# Patient Record
Sex: Female | Born: 1935 | Race: White | Hispanic: No | Smoking: Former smoker
Health system: Southern US, Community
[De-identification: ages and names within clinical notes are randomized; demographics above are authoritative.]

## PROBLEM LIST (undated history)

## (undated) DIAGNOSIS — K219 Gastro-esophageal reflux disease without esophagitis: Secondary | ICD-10-CM

## (undated) DIAGNOSIS — I10 Essential (primary) hypertension: Secondary | ICD-10-CM

## (undated) DIAGNOSIS — J449 Chronic obstructive pulmonary disease, unspecified: Secondary | ICD-10-CM

## (undated) DIAGNOSIS — D649 Anemia, unspecified: Secondary | ICD-10-CM

## (undated) DIAGNOSIS — K625 Hemorrhage of anus and rectum: Secondary | ICD-10-CM

## (undated) DIAGNOSIS — K5792 Diverticulitis of intestine, part unspecified, without perforation or abscess without bleeding: Secondary | ICD-10-CM

## (undated) DIAGNOSIS — E785 Hyperlipidemia, unspecified: Secondary | ICD-10-CM

## (undated) DIAGNOSIS — E611 Iron deficiency: Secondary | ICD-10-CM

## (undated) DIAGNOSIS — L570 Actinic keratosis: Secondary | ICD-10-CM

## (undated) DIAGNOSIS — C4490 Unspecified malignant neoplasm of skin, unspecified: Secondary | ICD-10-CM

## (undated) DIAGNOSIS — H409 Unspecified glaucoma: Secondary | ICD-10-CM

## (undated) HISTORY — PX: JOINT REPLACEMENT: SHX530

## (undated) HISTORY — DX: Unspecified malignant neoplasm of skin, unspecified: C44.90

## (undated) HISTORY — PX: APPENDECTOMY: SHX54

## (undated) HISTORY — PX: ABDOMINAL HYSTERECTOMY: SHX81

## (undated) HISTORY — DX: Actinic keratosis: L57.0

---

## 2010-07-18 DIAGNOSIS — K5730 Diverticulosis of large intestine without perforation or abscess without bleeding: Secondary | ICD-10-CM | POA: Insufficient documentation

## 2010-07-18 DIAGNOSIS — G609 Hereditary and idiopathic neuropathy, unspecified: Secondary | ICD-10-CM | POA: Insufficient documentation

## 2010-07-18 DIAGNOSIS — H8109 Meniere's disease, unspecified ear: Secondary | ICD-10-CM | POA: Insufficient documentation

## 2010-07-18 DIAGNOSIS — K259 Gastric ulcer, unspecified as acute or chronic, without hemorrhage or perforation: Secondary | ICD-10-CM | POA: Insufficient documentation

## 2010-07-18 DIAGNOSIS — K649 Unspecified hemorrhoids: Secondary | ICD-10-CM | POA: Insufficient documentation

## 2010-07-18 DIAGNOSIS — M159 Polyosteoarthritis, unspecified: Secondary | ICD-10-CM | POA: Insufficient documentation

## 2011-02-10 DIAGNOSIS — M797 Fibromyalgia: Secondary | ICD-10-CM | POA: Insufficient documentation

## 2011-04-11 DIAGNOSIS — IMO0002 Reserved for concepts with insufficient information to code with codable children: Secondary | ICD-10-CM | POA: Insufficient documentation

## 2011-07-10 DIAGNOSIS — F3341 Major depressive disorder, recurrent, in partial remission: Secondary | ICD-10-CM | POA: Insufficient documentation

## 2011-11-15 DIAGNOSIS — M2010 Hallux valgus (acquired), unspecified foot: Secondary | ICD-10-CM | POA: Insufficient documentation

## 2011-11-15 DIAGNOSIS — M2040 Other hammer toe(s) (acquired), unspecified foot: Secondary | ICD-10-CM | POA: Insufficient documentation

## 2011-11-15 DIAGNOSIS — M21379 Foot drop, unspecified foot: Secondary | ICD-10-CM | POA: Insufficient documentation

## 2011-11-21 DIAGNOSIS — H3530 Unspecified macular degeneration: Secondary | ICD-10-CM | POA: Insufficient documentation

## 2012-01-18 DIAGNOSIS — M1710 Unilateral primary osteoarthritis, unspecified knee: Secondary | ICD-10-CM | POA: Insufficient documentation

## 2012-01-25 DIAGNOSIS — Z96659 Presence of unspecified artificial knee joint: Secondary | ICD-10-CM | POA: Insufficient documentation

## 2012-01-25 DIAGNOSIS — Z96649 Presence of unspecified artificial hip joint: Secondary | ICD-10-CM | POA: Insufficient documentation

## 2012-02-12 DIAGNOSIS — R5383 Other fatigue: Secondary | ICD-10-CM | POA: Insufficient documentation

## 2012-12-26 DIAGNOSIS — H40009 Preglaucoma, unspecified, unspecified eye: Secondary | ICD-10-CM | POA: Insufficient documentation

## 2015-08-10 ENCOUNTER — Inpatient Hospital Stay
Admission: EM | Admit: 2015-08-10 | Discharge: 2015-08-13 | DRG: 378 | Disposition: A | Discharge status: Home / Self Care | Payer: Medicare Other | Attending: Internal Medicine | Admitting: Internal Medicine

## 2015-08-10 DIAGNOSIS — J449 Chronic obstructive pulmonary disease, unspecified: Secondary | ICD-10-CM | POA: Diagnosis present

## 2015-08-10 DIAGNOSIS — H409 Unspecified glaucoma: Secondary | ICD-10-CM | POA: Diagnosis present

## 2015-08-10 DIAGNOSIS — Z7951 Long term (current) use of inhaled steroids: Secondary | ICD-10-CM

## 2015-08-10 DIAGNOSIS — Z90710 Acquired absence of both cervix and uterus: Secondary | ICD-10-CM

## 2015-08-10 DIAGNOSIS — E785 Hyperlipidemia, unspecified: Secondary | ICD-10-CM | POA: Diagnosis present

## 2015-08-10 DIAGNOSIS — D62 Acute posthemorrhagic anemia: Secondary | ICD-10-CM | POA: Diagnosis present

## 2015-08-10 DIAGNOSIS — I10 Essential (primary) hypertension: Secondary | ICD-10-CM | POA: Diagnosis present

## 2015-08-10 DIAGNOSIS — Z86010 Personal history of colonic polyps: Secondary | ICD-10-CM

## 2015-08-10 DIAGNOSIS — Z79899 Other long term (current) drug therapy: Secondary | ICD-10-CM

## 2015-08-10 DIAGNOSIS — Z9049 Acquired absence of other specified parts of digestive tract: Secondary | ICD-10-CM

## 2015-08-10 DIAGNOSIS — K219 Gastro-esophageal reflux disease without esophagitis: Secondary | ICD-10-CM | POA: Diagnosis present

## 2015-08-10 DIAGNOSIS — Z87891 Personal history of nicotine dependence: Secondary | ICD-10-CM

## 2015-08-10 DIAGNOSIS — K921 Melena: Secondary | ICD-10-CM | POA: Diagnosis not present

## 2015-08-10 DIAGNOSIS — Z888 Allergy status to other drugs, medicaments and biological substances status: Secondary | ICD-10-CM

## 2015-08-10 DIAGNOSIS — E876 Hypokalemia: Secondary | ICD-10-CM | POA: Diagnosis present

## 2015-08-10 DIAGNOSIS — Z886 Allergy status to analgesic agent status: Secondary | ICD-10-CM

## 2015-08-10 DIAGNOSIS — Z8249 Family history of ischemic heart disease and other diseases of the circulatory system: Secondary | ICD-10-CM

## 2015-08-10 DIAGNOSIS — K922 Gastrointestinal hemorrhage, unspecified: Secondary | ICD-10-CM | POA: Diagnosis present

## 2015-08-10 DIAGNOSIS — D500 Iron deficiency anemia secondary to blood loss (chronic): Secondary | ICD-10-CM | POA: Diagnosis present

## 2015-08-10 DIAGNOSIS — D649 Anemia, unspecified: Secondary | ICD-10-CM

## 2015-08-10 DIAGNOSIS — R140 Abdominal distension (gaseous): Secondary | ICD-10-CM

## 2015-08-10 DIAGNOSIS — K319 Disease of stomach and duodenum, unspecified: Secondary | ICD-10-CM | POA: Diagnosis present

## 2015-08-10 DIAGNOSIS — Z8711 Personal history of peptic ulcer disease: Secondary | ICD-10-CM

## 2015-08-10 DIAGNOSIS — Z96641 Presence of right artificial hip joint: Secondary | ICD-10-CM | POA: Diagnosis present

## 2015-08-10 HISTORY — DX: Unspecified glaucoma: H40.9

## 2015-08-10 HISTORY — DX: Hemorrhage of anus and rectum: K62.5

## 2015-08-10 HISTORY — DX: Chronic obstructive pulmonary disease, unspecified: J44.9

## 2015-08-10 HISTORY — DX: Gastro-esophageal reflux disease without esophagitis: K21.9

## 2015-08-10 HISTORY — DX: Hyperlipidemia, unspecified: E78.5

## 2015-08-10 HISTORY — DX: Diverticulitis of intestine, part unspecified, without perforation or abscess without bleeding: K57.92

## 2015-08-10 HISTORY — DX: Essential (primary) hypertension: I10

## 2015-08-10 LAB — PROTIME-INR
INR: 1
Prothrombin Time: 13.2 seconds (ref 11.4–15.2)

## 2015-08-10 LAB — ABO/RH: ABO/RH(D): O NEG

## 2015-08-10 LAB — CBC WITH DIFFERENTIAL/PLATELET
BASOS ABS: 0.3 10*3/uL — AB (ref 0–0.1)
BASOS PCT: 3 %
Eosinophils Absolute: 0.2 10*3/uL (ref 0–0.7)
Eosinophils Relative: 3 %
HEMATOCRIT: 18.7 % — AB (ref 35.0–47.0)
HEMOGLOBIN: 5.9 g/dL — AB (ref 12.0–16.0)
Lymphocytes Relative: 15 %
Lymphs Abs: 1.5 10*3/uL (ref 1.0–3.6)
MCH: 23.9 pg — ABNORMAL LOW (ref 26.0–34.0)
MCHC: 31.8 g/dL — ABNORMAL LOW (ref 32.0–36.0)
MCV: 75.2 fL — ABNORMAL LOW (ref 80.0–100.0)
Monocytes Absolute: 1.3 10*3/uL — ABNORMAL HIGH (ref 0.2–0.9)
Monocytes Relative: 14 %
NEUTROS ABS: 6.5 10*3/uL (ref 1.4–6.5)
NEUTROS PCT: 65 %
Platelets: 329 10*3/uL (ref 150–440)
RBC: 2.49 MIL/uL — ABNORMAL LOW (ref 3.80–5.20)
RDW: 20.7 % — AB (ref 11.5–14.5)
WBC: 9.8 10*3/uL (ref 3.6–11.0)

## 2015-08-10 LAB — COMPREHENSIVE METABOLIC PANEL
ALBUMIN: 3.8 g/dL (ref 3.5–5.0)
ALK PHOS: 37 U/L — AB (ref 38–126)
ALT: 17 U/L (ref 14–54)
AST: 27 U/L (ref 15–41)
Anion gap: 10 (ref 5–15)
BILIRUBIN TOTAL: 0.3 mg/dL (ref 0.3–1.2)
BUN: 33 mg/dL — AB (ref 6–20)
CO2: 26 mmol/L (ref 22–32)
Calcium: 9 mg/dL (ref 8.9–10.3)
Chloride: 100 mmol/L — ABNORMAL LOW (ref 101–111)
Creatinine, Ser: 0.99 mg/dL (ref 0.44–1.00)
GFR calc Af Amer: 57 mL/min — ABNORMAL LOW (ref >60)
GFR calc non Af Amer: 49 mL/min — ABNORMAL LOW (ref >60)
GLUCOSE: 115 mg/dL — AB (ref 65–99)
POTASSIUM: 3.4 mmol/L — AB (ref 3.5–5.1)
Sodium: 136 mmol/L (ref 135–145)
TOTAL PROTEIN: 6.5 g/dL (ref 6.5–8.1)

## 2015-08-10 LAB — PREPARE RBC (CROSSMATCH)

## 2015-08-10 LAB — LIPASE, BLOOD: Lipase: 33 U/L (ref 11–51)

## 2015-08-10 LAB — APTT: APTT: 29 s (ref 24–36)

## 2015-08-10 MED ORDER — SODIUM CHLORIDE 0.9 % IV SOLN
8.0000 mg/h | INTRAVENOUS | Status: DC
  Administered 2015-08-10 – 2015-08-12 (×4): 8 mg/h via INTRAVENOUS
  Filled 2015-08-10 (×5): qty 80

## 2015-08-10 MED ORDER — PANTOPRAZOLE SODIUM 40 MG IV SOLR
80.0000 mg | Freq: Once | INTRAVENOUS | Status: AC
  Administered 2015-08-10: 80 mg via INTRAVENOUS
  Filled 2015-08-10: qty 80

## 2015-08-10 MED ORDER — SODIUM CHLORIDE 0.9 % IV SOLN
10.0000 mL/h | Freq: Once | INTRAVENOUS | Status: AC
  Administered 2015-08-10: 10 mL/h via INTRAVENOUS

## 2015-08-10 MED ORDER — PANTOPRAZOLE SODIUM 40 MG IV SOLR
INTRAVENOUS | Status: AC
  Administered 2015-08-10: 80 mg via INTRAVENOUS
  Filled 2015-08-10: qty 40

## 2015-08-10 MED ORDER — SODIUM CHLORIDE 0.9 % IV BOLUS (SEPSIS)
500.0000 mL | Freq: Once | INTRAVENOUS | Status: AC
  Administered 2015-08-10: 500 mL via INTRAVENOUS

## 2015-08-10 MED ORDER — SODIUM CHLORIDE 0.9 % IV SOLN: 80.0000 mg | Freq: Once | INTRAVENOUS | Status: DC

## 2015-08-10 NOTE — ED Provider Notes (Signed)
[facility] Emergency Department Provider Note   ____________________________________________   First MD Initiated Contact with Patient 08/10/15 2116     (approximate)  I have reviewed the triage vital signs and the nursing notes.   HISTORY  Chief Complaint Rectal Bleeding    HPI [name] [name] is a 82 y.o. female with history of hypertension, COPD, previous GI bleed, known peptic ulcer disease who presents for evaluation of dark tarry stools today, gradual onset, constant, moderate to severe, no modifying factors. She has also had exertional dyspnea and generalized weakness. No abdominal pain, no vomiting or hematemesis. No Fevers or chills. She has required PRBC transfusion in the past for GI bleed. She also requires periodic iron infusions.   Past Medical History:  Diagnosis Date  . COPD (chronic obstructive pulmonary disease) ([place])   . Diverticulitis   . Glaucoma   . Hypertension   . Rectal bleeding     There are no active problems to display for this patient.   Past Surgical History:  Procedure Laterality Date  . ABDOMINAL HYSTERECTOMY    . APPENDECTOMY    . JOINT REPLACEMENT      Prior to Admission medications   Not on File    Allergies Codeine; Dextrans; Fentanyl; Lipitor [atorvastatin]; Mobic [meloxicam]; Niacin and related; Nsaids; Pravachol [pravastatin sodium]; Voltaren [diclofenac sodium]; and Zetia [ezetimibe]  Family History  Problem Relation Age of Onset  . Family history unknown: Yes    Social History Social History  Substance Use Topics  . Smoking status: Former Research scientist (life sciences)  . Smokeless tobacco: Never Used  . Alcohol use No    Review of Systems Constitutional: No fever/chills Eyes: No visual changes. ENT: No sore throat. Cardiovascular: Denies chest pain. Respiratory: Denies shortness of breath. Gastrointestinal: No abdominal pain.  No nausea, no vomiting.  + diarrhea.  No constipation. Genitourinary: Negative  for dysuria. Musculoskeletal: Negative for back pain. Skin: Negative for rash. Neurological: Negative for headaches, focal weakness or numbness.  10-point ROS otherwise negative.  ____________________________________________   PHYSICAL EXAM:  Vitals:   08/10/15 2132  BP: (!) 148/55  Pulse: 77  Resp: (!) 22  Temp: 98.3 F (36.8 C)  TempSrc: Oral  SpO2: 99%  Weight: 175 lb 8 oz (79.6 kg)  Height: 5\' 9"  (1.753 m)     Constitutional: Alert and oriented. Nontoxic but pale- appearing and in no acute distress. Eyes: Conjunctivae are normal. PERRL. EOMI. Head: Atraumatic. Nose: No congestion/rhinnorhea. Mouth/Throat: Mucous membranes are moist.  Oropharynx non-erythematous. Neck: No stridor.  Supple without meningismus. Cardiovascular: Normal rate, regular rhythm. Grossly normal heart sounds.  Good peripheral circulation. Respiratory: Normal respiratory effort.  No retractions. Lungs CTAB. Gastrointestinal: Soft and nontender. No distention.  No CVA tenderness. Genitourinary: deferred Rectal: Melena in the rectal vault is strongly guaiac positive. Musculoskeletal: No lower extremity tenderness nor edema.  No joint effusions. Neurologic:  Normal speech and language. No gross focal neurologic deficits are appreciated. No gait instability. Skin:  Skin is warm, dry and intact. No rash noted. Psychiatric: Mood and affect are normal. Speech and behavior are normal.  ____________________________________________   LABS (all labs ordered are listed, but only abnormal results are displayed)  Labs Reviewed  CBC WITH DIFFERENTIAL/PLATELET - Abnormal; Notable for the following:       Result Value   RBC 2.49 (*)    Hemoglobin 5.9 (*)    HCT 18.7 (*)    MCV 75.2 (*)    MCH 23.9 (*)    MCHC  31.8 (*)    RDW 20.7 (*)    Monocytes Absolute 1.3 (*)    Basophils Absolute 0.3 (*)    All other components within normal limits  PROTIME-INR  APTT  COMPREHENSIVE METABOLIC PANEL  LIPASE,  BLOOD  TYPE AND SCREEN  PREPARE RBC (CROSSMATCH)  ABO/RH   ____________________________________________  EKG  ED ECG REPORT I, Joanne Gavel, the attending physician, personally viewed and interpreted this ECG.   Date: 08/10/2015  EKG Time: 22:32  Rate: 89  Rhythm: normal EKG, normal sinus rhythm  Axis: normal  Intervals:none  ST&T Change: No acute ST elevation or acute ST depression  ____________________________________________  RADIOLOGY  none ____________________________________________   PROCEDURES  Procedure(s) performed: None  Procedures  Critical Care performed: Yes, see critical care note(s).  CRITICAL CARE Performed by: Loura Pardon A   Total critical care time: 35 minutes  Critical care time was exclusive of separately billable procedures and treating other patients.  Critical care was necessary to treat or prevent imminent or life-threatening deterioration.  Critical care was time spent personally by me on the following activities: development of treatment plan with patient and/or surrogate as well as nursing, discussions with consultants, evaluation of patient's response to treatment, examination of patient, obtaining history from patient or surrogate, ordering and performing treatments and interventions, ordering and review of laboratory studies, ordering and review of radiographic studies, pulse oximetry and re-evaluation of patient's condition.  ____________________________________________   INITIAL IMPRESSION / ASSESSMENT AND PLAN / ED COURSE  Pertinent labs & imaging results that were available during my care of the patient were reviewed by me and considered in my medical decision making (see chart for details).  [name] [name] is a 82 y.o. female with history of hypertension, COPD, previous GI bleed, known peptic ulcer disease who presents for evaluation of dark tarry stools today concerning for upper GI bleed, history of same. On exam, she  is nontoxic appearing and in no acute distress though she does have marked skin pallor. Her vital signs are stable, she is afebrile. She has a benign abdominal examination, melena in the rectal vault is strongly guaiac positive. Hemoglobin 5.9, patient has been consented for blood and will receive 2 units of PRBCs. We discussed risks and benefits and she has signed consent form, she wishes to move forward with blood transfusion. IV Protonix bolus and drip initiated, will discuss the case with the hospitalist for admission.  Clinical Course     ____________________________________________   FINAL CLINICAL IMPRESSION(S) / ED DIAGNOSES  Final diagnoses:  Symptomatic anemia  Acute upper GI bleed      NEW MEDICATIONS STARTED DURING THIS VISIT:  New Prescriptions   No medications on file     Note:  This document was prepared using Dragon voice recognition software and may include unintentional dictation errors.    Joanne Gavel, MD 08/10/15 [phone]

## 2015-08-10 NOTE — ED Triage Notes (Signed)
Pt to ED c/o rectal bleeding. Per EMS pt reports 3 episodes of rectal bleeding in the past week with 2 episodes today. Pt reports black tarry stools and a hx of rectal bleeding. Pt denies pain as well as NVD, but reports dizziness and weakness.

## 2015-08-11 ENCOUNTER — Inpatient Hospital Stay: Payer: Medicare Other

## 2015-08-11 DIAGNOSIS — Z888 Allergy status to other drugs, medicaments and biological substances status: Secondary | ICD-10-CM | POA: Diagnosis not present

## 2015-08-11 DIAGNOSIS — Z90710 Acquired absence of both cervix and uterus: Secondary | ICD-10-CM | POA: Diagnosis not present

## 2015-08-11 DIAGNOSIS — D500 Iron deficiency anemia secondary to blood loss (chronic): Secondary | ICD-10-CM | POA: Diagnosis present

## 2015-08-11 DIAGNOSIS — Z8249 Family history of ischemic heart disease and other diseases of the circulatory system: Secondary | ICD-10-CM | POA: Diagnosis not present

## 2015-08-11 DIAGNOSIS — K219 Gastro-esophageal reflux disease without esophagitis: Secondary | ICD-10-CM | POA: Diagnosis present

## 2015-08-11 DIAGNOSIS — K921 Melena: Secondary | ICD-10-CM | POA: Diagnosis present

## 2015-08-11 DIAGNOSIS — K319 Disease of stomach and duodenum, unspecified: Secondary | ICD-10-CM | POA: Diagnosis present

## 2015-08-11 DIAGNOSIS — Z96641 Presence of right artificial hip joint: Secondary | ICD-10-CM | POA: Diagnosis present

## 2015-08-11 DIAGNOSIS — Z7951 Long term (current) use of inhaled steroids: Secondary | ICD-10-CM | POA: Diagnosis not present

## 2015-08-11 DIAGNOSIS — Z886 Allergy status to analgesic agent status: Secondary | ICD-10-CM | POA: Diagnosis not present

## 2015-08-11 DIAGNOSIS — J449 Chronic obstructive pulmonary disease, unspecified: Secondary | ICD-10-CM | POA: Diagnosis present

## 2015-08-11 DIAGNOSIS — Z87891 Personal history of nicotine dependence: Secondary | ICD-10-CM | POA: Diagnosis not present

## 2015-08-11 DIAGNOSIS — E785 Hyperlipidemia, unspecified: Secondary | ICD-10-CM | POA: Diagnosis present

## 2015-08-11 DIAGNOSIS — D62 Acute posthemorrhagic anemia: Secondary | ICD-10-CM | POA: Diagnosis present

## 2015-08-11 DIAGNOSIS — H409 Unspecified glaucoma: Secondary | ICD-10-CM | POA: Diagnosis present

## 2015-08-11 DIAGNOSIS — Z79899 Other long term (current) drug therapy: Secondary | ICD-10-CM | POA: Diagnosis not present

## 2015-08-11 DIAGNOSIS — Z9049 Acquired absence of other specified parts of digestive tract: Secondary | ICD-10-CM | POA: Diagnosis not present

## 2015-08-11 DIAGNOSIS — E876 Hypokalemia: Secondary | ICD-10-CM | POA: Diagnosis present

## 2015-08-11 DIAGNOSIS — Z86010 Personal history of colonic polyps: Secondary | ICD-10-CM | POA: Diagnosis not present

## 2015-08-11 DIAGNOSIS — I10 Essential (primary) hypertension: Secondary | ICD-10-CM | POA: Diagnosis present

## 2015-08-11 DIAGNOSIS — K922 Gastrointestinal hemorrhage, unspecified: Secondary | ICD-10-CM | POA: Diagnosis present

## 2015-08-11 DIAGNOSIS — Z8711 Personal history of peptic ulcer disease: Secondary | ICD-10-CM | POA: Diagnosis not present

## 2015-08-11 LAB — BASIC METABOLIC PANEL
Anion gap: 8 (ref 5–15)
BUN: 32 mg/dL — AB (ref 6–20)
CALCIUM: 8.3 mg/dL — AB (ref 8.9–10.3)
CO2: 26 mmol/L (ref 22–32)
CREATININE: 0.72 mg/dL (ref 0.44–1.00)
Chloride: 103 mmol/L (ref 101–111)
GFR calc non Af Amer: >60 mL/min (ref >60)
GLUCOSE: 95 mg/dL (ref 65–99)
Potassium: 3.3 mmol/L — ABNORMAL LOW (ref 3.5–5.1)
Sodium: 137 mmol/L (ref 135–145)

## 2015-08-11 LAB — HEMOGLOBIN
Hemoglobin: 8 g/dL — ABNORMAL LOW (ref 12.0–16.0)
Hemoglobin: 8.6 g/dL — ABNORMAL LOW (ref 12.0–16.0)

## 2015-08-11 LAB — IRON AND TIBC
Iron: 17 ug/dL — ABNORMAL LOW (ref 28–170)
SATURATION RATIOS: 4 % — AB (ref 10.4–31.8)
TIBC: 484 ug/dL — ABNORMAL HIGH (ref 250–450)
UIBC: 467 ug/dL

## 2015-08-11 LAB — CBC
HEMATOCRIT: 24.2 % — AB (ref 35.0–47.0)
HEMOGLOBIN: 8.3 g/dL — AB (ref 12.0–16.0)
MCH: 26.1 pg (ref 26.0–34.0)
MCHC: 34.3 g/dL (ref 32.0–36.0)
MCV: 76 fL — AB (ref 80.0–100.0)
PLATELETS: 282 10*3/uL (ref 150–440)
RBC: 3.18 MIL/uL — ABNORMAL LOW (ref 3.80–5.20)
RDW: 19.4 % — ABNORMAL HIGH (ref 11.5–14.5)
WBC: 10 10*3/uL (ref 3.6–11.0)

## 2015-08-11 LAB — FERRITIN: Ferritin: 13 ng/mL (ref 11–307)

## 2015-08-11 MED ORDER — PAROXETINE HCL 20 MG PO TABS
40.0000 mg | ORAL_TABLET | ORAL | Status: DC
  Administered 2015-08-11 – 2015-08-13 (×3): 40 mg via ORAL
  Filled 2015-08-11 (×2): qty 4
  Filled 2015-08-11: qty 2
  Filled 2015-08-11: qty 4

## 2015-08-11 MED ORDER — FLUTICASONE PROPIONATE 50 MCG/ACT NA SUSP: 2.0000 | Freq: Every day | NASAL | Status: DC | PRN

## 2015-08-11 MED ORDER — MORPHINE SULFATE (PF) 2 MG/ML IV SOLN
INTRAVENOUS | Status: AC
  Filled 2015-08-11: qty 1

## 2015-08-11 MED ORDER — TRAMADOL HCL 50 MG PO TABS
50.0000 mg | ORAL_TABLET | Freq: Four times a day (QID) | ORAL | Status: DC | PRN
  Administered 2015-08-11: 04:00:00 50 mg via ORAL
  Filled 2015-08-11: qty 1

## 2015-08-11 MED ORDER — ACETAMINOPHEN 500 MG PO TABS
1000.0000 mg | ORAL_TABLET | Freq: Four times a day (QID) | ORAL | Status: DC | PRN
  Administered 2015-08-11: 11:00:00 1000 mg via ORAL
  Filled 2015-08-11: qty 2

## 2015-08-11 MED ORDER — TIMOLOL MALEATE 0.5 % OP SOLG
1.0000 [drp] | Freq: Every day | OPHTHALMIC | Status: DC
  Administered 2015-08-11 – 2015-08-13 (×3): 1 [drp] via OPHTHALMIC
  Filled 2015-08-11: qty 5

## 2015-08-11 MED ORDER — ALBUTEROL SULFATE (2.5 MG/3ML) 0.083% IN NEBU: 3.0000 mL | INHALATION_SOLUTION | RESPIRATORY_TRACT | Status: DC | PRN

## 2015-08-11 MED ORDER — ONDANSETRON HCL 4 MG/2ML IJ SOLN: 4.0000 mg | Freq: Four times a day (QID) | INTRAMUSCULAR | Status: DC | PRN

## 2015-08-11 MED ORDER — ONDANSETRON HCL 4 MG PO TABS: 4.0000 mg | ORAL_TABLET | Freq: Four times a day (QID) | ORAL | Status: DC | PRN

## 2015-08-11 MED ORDER — SIMETHICONE 80 MG PO CHEW
80.0000 mg | CHEWABLE_TABLET | Freq: Four times a day (QID) | ORAL | Status: DC
  Administered 2015-08-11 – 2015-08-13 (×8): 80 mg via ORAL
  Filled 2015-08-11 (×8): qty 1

## 2015-08-11 MED ORDER — SODIUM CHLORIDE 0.9 % IV SOLN
INTRAVENOUS | Status: AC
  Administered 2015-08-11: 05:00:00 via INTRAVENOUS

## 2015-08-11 MED ORDER — GABAPENTIN 300 MG PO CAPS
300.0000 mg | ORAL_CAPSULE | Freq: Two times a day (BID) | ORAL | Status: DC
  Administered 2015-08-11 – 2015-08-13 (×5): 300 mg via ORAL
  Filled 2015-08-11 (×5): qty 1

## 2015-08-11 MED ORDER — ACETAMINOPHEN 325 MG PO TABS
ORAL_TABLET | ORAL | Status: AC
  Administered 2015-08-11: 650 mg via ORAL
  Filled 2015-08-11: qty 2

## 2015-08-11 MED ORDER — ACETAMINOPHEN 650 MG RE SUPP: 650.0000 mg | Freq: Four times a day (QID) | RECTAL | Status: DC | PRN

## 2015-08-11 MED ORDER — POTASSIUM CHLORIDE CRYS ER 20 MEQ PO TBCR
40.0000 meq | EXTENDED_RELEASE_TABLET | Freq: Once | ORAL | Status: AC
  Administered 2015-08-11: 11:00:00 40 meq via ORAL
  Filled 2015-08-11: qty 2

## 2015-08-11 MED ORDER — ACETAMINOPHEN 325 MG PO TABS
650.0000 mg | ORAL_TABLET | Freq: Four times a day (QID) | ORAL | Status: DC | PRN
  Administered 2015-08-11: 650 mg via ORAL

## 2015-08-11 MED ORDER — MORPHINE SULFATE (PF) 2 MG/ML IV SOLN: 2.0000 mg | Freq: Once | INTRAVENOUS | Status: DC

## 2015-08-11 MED ORDER — SODIUM CHLORIDE 0.9% FLUSH
3.0000 mL | Freq: Two times a day (BID) | INTRAVENOUS | Status: DC
  Administered 2015-08-11 – 2015-08-13 (×6): 3 mL via INTRAVENOUS

## 2015-08-11 NOTE — Progress Notes (Signed)
[place] at [facility]                                                                                                                                                                                            Patient Demographics   [name] [name], is a 82 y.o. female, DOB - 04/17/1925, [id number]  Admit date - 08/10/2015   Admitting Physician Lance Coon, MD  Outpatient Primary MD for the patient is [place], [place], DO   LOS - 0  Subjective:Patient admitted with GI bleed with melena. She reports that she has not had any further bleeding since admission. Does complaint that her abdomen is distended and feels gaseous. Hemoglobin has improved past transfusion     Review of Systems:   CONSTITUTIONAL: No documented fever. No fatigue, weakness. No weight gain, no weight loss.  EYES: No blurry or double vision.  ENT: No tinnitus. No postnasal drip. No redness of the oropharynx.  RESPIRATORY: No cough, no wheeze, no hemoptysis. No dyspnea.  CARDIOVASCULAR: No chest pain. No orthopnea. No palpitations. No syncope.  GASTROINTESTINAL: No nausea, no vomiting or diarrhea. No abdominal pain. Positive melena no hematochezia. Positive abdominal distention GENITOURINARY: No dysuria or hematuria.  ENDOCRINE: No polyuria or nocturia. No heat or cold intolerance.  HEMATOLOGY: No anemia. No bruising. No bleeding.  INTEGUMENTARY: No rashes. No lesions.  MUSCULOSKELETAL: No arthritis. No swelling. No gout.  NEUROLOGIC: No numbness, tingling, or ataxia. No seizure-type activity.  PSYCHIATRIC: No anxiety. No insomnia. No ADD.    Vitals:   Vitals:   08/11/15 0442 08/11/15 0527 08/11/15 0535 08/11/15 0832  BP: (!) 155/65 (!) 173/61 (!) 157/69 (!) 149/59  Pulse: 81 72 78 77  Resp: 17 15  16   Temp: 98.2 F (36.8 C) 97.6 F (36.4 C)  97.9 F (36.6 C)  TempSrc: Oral Oral  Oral  SpO2: 98% 99% 96% 96%  Weight:      Height:        Wt Readings from  Last 3 Encounters:  08/11/15 85.8 kg (189 lb 3.2 oz)     Intake/Output Summary (Last 24 hours) at 08/11/15 1323 Last data filed at 08/11/15 1317  Gross per 24 hour  Intake           1584.5 ml  Output                0 ml  Net           1584.5 ml    Physical Exam:   GENERAL: Pleasant-appearing in no apparent distress.  HEAD, EYES, EARS, NOSE AND THROAT: Atraumatic, normocephalic. Extraocular  muscles are intact. Pupils equal and reactive to light. Sclerae anicteric. No conjunctival injection. No oro-pharyngeal erythema.  NECK: Supple. There is no jugular venous distention. No bruits, no lymphadenopathy, no thyromegaly.  HEART: Regular rate and rhythm,. No murmurs, no rubs, no clicks.  LUNGS: Clear to auscultation bilaterally. No rales or rhonchi. No wheezes.  ABDOMEN: Soft, mild distention, nondistended. Has good bowel sounds. No hepatosplenomegaly appreciated.  EXTREMITIES: No evidence of any cyanosis, clubbing, or peripheral edema.  +2 pedal and radial pulses bilaterally.  NEUROLOGIC: The patient is alert, awake, and oriented x3 with no focal motor or sensory deficits appreciated bilaterally.  SKIN: Moist and warm with no rashes appreciated.  Psych: Not anxious, depressed LN: No inguinal LN enlargement    Antibiotics   Anti-infectives    None      Medications   Scheduled Meds: . PARoxetine  40 mg Oral [id number]  . simethicone  80 mg Oral QID  . sodium chloride flush  3 mL Intravenous Q12H  . timolol  1 drop Both Eyes Daily   Continuous Infusions: . pantoprozole (PROTONIX) infusion 8 mg/hr (08/11/15 0847)   PRN Meds:.[DISCONTINUED] acetaminophen **OR** acetaminophen, acetaminophen, albuterol, ondansetron **OR** ondansetron (ZOFRAN) IV, traMADol   Data Review:   Micro Results No results found for this or any previous visit (from the past 240 hour(s)).  Radiology Reports Dg Abd 2 Views  Result Date: 08/11/2015 CLINICAL DATA:  Melena EXAM: ABDOMEN - 2 VIEW COMPARISON:   None. FINDINGS: Scattered large and small bowel gas noted. Degenerative changes of the lumbar spine are seen with a scoliosis concave to the right. Right hip replacement is noted. Degenerative changes of the left hip joint is seen. No free air is noted. IMPRESSION: No acute abnormality noted. Electronically Signed   By: Inez Catalina M.D.   On: 08/11/2015 11:17     CBC  Recent Labs Lab 08/10/15 2122 08/11/15 0709  WBC 9.8 10.0  HGB 5.9* 8.3*  HCT 18.7* 24.2*  PLT 329 282  MCV 75.2* 76.0*  MCH 23.9* 26.1  MCHC 31.8* 34.3  RDW 20.7* 19.4*  LYMPHSABS 1.5  --   MONOABS 1.3*  --   EOSABS 0.2  --   BASOSABS 0.3*  --     Chemistries   Recent Labs Lab 08/10/15 2122 08/11/15 0709  NA 136 137  K 3.4* 3.3*  CL 100* 103  CO2 26 26  GLUCOSE 115* 95  BUN 33* 32*  CREATININE 0.99 0.72  CALCIUM 9.0 8.3*  AST 27  --   ALT 17  --   ALKPHOS 37*  --   BILITOT 0.3  --    ------------------------------------------------------------------------------------------------------------------ estimated creatinine clearance is 56.8 mL/min (by C-G formula based on SCr of 0.8 mg/dL). ------------------------------------------------------------------------------------------------------------------ No results for input(s): HGBA1C in the last 72 hours. ------------------------------------------------------------------------------------------------------------------ No results for input(s): CHOL, HDL, LDLCALC, TRIG, CHOLHDL, LDLDIRECT in the last 72 hours. ------------------------------------------------------------------------------------------------------------------ No results for input(s): TSH, T4TOTAL, T3FREE, THYROIDAB in the last 72 hours.  Invalid input(s): FREET3 ------------------------------------------------------------------------------------------------------------------ No results for input(s): VITAMINB12, FOLATE, FERRITIN, TIBC, IRON, RETICCTPCT in the last 72 hours.  Coagulation  profile  Recent Labs Lab 08/10/15 2122  INR 1.00    No results for input(s): DDIMER in the last 72 hours.  Cardiac Enzymes No results for input(s): CKMB, TROPONINI, MYOGLOBIN in the last 168 hours.  Invalid input(s): CK ------------------------------------------------------------------------------------------------------------------ Invalid input(s): [place]   Patient is a 82 year old with history of GI bleed in the past  presents with upper GI bleed  1. upper  GI bleed -  Continue IV Protonix drip Hemoglobin is currently stable we'll monitor again transfuse if needed  2. Abdominal distention I will order abdominal x-ray to make sure that she does not have obstruction  3. Hypokalemia I will give her a dose of potassium Pete potassium levels in the morning  4. Acute blood loss anemia status post transfusion  5. Essential   HTN (hypertension) - currently normotensive, low pressure medications being mild   6.   COPD (chronic obstructive pulmonary disease) ([place]) - continue home inhalers no evidence of exasperation  7.  HLD (hyperlipidemia) -  add on any therapy continue to monitor  8.  GERD (gastroesophageal reflux disease) - PPI as above      Code Status Orders        Start     Ordered   08/11/15 0142  Full code  Continuous     08/11/15 0141    Code Status History    Date Active Date Inactive Code Status Order ID Comments User Context   This patient has a current code status but no historical code status.    Advance Directive Documentation   Flowsheet Row Most Recent Value  Type of Advance Directive  Healthcare Power of Attorney, Living will  Pre-existing out of facility DNR order (yellow form or pink MOST form)  No data  "MOST" Form in Place?  No data           ConsultsGastroenterology   DVT Prophylaxis  SCDs  Lab Results  Component Value Date   PLT 282 08/11/2015     Time Spent in minutes 76min  Greater than 50% of  time spent in care coordination and counseling patient regarding the condition and plan of care.   Dustin Flock M.D on 08/11/2015 at 1:23 PM  Between 7am to 6pm - Pager - [phone]  After 6pm go to [email or web address] - password [email or web address] [facility]   Office  [phone]

## 2015-08-11 NOTE — Progress Notes (Signed)
Pt doing well not stools today thus far. Up with physical therapy. Tolerating clear liquids. Complains of chronic back pain otherwise which she takes tylenol for with good relief, otherwise comfortable. Plan to discharge tomorrow.

## 2015-08-11 NOTE — Care Management (Signed)
Admitted to [facility] with the diagnosis of GI bleed. Lives at [facility] x 2 years. Prior to [facility] lived in [place]. Last seen Dr. Bobette Mo at [facility] 2 months ago. Daughter is Clarise Cruz [phone]). No home Health. No skilled facility. No home oxygen. Uses a cane to aid in ambulation. [place] last Spring down on one knee in the yard. Appetite too good. Takes care of all basic and instrumental activities of daily living herself, drives. Prescriptions are filled at [facility]. Son-in-law Shanon Brow will transport. Shelbie Ammons RN MSN CCM Care Management [phone]

## 2015-08-11 NOTE — Care Management Important Message (Signed)
Important Message  Patient Details  Name: Jhanvi Szuch MRN: [id number] Date of Birth: 1925-01-26   Medicare Important Message Given:       Shelbie Ammons, RN 08/11/2015, 8:29 AM

## 2015-08-11 NOTE — Consult Note (Signed)
Consultation  Referring Provider:     Dr Posey Pronto Admit date: 08/11/15 Consult date: 08/11/15         Reason for Consultation:   Melena        HPI:   [name] [name] is a 82 y.o. female with history of IDA, COPD, GERD, HLD, htn who was admitted with dark tarry stools and symptomatic anemia (DOE/weakness). She is not on chronic anticoagulants. There has been some epigastric pain reported.  Hgb found to be 5.9  and was found to be heme + on rectal exam in ED. Bun was elevated with normal creatinine. Pt/INR 13.2/1.00. Started on Pantoprazole gtt and received 2u PRBC transfusion with hgb now 8.3. 2 view abdominal films today unremarkable.  Patient reports a long history of IDA and regular Fe transfusions: follows with Dr Charyl Dancer at [facility]. IDA improved over the winter however, had hgb drop form 12 to 7 this summer: had IV Fe last about 3w ago. States she saw a black stool about a week ago but this did not recur until 2d ago and was associated with epigastric pain and abdominal bloating. Felt her symptoms were consistent with the ulcer she had in the past, which was related to NSAIDS, which she states she no longer takes. Seems to think that this is a chronic ulcer, however last EGD 4/16 as below. Denies all further GI complaints. States she is feeling better today after transfusions and has no further pain ,however still feels malaised. Is on PPI at home. States she has not had any further melena since yesterday and has not moved her bowels today. She is hemodynamically stable.    Follows with [facility]: sees Dr Rosanne Sack, last 9/16- a capsule study was discussed but both patient and provider decided not to do it as the chances of it finding something significant was small and she did not want to undergo the risk, and did not want to have any operation.  She was going to follow up prn. Endoscopies as below:             CT colonography 05/22/14 with diverticulosis to the descending and sigmoid colon. There  were no polyps os masses. There were some mildly enlarged mesenteric root lymphnodes, which was noted to be nonspecific. EGD 04/16/14 done by Dr Bayard Hugger at [facility] with some mild gastric erosions, small hiatal hernia, normal esohagus and duodenum.  Duodenal biopsies negative, gastric biopsies with foveolar hyperplasia: there was no gastritis/h pylori/dysplasia/malignancy. Colonoscopy 12/ 2012 (Dr Boyd Kerbs): passed only to the sigmoid colon due to anatomical issues. There were several diverticula and no polyps. EGD 12/2010(Dr Genezcko):Esophagus, stomach, and duodenum all noted to be normal.   EGD 2006 (Dr Rosanne Sack): single gastric ulcer, 45mm in the prepyloric area. Esophagus and duodenum normal. There was a note that she was treated for H pylori at some point around this however biopsies of this ulcer were negative for H pylori, dysplasia, malignancy.  Negative celiac test 2006. Colonoscopy 2004 (Dr Rosanne Sack) with diverticulosis Colonoscopy 1995 (Dr Geoffry Paradise) with a hyperplastic polyp.   Past Medical History:  Diagnosis Date  . COPD (chronic obstructive pulmonary disease) ([place])   . Diverticulitis   . GERD (gastroesophageal reflux disease)   . Glaucoma   . HLD (hyperlipidemia)   . Hypertension   . Rectal bleeding     Past Surgical History:  Procedure Laterality Date  . ABDOMINAL HYSTERECTOMY    . APPENDECTOMY    . JOINT REPLACEMENT      Family History  Problem Relation Age of Onset  . CAD Mother   . CAD Father   . CAD Brother    son with colon cancer; no family history liver disease/ulcers.  Social History  Substance Use Topics  . Smoking status: Former Research scientist (life sciences)  . Smokeless tobacco: Never Used  . Alcohol use No    Prior to Admission medications   Medication Sig Start Date End Date Taking? Authorizing Provider  acetaminophen (TYLENOL) 500 MG tablet Take 500 mg by mouth every 6 (six) hours as needed.   Yes Historical Provider, MD  albuterol (PROVENTIL HFA;VENTOLIN HFA) 108 (90 Base)  MCG/ACT inhaler Inhale 2 puffs into the lungs every 4 (four) hours as needed for wheezing or shortness of breath.   Yes Historical Provider, MD  chlorthalidone (HYGROTON) 25 MG tablet Take 12.5 mg by mouth daily.   Yes Historical Provider, MD  cholecalciferol (VITAMIN D) 1000 units tablet Take 1,000 Units by mouth 2 (two) times daily.   Yes Historical Provider, MD  cycloSPORINE (RESTASIS) 0.05 % ophthalmic emulsion Place 1 drop into both eyes 2 (two) times daily.   Yes Historical Provider, MD  fluticasone (FLONASE) 50 MCG/ACT nasal spray Place 2 sprays into both nostrils daily as needed for allergies or rhinitis.   Yes Historical Provider, MD  gabapentin (NEURONTIN) 300 MG capsule Take 300 mg by mouth 2 (two) times daily.   Yes Historical Provider, MD  Javier Docker Oil 500 MG CAPS Take 500 mg by mouth daily.   Yes Historical Provider, MD  losartan (COZAAR) 50 MG tablet Take 50 mg by mouth daily.   Yes Historical Provider, MD  mometasone (ELOCON) 0.1 % lotion Apply 1 application topically daily as needed (rash).   Yes Historical Provider, MD  Multiple Vitamin (MULTIVITAMIN WITH MINERALS) TABS tablet Take 1 tablet by mouth daily.   Yes Historical Provider, MD  Multiple Vitamins-Minerals (OCUVITE PRESERVISION PO) Take 1 tablet by mouth 2 (two) times daily.   Yes Historical Provider, MD  nystatin (NYSTATIN) powder Apply 1 g topically 2 (two) times daily as needed (rash).   Yes Historical Provider, MD  omeprazole (PRILOSEC) 40 MG capsule Take 40 mg by mouth daily.   Yes Historical Provider, MD  PARoxetine (PAXIL) 40 MG tablet Take 40 mg by mouth every morning.   Yes Historical Provider, MD  Polyethyl Glycol-Propyl Glycol (SYSTANE) 0.4-0.3 % SOLN Apply 1 drop to eye as needed.   Yes Historical Provider, MD  potassium chloride (K-DUR,KLOR-CON) 10 MEQ tablet Take 10 mEq by mouth 2 (two) times daily.   Yes Historical Provider, MD  timolol (TIMOPTIC-XR) 0.5 % ophthalmic gel-forming Place 1 drop into both eyes daily.    Yes Historical Provider, MD  acetaminophen (TYLENOL) 500 MG tablet Take 500-1,000 mg by mouth every 6 (six) hours as needed for mild pain.    Historical Provider, MD  valACYclovir (VALTREX) 1000 MG tablet Take 1,000 mg by mouth daily as needed (fever blister).    Historical Provider, MD    Current Facility-Administered Medications  Medication Dose Route Frequency Provider Last Rate Last Dose  . acetaminophen (TYLENOL) suppository 650 mg  650 mg Rectal Q6H PRN Lance Coon, MD      . acetaminophen (TYLENOL) tablet 1,000 mg  1,000 mg Oral Q6H PRN Harrie Foreman, MD   1,000 mg at 08/11/15 1115  . albuterol (PROVENTIL) (2.5 MG/3ML) 0.083% nebulizer solution 3 mL  3 mL Inhalation Q4H PRN Lance Coon, MD      . ondansetron [facility]) tablet 4 mg  4 mg Oral Q6H PRN Lance Coon, MD       Or  . ondansetron [facility]) injection 4 mg  4 mg Intravenous Q6H PRN Lance Coon, MD      . pantoprazole (PROTONIX) 80 mg in sodium chloride 0.9 % 250 mL (0.32 mg/mL) infusion  8 mg/hr Intravenous Continuous Joanne Gavel, MD 25 mL/hr at 08/11/15 0847 8 mg/hr at 08/11/15 0847  . PARoxetine (PAXIL) tablet 40 mg  40 mg Oral Brigid Re, MD   40 mg at 08/11/15 0846  . simethicone (MYLICON) chewable tablet 80 mg  80 mg Oral QID Dustin Flock, MD   80 mg at 08/11/15 1114  . sodium chloride flush (NS) 0.9 % injection 3 mL  3 mL Intravenous Q12H Lance Coon, MD   3 mL at 08/11/15 0848  . timolol (TIMOPTIC-XR) 0.5 % ophthalmic gel-forming 1 drop  1 drop Both Eyes Daily Lance Coon, MD   1 drop at 08/11/15 1122  . traMADol (ULTRAM) tablet 50-100 mg  50-100 mg Oral Q6H PRN Harrie Foreman, MD   50 mg at 08/11/15 0355    Allergies as of 08/10/2015 - Review Complete 08/10/2015  Allergen Reaction Noted  . Codeine Nausea And Vomiting 08/10/2015  . Dextrans Hives 08/10/2015  . Fentanyl Itching 08/10/2015  . Lipitor [atorvastatin] Other (See Comments) 08/10/2015  . Mobic [meloxicam] Other (See Comments)  08/10/2015  . Niacin and related Itching 08/10/2015  . Nsaids Hives 08/10/2015  . Pravachol [pravastatin sodium] Other (See Comments) 08/10/2015  . Voltaren [diclofenac sodium] Other (See Comments) 08/10/2015  . Zetia [ezetimibe] Other (See Comments) 08/10/2015     Review of Systems:    All systems reviewed and negative except where noted in HPI.      Physical Exam:  Vital signs in last 24 hours: Temp:  [97.6 F (36.4 C)-98.3 F (36.8 C)] 97.9 F (36.6 C) (08/02 0832) Pulse Rate:  [72-83] 77 (08/02 0832) Resp:  [10-22] 16 (08/02 0832) BP: (124-173)/(50-82) 149/59 (08/02 0832) SpO2:  [96 %-100 %] 96 % (08/02 0832) Weight:  [79.6 kg (175 lb 8 oz)-85.8 kg (189 lb 3.2 oz)] 85.8 kg (189 lb 3.2 oz) (08/02 0149) Last BM Date: 08/10/15 General:   Pleasant woman in NAD Head:  Normocephalic and atraumatic. Eyes:   No icterus.   Conjunctiva pink. Ears:  Hoh. Better with her hearing aids in. Mouth: Mucosa pink moist, no lesions. Neck:  Supple; no masses felt Lungs:  Respirations even and unlabored. Lungs clear to auscultation bilaterally.   No wheezes, crackles, or rhonchi.  Heart:  S1S2, RRR, no MRG. No edema. Abdomen:   Flat, soft, nondistended, nontender. Normal bowel sounds. No appreciable masses or hepatomegaly. No rebound signs or other peritoneal signs. Rectal:  Stool dark brown, heme positive. Several external hemorrhoids.  Msk:  MAEW x4, No clubbing or cyanosis. Strength 5/5. Symmetrical without gross deformities. Neurologic:  Alert and  oriented x4;  Cranial nerves II-XII intact.  Skin:  Warm, dry, pink without significant lesions or rashes. Psych:  Alert and cooperative. Normal affect.  LAB RESULTS:  Recent Labs  08/10/15 2122 08/11/15 0709  WBC 9.8 10.0  HGB 5.9* 8.3*  HCT 18.7* 24.2*  PLT 329 282   BMET  Recent Labs  08/10/15 2122 08/11/15 0709  NA 136 137  K 3.4* 3.3*  CL 100* 103  CO2 26 26  GLUCOSE 115* 95  BUN 33* 32*  CREATININE 0.99 0.72   CALCIUM 9.0 8.3*   LFT  Recent Labs  08/10/15 2122  PROT 6.5  ALBUMIN 3.8  AST 27  ALT 17  ALKPHOS 37*  BILITOT 0.3   PT/INR  Recent Labs  08/10/15 2122  LABPROT 13.2  INR 1.00    STUDIES: Dg Abd 2 Views  Result Date: 08/11/2015 CLINICAL DATA:  Melena EXAM: ABDOMEN - 2 VIEW COMPARISON:  None. FINDINGS: Scattered large and small bowel gas noted. Degenerative changes of the lumbar spine are seen with a scoliosis concave to the right. Right hip replacement is noted. Degenerative changes of the left hip joint is seen. No free air is noted. IMPRESSION: No acute abnormality noted. Electronically Signed   By: Inez Catalina M.D.   On: 08/11/2015 11:17       Impression / Plan:   1. Symptomatic anemia with melena/heme positive stool.  I do not think this is from a chronic gastric ulcer, however she could have some new erosions or some AVMS. Her small intestine has never been evaluated. Will discuss EGD/VCE with Dr Gustavo Lah. Patient is unsure if she wants to have these done or not, given her advanced age. She is concerned about complications from the procedures- so we may just treat her empirically. Agree with Pantoprazole gtt, following hgb and transfusing prn. Will assess for H pylori, although this may be a limited study due to her chronic PPI use.   Thank you very much for this consult. These services were provided by Stephens November, NP-C, in collaboration with Lollie Sails, MD, with whom I have discussed this patient in full.   Stephens November, NP-C Did d/w Dr Gustavo Lah- will add Fe studies for now

## 2015-08-11 NOTE — Consult Note (Signed)
Please see full GI consult by Mrs. London. Patient admitted with symptomatic anemia. She has had black stools. She does have a history of chronic GI blood loss followed at [facility]. He has recently restarted IV iron infusions due to anemia noted there. He states that she had a black stool about a week ago and then several over the past several days. Today she is Hemoccult positive but with brown stool. Her last EGD was about 04/16/2014 showing gastric erosions. She has been on 40 mg of Protonix daily.  Assessment 1 acute on chronic GI blood loss with iron deficiency anemia  Plan 1 iron studies. Will do this on admission blood. 2 continue PPI as you are. 3 Will obtain Helicobacter pylori testing 4 will likely need to be started on Carafate as this may ameliorate any chronic GI blood loss from lesions such as small AVMs or erosive gastropathy. I have discussed the risks benefits complications of this procedure with patient to include not limited to bleeding infection perforation and the risk of sedation and she wishes to proceed.

## 2015-08-11 NOTE — H&P (Signed)
[facility] [place] NAME: [name] [name]    MR#:  [id number]  DATE OF BIRTH:  July 20, 1925  DATE OF ADMISSION:  08/10/2015  PRIMARY CARE PHYSICIAN: Gearldine Shown, DO   REQUESTING/REFERRING PHYSICIAN: Edd Fabian, MD  CHIEF COMPLAINT:   Chief Complaint  Patient presents with  . Rectal Bleeding    HISTORY OF PRESENT ILLNESS:  Serenitie Kamei  is a 82 y.o. female who presents with Several episodes of large volume melena. Patient is a known history of gastric ulcer with GI bleed in the past. She states that she recently had a drop in her hemoglobin from 12 to 7 a few weeks ago and was given IV infusion by her PCP and specialists at [facility]. [facility] did not feel like she warranted endoscopy at that time. Over the past 2-3 days she has had several episodes of melena, with large-volume episode of melena just prior to coming to the ED. He did have an episode of epigastric pain couple of days ago. Her hemoglobin here was found to be around 5. She has symptoms of fatigue and lightheadedness. Blood transfusion was ordered at hospitals were called for admission and further workup.  PAST MEDICAL HISTORY:   Past Medical History:  Diagnosis Date  . COPD (chronic obstructive pulmonary disease) ([place])   . Diverticulitis   . GERD (gastroesophageal reflux disease)   . Glaucoma   . HLD (hyperlipidemia)   . Hypertension   . Rectal bleeding     PAST SURGICAL HISTORY:   Past Surgical History:  Procedure Laterality Date  . ABDOMINAL HYSTERECTOMY    . APPENDECTOMY    . JOINT REPLACEMENT      SOCIAL HISTORY:   Social History  Substance Use Topics  . Smoking status: Former Research scientist (life sciences)  . Smokeless tobacco: Never Used  . Alcohol use No    FAMILY HISTORY:   Family History  Problem Relation Age of Onset  . CAD Mother   . CAD Father   . CAD Brother     DRUG ALLERGIES:   Allergies  Allergen Reactions  . Codeine  Nausea And Vomiting  . Dextrans Hives  . Fentanyl Itching  . Lipitor [Atorvastatin] Other (See Comments)    Reaction: Muscle pain  . Mobic [Meloxicam] Other (See Comments)    Reaction: Mouth and tongue ulcers  . Niacin And Related Itching  . Nsaids Hives  . Pravachol [Pravastatin Sodium] Other (See Comments)    Reaction: Muscle pain  . Voltaren [Diclofenac Sodium] Other (See Comments)    Reaction: Mouth and tongue ulcers  . Zetia [Ezetimibe] Other (See Comments)    Reaction: Leg pain    MEDICATIONS AT HOME:   Prior to Admission medications   Medication Sig Start Date End Date Taking? Authorizing Provider  acetaminophen (TYLENOL) 500 MG tablet Take 500-1,000 mg by mouth every 6 (six) hours as needed for mild pain.   Yes Historical Provider, MD  albuterol (PROVENTIL HFA;VENTOLIN HFA) 108 (90 Base) MCG/ACT inhaler Inhale 2 puffs into the lungs every 4 (four) hours as needed for wheezing or shortness of breath.   Yes Historical Provider, MD  chlorthalidone (HYGROTON) 25 MG tablet Take 12.5 mg by mouth daily.   Yes Historical Provider, MD  cholecalciferol (VITAMIN D) 1000 units tablet Take 1,000 Units by mouth 2 (two) times daily.   Yes Historical Provider, MD  cycloSPORINE (RESTASIS) 0.05 % ophthalmic emulsion Place 1 drop into both eyes 2 (two) times  daily.   Yes Historical Provider, MD  fluticasone (FLONASE) 50 MCG/ACT nasal spray Place 2 sprays into both nostrils daily as needed for allergies or rhinitis.   Yes Historical Provider, MD  gabapentin (NEURONTIN) 300 MG capsule Take 300 mg by mouth 2 (two) times daily.   Yes Historical Provider, MD  Javier Docker Oil 500 MG CAPS Take 500 mg by mouth daily.   Yes Historical Provider, MD  losartan (COZAAR) 50 MG tablet Take 50 mg by mouth daily.   Yes Historical Provider, MD  mometasone (ELOCON) 0.1 % lotion Apply 1 application topically daily as needed (rash).   Yes Historical Provider, MD  Multiple Vitamin (MULTIVITAMIN WITH MINERALS) TABS tablet  Take 1 tablet by mouth daily.   Yes Historical Provider, MD  Multiple Vitamins-Minerals (OCUVITE PRESERVISION PO) Take 1 tablet by mouth 2 (two) times daily.   Yes Historical Provider, MD  nystatin (NYSTATIN) powder Apply 1 g topically 2 (two) times daily as needed (rash).   Yes Historical Provider, MD  omeprazole (PRILOSEC) 40 MG capsule Take 40 mg by mouth daily.   Yes Historical Provider, MD  PARoxetine (PAXIL) 40 MG tablet Take 40 mg by mouth every morning.   Yes Historical Provider, MD  Polyethyl Glycol-Propyl Glycol (SYSTANE) 0.4-0.3 % SOLN Apply 1 drop to eye as needed.   Yes Historical Provider, MD  potassium chloride (K-DUR,KLOR-CON) 10 MEQ tablet Take 10 mEq by mouth 2 (two) times daily.   Yes Historical Provider, MD  timolol (TIMOPTIC-XR) 0.5 % ophthalmic gel-forming Place 1 drop into both eyes daily.   Yes Historical Provider, MD  valACYclovir (VALTREX) 1000 MG tablet Take 1,000 mg by mouth daily as needed (fever blister).   Yes Historical Provider, MD    REVIEW OF SYSTEMS:  Review of Systems  Constitutional: Positive for malaise/fatigue. Negative for chills, fever and weight loss.  HENT: Negative for ear pain, hearing loss and tinnitus.   Eyes: Negative for blurred vision, double vision, pain and redness.  Respiratory: Negative for cough, hemoptysis and shortness of breath.   Cardiovascular: Negative for chest pain, palpitations, orthopnea and leg swelling.  Gastrointestinal: Positive for abdominal pain and melena. Negative for constipation, diarrhea, nausea and vomiting.  Genitourinary: Negative for dysuria, frequency and hematuria.  Musculoskeletal: Negative for back pain, joint pain and neck pain.  Skin:       No acne, rash, or lesions  Neurological: Positive for weakness. Negative for dizziness, tremors and focal weakness.  Endo/Heme/Allergies: Negative for polydipsia. Does not bruise/bleed easily.  Psychiatric/Behavioral: Negative for depression. The patient is not  nervous/anxious and does not have insomnia.      VITAL SIGNS:   Vitals:   08/10/15 2200 08/10/15 2300 08/10/15 2315 08/10/15 2340  BP: 124/64 (!) 130/53 (!) 125/54 (!) 130/57  Pulse: 78 82 79 81  Resp: 19 12 14 19   Temp:   98.2 F (36.8 C) 98.2 F (36.8 C)  TempSrc:   Oral Oral  SpO2: 100% 99% 97%   Weight:      Height:       Wt Readings from Last 3 Encounters:  08/10/15 79.6 kg (175 lb 8 oz)    PHYSICAL EXAMINATION:  Physical Exam  Vitals reviewed. Constitutional: She is oriented to person, place, and time. She appears well-developed and well-nourished. No distress.  HENT:  Head: Normocephalic and atraumatic.  Mouth/Throat: Oropharynx is clear and moist.  Eyes: Conjunctivae and EOM are normal. Pupils are equal, round, and reactive to light. No scleral icterus.  Neck: Normal  range of motion. Neck supple. No JVD present. No thyromegaly present.  Cardiovascular: Normal rate, regular rhythm and intact distal pulses.  Exam reveals no gallop and no friction rub.   Murmur (2/6 systolic murmur) heard. Respiratory: Effort normal and breath sounds normal. No respiratory distress. She has no wheezes. She has no rales.  GI: Soft. Bowel sounds are normal. She exhibits no distension. There is tenderness (epigastric).  Musculoskeletal: Normal range of motion. She exhibits no edema.  No arthritis, no gout  Lymphadenopathy:    She has no cervical adenopathy.  Neurological: She is alert and oriented to person, place, and time. No cranial nerve deficit.  No dysarthria, no aphasia  Skin: Skin is warm and dry. No rash noted. No erythema.  Psychiatric: She has a normal mood and affect. Her behavior is normal. Judgment and thought content normal.    LABORATORY PANEL:   CBC  Recent Labs Lab 08/10/15 2122  WBC 9.8  HGB 5.9*  HCT 18.7*  PLT 329   ------------------------------------------------------------------------------------------------------------------  Chemistries   Recent  Labs Lab 08/10/15 2122  NA 136  K 3.4*  CL 100*  CO2 26  GLUCOSE 115*  BUN 33*  CREATININE 0.99  CALCIUM 9.0  AST 27  ALT 17  ALKPHOS 37*  BILITOT 0.3   ------------------------------------------------------------------------------------------------------------------  Cardiac Enzymes No results for input(s): TROPONINI in the last 168 hours. ------------------------------------------------------------------------------------------------------------------  RADIOLOGY:  No results found.  EKG:   Orders placed or performed during the hospital encounter of 08/10/15  . EKG 12-Lead  . EKG 12-Lead    IMPRESSION AND PLAN:  Principal Problem:   GI bleed - patient very likely has an upper GI bleed, possibly due to gastric ulcer as she has a history of the same. We placed her on PPI drip here, given her blood transfusion.  We will follow serial hemoglobin, and consult gastroenterology. Active Problems:   Symptomatic anemia - transfusion as above, follow serial hemoglobin.   HTN (hypertension) - currently normotensive, holding home antihypertensives for now until her blood pressure becomes elevated.   COPD (chronic obstructive pulmonary disease) ([place]) - continue home inhalers   HLD (hyperlipidemia) - continue home meds   GERD (gastroesophageal reflux disease) - PPI as above  All the records are reviewed and case discussed with ED provider. Management plans discussed with the patient and/or family.  DVT PROPHYLAXIS: Mechanical only  GI PROPHYLAXIS: PPI  ADMISSION STATUS: Inpatient  CODE STATUS: Full Code Status History    This patient does not have a recorded code status. Please follow your organizational policy for patients in this situation.    Advance Directive Documentation   Flowsheet Row Most Recent Value  Type of Advance Directive  Healthcare Power of Attorney, Living will  Pre-existing out of facility DNR order (yellow form or pink MOST form)  No data  "MOST" Form  in Place?  No data      TOTAL TIME TAKING CARE OF THIS PATIENT: 45 minutes.    Shawni Volkov Parks 08/11/2015, 12:27 AM  [facility]  Office  [phone]  CC: Primary care physician; Arrie Aran PATRICIA, DO

## 2015-08-11 NOTE — Evaluation (Signed)
Physical Therapy Evaluation Patient Details Name: [name] [name] MRN: [id number] DOB: 02/19/1925 Today's Date: 08/11/2015   History of Present Illness  presented to ER with large-volume melena, dizziness and light-headedness; admitted with upper GIB, symptomatic anemia (admitting HgB 5.9 improved to 8.3 after transfusion).  Clinical Impression  Upon evaluation, patient alert and oriented; follows all commands and demonstrates good insight/safety awareness.  Bilat UE/LE strength and ROM grossly symmetrical and WFL; mild R foot drop noted with mobility (wears AFO at baseline).  Denies pain at this time.  Able to complete bed mobility indep; sit/stand, basic transfers and gait (200') with RW, cga.  Mild steppage gait to R LE, but no overt buckling or LOB.  Patient does prefer use of RW at this time; has access to one from daughter upon discharge.  Reports feeling generally fatigued with exertion, but no reports of dizziness or lightheadedness. Would benefit from skilled PT to address above deficits and promote optimal return to PLOF; Recommend transition to [place] upon discharge from acute hospitalization.     Follow Up Recommendations Home health PT    Equipment Recommendations  Rolling walker with 5" wheels (patient has access to walker from daughter)    Recommendations for Other Services       Precautions / Restrictions Precautions Precautions: Fall Restrictions Weight Bearing Restrictions: No      Mobility  Bed Mobility Overal bed mobility: Independent                Transfers Overall transfer level: Needs assistance Equipment used: Rolling walker (2 wheeled) Transfers: Sit to/from Stand Sit to Stand: Min guard         General transfer comment: requires UE support to complete  Ambulation/Gait Ambulation/Gait assistance: Min guard Ambulation Distance (Feet): 200 Feet Assistive device: Rolling walker (2 wheeled)       General Gait Details: reciprocal stepping  pattern with mild steppage gait R LE (baseline wears AFO); slightly impulsive, but with good cadence/gait speed.  no buckling or LOB; no dizziness/lightheadedness  Stairs            Wheelchair Mobility    Modified Rankin (Stroke Patients Only)       Balance Overall balance assessment: Needs assistance Sitting-balance support: No upper extremity supported;Feet supported Sitting balance-Leahy Scale: Good     Standing balance support: Bilateral upper extremity supported Standing balance-Leahy Scale: Fair                               Pertinent Vitals/Pain Pain Assessment: No/denies pain    Home Living Family/patient expects to be discharged to:: Private residence [facility]) Living Arrangements: Alone   Type of Home: Independent living facility Home Access: Level entry     Home Layout: One level Home Equipment: Cane - single point (R AFO)      Prior Function Level of Independence: Independent with assistive device(s)         Comments: Mod indep with SPC for ADLs, household and community mobility; + driving; denies fall history.  Wears R AFO as needed.     Hand Dominance        Extremity/Trunk Assessment   Upper Extremity Assessment: Overall WFL for tasks assessed           Lower Extremity Assessment: Overall WFL for tasks assessed (grossly 4+/5 throughout bilat LEs)         Communication      Cognition Arousal/Alertness: Awake/alert Behavior  During Therapy: WFL for tasks assessed/performed Overall Cognitive Status: Within Functional Limits for tasks assessed                      General Comments      Exercises        Assessment/Plan    PT Assessment Patient needs continued PT services  PT Diagnosis Difficulty walking;Generalized weakness   PT Problem List Decreased strength;Decreased range of motion;Decreased activity tolerance;Decreased balance;Decreased mobility;Decreased knowledge of use  of DME;Decreased safety awareness;Decreased knowledge of precautions;Pain  PT Treatment Interventions DME instruction;Functional mobility training;Gait training;Therapeutic activities;Therapeutic exercise;Patient/family education   PT Goals (Current goals can be found in the Care Plan section) Acute Rehab PT Goals Patient Stated Goal: to return home, to get my strength back PT Goal Formulation: With patient Time For Goal Achievement: 08/25/15 Potential to Achieve Goals: Good    Frequency Min 2X/week   Barriers to discharge Decreased caregiver support      Co-evaluation               End of Session Equipment Utilized During Treatment: Gait belt Activity Tolerance: Patient tolerated treatment well Patient left: in chair;with call bell/phone within reach;with chair alarm set Nurse Communication: Mobility status         Time: [id number] PT Time Calculation (min) (ACUTE ONLY): 17 min   Charges:   PT Evaluation $PT Eval Low Complexity: 1 Procedure     PT G Codes:        Leon Goodnow H. Owens Shark, PT, DPT, NCS 08/11/15, 10:18 AM [phone]

## 2015-08-12 ENCOUNTER — Encounter
Admission: EM | Disposition: A | Discharge status: Home / Self Care | Payer: Self-pay | Source: Home / Self Care | Attending: Internal Medicine

## 2015-08-12 ENCOUNTER — Inpatient Hospital Stay: Payer: Medicare Other | Admitting: Anesthesiology

## 2015-08-12 ENCOUNTER — Encounter: Payer: Self-pay | Admitting: Anesthesiology

## 2015-08-12 HISTORY — PX: ESOPHAGOGASTRODUODENOSCOPY (EGD) WITH PROPOFOL: SHX5813

## 2015-08-12 LAB — TYPE AND SCREEN
ABO/RH(D): O NEG
Antibody Screen: NEGATIVE
UNIT DIVISION: 0
UNIT DIVISION: 0

## 2015-08-12 LAB — H PYLORI, IGM, IGG, IGA AB
H Pylori IgG: <0.9 U/mL (ref 0.0–0.8)
H. Pylogi, Iga Abs: <9 units (ref 0.0–8.9)
H. Pylogi, Igm Abs: <9 units (ref 0.0–8.9)

## 2015-08-12 LAB — CBC
HCT: 24.9 % — ABNORMAL LOW (ref 35.0–47.0)
HEMOGLOBIN: 8.3 g/dL — AB (ref 12.0–16.0)
MCH: 25.5 pg — AB (ref 26.0–34.0)
MCHC: 33.2 g/dL (ref 32.0–36.0)
MCV: 76.9 fL — AB (ref 80.0–100.0)
Platelets: 307 10*3/uL (ref 150–440)
RBC: 3.24 MIL/uL — AB (ref 3.80–5.20)
RDW: 19 % — ABNORMAL HIGH (ref 11.5–14.5)
WBC: 8.8 10*3/uL (ref 3.6–11.0)

## 2015-08-12 LAB — BASIC METABOLIC PANEL
Anion gap: 7 (ref 5–15)
BUN: 18 mg/dL (ref 6–20)
CHLORIDE: 103 mmol/L (ref 101–111)
CO2: 27 mmol/L (ref 22–32)
Calcium: 8.5 mg/dL — ABNORMAL LOW (ref 8.9–10.3)
Creatinine, Ser: 0.69 mg/dL (ref 0.44–1.00)
GFR calc Af Amer: >60 mL/min (ref >60)
GFR calc non Af Amer: >60 mL/min (ref >60)
GLUCOSE: 92 mg/dL (ref 65–99)
POTASSIUM: 3.3 mmol/L — AB (ref 3.5–5.1)
Sodium: 137 mmol/L (ref 135–145)

## 2015-08-12 LAB — MAGNESIUM: Magnesium: 2 mg/dL (ref 1.7–2.4)

## 2015-08-12 SURGERY — ESOPHAGOGASTRODUODENOSCOPY (EGD) WITH PROPOFOL
Anesthesia: General

## 2015-08-12 MED ORDER — ACETAMINOPHEN 325 MG PO TABS
650.0000 mg | ORAL_TABLET | Freq: Once | ORAL | Status: AC
  Administered 2015-08-12: 650 mg via ORAL
  Filled 2015-08-12: qty 2

## 2015-08-12 MED ORDER — DOCUSATE SODIUM 100 MG PO CAPS
100.0000 mg | ORAL_CAPSULE | Freq: Two times a day (BID) | ORAL | Status: DC
  Administered 2015-08-12 – 2015-08-13 (×2): 100 mg via ORAL
  Filled 2015-08-12 (×3): qty 1

## 2015-08-12 MED ORDER — PROPOFOL 500 MG/50ML IV EMUL
INTRAVENOUS | Status: DC | PRN
  Administered 2015-08-12: 100 ug/kg/min via INTRAVENOUS

## 2015-08-12 MED ORDER — SODIUM CHLORIDE 0.9 % IV SOLN
500.0000 mg | Freq: Once | INTRAVENOUS | Status: AC
  Administered 2015-08-12: 15:00:00 500 mg via INTRAVENOUS
  Filled 2015-08-12: qty 25

## 2015-08-12 MED ORDER — MIDAZOLAM HCL 2 MG/2ML IJ SOLN
INTRAMUSCULAR | Status: DC | PRN
  Administered 2015-08-12: 1 mg via INTRAVENOUS

## 2015-08-12 MED ORDER — POTASSIUM CHLORIDE CRYS ER 20 MEQ PO TBCR
40.0000 meq | EXTENDED_RELEASE_TABLET | Freq: Once | ORAL | Status: AC
  Administered 2015-08-12: 40 meq via ORAL
  Filled 2015-08-12: qty 2

## 2015-08-12 MED ORDER — CETYLPYRIDINIUM CHLORIDE 0.05 % MT LIQD
7.0000 mL | Freq: Two times a day (BID) | OROMUCOSAL | Status: DC
  Administered 2015-08-12 (×2): 7 mL via OROMUCOSAL

## 2015-08-12 MED ORDER — PHENYLEPHRINE HCL 10 MG/ML IJ SOLN
INTRAMUSCULAR | Status: DC | PRN
  Administered 2015-08-12 (×2): 100 ug via INTRAVENOUS

## 2015-08-12 MED ORDER — DIPHENHYDRAMINE HCL 25 MG PO CAPS
25.0000 mg | ORAL_CAPSULE | Freq: Once | ORAL | Status: AC
  Administered 2015-08-12: 25 mg via ORAL
  Filled 2015-08-12: qty 1

## 2015-08-12 MED ORDER — CHLORHEXIDINE GLUCONATE 0.12 % MT SOLN
15.0000 mL | Freq: Two times a day (BID) | OROMUCOSAL | Status: DC
  Administered 2015-08-12 (×2): 15 mL via OROMUCOSAL
  Filled 2015-08-12: qty 15

## 2015-08-12 MED ORDER — SUCRALFATE 1 G PO TABS
1.0000 g | ORAL_TABLET | Freq: Four times a day (QID) | ORAL | Status: DC
  Administered 2015-08-12 – 2015-08-13 (×4): 1 g via ORAL
  Filled 2015-08-12 (×4): qty 1

## 2015-08-12 MED ORDER — PROPOFOL 10 MG/ML IV BOLUS
INTRAVENOUS | Status: DC | PRN
  Administered 2015-08-12: 50 mg via INTRAVENOUS

## 2015-08-12 MED ORDER — SODIUM CHLORIDE 0.9 % IV SOLN
INTRAVENOUS | Status: DC
  Administered 2015-08-12: 13:00:00 via INTRAVENOUS

## 2015-08-12 MED ORDER — PANTOPRAZOLE SODIUM 40 MG PO TBEC
40.0000 mg | DELAYED_RELEASE_TABLET | Freq: Two times a day (BID) | ORAL | Status: DC
  Administered 2015-08-12 – 2015-08-13 (×3): 40 mg via ORAL
  Filled 2015-08-12 (×3): qty 1

## 2015-08-12 MED ORDER — LIDOCAINE HCL (CARDIAC) 20 MG/ML IV SOLN
INTRAVENOUS | Status: DC | PRN
  Administered 2015-08-12: 60 mg via INTRAVENOUS

## 2015-08-12 NOTE — Progress Notes (Signed)
[facility]                                                                                                                                                                                            Patient Demographics   [name] [name], is a 82 y.o. female, DOB - August 06, 1925, [id number]  Admit date - 08/10/2015   Admitting Physician Lance Coon, MD  Outpatient Primary MD for the patient is Barber, [place], DO   LOS - 1  Subjective:Patient awaiting EGD states that abdominal discomforts improved No further bleeding     Review of Systems:   CONSTITUTIONAL: No documented fever. No fatigue, weakness. No weight gain, no weight loss.  EYES: No blurry or double vision.  ENT: No tinnitus. No postnasal drip. No redness of the oropharynx.  RESPIRATORY: No cough, no wheeze, no hemoptysis. No dyspnea.  CARDIOVASCULAR: No chest pain. No orthopnea. No palpitations. No syncope.  GASTROINTESTINAL: No nausea, no vomiting or diarrhea. No abdominal pain. Resolved melena no hematochezia.  GENITOURINARY: No dysuria or hematuria.  ENDOCRINE: No polyuria or nocturia. No heat or cold intolerance.  HEMATOLOGY: No anemia. No bruising. No bleeding.  INTEGUMENTARY: No rashes. No lesions.  MUSCULOSKELETAL: No arthritis. No swelling. No gout.  NEUROLOGIC: No numbness, tingling, or ataxia. No seizure-type activity.  PSYCHIATRIC: No anxiety. No insomnia. No ADD.    Vitals:   Vitals:   08/11/15 2019 08/11/15 2021 08/11/15 2245 08/12/15 0408  BP: (!) 182/65 (!) 180/71 (!) 150/55 (!) 159/60  Pulse: 80 75 73 74  Resp: 20   17  Temp: 97.9 F (36.6 C)   97.6 F (36.4 C)  TempSrc: Oral   Oral  SpO2: 100% 99%  99%  Weight:      Height:        Wt Readings from Last 3 Encounters:  08/11/15 85.8 kg (189 lb 3.2 oz)     Intake/Output Summary (Last 24 hours) at 08/12/15 1217 Last data filed at 08/11/15 1900  Gross per 24 hour  Intake           1321.25 ml  Output                0 ml  Net          1321.25 ml    Physical Exam:   GENERAL: Pleasant-appearing in no apparent distress.  HEAD, EYES, EARS, NOSE AND THROAT: Atraumatic, normocephalic. Extraocular muscles are intact. Pupils equal and reactive to light. Sclerae anicteric. No conjunctival injection. No oro-pharyngeal erythema.  NECK: Supple. There is no jugular venous distention. No bruits, no lymphadenopathy,  no thyromegaly.  HEART: Regular rate and rhythm,. No murmurs, no rubs, no clicks.  LUNGS: Clear to auscultation bilaterally. No rales or rhonchi. No wheezes.  ABDOMEN: Soft, mild distention, nondistended. Has good bowel sounds. No hepatosplenomegaly appreciated.  EXTREMITIES: No evidence of any cyanosis, clubbing, or peripheral edema.  +2 pedal and radial pulses bilaterally.  NEUROLOGIC: The patient is alert, awake, and oriented x3 with no focal motor or sensory deficits appreciated bilaterally.  SKIN: Moist and warm with no rashes appreciated.  Psych: Not anxious, depressed LN: No inguinal LN enlargement    Antibiotics   Anti-infectives    None      Medications   Scheduled Meds: . antiseptic oral rinse  7 mL Mouth Rinse q12n4p  . chlorhexidine  15 mL Mouth Rinse BID  . docusate sodium  100 mg Oral BID  . gabapentin  300 mg Oral BID  . iron sucrose  500 mg Intravenous Once  . pantoprazole  40 mg Oral BID  . PARoxetine  40 mg Oral BH-q7a  . simethicone  80 mg Oral QID  . sodium chloride flush  3 mL Intravenous Q12H  . timolol  1 drop Both Eyes Daily   Continuous Infusions:   PRN Meds:.[DISCONTINUED] acetaminophen **OR** acetaminophen, acetaminophen, albuterol, fluticasone, ondansetron **OR** ondansetron (ZOFRAN) IV, traMADol   Data Review:   Micro Results No results found for this or any previous visit (from the past 240 hour(s)).  Radiology Reports Dg Abd 2 Views  Result Date: 08/11/2015 CLINICAL DATA:  Melena EXAM: ABDOMEN - 2 VIEW COMPARISON:   None. FINDINGS: Scattered large and small bowel gas noted. Degenerative changes of the lumbar spine are seen with a scoliosis concave to the right. Right hip replacement is noted. Degenerative changes of the left hip joint is seen. No free air is noted. IMPRESSION: No acute abnormality noted. Electronically Signed   By: Inez Catalina M.D.   On: 08/11/2015 11:17     CBC  Recent Labs Lab 08/10/15 2122 08/11/15 0709 08/11/15 1137 08/11/15 2035 08/12/15 0438  WBC 9.8 10.0  --   --  8.8  HGB 5.9* 8.3* 8.0* 8.6* 8.3*  HCT 18.7* 24.2*  --   --  24.9*  PLT 329 282  --   --  307  MCV 75.2* 76.0*  --   --  76.9*  MCH 23.9* 26.1  --   --  25.5*  MCHC 31.8* 34.3  --   --  33.2  RDW 20.7* 19.4*  --   --  19.0*  LYMPHSABS 1.5  --   --   --   --   MONOABS 1.3*  --   --   --   --   EOSABS 0.2  --   --   --   --   BASOSABS 0.3*  --   --   --   --     Chemistries   Recent Labs Lab 08/10/15 2122 08/11/15 0709 08/12/15 0438  NA 136 137 137  K 3.4* 3.3* 3.3*  CL 100* 103 103  CO2 26 26 27   GLUCOSE 115* 95 92  BUN 33* 32* 18  CREATININE 0.99 0.72 0.69  CALCIUM 9.0 8.3* 8.5*  AST 27  --   --   ALT 17  --   --   ALKPHOS 37*  --   --   BILITOT 0.3  --   --    ------------------------------------------------------------------------------------------------------------------ estimated creatinine clearance is 56.8 mL/min (by C-G formula based on SCr  of 0.8 mg/dL). ------------------------------------------------------------------------------------------------------------------ No results for input(s): HGBA1C in the last 72 hours. ------------------------------------------------------------------------------------------------------------------ No results for input(s): CHOL, HDL, LDLCALC, TRIG, CHOLHDL, LDLDIRECT in the last 72 hours. ------------------------------------------------------------------------------------------------------------------ No results for input(s): TSH, T4TOTAL, T3FREE,  THYROIDAB in the last 72 hours.  Invalid input(s): FREET3 ------------------------------------------------------------------------------------------------------------------  Recent Labs  08/11/15 2035  FERRITIN 13  TIBC 484*  IRON 17*    Coagulation profile  Recent Labs Lab 08/10/15 2122  INR 1.00    No results for input(s): DDIMER in the last 72 hours.  Cardiac Enzymes No results for input(s): CKMB, TROPONINI, MYOGLOBIN in the last 168 hours.  Invalid input(s): CK ------------------------------------------------------------------------------------------------------------------ Invalid input(s): [place]   Patient is a 82 year old with history of GI bleed in the past presents with upper GI bleed  1. upper  GI bleed -  Plan for EGD later today Hemoglobin stable Change Protonix to by mouth twice a day Carafate on discharge per GI recommendation  2. Abdominal distention Likely due to gas now resolved  3. Hypokalemia I continue replacement   4. Acute blood loss anemia status post transfusion Has iron deficiency Will order 1 dose of iron infusion  5. Essential   HTN (hypertension) - currently normotensive, continue to monitor  6.   COPD (chronic obstructive pulmonary disease) ([place]) - continue home inhalers no evidence of exasperation  7.  HLD (hyperlipidemia) -  add on any therapy continue to monitor  8.  GERD (gastroesophageal reflux disease) - PPI as above      Code Status Orders        Start     Ordered   08/11/15 0142  Full code  Continuous     08/11/15 0141    Code Status History    Date Active Date Inactive Code Status Order ID Comments User Context   This patient has a current code status but no historical code status.    Advance Directive Documentation   Flowsheet Row Most Recent Value  Type of Advance Directive  Healthcare Power of Attorney, Living will  Pre-existing out of facility DNR order (yellow form or pink  MOST form)  No data  "MOST" Form in Place?  No data           ConsultsGastroenterology   DVT Prophylaxis  SCDs  Lab Results  Component Value Date   PLT 307 08/12/2015     Time Spent in minutes 50min  Greater than 50% of time spent in care coordination and counseling patient regarding the condition and plan of care.   Dustin Flock M.D on 08/12/2015 at 12:17 PM  Between 7am to 6pm - Pager - [phone]  After 6pm go to [email or web address] - password [email or web address] [facility]   Office  [phone]

## 2015-08-12 NOTE — Care Management Important Message (Signed)
Important Message  Patient Details  Name: [name] [name] MRN: [id number] Date of Birth: 21-Dec-1925   Medicare Important Message Given:  Yes    Shelbie Ammons, RN 08/12/2015, 7:37 AM

## 2015-08-12 NOTE — Anesthesia Preprocedure Evaluation (Signed)
Anesthesia Evaluation  Patient identified by MRN, date of birth, ID band Patient awake    Reviewed: Allergy & Precautions, H&P , NPO status , Patient's Chart, lab work & pertinent test results, reviewed documented beta blocker date and time   History of Anesthesia Complications (+) PROLONGED EMERGENCE and history of anesthetic complications  Airway Mallampati: III  TM Distance: >3 FB Neck ROM: full    Dental no notable dental hx. (+) Caps, Poor Dentition   Pulmonary shortness of breath, neg sleep apnea, COPD,  COPD inhaler, neg recent URI, former smoker,    Pulmonary exam normal breath sounds clear to auscultation       Cardiovascular Exercise Tolerance: Good hypertension, On Medications (-) angina(-) CAD, (-) Past MI, (-) Cardiac Stents and (-) CABG Normal cardiovascular exam(-) dysrhythmias (-) Valvular Problems/Murmurs Rhythm:regular Rate:Normal     Neuro/Psych negative neurological ROS  negative psych ROS   GI/Hepatic Neg liver ROS, GERD  ,  Endo/Other  negative endocrine ROS  Renal/GU negative Renal ROS  negative genitourinary   Musculoskeletal   Abdominal   Peds  Hematology  (+) anemia ,   Anesthesia Other Findings Past Medical History: No date: COPD (chronic obstructive pulmonary disease) (* No date: Diverticulitis No date: GERD (gastroesophageal reflux disease) No date: Glaucoma No date: HLD (hyperlipidemia) No date: Hypertension No date: Rectal bleeding   Reproductive/Obstetrics negative OB ROS                             Anesthesia Physical Anesthesia Plan  ASA: III  Anesthesia Plan: General   Post-op Pain Management:    Induction:   Airway Management Planned:   Additional Equipment:   Intra-op Plan:   Post-operative Plan:   Informed Consent: I have reviewed the patients History and Physical, chart, labs and discussed the procedure including the risks,  benefits and alternatives for the proposed anesthesia with the patient or authorized representative who has indicated his/her understanding and acceptance.   Dental Advisory Given  Plan Discussed with: Anesthesiologist, CRNA and Surgeon  Anesthesia Plan Comments:         Anesthesia Quick Evaluation

## 2015-08-12 NOTE — Consult Note (Signed)
Please see full EGD report.  Findings consistant with erosive gastritis.  Recommend continuing ppi bid, change to rabeprazole for outpatient, continue carafate as outpatient qid.   Patient will need GI o/p fu in about 10-14 days.  Recheck hgb in am. Clears today, advance to full liquid tomorrow.

## 2015-08-12 NOTE — Op Note (Signed)
[facility] Patient Name: [name] [name] Procedure Date: 08/12/2015 1:03 PM MRN: [id number] Account #: [id number] Date of Birth: 07/09/1925 Admit Type: Inpatient Age: 82 Room: [facility] ENDO ROOM 1 Gender: Female Note Status: Finalized Procedure:            Upper GI endoscopy Indications:          Acute post hemorrhagic anemia, Iron deficiency anemia                        secondary to chronic blood loss, Iron deficiency anemia Providers:            Lollie Sails, MD Referring MD:         Ardyth Man. Bobette Mo (Referring MD) Medicines:            Monitored Anesthesia Care Complications:        No immediate complications. Procedure:            Pre-Anesthesia Assessment:                       - ASA Grade Assessment: III - A patient with severe                        systemic disease.                       After obtaining informed consent, the endoscope was                        passed under direct vision. Throughout the procedure,                        the patient's blood pressure, pulse, and oxygen                        saturations were monitored continuously. The Endoscope                        was introduced through the mouth, and advanced to the                        third part of duodenum. The upper GI endoscopy was                        accomplished without difficulty. The patient tolerated                        the procedure well. Findings:      The Z-line was variable.      The middle third of the esophagus and lower third of the esophagus were       moderately tortuous.      A few dispersed, 1 to 2 mm non-bleeding erosions were found in the       gastric body and in the gastric antrum. There were several small areas       with heme material present, however, when rinsed, there was no evidence       of a bleeding lesion.      The cardia and gastric fundus were normal on retroflexion.      The examined duodenum was normal.      -no  evidence of active bleeding  throughout. Impression:           - Z-line variable.                       - Tortuous esophagus.                       - Non-bleeding erosive gastropathy.                       - Normal examined duodenum.                       - No specimens collected. Recommendation:       - Return patient to hospital ward for ongoing care.                       - Use Aciphex (rabeprazole) 20 mg PO BID daily.                       - Use sucralfate tablets 1 gram PO QID.                       - Return to GI clinic in 3-4 weeks. Procedure Code(s):    --- Professional ---                       [phone], Esophagogastroduodenoscopy, flexible, transoral;                        diagnostic, including collection of specimen(s) by                        brushing or washing, when performed (separate procedure) Diagnosis Code(s):    --- Professional ---                       K22.8, Other specified diseases of esophagus                       Q39.9, Congenital malformation of esophagus, unspecified                       K31.89, Other diseases of stomach and duodenum                       D62, Acute posthemorrhagic anemia                       D50.0, Iron deficiency anemia secondary to blood loss                        (chronic)                       D50.9, Iron deficiency anemia, unspecified CPT copyright 2016 [employer]. All rights reserved. The codes documented in this report are preliminary and upon coder review may  be revised to meet current compliance requirements. Lollie Sails, MD 08/12/2015 1:26:33 PM This report has been signed electronically. Number of Addenda: 0 Note Initiated On: 08/12/2015 1:03 PM      [facility]

## 2015-08-12 NOTE — Plan of Care (Signed)
Problem: Bowel/Gastric: Goal: Will show no signs and symptoms of gastrointestinal bleeding Outcome: Progressing No stool nor signs of bleeding during the shift.   Problem: Physical Regulation: Goal: Complications related to the disease process, condition or treatment will be avoided or minimized Outcome: Progressing Pt had EGD today. Iron transfusion given. Pt tolerates clear liquid diet. Denies pain/n/v.

## 2015-08-12 NOTE — Consult Note (Signed)
Subjective: Patient seen for melena, symptomatic anemia. Patient has done well overnight. She's had no further bowel movements or  emesis. There is no nausea or abdominal pain.  Objective: Vital signs in last 24 hours: Temp:  [97.6 F (36.4 C)-97.9 F (36.6 C)] 97.6 F (36.4 C) (08/03 0408) Pulse Rate:  [73-80] 74 (08/03 0408) Resp:  [17-20] 17 (08/03 0408) BP: (150-182)/(55-72) 159/60 (08/03 0408) SpO2:  [98 %-100 %] 99 % (08/03 0408) Blood pressure (!) 159/60, pulse 74, temperature 97.6 F (36.4 C), temperature source Oral, resp. rate 17, height 5\' 9"  (1.753 m), weight 85.8 kg (189 lb 3.2 oz), SpO2 99 %.   Intake/Output from previous day: 08/02 0701 - 08/03 0700 In: 1767.5 [P.O.:960; I.V.:807.5] Out: -   Intake/Output this shift: No intake/output data recorded.   General appearance:  82 year old female no distress Resp:  Bilaterally clear to auscultation Cardio:  Regular rate and rhythm without rub or gallop GI:  Soft nontender nondistended bowel sounds positive normoactive. Extremities:  No clubbing cyanosis or edema   Lab Results: Results for orders placed or performed during the hospital encounter of 08/10/15 (from the past 24 hour(s))  Hemoglobin     Status: Abnormal   Collection Time: 08/11/15  8:35 PM  Result Value Ref Range   Hemoglobin 8.6 (L) 12.0 - 16.0 g/dL  Ferritin     Status: None   Collection Time: 08/11/15  8:35 PM  Result Value Ref Range   Ferritin 13 11 - 307 ng/mL  Iron and TIBC     Status: Abnormal   Collection Time: 08/11/15  8:35 PM  Result Value Ref Range   Iron 17 (L) 28 - 170 ug/dL   TIBC 484 (H) 250 - 450 ug/dL   Saturation Ratios 4 (L) 10.4 - 31.8 %   UIBC 467 ug/dL  CBC     Status: Abnormal   Collection Time: 08/12/15  4:38 AM  Result Value Ref Range   WBC 8.8 3.6 - 11.0 K/uL   RBC 3.24 (L) 3.80 - 5.20 MIL/uL   Hemoglobin 8.3 (L) 12.0 - 16.0 g/dL   HCT 24.9 (L) 35.0 - 47.0 %   MCV 76.9 (L) 80.0 - 100.0 fL   MCH 25.5 (L) 26.0 -  34.0 pg   MCHC 33.2 32.0 - 36.0 g/dL   RDW 19.0 (H) 11.5 - 14.5 %   Platelets 307 150 - 440 K/uL  Basic metabolic panel     Status: Abnormal   Collection Time: 08/12/15  4:38 AM  Result Value Ref Range   Sodium 137 135 - 145 mmol/L   Potassium 3.3 (L) 3.5 - 5.1 mmol/L   Chloride 103 101 - 111 mmol/L   CO2 27 22 - 32 mmol/L   Glucose, Bld 92 65 - 99 mg/dL   BUN 18 6 - 20 mg/dL   Creatinine, Ser 0.69 0.44 - 1.00 mg/dL   Calcium 8.5 (L) 8.9 - 10.3 mg/dL   GFR calc non Af Amer >60 >60 mL/min   GFR calc Af Amer >60 >60 mL/min   Anion gap 7 5 - 15      Recent Labs  08/10/15 2122 08/11/15 0709 08/11/15 1137 08/11/15 2035 08/12/15 0438  WBC 9.8 10.0  --   --  8.8  HGB 5.9* 8.3* 8.0* 8.6* 8.3*  HCT 18.7* 24.2*  --   --  24.9*  PLT 329 282  --   --  307   BMET  Recent Labs  08/10/15 2122 08/11/15 [id number]  08/12/15 0438  NA 136 137 137  K 3.4* 3.3* 3.3*  CL 100* 103 103  CO2 26 26 27   GLUCOSE 115* 95 92  BUN 33* 32* 18  CREATININE 0.99 0.72 0.69  CALCIUM 9.0 8.3* 8.5*   LFT  Recent Labs  08/10/15 2122  PROT 6.5  ALBUMIN 3.8  AST 27  ALT 17  ALKPHOS 37*  BILITOT 0.3   PT/INR  Recent Labs  08/10/15 2122  LABPROT 13.2  INR 1.00   Hepatitis Panel No results for input(s): HEPBSAG, HCVAB, HEPAIGM, HEPBIGM in the last 72 hours. C-Diff No results for input(s): CDIFFTOX in the last 72 hours. No results for input(s): CDIFFPCR in the last 72 hours.   Studies/Results: Dg Abd 2 Views  Result Date: 08/11/2015 CLINICAL DATA:  Melena EXAM: ABDOMEN - 2 VIEW COMPARISON:  None. FINDINGS: Scattered large and small bowel gas noted. Degenerative changes of the lumbar spine are seen with a scoliosis concave to the right. Right hip replacement is noted. Degenerative changes of the left hip joint is seen. No free air is noted. IMPRESSION: No acute abnormality noted. Electronically Signed   By: Inez Catalina M.D.   On: 08/11/2015 11:17    Scheduled Inpatient Medications:   .  [facility] Hold] antiseptic oral rinse  7 mL Mouth Rinse q12n4p  . [MAR Hold] chlorhexidine  15 mL Mouth Rinse BID  . [MAR Hold] docusate sodium  100 mg Oral BID  . [MAR Hold] gabapentin  300 mg Oral BID  . [MAR Hold] iron sucrose  500 mg Intravenous Once  . [MAR Hold] pantoprazole  40 mg Oral BID  . [MAR Hold] PARoxetine  40 mg Oral BH-q7a  . [MAR Hold] simethicone  80 mg Oral QID  . [MAR Hold] sodium chloride flush  3 mL Intravenous Q12H  . [MAR Hold] timolol  1 drop Both Eyes Daily    Continuous Inpatient Infusions:     PRN Inpatient Medications:  [DISCONTINUED] acetaminophen **OR** [MAR Hold] acetaminophen, [MAR Hold] acetaminophen, [MAR Hold] albuterol, [MAR Hold] fluticasone, [MAR Hold] ondansetron **OR** [MAR Hold] ondansetron (ZOFRAN) IV, [MAR Hold] traMADol  Miscellaneous:   Assessment:  1. Acute on chronic GI blood loss with iron deficiency anemia. Recent melena.  Plan:  1. We'll proceed with EGD today. I have discussed the risks benefits and complications of procedures to include not limited to bleeding, infection, perforation and the risk of sedation and the patient wishes to proceed. Further recommendations to follow continue current medications.  Lollie Sails MD 08/12/2015, 12:51 PM

## 2015-08-12 NOTE — Transfer of Care (Signed)
Immediate Anesthesia Transfer of Care Note  Patient: [name] [name]  Procedure(s) Performed: Procedure(s): ESOPHAGOGASTRODUODENOSCOPY (EGD) WITH PROPOFOL (N/A)  Patient Location: PACU  Anesthesia Type:General  Level of Consciousness: awake, alert , oriented and patient cooperative  Airway & Oxygen Therapy: Patient Spontanous Breathing and Patient connected to nasal cannula oxygen  Post-op Assessment: Report given to RN, Post -op Vital signs reviewed and stable and Patient moving all extremities X 4  Post vital signs: Reviewed and stable  Last Vitals:  Vitals:   08/12/15 1255 08/12/15 1326  BP:  (!) (P) 108/48  Pulse: 70   Resp: 20 (P) 16  Temp:  (P) 36.6 C    Last Pain:  Vitals:   08/12/15 1326  TempSrc: (P) Tympanic  PainSc:       Patients Stated Pain Goal: 6 ([id number])  Complications: No apparent anesthesia complications

## 2015-08-13 ENCOUNTER — Encounter: Payer: Self-pay | Admitting: Gastroenterology

## 2015-08-13 LAB — CBC
HCT: 27.8 % — ABNORMAL LOW (ref 35.0–47.0)
Hemoglobin: 9.1 g/dL — ABNORMAL LOW (ref 12.0–16.0)
MCH: 25.6 pg — ABNORMAL LOW (ref 26.0–34.0)
MCHC: 32.7 g/dL (ref 32.0–36.0)
MCV: 78.3 fL — AB (ref 80.0–100.0)
PLATELETS: 353 10*3/uL (ref 150–440)
RBC: 3.56 MIL/uL — AB (ref 3.80–5.20)
RDW: 20 % — AB (ref 11.5–14.5)
WBC: 10.1 10*3/uL (ref 3.6–11.0)

## 2015-08-13 MED ORDER — SUCRALFATE 1 G PO TABS: 1.0000 g | ORAL_TABLET | Freq: Three times a day (TID) | ORAL | 1 refills | Status: DC

## 2015-08-13 NOTE — Anesthesia Postprocedure Evaluation (Signed)
Anesthesia Post Note  Patient: [name] [name]  Procedure(s) Performed: Procedure(s) (LRB): ESOPHAGOGASTRODUODENOSCOPY (EGD) WITH PROPOFOL (N/A)  Patient location during evaluation: Endoscopy Anesthesia Type: General Level of consciousness: awake and alert Pain management: pain level controlled Vital Signs Assessment: post-procedure vital signs reviewed and stable Respiratory status: spontaneous breathing, nonlabored ventilation, respiratory function stable and patient connected to nasal cannula oxygen Cardiovascular status: blood pressure returned to baseline and stable Postop Assessment: no signs of nausea or vomiting Anesthetic complications: no    Last Vitals:  Vitals:   08/12/15 2119 08/13/15 0527  BP: (!) 159/63 (!) 158/60  Pulse: 70 78  Resp:    Temp: 37.1 C 36.3 C    Last Pain:  Vitals:   08/13/15 0800  TempSrc:   PainSc: 0-No pain                 Martha Clan

## 2015-08-13 NOTE — Care Management (Signed)
Discharge to home today per Dr. Posey Pronto. Physical therapy evaluation completed. Recommends home with home health and physical therapy. Declines home health at this time. Son-in-law will transport, Shelbie Ammons RN MSN CCM Care Management [phone]

## 2015-08-13 NOTE — Discharge Summary (Signed)
[name] [name], 82 y.o., DOB 1925-04-19, MRN [id number]. Admission date: 08/10/2015 Discharge Date 08/13/2015 Primary MD Gearldine Shown, DO Admitting Physician Lance Coon, MD  Admission Diagnosis  Acute upper GI bleed [K92.2] Symptomatic anemia [D64.9]  Discharge Diagnosis   Principal Problem:   Upper GI bleed   Symptomatic iron deficiency anemia   HTN (hypertension)   HLD (hyperlipidemia)   GERD (gastroesophageal reflux disease)   COPD (chronic obstructive pulmonary disease) ([place])   History of diverticulitis  Glaucoma  Hyperlipidemia         Hospital Course [name] [name]  is a 82 y.o. female who presents with Several episodes of large volume melena. Patient is a known history of gastric ulcer with GI bleed in the past melena. Her hemoglobin was very low on admission. She was admitted and started on transfusion and a Protonix drip. After receiving her transfusion her hemoglobin has remained stable. She was seen by GI and had a endoscopy done which showed erosive gastropathy. GI recommended her to be started on Carafate and continue her PPIs. Patient's hemoglobin is remaining stable. Her iron level was very low as well therefore she was transfused iron. She has not had any further episode of bleeding she is feeling better and is stable for discharge home.           Consults  GI  Significant Tests:  See full reports for all details    Dg Abd 2 Views  Result Date: 08/11/2015 CLINICAL DATA:  Melena EXAM: ABDOMEN - 2 VIEW COMPARISON:  None. FINDINGS: Scattered large and small bowel gas noted. Degenerative changes of the lumbar spine are seen with a scoliosis concave to the right. Right hip replacement is noted. Degenerative changes of the left hip joint is seen. No free air is noted. IMPRESSION: No acute abnormality noted. Electronically Signed   By: Inez Catalina M.D.   On: 08/11/2015 11:17       Today   Subjective:   [name] [name]  feels better denies any  further bleeding  Objective:   Blood pressure (!) 158/60, pulse 78, temperature 97.4 F (36.3 C), temperature source Oral, resp. rate 18, height 5\' 9"  (1.753 m), weight 85.8 kg (189 lb 3.2 oz), SpO2 94 %.  .  Intake/Output Summary (Last 24 hours) at 08/13/15 1312 Last data filed at 08/13/15 1136  Gross per 24 hour  Intake             1360 ml  Output             2950 ml  Net            -1590 ml    Exam VITAL SIGNS: Blood pressure (!) 158/60, pulse 78, temperature 97.4 F (36.3 C), temperature source Oral, resp. rate 18, height 5\' 9"  (1.753 m), weight 85.8 kg (189 lb 3.2 oz), SpO2 94 %.  GENERAL:  82 y.o.-year-old patient lying in the bed with no acute distress.  EYES: Pupils equal, round, reactive to light and accommodation. No scleral icterus. Extraocular muscles intact.  HEENT: Head atraumatic, normocephalic. Oropharynx and nasopharynx clear.  NECK:  Supple, no jugular venous distention. No thyroid enlargement, no tenderness.  LUNGS: Normal breath sounds bilaterally, no wheezing, rales,rhonchi or crepitation. No use of accessory muscles of respiration.  CARDIOVASCULAR: S1, S2 normal. No murmurs, rubs, or gallops.  ABDOMEN: Soft, nontender, nondistended. Bowel sounds present. No organomegaly or mass.  EXTREMITIES: No pedal edema, cyanosis, or clubbing.  NEUROLOGIC: Cranial nerves II through XII are intact. Muscle  strength 5/5 in all extremities. Sensation intact. Gait not checked.  PSYCHIATRIC: The patient is alert and oriented x 3.  SKIN: No obvious rash, lesion, or ulcer.   Data Review     CBC w Diff: Lab Results  Component Value Date   WBC 10.1 08/13/2015   HGB 9.1 (L) 08/13/2015   HCT 27.8 (L) 08/13/2015   PLT 353 08/13/2015   LYMPHOPCT 15 08/10/2015   MONOPCT 14 08/10/2015   EOSPCT 3 08/10/2015   BASOPCT 3 08/10/2015   CMP: Lab Results  Component Value Date   NA 137 08/12/2015   K 3.3 (L) 08/12/2015   CL 103 08/12/2015   CO2 27 08/12/2015   BUN 18 08/12/2015    CREATININE 0.69 08/12/2015   PROT 6.5 08/10/2015   ALBUMIN 3.8 08/10/2015   BILITOT 0.3 08/10/2015   ALKPHOS 37 (L) 08/10/2015   AST 27 08/10/2015   ALT 17 08/10/2015  .  Micro Results No results found for this or any previous visit (from the past 240 hour(s)).      Code Status Orders        Start     Ordered   08/11/15 0142  Full code  Continuous     08/11/15 0141    Code Status History    Date Active Date Inactive Code Status Order ID Comments User Context   This patient has a current code status but no historical code status.    Advance Directive Documentation   Flowsheet Row Most Recent Value  Type of Advance Directive  Healthcare Power of Attorney, Living will  Pre-existing out of facility DNR order (yellow form or pink MOST form)  No data  "MOST" Form in Place?  No data          Follow-up Information    SANTAYANA, GLORIA PATRICIA, DO. Go on 08/20/2015.   Specialty:  Family Medicine Why:  @10 :20 AM Contact information: [place] [address] [id number]        Lollie Sails, MD. Go on 09/07/2015.   Specialty:  Gastroenterology Why:  @3 :30 PM Contact information: [place] [facility] [place] [address] [phone]           Discharge Medications     Medication List    TAKE these medications   acetaminophen 500 MG tablet Commonly known as:  TYLENOL Take 500-1,000 mg by mouth every 6 (six) hours as needed for mild pain.   TYLENOL 500 MG tablet Generic drug:  acetaminophen Take 500 mg by mouth every 6 (six) hours as needed.   albuterol 108 (90 Base) MCG/ACT inhaler Commonly known as:  PROVENTIL HFA;VENTOLIN HFA Inhale 2 puffs into the lungs every 4 (four) hours as needed for wheezing or shortness of breath.   chlorthalidone 25 MG tablet Commonly known as:  HYGROTON Take 12.5 mg by mouth daily.   cholecalciferol 1000 units tablet Commonly known as:  VITAMIN D Take 1,000 Units by  mouth 2 (two) times daily.   cycloSPORINE 0.05 % ophthalmic emulsion Commonly known as:  RESTASIS Place 1 drop into both eyes 2 (two) times daily.   fluticasone 50 MCG/ACT nasal spray Commonly known as:  FLONASE Place 2 sprays into both nostrils daily as needed for allergies or rhinitis.   gabapentin 300 MG capsule Commonly known as:  NEURONTIN Take 300 mg by mouth 2 (two) times daily.   Krill Oil 500 MG Caps Take 500 mg by mouth daily.   losartan 50  MG tablet Commonly known as:  COZAAR Take 50 mg by mouth daily.   mometasone 0.1 % lotion Commonly known as:  ELOCON Apply 1 application topically daily as needed (rash).   multivitamin with minerals Tabs tablet Take 1 tablet by mouth daily.   nystatin powder Generic drug:  nystatin Apply 1 g topically 2 (two) times daily as needed (rash).   OCUVITE PRESERVISION PO Take 1 tablet by mouth 2 (two) times daily.   omeprazole 40 MG capsule Commonly known as:  PRILOSEC Take 40 mg by mouth daily.   PARoxetine 40 MG tablet Commonly known as:  PAXIL Take 40 mg by mouth every morning.   potassium chloride 10 MEQ tablet Commonly known as:  K-DUR,KLOR-CON Take 10 mEq by mouth 2 (two) times daily.   sucralfate 1 g tablet Commonly known as:  CARAFATE Take 1 tablet (1 g total) by mouth 4 (four) times daily -  with meals and at bedtime.   SYSTANE 0.4-0.3 % Soln Generic drug:  Polyethyl Glycol-Propyl Glycol Apply 1 drop to eye as needed.   timolol 0.5 % ophthalmic gel-forming Commonly known as:  TIMOPTIC-XR Place 1 drop into both eyes daily.   VALTREX 1000 MG tablet Generic drug:  valACYclovir Take 1,000 mg by mouth daily as needed (fever blister).          Total Time in preparing paper work, data evaluation and todays exam - 35 minutes  Dustin Flock M.D on 08/13/2015 at 1:12 PM  [facility]   Office  [phone]

## 2015-08-13 NOTE — Discharge Instructions (Signed)

## 2015-08-26 ENCOUNTER — Other Ambulatory Visit: Payer: Self-pay | Admitting: Gastroenterology

## 2015-08-26 DIAGNOSIS — D649 Anemia, unspecified: Secondary | ICD-10-CM

## 2015-08-30 ENCOUNTER — Ambulatory Visit
Admission: RE | Admit: 2015-08-30 | Discharge: 2015-08-30 | Disposition: A | Discharge status: Home / Self Care | Payer: Medicare Other | Source: Ambulatory Visit | Attending: Gastroenterology | Admitting: Gastroenterology

## 2015-08-30 DIAGNOSIS — D649 Anemia, unspecified: Secondary | ICD-10-CM

## 2015-08-30 DIAGNOSIS — K449 Diaphragmatic hernia without obstruction or gangrene: Secondary | ICD-10-CM | POA: Diagnosis not present

## 2015-08-30 DIAGNOSIS — K228 Other specified diseases of esophagus: Secondary | ICD-10-CM | POA: Insufficient documentation

## 2015-09-20 ENCOUNTER — Inpatient Hospital Stay: Payer: Medicare Other | Admitting: Oncology

## 2015-09-27 ENCOUNTER — Encounter: Payer: Self-pay | Admitting: Emergency Medicine

## 2015-09-27 ENCOUNTER — Emergency Department: Payer: Medicare Other

## 2015-09-27 ENCOUNTER — Emergency Department
Admission: EM | Admit: 2015-09-27 | Discharge: 2015-09-27 | Disposition: A | Discharge status: Home / Self Care | Payer: Medicare Other | Attending: Emergency Medicine | Admitting: Emergency Medicine

## 2015-09-27 DIAGNOSIS — W06XXXA Fall from bed, initial encounter: Secondary | ICD-10-CM | POA: Insufficient documentation

## 2015-09-27 DIAGNOSIS — Z79899 Other long term (current) drug therapy: Secondary | ICD-10-CM | POA: Diagnosis not present

## 2015-09-27 DIAGNOSIS — Y999 Unspecified external cause status: Secondary | ICD-10-CM | POA: Insufficient documentation

## 2015-09-27 DIAGNOSIS — I10 Essential (primary) hypertension: Secondary | ICD-10-CM | POA: Diagnosis not present

## 2015-09-27 DIAGNOSIS — S52502A Unspecified fracture of the lower end of left radius, initial encounter for closed fracture: Secondary | ICD-10-CM | POA: Diagnosis not present

## 2015-09-27 DIAGNOSIS — J449 Chronic obstructive pulmonary disease, unspecified: Secondary | ICD-10-CM | POA: Insufficient documentation

## 2015-09-27 DIAGNOSIS — Y9389 Activity, other specified: Secondary | ICD-10-CM | POA: Diagnosis not present

## 2015-09-27 DIAGNOSIS — Z791 Long term (current) use of non-steroidal anti-inflammatories (NSAID): Secondary | ICD-10-CM | POA: Diagnosis not present

## 2015-09-27 DIAGNOSIS — Y929 Unspecified place or not applicable: Secondary | ICD-10-CM | POA: Diagnosis not present

## 2015-09-27 DIAGNOSIS — S6992XA Unspecified injury of left wrist, hand and finger(s), initial encounter: Secondary | ICD-10-CM | POA: Diagnosis present

## 2015-09-27 DIAGNOSIS — Z87891 Personal history of nicotine dependence: Secondary | ICD-10-CM | POA: Insufficient documentation

## 2015-09-27 MED ORDER — HYDROCODONE-ACETAMINOPHEN 5-325 MG PO TABS: 1.0000 | ORAL_TABLET | ORAL | 0 refills | Status: DC | PRN

## 2015-09-27 MED ORDER — ACETAMINOPHEN 325 MG PO TABS
650.0000 mg | ORAL_TABLET | Freq: Once | ORAL | Status: AC
  Administered 2015-09-27: 650 mg via ORAL
  Filled 2015-09-27: qty 2

## 2015-09-27 NOTE — ED Triage Notes (Signed)
Patient reports that she has a really high bed and uses a stool to get in and out.  States that she was getting in and fell.  Patient complains of left wrist pain.  Fall happened at 2:30am.

## 2015-09-27 NOTE — ED Notes (Signed)
MD at bedside. 

## 2015-09-27 NOTE — ED Provider Notes (Addendum)
[facility] Emergency Department Provider Note   ____________________________________________   First MD Initiated Contact with Patient 09/27/15 [phone]     (approximate)  I have reviewed the triage vital signs and the nursing notes.   HISTORY  Chief Complaint Wrist Pain    HPI [name] [name] is a 82 y.o. female who comes into the hospital today with a concern that she broke her wrist. The patient reports that she fell getting into bed. The patient reports that she has a high bed and uses a stool to get in. She reports that she had 1 foot on the stool and missed the other foot getting on the stool and fell. She reports that she did not hit her head but put her arms out in front of her. The patient reports that this occurred around 2:30 AM. The patient denies any loss of consciousness, dizziness. She got up and put ice on it. She thought it was a bad sprain initially but the pain was worse so she decided to come in and get checked out. The pain is in her left wrist. She rates her pain a 6 out of 10 in intensity. The patient has no numbness or tingling.   Past Medical History:  Diagnosis Date  . COPD (chronic obstructive pulmonary disease) ([place])   . Diverticulitis   . GERD (gastroesophageal reflux disease)   . Glaucoma   . HLD (hyperlipidemia)   . Hypertension   . Rectal bleeding     Patient Active Problem List   Diagnosis Date Noted  . GI bleed 08/10/2015  . Symptomatic anemia 08/10/2015  . HTN (hypertension) 08/10/2015  . HLD (hyperlipidemia) 08/10/2015  . GERD (gastroesophageal reflux disease) 08/10/2015  . COPD (chronic obstructive pulmonary disease) ([place]) 08/10/2015    Past Surgical History:  Procedure Laterality Date  . ABDOMINAL HYSTERECTOMY    . APPENDECTOMY    . ESOPHAGOGASTRODUODENOSCOPY (EGD) WITH PROPOFOL N/A 08/12/2015   Procedure: ESOPHAGOGASTRODUODENOSCOPY (EGD) WITH PROPOFOL;  Surgeon: Lollie Sails, MD;  Location[facility]  ENDOSCOPY;  Service: Endoscopy;  Laterality: N/A;  . JOINT REPLACEMENT      Prior to Admission medications   Medication Sig Start Date End Date Taking? Authorizing Provider  acetaminophen (TYLENOL) 500 MG tablet Take 500-1,000 mg by mouth every 6 (six) hours as needed for mild pain.    Historical Provider, MD  albuterol (PROVENTIL HFA;VENTOLIN HFA) 108 (90 Base) MCG/ACT inhaler Inhale 2 puffs into the lungs every 4 (four) hours as needed for wheezing or shortness of breath.    Historical Provider, MD  chlorthalidone (HYGROTON) 25 MG tablet Take 12.5 mg by mouth daily.    Historical Provider, MD  cholecalciferol (VITAMIN D) 1000 units tablet Take 1,000 Units by mouth 2 (two) times daily.    Historical Provider, MD  cycloSPORINE (RESTASIS) 0.05 % ophthalmic emulsion Place 1 drop into both eyes 2 (two) times daily.    Historical Provider, MD  fluticasone (FLONASE) 50 MCG/ACT nasal spray Place 2 sprays into both nostrils daily as needed for allergies or rhinitis.    Historical Provider, MD  gabapentin (NEURONTIN) 300 MG capsule Take 300 mg by mouth 2 (two) times daily.    Historical Provider, MD  Javier Docker Oil 500 MG CAPS Take 500 mg by mouth daily.    Historical Provider, MD  losartan (COZAAR) 50 MG tablet Take 50 mg by mouth daily.    Historical Provider, MD  mometasone (ELOCON) 0.1 % lotion Apply 1 application topically daily as needed (  rash).    Historical Provider, MD  Multiple Vitamin (MULTIVITAMIN WITH MINERALS) TABS tablet Take 1 tablet by mouth daily.    Historical Provider, MD  Multiple Vitamins-Minerals (OCUVITE PRESERVISION PO) Take 1 tablet by mouth 2 (two) times daily.    Historical Provider, MD  nystatin (NYSTATIN) powder Apply 1 g topically 2 (two) times daily as needed (rash).    Historical Provider, MD  omeprazole (PRILOSEC) 40 MG capsule Take 40 mg by mouth daily.    Historical Provider, MD  PARoxetine (PAXIL) 40 MG tablet Take 40 mg by mouth every morning.    Historical Provider, MD    Polyethyl Glycol-Propyl Glycol (SYSTANE) 0.4-0.3 % SOLN Apply 1 drop to eye as needed.    Historical Provider, MD  potassium chloride (K-DUR,KLOR-CON) 10 MEQ tablet Take 10 mEq by mouth 2 (two) times daily.    Historical Provider, MD  sucralfate (CARAFATE) 1 g tablet Take 1 tablet (1 g total) by mouth 4 (four) times daily -  with meals and at bedtime. 08/13/15   Dustin Flock, MD  timolol (TIMOPTIC-XR) 0.5 % ophthalmic gel-forming Place 1 drop into both eyes daily.    Historical Provider, MD  valACYclovir (VALTREX) 1000 MG tablet Take 1,000 mg by mouth daily as needed (fever blister).    Historical Provider, MD    Allergies Codeine; Dextrans; Fentanyl; Lipitor [atorvastatin]; Mobic [meloxicam]; Niacin and related; Nsaids; Pravachol [pravastatin sodium]; Voltaren [diclofenac sodium]; and Zetia [ezetimibe]  Family History  Problem Relation Age of Onset  . CAD Mother   . CAD Father   . CAD Brother     Social History Social History  Substance Use Topics  . Smoking status: Former Research scientist (life sciences)  . Smokeless tobacco: Never Used  . Alcohol use No    Review of Systems Constitutional: No fever/chills Eyes: No visual changes. ENT: No sore throat. Cardiovascular: Denies chest pain. Respiratory: Denies shortness of breath. Gastrointestinal: No abdominal pain.  No nausea, no vomiting.  No diarrhea.  No constipation. Genitourinary: Negative for dysuria. Musculoskeletal: Left wrist pain Skin: Negative for rash. Neurological: Negative for headaches, focal weakness or numbness.  10-point ROS otherwise negative.  ____________________________________________   PHYSICAL EXAM:  VITAL SIGNS: ED Triage Vitals  Enc Vitals Group     BP 09/27/15 0621 (!) 142/96     Pulse Rate 09/27/15 0619 72     Resp 09/27/15 0619 16     Temp 09/27/15 0619 98.3 F (36.8 C)     Temp Source 09/27/15 0619 Oral     SpO2 09/27/15 0619 100 %     Weight 09/27/15 0620 190 lb (86.2 kg)     Height 09/27/15 0620 5\' 9"   (1.753 m)     Head Circumference --      Peak Flow --      Pain Score 09/27/15 0620 6     Pain Loc --      Pain Edu? --      Excl. in [place]? --     Constitutional: Alert and oriented. Well appearing and in Moderate distress. Eyes: Conjunctivae are normal. PERRL. EOMI. Head: Atraumatic. Nose: No congestion/rhinnorhea. Mouth/Throat: Mucous membranes are moist.  Oropharynx non-erythematous. Neck: No cervical spine tenderness to palpation. Cardiovascular: Normal rate, regular rhythm. Grossly normal heart sounds.  Good peripheral circulation. Respiratory: Normal respiratory effort.  No retractions. Lungs CTAB. Gastrointestinal: Soft and nontender. No distention. Positive bowel sounds Musculoskeletal: Left wrist swelling on the radial side with some tenderness to palpation and pain with passive range of motion,  no pain with some movement or axial thumb load. Neurologic:  Normal speech and language.  Skin:  Skin is warm, dry and intact. Marland Kitchen Psychiatric: Mood and affect are normal.   ____________________________________________   LABS (all labs ordered are listed, but only abnormal results are displayed)  Labs Reviewed - No data to display ____________________________________________  EKG  None ____________________________________________  RADIOLOGY  Left wrist x-ray ____________________________________________   PROCEDURES  Procedure(s) performed: please, see procedure note(s).  .Splint Application Date/Time: 123XX123 8:38 AM Performed by: Loney Hering Authorized by: Loney Hering   Consent:    Consent given by:  Patient   Alternatives discussed:  No treatment Pre-procedure details:    Sensation:  Normal Procedure details:    Laterality:  Left   Location:  Wrist   Wrist:  L wrist   Strapping: no     Splint type:  Thumb spica   Supplies:  Ortho-Glass Post-procedure details:    Pain:  Unchanged   Sensation:  Normal   Patient tolerance of procedure:   Tolerated well, no immediate complications    Critical Care performed: No  ____________________________________________   INITIAL IMPRESSION / ASSESSMENT AND PLAN / ED COURSE  Pertinent labs & imaging results that were available during my care of the patient were reviewed by me and considered in my medical decision making (see chart for details).  This is an 82 year old female who comes into the hospital today with some left wrist pain after a fall. The patient denies any pain in her neck or hitting her head. She thinks her wrist is broken. We'll do an x-ray and give the patient some medication. She reports that she only wants Tylenol does not want any other medications I will give the patient some Tylenol.  Clinical Course  Value Comment By Time  DG Wrist Complete Left 1. Angulated fracture of the distal left radius. Probable extension to the joint space is present.  2. Diffuse severe degenerative changes left wrist.   Loney Hering, MD 09/18 [phone]   The patient appears to have a distal radius fracture. I will place the patient in a thumb spica splint and have her follow-up with orthopedic surgery.  ____________________________________________   FINAL CLINICAL IMPRESSION(S) / ED DIAGNOSES  Final diagnoses:  Distal radius fracture, left, closed, initial encounter      NEW MEDICATIONS STARTED DURING THIS VISIT:  New Prescriptions   No medications on file     Note:  This document was prepared using Dragon voice recognition software and may include unintentional dictation errors.    Loney Hering, MD 09/27/15 [id number]    Loney Hering, MD 09/27/15 [phone]

## 2015-09-28 ENCOUNTER — Inpatient Hospital Stay: Payer: Medicare Other | Admitting: Oncology

## 2015-10-05 DIAGNOSIS — G9009 Other idiopathic peripheral autonomic neuropathy: Secondary | ICD-10-CM

## 2015-10-05 DIAGNOSIS — J441 Chronic obstructive pulmonary disease with (acute) exacerbation: Secondary | ICD-10-CM | POA: Diagnosis not present

## 2015-10-05 DIAGNOSIS — K219 Gastro-esophageal reflux disease without esophagitis: Secondary | ICD-10-CM | POA: Diagnosis not present

## 2015-10-05 DIAGNOSIS — F39 Unspecified mood [affective] disorder: Secondary | ICD-10-CM

## 2015-10-05 DIAGNOSIS — I10 Essential (primary) hypertension: Secondary | ICD-10-CM | POA: Diagnosis not present

## 2015-10-05 DIAGNOSIS — S62002A Unspecified fracture of navicular [scaphoid] bone of left wrist, initial encounter for closed fracture: Secondary | ICD-10-CM | POA: Diagnosis not present

## 2015-10-10 ENCOUNTER — Encounter: Payer: Self-pay | Admitting: Emergency Medicine

## 2015-10-10 ENCOUNTER — Emergency Department: Payer: Medicare Other

## 2015-10-10 ENCOUNTER — Inpatient Hospital Stay
Admission: EM | Admit: 2015-10-10 | Discharge: 2015-10-13 | DRG: 190 | Disposition: A | Discharge status: Home Health | Payer: Medicare Other | Attending: Internal Medicine | Admitting: Internal Medicine

## 2015-10-10 DIAGNOSIS — Z888 Allergy status to other drugs, medicaments and biological substances status: Secondary | ICD-10-CM | POA: Diagnosis not present

## 2015-10-10 DIAGNOSIS — Z8249 Family history of ischemic heart disease and other diseases of the circulatory system: Secondary | ICD-10-CM | POA: Diagnosis not present

## 2015-10-10 DIAGNOSIS — R0682 Tachypnea, not elsewhere classified: Secondary | ICD-10-CM

## 2015-10-10 DIAGNOSIS — Z79891 Long term (current) use of opiate analgesic: Secondary | ICD-10-CM | POA: Diagnosis not present

## 2015-10-10 DIAGNOSIS — Z885 Allergy status to narcotic agent status: Secondary | ICD-10-CM

## 2015-10-10 DIAGNOSIS — K649 Unspecified hemorrhoids: Secondary | ICD-10-CM | POA: Diagnosis present

## 2015-10-10 DIAGNOSIS — E785 Hyperlipidemia, unspecified: Secondary | ICD-10-CM | POA: Diagnosis present

## 2015-10-10 DIAGNOSIS — K2970 Gastritis, unspecified, without bleeding: Secondary | ICD-10-CM | POA: Diagnosis present

## 2015-10-10 DIAGNOSIS — E871 Hypo-osmolality and hyponatremia: Secondary | ICD-10-CM | POA: Diagnosis present

## 2015-10-10 DIAGNOSIS — A09 Infectious gastroenteritis and colitis, unspecified: Secondary | ICD-10-CM | POA: Diagnosis present

## 2015-10-10 DIAGNOSIS — B001 Herpesviral vesicular dermatitis: Secondary | ICD-10-CM | POA: Diagnosis present

## 2015-10-10 DIAGNOSIS — H409 Unspecified glaucoma: Secondary | ICD-10-CM | POA: Diagnosis present

## 2015-10-10 DIAGNOSIS — A3790 Whooping cough, unspecified species without pneumonia: Secondary | ICD-10-CM | POA: Diagnosis present

## 2015-10-10 DIAGNOSIS — Z8711 Personal history of peptic ulcer disease: Secondary | ICD-10-CM

## 2015-10-10 DIAGNOSIS — J302 Other seasonal allergic rhinitis: Secondary | ICD-10-CM | POA: Diagnosis present

## 2015-10-10 DIAGNOSIS — D638 Anemia in other chronic diseases classified elsewhere: Secondary | ICD-10-CM | POA: Diagnosis present

## 2015-10-10 DIAGNOSIS — M6281 Muscle weakness (generalized): Secondary | ICD-10-CM

## 2015-10-10 DIAGNOSIS — D509 Iron deficiency anemia, unspecified: Secondary | ICD-10-CM | POA: Diagnosis present

## 2015-10-10 DIAGNOSIS — Z87891 Personal history of nicotine dependence: Secondary | ICD-10-CM

## 2015-10-10 DIAGNOSIS — E876 Hypokalemia: Secondary | ICD-10-CM | POA: Diagnosis present

## 2015-10-10 DIAGNOSIS — Z79899 Other long term (current) drug therapy: Secondary | ICD-10-CM | POA: Diagnosis not present

## 2015-10-10 DIAGNOSIS — J440 Chronic obstructive pulmonary disease with acute lower respiratory infection: Principal | ICD-10-CM | POA: Diagnosis present

## 2015-10-10 DIAGNOSIS — F418 Other specified anxiety disorders: Secondary | ICD-10-CM | POA: Diagnosis present

## 2015-10-10 DIAGNOSIS — J189 Pneumonia, unspecified organism: Secondary | ICD-10-CM

## 2015-10-10 DIAGNOSIS — K219 Gastro-esophageal reflux disease without esophagitis: Secondary | ICD-10-CM | POA: Diagnosis present

## 2015-10-10 DIAGNOSIS — I10 Essential (primary) hypertension: Secondary | ICD-10-CM | POA: Diagnosis present

## 2015-10-10 DIAGNOSIS — Y95 Nosocomial condition: Secondary | ICD-10-CM | POA: Diagnosis present

## 2015-10-10 LAB — COMPREHENSIVE METABOLIC PANEL
ALBUMIN: 3.7 g/dL (ref 3.5–5.0)
ALT: 31 U/L (ref 14–54)
ANION GAP: 7 (ref 5–15)
AST: 25 U/L (ref 15–41)
Alkaline Phosphatase: 69 U/L (ref 38–126)
BUN: 17 mg/dL (ref 6–20)
CHLORIDE: 93 mmol/L — AB (ref 101–111)
CO2: 31 mmol/L (ref 22–32)
Calcium: 9.1 mg/dL (ref 8.9–10.3)
Creatinine, Ser: 0.79 mg/dL (ref 0.44–1.00)
GFR calc Af Amer: >60 mL/min (ref >60)
GFR calc non Af Amer: >60 mL/min (ref >60)
GLUCOSE: 117 mg/dL — AB (ref 65–99)
POTASSIUM: 3.2 mmol/L — AB (ref 3.5–5.1)
SODIUM: 131 mmol/L — AB (ref 135–145)
TOTAL PROTEIN: 8.1 g/dL (ref 6.5–8.1)
Total Bilirubin: 0.4 mg/dL (ref 0.3–1.2)

## 2015-10-10 LAB — CBC WITH DIFFERENTIAL/PLATELET
BAND NEUTROPHILS: 0 %
BASOS ABS: 0.4 10*3/uL — AB (ref 0–0.1)
BASOS PCT: 2 %
BLASTS: 0 %
EOS ABS: 0.8 10*3/uL — AB (ref 0–0.7)
Eosinophils Relative: 4 %
HCT: 24.8 % — ABNORMAL LOW (ref 35.0–47.0)
Hemoglobin: 8 g/dL — ABNORMAL LOW (ref 12.0–16.0)
LYMPHS PCT: 13 %
Lymphs Abs: 2.5 10*3/uL (ref 1.0–3.6)
MCH: 22.6 pg — ABNORMAL LOW (ref 26.0–34.0)
MCHC: 32.3 g/dL (ref 32.0–36.0)
MCV: 69.9 fL — ABNORMAL LOW (ref 80.0–100.0)
METAMYELOCYTES PCT: 0 %
MONO ABS: 1.4 10*3/uL — AB (ref 0.2–0.9)
MONOS PCT: 7 %
Myelocytes: 1 %
NEUTROS ABS: 14.5 10*3/uL — AB (ref 1.4–6.5)
Neutrophils Relative %: 73 %
OTHER: 0 %
PLATELETS: 662 10*3/uL — AB (ref 150–440)
Promyelocytes Absolute: 0 %
RBC: 3.55 MIL/uL — ABNORMAL LOW (ref 3.80–5.20)
RDW: 20.7 % — AB (ref 11.5–14.5)
WBC: 19.6 10*3/uL — ABNORMAL HIGH (ref 3.6–11.0)
nRBC: 0 /100 WBC

## 2015-10-10 LAB — TROPONIN I: Troponin I: <0.03 ng/mL (ref <0.03)

## 2015-10-10 LAB — LACTIC ACID, PLASMA: Lactic Acid, Venous: 1.4 mmol/L (ref 0.5–1.9)

## 2015-10-10 LAB — PHOSPHORUS: PHOSPHORUS: 3 mg/dL (ref 2.5–4.6)

## 2015-10-10 LAB — MAGNESIUM: MAGNESIUM: 2 mg/dL (ref 1.7–2.4)

## 2015-10-10 MED ORDER — GABAPENTIN 300 MG PO CAPS
300.0000 mg | ORAL_CAPSULE | Freq: Two times a day (BID) | ORAL | Status: DC
  Administered 2015-10-10 – 2015-10-13 (×6): 300 mg via ORAL
  Filled 2015-10-10 (×6): qty 1

## 2015-10-10 MED ORDER — CHLORTHALIDONE 25 MG PO TABS
12.5000 mg | ORAL_TABLET | Freq: Every day | ORAL | Status: DC
  Administered 2015-10-11 – 2015-10-13 (×3): 12.5 mg via ORAL
  Filled 2015-10-10: qty 0.5
  Filled 2015-10-10 (×2): qty 1

## 2015-10-10 MED ORDER — ALBUTEROL SULFATE (2.5 MG/3ML) 0.083% IN NEBU
5.0000 mg | INHALATION_SOLUTION | Freq: Once | RESPIRATORY_TRACT | Status: AC
  Administered 2015-10-10: 5 mg via RESPIRATORY_TRACT
  Filled 2015-10-10: qty 6

## 2015-10-10 MED ORDER — SENNOSIDES-DOCUSATE SODIUM 8.6-50 MG PO TABS
1.0000 | ORAL_TABLET | Freq: Every evening | ORAL | Status: DC | PRN
  Administered 2015-10-11: 1 via ORAL
  Filled 2015-10-10: qty 1

## 2015-10-10 MED ORDER — CYCLOSPORINE 0.05 % OP EMUL
1.0000 [drp] | Freq: Two times a day (BID) | OPHTHALMIC | Status: DC
  Administered 2015-10-10 – 2015-10-13 (×6): 1 [drp] via OPHTHALMIC
  Filled 2015-10-10 (×6): qty 1

## 2015-10-10 MED ORDER — DM-GUAIFENESIN ER 30-600 MG PO TB12: 1.0000 | ORAL_TABLET | Freq: Two times a day (BID) | ORAL | Status: DC

## 2015-10-10 MED ORDER — SODIUM CHLORIDE 0.9 % IV BOLUS (SEPSIS)
500.0000 mL | Freq: Once | INTRAVENOUS | Status: AC
  Administered 2015-10-10: 500 mL via INTRAVENOUS

## 2015-10-10 MED ORDER — ENOXAPARIN SODIUM 40 MG/0.4ML ~~LOC~~ SOLN
40.0000 mg | Freq: Every day | SUBCUTANEOUS | Status: DC
  Administered 2015-10-10 – 2015-10-11 (×2): 40 mg via SUBCUTANEOUS
  Filled 2015-10-10 (×2): qty 0.4

## 2015-10-10 MED ORDER — NYSTATIN 100000 UNIT/GM EX POWD: 1.0000 g | Freq: Two times a day (BID) | CUTANEOUS | Status: DC | PRN

## 2015-10-10 MED ORDER — ONDANSETRON HCL 4 MG PO TABS: 4.0000 mg | ORAL_TABLET | Freq: Four times a day (QID) | ORAL | Status: DC | PRN

## 2015-10-10 MED ORDER — ACETAMINOPHEN 650 MG RE SUPP
650.0000 mg | Freq: Four times a day (QID) | RECTAL | Status: DC | PRN
Start: 2015-10-10 — End: 2015-10-13

## 2015-10-10 MED ORDER — PANTOPRAZOLE SODIUM 40 MG PO TBEC: 40.0000 mg | DELAYED_RELEASE_TABLET | Freq: Every day | ORAL | Status: DC

## 2015-10-10 MED ORDER — SODIUM CHLORIDE 0.9 % IV SOLN
INTRAVENOUS | Status: DC
  Administered 2015-10-10 – 2015-10-12 (×3): via INTRAVENOUS

## 2015-10-10 MED ORDER — MOMETASONE FUROATE 0.1 % EX SOLN: 1.0000 "application " | Freq: Every day | CUTANEOUS | Status: DC | PRN

## 2015-10-10 MED ORDER — ADULT MULTIVITAMIN W/MINERALS CH
1.0000 | ORAL_TABLET | Freq: Every day | ORAL | Status: DC
  Administered 2015-10-11 – 2015-10-13 (×3): 1 via ORAL
  Filled 2015-10-10 (×3): qty 1

## 2015-10-10 MED ORDER — IPRATROPIUM-ALBUTEROL 0.5-2.5 (3) MG/3ML IN SOLN
3.0000 mL | Freq: Once | RESPIRATORY_TRACT | Status: AC
  Administered 2015-10-10: 3 mL via RESPIRATORY_TRACT
  Filled 2015-10-10: qty 3

## 2015-10-10 MED ORDER — MAGNESIUM CITRATE PO SOLN: 1.0000 | Freq: Once | ORAL | Status: DC | PRN

## 2015-10-10 MED ORDER — FLUTICASONE PROPIONATE 50 MCG/ACT NA SUSP: 2.0000 | Freq: Every day | NASAL | Status: DC | PRN

## 2015-10-10 MED ORDER — DEXTROSE 5 % IV SOLN
2.0000 g | Freq: Once | INTRAVENOUS | Status: AC
  Administered 2015-10-10: 2 g via INTRAVENOUS
  Filled 2015-10-10 (×2): qty 2

## 2015-10-10 MED ORDER — VITAMIN D 1000 UNITS PO TABS
1000.0000 [IU] | ORAL_TABLET | Freq: Two times a day (BID) | ORAL | Status: DC
  Administered 2015-10-10 – 2015-10-11 (×2): 1000 [IU] via ORAL
  Filled 2015-10-10 (×2): qty 1

## 2015-10-10 MED ORDER — POTASSIUM CHLORIDE CRYS ER 20 MEQ PO TBCR
10.0000 meq | EXTENDED_RELEASE_TABLET | Freq: Two times a day (BID) | ORAL | Status: DC
  Administered 2015-10-10: 10 meq via ORAL
  Filled 2015-10-10: qty 1

## 2015-10-10 MED ORDER — VANCOMYCIN HCL IN DEXTROSE 1-5 GM/200ML-% IV SOLN
1000.0000 mg | Freq: Once | INTRAVENOUS | Status: AC
  Administered 2015-10-10: 1000 mg via INTRAVENOUS
  Filled 2015-10-10: qty 200

## 2015-10-10 MED ORDER — TIMOLOL MALEATE 0.5 % OP SOLG
1.0000 [drp] | Freq: Every day | OPHTHALMIC | Status: DC
  Administered 2015-10-11 – 2015-10-12 (×2): 1 [drp] via OPHTHALMIC
  Filled 2015-10-10: qty 5

## 2015-10-10 MED ORDER — DEXTROMETHORPHAN POLISTIREX ER 30 MG/5ML PO SUER
30.0000 mg | Freq: Two times a day (BID) | ORAL | Status: DC
  Administered 2015-10-11: 30 mg via ORAL
  Filled 2015-10-10 (×7): qty 5

## 2015-10-10 MED ORDER — SUCRALFATE 1 G PO TABS
1.0000 g | ORAL_TABLET | Freq: Three times a day (TID) | ORAL | Status: DC
  Administered 2015-10-11 – 2015-10-13 (×10): 1 g via ORAL
  Filled 2015-10-10 (×10): qty 1

## 2015-10-10 MED ORDER — OMEGA-3-ACID ETHYL ESTERS 1 G PO CAPS
1000.0000 mg | ORAL_CAPSULE | Freq: Every day | ORAL | Status: DC
  Administered 2015-10-11 – 2015-10-13 (×3): 1000 mg via ORAL
  Filled 2015-10-10 (×3): qty 1

## 2015-10-10 MED ORDER — HYDROCODONE-ACETAMINOPHEN 5-325 MG PO TABS: 1.0000 | ORAL_TABLET | ORAL | Status: DC | PRN

## 2015-10-10 MED ORDER — ACETAMINOPHEN 325 MG PO TABS
650.0000 mg | ORAL_TABLET | Freq: Four times a day (QID) | ORAL | Status: DC | PRN
  Administered 2015-10-10 – 2015-10-13 (×5): 650 mg via ORAL
  Filled 2015-10-10 (×5): qty 2

## 2015-10-10 MED ORDER — DEXTROSE 5 % IV SOLN
1.0000 g | Freq: Three times a day (TID) | INTRAVENOUS | Status: DC
  Administered 2015-10-11: 1 g via INTRAVENOUS
  Filled 2015-10-10 (×3): qty 1

## 2015-10-10 MED ORDER — ONDANSETRON HCL 4 MG/2ML IJ SOLN: 4.0000 mg | Freq: Four times a day (QID) | INTRAMUSCULAR | Status: DC | PRN

## 2015-10-10 MED ORDER — PAROXETINE HCL 20 MG PO TABS
40.0000 mg | ORAL_TABLET | ORAL | Status: DC
  Administered 2015-10-11 – 2015-10-13 (×3): 40 mg via ORAL
  Filled 2015-10-10 (×4): qty 2

## 2015-10-10 MED ORDER — VALACYCLOVIR HCL 500 MG PO TABS: 1000.0000 mg | ORAL_TABLET | Freq: Every day | ORAL | Status: DC | PRN

## 2015-10-10 MED ORDER — LOSARTAN POTASSIUM 50 MG PO TABS
50.0000 mg | ORAL_TABLET | Freq: Every day | ORAL | Status: DC
  Administered 2015-10-11 – 2015-10-13 (×3): 50 mg via ORAL
  Filled 2015-10-10 (×3): qty 1

## 2015-10-10 MED ORDER — VANCOMYCIN HCL 10 G IV SOLR
1250.0000 mg | INTRAVENOUS | Status: DC
  Administered 2015-10-11 (×2): 1250 mg via INTRAVENOUS
  Filled 2015-10-10 (×3): qty 1250

## 2015-10-10 MED ORDER — BENZONATATE 100 MG PO CAPS
100.0000 mg | ORAL_CAPSULE | Freq: Once | ORAL | Status: AC
  Administered 2015-10-10: 100 mg via ORAL
  Filled 2015-10-10: qty 1

## 2015-10-10 MED ORDER — BISACODYL 5 MG PO TBEC: 5.0000 mg | DELAYED_RELEASE_TABLET | Freq: Every day | ORAL | Status: DC | PRN

## 2015-10-10 MED ORDER — IPRATROPIUM-ALBUTEROL 0.5-2.5 (3) MG/3ML IN SOLN: 3.0000 mL | Freq: Four times a day (QID) | RESPIRATORY_TRACT | Status: DC | PRN

## 2015-10-10 MED ORDER — GUAIFENESIN ER 600 MG PO TB12
600.0000 mg | ORAL_TABLET | Freq: Two times a day (BID) | ORAL | Status: DC
  Administered 2015-10-10 – 2015-10-13 (×6): 600 mg via ORAL
  Filled 2015-10-10 (×6): qty 1

## 2015-10-10 NOTE — ED Notes (Signed)
[name] [name], son-in-law would like to be contacted for change in pt status. H [phone], C [phone]

## 2015-10-10 NOTE — ED Provider Notes (Addendum)
[facility] Emergency Department Provider Note    First MD Initiated Contact with Patient 10/10/15 1853     (approximate)  I have reviewed the triage vital signs and the nursing notes.   HISTORY  Chief Complaint Shortness of Breath    HPI [name] [name] is a 82 y.o. female history of COPD as well as hypertension and recurrent GI bleeds secondary to peptic ulcers presents with recurrent shortness of breath, reactive cough and fatigue. Patient is coming from nursing facility. Has been treated as an outpatient for pneumonia but does not feel that she's getting any better. States she is also having recurrent diarrhea and having several episodes of melanotic despite recent endoscopy that showed no active bleeding. She is not on any blood thinners. He denies any active chest pain at this time. Her primary complaint is generalized weakness.   Past Medical History:  Diagnosis Date  . COPD (chronic obstructive pulmonary disease) ([place])   . Diverticulitis   . GERD (gastroesophageal reflux disease)   . Glaucoma   . HLD (hyperlipidemia)   . Hypertension   . Rectal bleeding     Patient Active Problem List   Diagnosis Date Noted  . GI bleed 08/10/2015  . Symptomatic anemia 08/10/2015  . HTN (hypertension) 08/10/2015  . HLD (hyperlipidemia) 08/10/2015  . GERD (gastroesophageal reflux disease) 08/10/2015  . COPD (chronic obstructive pulmonary disease) ([place]) 08/10/2015    Past Surgical History:  Procedure Laterality Date  . ABDOMINAL HYSTERECTOMY    . APPENDECTOMY    . ESOPHAGOGASTRODUODENOSCOPY (EGD) WITH PROPOFOL N/A 08/12/2015   Procedure: ESOPHAGOGASTRODUODENOSCOPY (EGD) WITH PROPOFOL;  Surgeon: Lollie Sails, MD;  Location: [facility] ENDOSCOPY;  Service: Endoscopy;  Laterality: N/A;  . JOINT REPLACEMENT      Prior to Admission medications   Medication Sig Start Date End Date Taking? Authorizing Provider  acetaminophen (TYLENOL) 500 MG tablet Take  500-1,000 mg by mouth every 6 (six) hours as needed for mild pain.    Historical Provider, MD  albuterol (PROVENTIL HFA;VENTOLIN HFA) 108 (90 Base) MCG/ACT inhaler Inhale 2 puffs into the lungs every 4 (four) hours as needed for wheezing or shortness of breath.    Historical Provider, MD  chlorthalidone (HYGROTON) 25 MG tablet Take 12.5 mg by mouth daily.    Historical Provider, MD  cholecalciferol (VITAMIN D) 1000 units tablet Take 1,000 Units by mouth 2 (two) times daily.    Historical Provider, MD  cycloSPORINE (RESTASIS) 0.05 % ophthalmic emulsion Place 1 drop into both eyes 2 (two) times daily.    Historical Provider, MD  fluticasone (FLONASE) 50 MCG/ACT nasal spray Place 2 sprays into both nostrils daily as needed for allergies or rhinitis.    Historical Provider, MD  gabapentin (NEURONTIN) 300 MG capsule Take 300 mg by mouth 2 (two) times daily.    Historical Provider, MD  HYDROcodone-acetaminophen (NORCO/VICODIN) 5-325 MG tablet Take 1 tablet by mouth every 4 (four) hours as needed for moderate pain. 09/27/15 09/26/16  Loney Hering, MD  Krill Oil 500 MG CAPS Take 500 mg by mouth daily.    Historical Provider, MD  losartan (COZAAR) 50 MG tablet Take 50 mg by mouth daily.    Historical Provider, MD  mometasone (ELOCON) 0.1 % lotion Apply 1 application topically daily as needed (rash).    Historical Provider, MD  Multiple Vitamin (MULTIVITAMIN WITH MINERALS) TABS tablet Take 1 tablet by mouth daily.    Historical Provider, MD  Multiple Vitamins-Minerals (OCUVITE PRESERVISION PO) Take  1 tablet by mouth 2 (two) times daily.    Historical Provider, MD  nystatin (NYSTATIN) powder Apply 1 g topically 2 (two) times daily as needed (rash).    Historical Provider, MD  omeprazole (PRILOSEC) 40 MG capsule Take 40 mg by mouth daily.    Historical Provider, MD  PARoxetine (PAXIL) 40 MG tablet Take 40 mg by mouth every morning.    Historical Provider, MD  Polyethyl Glycol-Propyl Glycol (SYSTANE) 0.4-0.3 %  SOLN Apply 1 drop to eye as needed.    Historical Provider, MD  potassium chloride (K-DUR,KLOR-CON) 10 MEQ tablet Take 10 mEq by mouth 2 (two) times daily.    Historical Provider, MD  sucralfate (CARAFATE) 1 g tablet Take 1 tablet (1 g total) by mouth 4 (four) times daily -  with meals and at bedtime. 08/13/15   Dustin Flock, MD  timolol (TIMOPTIC-XR) 0.5 % ophthalmic gel-forming Place 1 drop into both eyes daily.    Historical Provider, MD  valACYclovir (VALTREX) 1000 MG tablet Take 1,000 mg by mouth daily as needed (fever blister).    Historical Provider, MD    Allergies Codeine; Dextrans; Fentanyl; Lipitor [atorvastatin]; Mobic [meloxicam]; Niacin and related; Nsaids; Pravachol [pravastatin sodium]; Voltaren [diclofenac sodium]; and Zetia [ezetimibe]  Family History  Problem Relation Age of Onset  . CAD Mother   . CAD Father   . CAD Brother     Social History Social History  Substance Use Topics  . Smoking status: Former Research scientist (life sciences)  . Smokeless tobacco: Never Used  . Alcohol use No    Review of Systems Patient denies headaches, rhinorrhea, blurry vision, numbness, shortness of breath, chest pain, edema, cough, abdominal pain, nausea, vomiting, diarrhea, dysuria, fevers, rashes or hallucinations unless otherwise stated above in HPI. ____________________________________________   PHYSICAL EXAM:  VITAL SIGNS: Vitals:   10/10/15 1857 10/10/15 2104  BP: (!) 149/77   Pulse: 89   Resp: (!) 22   Temp: 98 F (36.7 C) 99.4 F (37.4 C)    Constitutional: Alert and oriented. Ill appearing female in moderate respiratory distress Eyes: Conjunctivae are normal. PERRL. EOMI. Head: Atraumatic. Nose: No congestion/rhinnorhea. Mouth/Throat: Mucous membranes are moist.  Oropharynx non-erythematous. Neck: No stridor. Painless ROM. No cervical spine tenderness to palpation Hematological/Lymphatic/Immunilogical: No cervical lymphadenopathy. Cardiovascular: Normal rate, regular rhythm.  Grossly normal heart sounds.  Good peripheral circulation. Respiratory: Tachypnea.  No retractions. Diminished breath sounds throughout Gastrointestinal: Soft and nontender. No distention. No abdominal bruits. No CVA tenderness.  Musculoskeletal: No lower extremity tenderness nor edema.  No joint effusions. Neurologic:  Normal speech and language. No gross focal neurologic deficits are appreciated. No gait instability. Skin:  Skin is warm, dry and intact. No rash noted. Psychiatric: Mood and affect are normal. Speech and behavior are normal.  ____________________________________________   LABS (all labs ordered are listed, but only abnormal results are displayed)  Results for orders placed or performed during the hospital encounter of 10/10/15 (from the past 24 hour(s))  CBC with Differential/Platelet     Status: Abnormal   Collection Time: 10/10/15  8:02 PM  Result Value Ref Range   WBC 19.6 (H) 3.6 - 11.0 K/uL   RBC 3.55 (L) 3.80 - 5.20 MIL/uL   Hemoglobin 8.0 (L) 12.0 - 16.0 g/dL   HCT 24.8 (L) 35.0 - 47.0 %   MCV 69.9 (L) 80.0 - 100.0 fL   MCH 22.6 (L) 26.0 - 34.0 pg   MCHC 32.3 32.0 - 36.0 g/dL   RDW 20.7 (H) 11.5 - 14.5 %  Platelets 662 (H) 150 - 440 K/uL   Neutrophils Relative % 73 %   Lymphocytes Relative 13 %   Monocytes Relative 7 %   Eosinophils Relative 4 %   Basophils Relative 2 %   Band Neutrophils 0 %   Metamyelocytes Relative 0 %   Myelocytes 1 %   Promyelocytes Absolute 0 %   Blasts 0 %   nRBC 0 0 /100 WBC   Other 0 %   Neutro Abs 14.5 (H) 1.4 - 6.5 K/uL   Lymphs Abs 2.5 1.0 - 3.6 K/uL   Monocytes Absolute 1.4 (H) 0.2 - 0.9 K/uL   Eosinophils Absolute 0.8 (H) 0 - 0.7 K/uL   Basophils Absolute 0.4 (H) 0 - 0.1 K/uL   Smear Review MORPHOLOGY UNREMARKABLE   Comprehensive metabolic panel     Status: Abnormal   Collection Time: 10/10/15  8:02 PM  Result Value Ref Range   Sodium 131 (L) 135 - 145 mmol/L   Potassium 3.2 (L) 3.5 - 5.1 mmol/L   Chloride 93  (L) 101 - 111 mmol/L   CO2 31 22 - 32 mmol/L   Glucose, Bld 117 (H) 65 - 99 mg/dL   BUN 17 6 - 20 mg/dL   Creatinine, Ser 0.79 0.44 - 1.00 mg/dL   Calcium 9.1 8.9 - 10.3 mg/dL   Total Protein 8.1 6.5 - 8.1 g/dL   Albumin 3.7 3.5 - 5.0 g/dL   AST 25 15 - 41 U/L   ALT 31 14 - 54 U/L   Alkaline Phosphatase 69 38 - 126 U/L   Total Bilirubin 0.4 0.3 - 1.2 mg/dL   GFR calc non Af Amer >60 >60 mL/min   GFR calc Af Amer >60 >60 mL/min   Anion gap 7 5 - 15  Troponin I     Status: None   Collection Time: 10/10/15  8:02 PM  Result Value Ref Range   Troponin I <0.03 <0.03 ng/mL  Lactic acid, plasma     Status: None   Collection Time: 10/10/15  8:02 PM  Result Value Ref Range   Lactic Acid, Venous 1.4 0.5 - 1.9 mmol/L  Magnesium     Status: None   Collection Time: 10/10/15  8:02 PM  Result Value Ref Range   Magnesium 2.0 1.7 - 2.4 mg/dL   ____________________________________________  EKG My review and personal interpretation at Time: 20:02   Indication: sob  Rate: 90  Rhythm: sinus Axis: normal Other: no acute ischemia ____________________________________________  RADIOLOGY  I personally reviewed all radiographic images ordered to evaluate for the above acute complaints and reviewed radiology reports and findings.  These findings were personally discussed with the patient.  Please see medical record for radiology report.  ____________________________________________   PROCEDURES  Procedure(s) performed: none    Critical Care performed:no CRITICAL CARE____________________________________________   INITIAL IMPRESSION / ASSESSMENT AND PLAN / ED COURSE  Pertinent labs & imaging results that were available during my care of the patient were reviewed by me and considered in my medical decision making (see chart for details).  DDX: Asthma, copd, CHF, pna, ptx, malignancy, Pe, anemia   [name] [name] is a 82 y.o. who presents to the ED with acute shortness of breath in  setting of known COPD. Patient recently treated for pneumonia with no improvement. Arrives in moderate respiratory distress. Symptoms did improve after albuterol treatments. Patient developed a low-grade fever and with her leukocytosis with left shift do feel patient's presentation is concerning for healthcare associated  pneumonia. We'll give broad spectrum antibiotics and continued nebulizer treatments. Patient will require admission to hospital for further evaluation and management.  Have discussed with the patient and available family all diagnostics and treatments performed thus far and all questions were answered to the best of my ability. The patient demonstrates understanding and agreement with plan.   Clinical Course     ____________________________________________   FINAL CLINICAL IMPRESSION(S) / ED DIAGNOSES  Final diagnoses:  HCAP (healthcare-associated pneumonia)  Tachypnea      NEW MEDICATIONS STARTED DURING THIS VISIT:  New Prescriptions   No medications on file     Note:  This document was prepared using Dragon voice recognition software and may include unintentional dictation errors.    Merlyn Lot, MD 10/10/15 2119    Merlyn Lot, MD 10/10/15 2120

## 2015-10-10 NOTE — Progress Notes (Signed)
Pharmacy Antibiotic Note  [name] [name] is a 82 y.o. female admitted on 10/10/2015 with pneumonia.  Pharmacy has been consulted for cefepime and vancomycin dosing.  Plan: 1. Cefepime 2 gm IV x 1 in ED followed by cefepime 1 gm IV Q8H 2. Vancomycin 1 gm IV x 1 in ED followed in 6 hours (stacked dosing) by vancomycin 1.25 gm IV Q18H, predicted trough 16 mcg/mL. Pharmacy will continue to follow and adjust as needed to maintain trough 15 to 20 mcg/mL.   Vd 51.7 L, Ke 0.051 hr-1, T1/2 13.5 hr  Height: 5\' 9"  (175.3 cm) Weight: 190 lb (86.2 kg) IBW/kg (Calculated) : 66.2  Temp (24hrs), Avg:98.7 F (37.1 C), Min:98 F (36.7 C), Max:99.4 F (37.4 C)   Recent Labs Lab 10/10/15 2002  WBC 19.6*  CREATININE 0.79  LATICACIDVEN 1.4    Estimated Creatinine Clearance: 56.9 mL/min (by C-G formula based on SCr of 0.79 mg/dL).    Allergies  Allergen Reactions  . Codeine Nausea And Vomiting  . Dextrans Hives  . Fentanyl Itching  . Lipitor [Atorvastatin] Other (See Comments)    Reaction: Muscle pain  . Mobic [Meloxicam] Other (See Comments)    Reaction: Mouth and tongue ulcers  . Niacin And Related Itching  . Nsaids Hives  . Pravachol [Pravastatin Sodium] Other (See Comments)    Reaction: Muscle pain  . Voltaren [Diclofenac Sodium] Other (See Comments)    Reaction: Mouth and tongue ulcers  . Zetia [Ezetimibe] Other (See Comments)    Reaction: Leg pain    Thank you for allowing pharmacy to be a part of this patient's care.  Laural Benes, Pharm.D., BCPS Clinical Pharmacist 10/10/2015 9:44 PM

## 2015-10-10 NOTE — ED Triage Notes (Addendum)
Pt arrived by ED with c/o of SOB and cough. Pt was seen here on Tues and diagnosed with pneumonia and states she feels as if she as continued to decline. Pt has broken left arm from previous fall.

## 2015-10-11 LAB — BASIC METABOLIC PANEL
Anion gap: 4 — ABNORMAL LOW (ref 5–15)
BUN: 16 mg/dL (ref 6–20)
CHLORIDE: 97 mmol/L — AB (ref 101–111)
CO2: 31 mmol/L (ref 22–32)
CREATININE: 0.77 mg/dL (ref 0.44–1.00)
Calcium: 8.4 mg/dL — ABNORMAL LOW (ref 8.9–10.3)
GFR calc Af Amer: >60 mL/min (ref >60)
GFR calc non Af Amer: >60 mL/min (ref >60)
Glucose, Bld: 112 mg/dL — ABNORMAL HIGH (ref 65–99)
Potassium: 3.5 mmol/L (ref 3.5–5.1)
SODIUM: 132 mmol/L — AB (ref 135–145)

## 2015-10-11 LAB — CBC
HCT: 20.7 % — ABNORMAL LOW (ref 35.0–47.0)
HEMATOCRIT: 25.2 % — AB (ref 35.0–47.0)
Hemoglobin: 6.5 g/dL — ABNORMAL LOW (ref 12.0–16.0)
Hemoglobin: 8.1 g/dL — ABNORMAL LOW (ref 12.0–16.0)
MCH: 22.2 pg — AB (ref 26.0–34.0)
MCH: 23.4 pg — ABNORMAL LOW (ref 26.0–34.0)
MCHC: 31.4 g/dL — ABNORMAL LOW (ref 32.0–36.0)
MCHC: 32.2 g/dL (ref 32.0–36.0)
MCV: 70.7 fL — AB (ref 80.0–100.0)
MCV: 72.5 fL — ABNORMAL LOW (ref 80.0–100.0)
PLATELETS: 552 10*3/uL — AB (ref 150–440)
PLATELETS: 584 10*3/uL — AB (ref 150–440)
RBC: 2.93 MIL/uL — ABNORMAL LOW (ref 3.80–5.20)
RBC: 3.48 MIL/uL — ABNORMAL LOW (ref 3.80–5.20)
RDW: 20.1 % — AB (ref 11.5–14.5)
RDW: 22.3 % — ABNORMAL HIGH (ref 11.5–14.5)
WBC: 15.2 10*3/uL — AB (ref 3.6–11.0)
WBC: 14.3 10*3/uL — ABNORMAL HIGH (ref 3.6–11.0)

## 2015-10-11 LAB — IRON AND TIBC
IRON: 7 ug/dL — AB (ref 28–170)
Saturation Ratios: 2 % — ABNORMAL LOW (ref 10.4–31.8)
TIBC: 307 ug/dL (ref 250–450)
UIBC: 300 ug/dL

## 2015-10-11 LAB — FOLATE: Folate: 30 ng/mL (ref >5.9)

## 2015-10-11 LAB — PREPARE RBC (CROSSMATCH)

## 2015-10-11 LAB — MRSA PCR SCREENING: MRSA BY PCR: NEGATIVE

## 2015-10-11 LAB — FERRITIN: Ferritin: 28 ng/mL (ref 11–307)

## 2015-10-11 LAB — MAGNESIUM: Magnesium: 2 mg/dL (ref 1.7–2.4)

## 2015-10-11 LAB — C DIFFICILE QUICK SCREEN W PCR REFLEX
C Diff antigen: NEGATIVE
C Diff interpretation: NOT DETECTED
C Diff toxin: NEGATIVE

## 2015-10-11 LAB — VITAMIN B12: Vitamin B-12: 6335 pg/mL — ABNORMAL HIGH (ref 180–914)

## 2015-10-11 MED ORDER — FERROUS SULFATE 325 (65 FE) MG PO TABS
325.0000 mg | ORAL_TABLET | Freq: Three times a day (TID) | ORAL | Status: DC
  Administered 2015-10-11 – 2015-10-12 (×4): 325 mg via ORAL
  Filled 2015-10-11 (×6): qty 1

## 2015-10-11 MED ORDER — COENZYME Q10 100 MG PO CAPS: 200.0000 mg | ORAL_CAPSULE | Freq: Every day | ORAL | Status: DC

## 2015-10-11 MED ORDER — SODIUM CHLORIDE 0.9 % IV SOLN
Freq: Once | INTRAVENOUS | Status: AC
  Administered 2015-10-11: 13:00:00 via INTRAVENOUS

## 2015-10-11 MED ORDER — VITAMIN B-12 1000 MCG PO TABS
1000.0000 ug | ORAL_TABLET | Freq: Two times a day (BID) | ORAL | Status: DC
  Administered 2015-10-11: 1000 ug via ORAL
  Filled 2015-10-11: qty 1

## 2015-10-11 MED ORDER — PANTOPRAZOLE SODIUM 40 MG PO TBEC
40.0000 mg | DELAYED_RELEASE_TABLET | Freq: Two times a day (BID) | ORAL | Status: DC
  Administered 2015-10-11 – 2015-10-13 (×5): 40 mg via ORAL
  Filled 2015-10-11 (×5): qty 1

## 2015-10-11 MED ORDER — POTASSIUM CHLORIDE CRYS ER 20 MEQ PO TBCR
40.0000 meq | EXTENDED_RELEASE_TABLET | Freq: Two times a day (BID) | ORAL | Status: AC
  Administered 2015-10-11 – 2015-10-12 (×3): 40 meq via ORAL
  Filled 2015-10-11 (×3): qty 2

## 2015-10-11 MED ORDER — GUAIFENESIN ER 600 MG PO TB12
600.0000 mg | ORAL_TABLET | Freq: Two times a day (BID) | ORAL | Status: DC | PRN
  Administered 2015-10-11: 600 mg via ORAL
  Filled 2015-10-11: qty 1

## 2015-10-11 MED ORDER — FUROSEMIDE 10 MG/ML IJ SOLN
20.0000 mg | Freq: Once | INTRAMUSCULAR | Status: AC
  Administered 2015-10-11: 20 mg via INTRAVENOUS
  Filled 2015-10-11 (×2): qty 2

## 2015-10-11 MED ORDER — DEXTROSE 5 % IV SOLN
2.0000 g | Freq: Two times a day (BID) | INTRAVENOUS | Status: DC
  Administered 2015-10-11 – 2015-10-13 (×4): 2 g via INTRAVENOUS
  Filled 2015-10-11 (×7): qty 2

## 2015-10-11 MED ORDER — BENZONATATE 100 MG PO CAPS
100.0000 mg | ORAL_CAPSULE | Freq: Three times a day (TID) | ORAL | Status: DC | PRN
  Administered 2015-10-11 – 2015-10-13 (×3): 100 mg via ORAL
  Filled 2015-10-11 (×3): qty 1

## 2015-10-11 MED ORDER — LORATADINE 10 MG PO TABS: 10.0000 mg | ORAL_TABLET | Freq: Every day | ORAL | Status: DC | PRN

## 2015-10-11 NOTE — Progress Notes (Signed)
Pharmacy Antibiotic Note  [name] [name] is a 82 y.o. female admitted on 10/10/2015 with pneumonia.  Pharmacy has been consulted for cefepime and vancomycin dosing.  Plan: 1. Cefepime 1 gm IV Q8H currently ordered. Based on renal function and indication, will adjust to cefepime 2 g IV q12h.  2. Vancomycin 1 gm IV x 1 in ED followed in 6 hours (stacked dosing) by vancomycin 1.25 gm IV Q18H, predicted trough 16 mcg/mL. Pharmacy will continue to follow and adjust as needed to maintain trough 15 to 20 mcg/mL.  Trough before 4th dose of regimen.   Vd 51.7 L, Ke 0.051 hr-1, T1/2 13.5 hr  Height: 5\' 9"  (175.3 cm) Weight: 183 lb 9.6 oz (83.3 kg) IBW/kg (Calculated) : 66.2  Temp (24hrs), Avg:98.7 F (37.1 C), Min:98 F (36.7 C), Max:99.4 F (37.4 C)   Recent Labs Lab 10/10/15 2002 10/11/15 0334  WBC 19.6* 15.2*  CREATININE 0.79 0.77  LATICACIDVEN 1.4  --     Estimated Creatinine Clearance: 56 mL/min (by C-G formula based on SCr of 0.77 mg/dL).    Allergies  Allergen Reactions  . Codeine Nausea And Vomiting  . Dextrans Hives  . Fentanyl Itching  . Lipitor [Atorvastatin] Other (See Comments)    Reaction: Muscle pain  . Mobic [Meloxicam] Other (See Comments)    Reaction: Mouth and tongue ulcers  . Niacin And Related Itching  . Nsaids Hives  . Pravachol [Pravastatin Sodium] Other (See Comments)    Reaction: Muscle pain  . Voltaren [Diclofenac Sodium] Other (See Comments)    Reaction: Mouth and tongue ulcers  . Zetia [Ezetimibe] Other (See Comments)    Reaction: Leg pain    Thank you for allowing pharmacy to be a part of this patient's care.  Rocky Morel, Pharm.D., BCPS Clinical Pharmacist 10/11/2015 11:30 AM

## 2015-10-11 NOTE — Progress Notes (Signed)
PHARMACIST - PHYSICIAN ORDER COMMUNICATION  CONCERNING: P&T Medication Policy on Herbal Medications  DESCRIPTION:  This patient's order for:  Co Enzyme Q10  has been noted.  This product(s) is classified as an "herbal" or natural product. Due to a lack of definitive safety studies or FDA approval, nonstandard manufacturing practices, plus the potential risk of unknown drug-drug interactions while on inpatient medications, the Pharmacy and Therapeutics Committee does not permit the use of "herbal" or natural products of this type within [place].   ACTION TAKEN: The pharmacy department is unable to verify this order at this time Please reevaluate patient's clinical condition at discharge and address if the herbal or natural product(s) should be resumed at that time.   

## 2015-10-11 NOTE — H&P (Signed)
[place] @ [facility] Admission History and Physical [employer], D.O.  ---------------------------------------------------------------------------------------------------------------------   PATIENT NAME: [name] [name] MR#: [id number] DATE OF BIRTH: Nov 24, 1925 DATE OF ADMISSION: 10/10/2015 PRIMARY CARE PHYSICIAN: Gearldine Shown, DO  REQUESTING/REFERRING PHYSICIAN: ED Dr. Quentin Cornwall  CHIEF COMPLAINT: Chief Complaint  Patient presents with  . Shortness of Breath    HISTORY OF PRESENT ILLNESS: [name] [name] is a 82 y.o. female with a known history of COPD, hypertension, hyperlipidemia, rectal bleeding, diverticulitis, GERD presents to the emergency department complaining of shortness of breath associated with a productive cough, weakness, fatigue and generalized aches and pains..  Patient was in a usual state of health until 3 days ago when she started having symptoms of cough which progressed to shortness of breath, fevers and myalgias. She states that she has been complaining to her nursing facility about the symptoms and was treated with Augmentin and Claritin for seasonal allergies.  Of note patient has recently had melanotic stools and was worked up by GI including an endoscopy within the past 2 weeks. She denies a rectal bleeding at present but she has had 5-6 episodes of loose watery diarrhea in the past 24 hours. She denies any abdominal pain  Otherwise there has been no change in status. Patient has been taking medication as prescribed and there has been no recent change in medication or diet.  There has been no recent illness, travel or sick contacts.    Patient denies fevers/chills, weakness, dizziness, chest pain, N/V/C/D, abdominal pain, dysuria/frequency, changes in mental status.   EMS/ED COURSE:   Patient received Maxipime and vancomycin  PAST MEDICAL HISTORY: Past Medical History:  Diagnosis Date  . COPD (chronic obstructive pulmonary  disease) ([place])   . Diverticulitis   . GERD (gastroesophageal reflux disease)   . Glaucoma   . HLD (hyperlipidemia)   . Hypertension   . Rectal bleeding       PAST SURGICAL HISTORY: Past Surgical History:  Procedure Laterality Date  . ABDOMINAL HYSTERECTOMY    . APPENDECTOMY    . ESOPHAGOGASTRODUODENOSCOPY (EGD) WITH PROPOFOL N/A 08/12/2015   Procedure: ESOPHAGOGASTRODUODENOSCOPY (EGD) WITH PROPOFOL;  Surgeon: Lollie Sails, MD;  Location[facility] END[facility]COPY;  Service: Endoscopy;  Laterality: N/A;  . JOINT REPLACEMENT        SOCIAL HISTORY: Social History  Substance Use Topics  . Smoking status: Former Research scientist (life sciences)  . Smokeless tobacco: Never Used  . Alcohol use No      FAMILY HISTORY: Family History  Problem Relation Age of Onset  . CAD Mother   . CAD Father   . CAD Brother      MEDICATIONS AT HOME: Prior to Admission medications   Medication Sig Start Date End Date Taking? Authorizing Provider  acetaminophen (TYLENOL) 500 MG tablet Take 500-1,000 mg by mouth every 6 (six) hours as needed for mild pain.   Yes Historical Provider, MD  acetaminophen (TYLENOL) 650 MG CR tablet Take 650 mg by mouth every 8 (eight) hours as needed for pain.   Yes Historical Provider, MD  albuterol (PROVENTIL HFA;VENTOLIN HFA) 108 (90 Base) MCG/ACT inhaler Inhale 2 puffs into the lungs every 4 (four) hours as needed for wheezing or shortness of breath.   Yes Historical Provider, MD  amoxicillin-clavulanate (AUGMENTIN) 875-125 MG tablet Take 1 tablet by mouth 2 (two) times daily.   Yes Historical Provider, MD  chlorthalidone (HYGROTON) 25 MG tablet Take 12.5 mg by mouth daily.   Yes Historical Provider, MD  Coenzyme Q10 (GNP  CO Q10) 100 MG capsule Take 200 mg by mouth daily.   Yes Historical Provider, MD  Cyanocobalamin (B-12) 1000 MCG CAPS Take 2,000 Units by mouth 2 (two) times daily.   Yes Historical Provider, MD  cycloSPORINE (RESTASIS) 0.05 % ophthalmic emulsion Place 1 drop into both eyes 2  (two) times daily.   Yes Historical Provider, MD  gabapentin (NEURONTIN) 300 MG capsule Take 300 mg by mouth 2 (two) times daily.   Yes Historical Provider, MD  guaiFENesin (MUCINEX) 600 MG 12 hr tablet Take 600 mg by mouth 2 (two) times daily as needed.   Yes Historical Provider, MD  HYDROcodone-acetaminophen (NORCO/VICODIN) 5-325 MG tablet Take 1 tablet by mouth every 4 (four) hours as needed for moderate pain. 09/27/15 09/26/16 Yes Loney Hering, MD  loperamide (IMODIUM) 2 MG capsule Take 2 mg by mouth as needed for diarrhea or loose stools.   Yes Historical Provider, MD  loratadine (CLARITIN) 10 MG tablet Take 10 mg by mouth daily as needed for allergies.   Yes Historical Provider, MD  losartan (COZAAR) 50 MG tablet Take 50 mg by mouth daily.   Yes Historical Provider, MD  Multiple Vitamins-Minerals (OCUVITE PRESERVISION PO) Take 1 tablet by mouth 2 (two) times daily.   Yes Historical Provider, MD  PARoxetine (PAXIL) 40 MG tablet Take 40 mg by mouth every morning.   Yes Historical Provider, MD  Polyethyl Glycol-Propyl Glycol (SYSTANE) 0.4-0.3 % SOLN Apply 1 drop to eye as needed.   Yes Historical Provider, MD  potassium chloride (K-DUR,KLOR-CON) 10 MEQ tablet Take 10 mEq by mouth 2 (two) times daily.   Yes Historical Provider, MD  RABEprazole (ACIPHEX) 20 MG tablet Take 20 mg by mouth 2 (two) times daily.   Yes Historical Provider, MD  sucralfate (CARAFATE) 1 g tablet Take 1 tablet (1 g total) by mouth 4 (four) times daily -  with meals and at bedtime. 08/13/15  Yes Dustin Flock, MD  timolol (TIMOPTIC-XR) 0.5 % ophthalmic gel-forming Place 1 drop into both eyes daily.   Yes Historical Provider, MD      DRUG ALLERGIES: Allergies  Allergen Reactions  . Codeine Nausea And Vomiting  . Dextrans Hives  . Fentanyl Itching  . Lipitor [Atorvastatin] Other (See Comments)    Reaction: Muscle pain  . Mobic [Meloxicam] Other (See Comments)    Reaction: Mouth and tongue ulcers  . Niacin And  Related Itching  . Nsaids Hives  . Pravachol [Pravastatin Sodium] Other (See Comments)    Reaction: Muscle pain  . Voltaren [Diclofenac Sodium] Other (See Comments)    Reaction: Mouth and tongue ulcers  . Zetia [Ezetimibe] Other (See Comments)    Reaction: Leg pain     REVIEW OF SYSTEMS: CONSTITUTIONAL: Positive fatigue, weakness, fever, chills, negative weight gain/loss, headache EYES: No blurry or double vision. ENT: No tinnitus, postnasal drip, redness or soreness of the oropharynx. RESPIRATORY: Positive dyspnea, cough, wheeze, negative hemoptysis. CARDIOVASCULAR: No chest pain, orthopnea, palpitations, syncope. GASTROINTESTINAL: No nausea, vomiting, constipation, diarrhea, abdominal pain. No hematemesis, melena or hematochezia. GENITOURINARY: No dysuria, frequency, hematuria. ENDOCRINE: No polyuria or nocturia. No heat or cold intolerance. HEMATOLOGY: No anemia, bruising, bleeding. INTEGUMENTARY: No rashes, ulcers, lesions. MUSCULOSKELETAL: No pain, arthritis, swelling, gout. NEUROLOGIC: No numbness, tingling, weakness or ataxia. No seizure-type activity. PSYCHIATRIC: No anxiety, depression, insomnia.  PHYSICAL EXAMINATION: VITAL SIGNS: Blood pressure (!) 156/51, pulse 91, temperature 98.7 F (37.1 C), temperature source Oral, resp. rate 20, height 5\' 9"  (1.753 m), weight 83.3 kg (183 lb  9.6 oz), SpO2 100 %.  GENERAL: 82 y.o.-year-old white female patient, well-developed, well-nourished lying in the bed in no acute distress.  Pleasant and cooperative.   HEENT: Head atraumatic, normocephalic. Pupils equal, round, reactive to light and accommodation. No scleral icterus. Extraocular muscles intact. Oropharynx is clear. Mucus membranes moist. Voice is hoarse NECK: Supple, full range of motion. No JVD, no bruit heard. No cervical lymphadenopathy. CHEST: Decreased breath sounds with slight bibasilar crackles No wheezing, rales, rhonchi or crackles. No use of accessory muscles of  respiration.  No reproducible chest wall tenderness.  CARDIOVASCULAR: S1, S2 normal. No murmurs, rubs, or gallops appreciated. Cap refill <2 seconds. ABDOMEN: Soft, nontender, nondistended. No rebound, guarding, rigidity. Normoactive bowel sounds present in all four quadrants. No organomegaly or mass. EXTREMITIES: Full range of motion. Mild pitting edema of the bilateral lower extremities to the ankles NEUROLOGIC: Cranial nerves II through XII are grossly intact with no focal sensorimotor deficit. Muscle strength 5/5 in all extremities. Sensation intact. Gait not checked. PSYCHIATRIC: The patient is alert and oriented x 3. Normal affect, mood, thought content. SKIN: Warm, dry, and intact without obvious rash, lesion, or ulcer.  LABORATORY PANEL:  CBC  Recent Labs Lab 10/10/15 2002  WBC 19.6*  HGB 8.0*  HCT 24.8*  PLT 662*   ----------------------------------------------------------------------------------------------------------------- Chemistries  Recent Labs Lab 10/10/15 2002  NA 131*  K 3.2*  CL 93*  CO2 31  GLUCOSE 117*  BUN 17  CREATININE 0.79  CALCIUM 9.1  MG 2.0  AST 25  ALT 31  ALKPHOS 69  BILITOT 0.4   ------------------------------------------------------------------------------------------------------------------ Cardiac Enzymes  Recent Labs Lab 10/10/15 2002  TROPONINI <0.03   ------------------------------------------------------------------------------------------------------------------  RADIOLOGY: Dg Chest 2 View  Result Date: 10/10/2015 CLINICAL DATA:  Shortness of breath. EXAM: CHEST  2 VIEW COMPARISON:  None. FINDINGS: The heart size and mediastinal contours are within normal limits. Both lungs are clear. No pneumothorax or pleural effusion is noted. Atherosclerosis of thoracic aorta is noted. The visualized skeletal structures are unremarkable. IMPRESSION: No active cardiopulmonary disease.  Aortic atherosclerosis. Electronically Signed   By:  Marijo Conception, M.D.   On: 10/10/2015 19:44    EKG: Normal sinus rhythm at 90 bpm bpm with normal axis and nonspecific ST-T wave changes.   IMPRESSION AND PLAN:  This is a 82 y.o. female with a history of  COPD, hypertension, hyperlipidemia, rectal bleeding, diverticulitis, GERD  now being admitted with: 1. Healthcare associated pneumonia-given patient's symptoms, exam, leukocytosis and risk factors we'll admit for treatment with IV antibiotics including cefepime and vancomycin, blood and sputum cultures, nebulizers, O2 therapy, expectorants. 2. Diarrhea-with significant leukocytosis will obtain stool studies including C. difficile. Contact precautions until ruled out 3. Hyponatremia-we'll replace with IV normal saline 4. Hypokalemia we will replace orally 5. Anemia-we'll check FOBT given recent GI bleeding as well as B12, folate, iron studies 6. History of COPD-continue nebulizers 7. History of hypertension-continue chlorthalidone and losartan 8. History of anxiety depression-continue Paxil 8. History of glaucoma-continue Restasis, Ocuvite and Timoptic  Diet/Nutrition: Heart healthy Fluids: IV normal saline DVT Px: Lovenox, SCDs and early ambulation Code Status: Full  All the records are reviewed and case discussed with ED provider. Management plans discussed with the patient and/or family who express understanding and agree with plan of care.   TOTAL TIME TAKING CARE OF THIS PATIENT: 60 minutes.   Laban Orourke D.O. on 10/11/2015 at 12:03 AM Between 7am to 6pm - Pager - [phone] After 6pm go to [email or web address] - password  [facility] [place] [facility] Office [phone] CC: Primary care physician; Arrie Aran PATRICIA, DO     Note: This dictation was prepared with Dragon dictation along with smaller phrase technology. Any transcriptional errors that result from this process are unintentional.

## 2015-10-11 NOTE — Care Management (Signed)
Re-admit from August 2017. PT recommending home health PT and patient declined. PT evaluation would be helpful again. RNCM will follow.

## 2015-10-11 NOTE — Progress Notes (Signed)
PT Cancellation Note  Patient Details Name: [name] [name] MRN: [id number] DOB: May 08, 1925   Cancelled Treatment:     Pt contraindicated for PT services at this time with HgB 6.5 and HCT 20.7.  Will assess for PT eval as appropriate at a future date.    Linus Salmons PT, DPT 10/11/15, 2:11 PM

## 2015-10-11 NOTE — Progress Notes (Signed)
[facility] [place] NAME: [name] [name]    MR#:  [id number]  DATE OF BIRTH:  April 09, 1925  SUBJECTIVE:  CHIEF COMPLAINT:   Chief Complaint  Patient presents with  . Shortness of Breath   Better SOB, still a lot of cough and weakness. REVIEW OF SYSTEMS:  Review of Systems  Constitutional: Negative for chills and fever.  HENT: Negative for sore throat.   Eyes: Negative for blurred vision and double vision.  Respiratory: Positive for cough and shortness of breath. Negative for hemoptysis, sputum production, wheezing and stridor.   Cardiovascular: Negative for chest pain, palpitations and leg swelling.  Gastrointestinal: Negative for abdominal pain, blood in stool, melena, nausea and vomiting.  Genitourinary: Negative for dysuria, hematuria and urgency.  Musculoskeletal: Negative for joint pain.  Skin: Negative for itching and rash.  Neurological: Negative for dizziness, seizures, loss of consciousness and headaches.  Psychiatric/Behavioral: Negative for depression. The patient is not nervous/anxious.     DRUG ALLERGIES:   Allergies  Allergen Reactions  . Codeine Nausea And Vomiting  . Dextrans Hives  . Fentanyl Itching  . Lipitor [Atorvastatin] Other (See Comments)    Reaction: Muscle pain  . Mobic [Meloxicam] Other (See Comments)    Reaction: Mouth and tongue ulcers  . Niacin And Related Itching  . Nsaids Hives  . Pravachol [Pravastatin Sodium] Other (See Comments)    Reaction: Muscle pain  . Voltaren [Diclofenac Sodium] Other (See Comments)    Reaction: Mouth and tongue ulcers  . Zetia [Ezetimibe] Other (See Comments)    Reaction: Leg pain   VITALS:  Blood pressure (!) 158/63, pulse 94, temperature 97.7 F (36.5 C), temperature source Axillary, resp. rate 18, height 5\' 9"  (1.753 m), weight 183 lb 9.6 oz (83.3 kg), SpO2 99 %. PHYSICAL EXAMINATION:  Physical Exam  Constitutional: She is oriented to person, place, and time  and well-developed, well-nourished, and in no distress. No distress.  HENT:  Head: Normocephalic.  Mouth/Throat: Oropharynx is clear and moist.  Eyes: Conjunctivae and EOM are normal. Scleral icterus is present.  Neck: Normal range of motion. Neck supple. No JVD present.  Cardiovascular: Normal rate, regular rhythm and normal heart sounds.  Exam reveals no gallop.   No murmur heard. Pulmonary/Chest: Effort normal and breath sounds normal. No respiratory distress. She has no wheezes. She has no rales.  Abdominal: Soft. Bowel sounds are normal. She exhibits no distension. There is no tenderness.  Musculoskeletal: Normal range of motion. She exhibits no edema or tenderness.  Lymphadenopathy:    She has no cervical adenopathy.  Neurological: She is alert and oriented to person, place, and time. No cranial nerve deficit.  Skin: Skin is warm.  Psychiatric: Mood, memory, affect and judgment normal.   LABORATORY PANEL:   CBC  Recent Labs Lab 10/11/15 0334  WBC 15.2*  HGB 6.5*  HCT 20.7*  PLT 552*   ------------------------------------------------------------------------------------------------------------------ Chemistries   Recent Labs Lab 10/10/15 2002 10/11/15 0334 10/11/15 0754  NA 131* 132*  --   K 3.2* 3.5  --   CL 93* 97*  --   CO2 31 31  --   GLUCOSE 117* 112*  --   BUN 17 16  --   CREATININE 0.79 0.77  --   CALCIUM 9.1 8.4*  --   MG 2.0  --  2.0  AST 25  --   --   ALT 31  --   --   ALKPHOS 69  --   --  BILITOT 0.4  --   --    RADIOLOGY:  Dg Chest 2 View  Result Date: 10/10/2015 CLINICAL DATA:  Shortness of breath. EXAM: CHEST  2 VIEW COMPARISON:  None. FINDINGS: The heart size and mediastinal contours are within normal limits. Both lungs are clear. No pneumothorax or pleural effusion is noted. Atherosclerosis of thoracic aorta is noted. The visualized skeletal structures are unremarkable. IMPRESSION: No active cardiopulmonary disease.  Aortic atherosclerosis.  Electronically Signed   By: Marijo Conception, M.D.   On: 10/10/2015 19:44   ASSESSMENT AND PLAN:  This is a 82 y.o. female with a history of  COPD, hypertension, hyperlipidemia, rectal bleeding, diverticulitis, GERD.  1. Healthcare associated pneumonia Continue IV antibiotics including cefepime and vancomycin, f/u CBC, blood and sputum cultures, continue nebulizers, O2 therapy, expectorants.  2. Diarrhea, resolved, no BM after admission, get stool studies including C. Difficile if possible.  3. Hyponatremia, continue IV normal saline 4. Hypokalemia, improved with KCl. 5. Anemia of chronic disease, Hb dow to 6.5, no active bleeding, FOBT given recent GI bleeding. Iron deficiency. PRBC transfusion, start Feosol, hematology consult.  6. History of COPD-continue nebulizers 7. History of hypertension-continue chlorthalidone and losartan 8. History of anxiety depression-continue Paxil 8. History of glaucoma-continue Restasis, Ocuvite and Timoptic  Weakness. PT consult. All the records are reviewed and case discussed with Care Management/Social Worker. Management plans discussed with the patient, family and they are in agreement.  CODE STATUS: Full code  TOTAL TIME TAKING CARE OF THIS PATIENT: 43 minutes.   More than 50% of the time was spent in counseling/coordination of care: YES  POSSIBLE D/C IN 3 DAYS, DEPENDING ON CLINICAL CONDITION.   Demetrios Loll M.D on 10/11/2015 at 2:40 PM  Between 7am to 6pm - Pager - [phone]  After 6pm go to [email or web address] - Proofreader  [facility] [place] [facility]  Office  [phone]  CC: Primary care physician; Gearldine Shown, DO  Note: This dictation was prepared with Dragon dictation along with smaller phrase technology. Any transcriptional errors that result from this process are unintentional.

## 2015-10-11 NOTE — Progress Notes (Signed)
Patient complaining of constant cough. MD notified. Orders received.

## 2015-10-11 NOTE — Care Management Note (Signed)
Case Management Note  Patient Details  Name: [name] [name] MRN: [id number] Date of Birth: 1925-09-26  Subjective/Objective:                  Met with patient to discuss discharge planning. She lives at independent living at [facility] and absolutely refused going to their rehab building; "they almost killed me" she said. She has her left arm in a cast and states she does not have a platform walker at home. Her PCP is with [facility] in [place].   Action/Plan: List of home health agencies left with patient. RNCM will continue to follow.   Expected Discharge Date:  10/12/15               Expected Discharge Plan:     In-House Referral:     Discharge planning Services  CM Consult  Post Acute Care Choice:  Durable Medical Equipment, Home Health Choice offered to:  Patient  DME Arranged:    DME Agency:     HH Arranged:    [place] Agency:     Status of Service:  In process, will continue to follow  If discussed at Long Length of Stay Meetings, dates discussed:    Additional Comments:  Marshell Garfinkel, RN 10/11/2015, 11:29 AM

## 2015-10-11 NOTE — Progress Notes (Signed)
Clinical Education officer, museum (CSW) received SNF consult. PT is pending. Per RN case manager's note patient refused SNF. CSW will continue to follow and assist as needed.   [employer], LCSW [phone]

## 2015-10-12 LAB — URINALYSIS COMPLETE WITH MICROSCOPIC (ARMC ONLY)
BILIRUBIN URINE: NEGATIVE
Glucose, UA: NEGATIVE mg/dL
HGB URINE DIPSTICK: NEGATIVE
Ketones, ur: NEGATIVE mg/dL
LEUKOCYTES UA: NEGATIVE
NITRITE: NEGATIVE
PH: 7 (ref 5.0–8.0)
Protein, ur: NEGATIVE mg/dL
SPECIFIC GRAVITY, URINE: 1.009 (ref 1.005–1.030)

## 2015-10-12 LAB — GASTROINTESTINAL PANEL BY PCR, STOOL (REPLACES STOOL CULTURE)
ADENOVIRUS F40/41: NOT DETECTED
Astrovirus: NOT DETECTED
CRYPTOSPORIDIUM: NOT DETECTED
CYCLOSPORA CAYETANENSIS: NOT DETECTED
Campylobacter species: NOT DETECTED
ENTAMOEBA HISTOLYTICA: NOT DETECTED
ENTEROAGGREGATIVE E COLI (EAEC): NOT DETECTED
Enteropathogenic E coli (EPEC): NOT DETECTED
Enterotoxigenic E coli (ETEC): NOT DETECTED
GIARDIA LAMBLIA: NOT DETECTED
Norovirus GI/GII: DETECTED — AB
Plesimonas shigelloides: NOT DETECTED
ROTAVIRUS A: NOT DETECTED
SALMONELLA SPECIES: NOT DETECTED
SHIGA LIKE TOXIN PRODUCING E COLI (STEC): NOT DETECTED
SHIGELLA/ENTEROINVASIVE E COLI (EIEC): NOT DETECTED
Sapovirus (I, II, IV, and V): NOT DETECTED
VIBRIO SPECIES: NOT DETECTED
Vibrio cholerae: NOT DETECTED
YERSINIA ENTEROCOLITICA: NOT DETECTED

## 2015-10-12 LAB — TYPE AND SCREEN
ABO/RH(D): O NEG
ANTIBODY SCREEN: NEGATIVE
Unit division: 0

## 2015-10-12 LAB — BASIC METABOLIC PANEL
ANION GAP: 6 (ref 5–15)
BUN: 12 mg/dL (ref 6–20)
CHLORIDE: 101 mmol/L (ref 101–111)
CO2: 26 mmol/L (ref 22–32)
CREATININE: 0.68 mg/dL (ref 0.44–1.00)
Calcium: 8.6 mg/dL — ABNORMAL LOW (ref 8.9–10.3)
GFR calc non Af Amer: >60 mL/min (ref >60)
Glucose, Bld: 104 mg/dL — ABNORMAL HIGH (ref 65–99)
POTASSIUM: 3.6 mmol/L (ref 3.5–5.1)
Sodium: 133 mmol/L — ABNORMAL LOW (ref 135–145)

## 2015-10-12 LAB — CBC
HEMATOCRIT: 24.3 % — AB (ref 35.0–47.0)
HEMOGLOBIN: 7.9 g/dL — AB (ref 12.0–16.0)
MCH: 23 pg — AB (ref 26.0–34.0)
MCHC: 32.3 g/dL (ref 32.0–36.0)
MCV: 71.3 fL — AB (ref 80.0–100.0)
Platelets: 596 10*3/uL — ABNORMAL HIGH (ref 150–440)
RBC: 3.41 MIL/uL — AB (ref 3.80–5.20)
RDW: 22.2 % — ABNORMAL HIGH (ref 11.5–14.5)
WBC: 13.9 10*3/uL — AB (ref 3.6–11.0)

## 2015-10-12 LAB — WBCS, STOOL

## 2015-10-12 LAB — OCCULT BLOOD X 1 CARD TO LAB, STOOL: Fecal Occult Bld: POSITIVE — AB

## 2015-10-12 MED ORDER — GUAIFENESIN 100 MG/5ML PO SOLN
5.0000 mL | ORAL | Status: DC | PRN
  Filled 2015-10-12: qty 5

## 2015-10-12 NOTE — Evaluation (Signed)
Physical Therapy Evaluation Patient Details Name: [name] [name] MRN: [id number] DOB: 06/26/1925 Today's Date: 10/12/2015   History of Present Illness  82 y/o female admitted with HCAP, has needed transfusions with Hgb only 7.9 at time of PT eval.  Recent L wrist fx.  Clinical Impression  Pt with low blood levels but overall able to participate well with PT.  She shows good relative confidence with ambulation using cane and is able to ambulate ~75 ft with only minimal fatigue - no LOBs but pt reports being less steady than her normal and just not feeling as confident.  Pt did well with mobility and transition to/from standing.  She is normally independent with errands, shopping, etc out of the home but seems to feel that her family will be able to assist for a while if needed.  Pt should be able to safely return to A/ILF with HHPT.    Follow Up Recommendations Home health PT    Equipment Recommendations       Recommendations for Other Services       Precautions / Restrictions Precautions Precautions: Fall Restrictions LUE Weight Bearing: Non weight bearing      Mobility  Bed Mobility Overal bed mobility: Independent             General bed mobility comments: Pt with no issues getting from supine to sit  Transfers Overall transfer level: Independent Equipment used: Straight cane             General transfer comment: Pt rises to standing with good confidence, no LOBs.  Did have some lightheadedness and needs a moment to acclimate.  Ambulation/Gait Ambulation/Gait assistance: Supervision Ambulation Distance (Feet): 75 Feet Assistive device: Straight cane       General Gait Details: Pt did well with ambulation and showed consistent speed, good confidence and had no LOBs.  She reports her walking speed and stability has decreased significantly over the last year but with her cane she feel confident.  Overall pt did not have excessive fatigue, but was not as  steady as her baseline.   Stairs            Wheelchair Mobility    Modified Rankin (Stroke Patients Only)       Balance Overall balance assessment: Modified Independent                                           Pertinent Vitals/Pain Pain Assessment:  (reports only minimal pain, L wrist, general ache)    Home Living Family/patient expects to be discharged to:: Assisted living               Home Equipment: [name] - single point      Prior Function Level of Independence: Independent with assistive device(s)         Comments: Pt still driving, running errands, generally indpendent and active - has had a few falls in the last 6 months     Hand Dominance        Extremity/Trunk Assessment   Upper Extremity Assessment:  (R UE WFL, L only limited with cast)           Lower Extremity Assessment: Overall WFL for tasks assessed (age appropriate limitations, some RA foot deformity)         Communication   Communication: No difficulties;HOH  Cognition Arousal/Alertness: Awake/alert Behavior During Therapy: [facility]  for tasks assessed/performed Overall Cognitive Status: Within Functional Limits for tasks assessed                      General Comments      Exercises     Assessment/Plan    PT Assessment Patient needs continued PT services  PT Problem List Decreased strength;Decreased activity tolerance;Decreased balance          PT Treatment Interventions Gait training;Functional mobility training;Therapeutic activities;Therapeutic exercise;Balance training    PT Goals (Current goals can be found in the Care Plan section)  Acute Rehab PT Goals Patient Stated Goal: get stronger PT Goal Formulation: With patient Time For Goal Achievement: 10/26/15 Potential to Achieve Goals: Good    Frequency Min 2X/week   Barriers to discharge        Co-evaluation               End of Session Equipment Utilized During  Treatment: Gait belt Activity Tolerance: Patient tolerated treatment well;Patient limited by fatigue Patient left: with call bell/phone within reach;with chair alarm set           Time: 1139-1202 PT Time Calculation (min) (ACUTE ONLY): 23 min   Charges:   PT Evaluation $PT Eval Low Complexity: 1 Procedure     PT G CodesKreg Shropshire, DPT 10/12/2015, 12:18 PM

## 2015-10-12 NOTE — Progress Notes (Signed)
Patient hemoglobin 7.4 this morning. MD Pyreddy notified. Acknowledged. No orders received.

## 2015-10-12 NOTE — Progress Notes (Signed)
MD notified about abnormal lab result  Patient plced on contact precaution, education materiel given to patient about norovirus.

## 2015-10-12 NOTE — Progress Notes (Signed)
Patient remains alert and oriented, dines any pain, ambulate with PT, patient sitting in the chair at ths time, IV fluid D/C as per order, cast intact left wrist capillary refil WNR .

## 2015-10-12 NOTE — Progress Notes (Signed)
[place] at [place] NAME: [name] [name]    MR#:  [id number]  DATE OF BIRTH:  80/17/1927  SUBJECTIVE:  CHIEF COMPLAINT:   Chief Complaint  Patient presents with  . Shortness of Breath   No SOB, still has cough and weakness. REVIEW OF SYSTEMS:  Review of Systems  Constitutional: Negative for chills and fever.  HENT: Negative for sore throat.   Eyes: Negative for blurred vision and double vision.  Respiratory: Positive for cough. Negative for hemoptysis, sputum production, shortness of breath, wheezing and stridor.   Cardiovascular: Negative for chest pain, palpitations and leg swelling.  Gastrointestinal: Negative for abdominal pain, blood in stool, melena, nausea and vomiting.  Genitourinary: Negative for dysuria, hematuria and urgency.  Musculoskeletal: Negative for joint pain.  Skin: Negative for itching and rash.  Neurological: Negative for dizziness, seizures, loss of consciousness and headaches.  Psychiatric/Behavioral: Negative for depression. The patient is not nervous/anxious.     DRUG ALLERGIES:   Allergies  Allergen Reactions  . Codeine Nausea And Vomiting  . Dextrans Hives  . Fentanyl Itching  . Lipitor [Atorvastatin] Other (See Comments)    Reaction: Muscle pain  . Mobic [Meloxicam] Other (See Comments)    Reaction: Mouth and tongue ulcers  . Niacin And Related Itching  . Nsaids Hives  . Pravachol [Pravastatin Sodium] Other (See Comments)    Reaction: Muscle pain  . Voltaren [Diclofenac Sodium] Other (See Comments)    Reaction: Mouth and tongue ulcers  . Zetia [Ezetimibe] Other (See Comments)    Reaction: Leg pain   VITALS:  Blood pressure (!) 170/67, pulse 95, temperature 99 F (37.2 C), temperature source Oral, resp. rate 18, height 5\' 9"  (1.753 m), weight 183 lb 9.6 oz (83.3 kg), SpO2 97 %. PHYSICAL EXAMINATION:  Physical Exam  Constitutional: She is oriented to person, place, and time and  well-developed, well-nourished, and in no distress. No distress.  HENT:  Head: Normocephalic.  Mouth/Throat: Oropharynx is clear and moist.  Eyes: Conjunctivae and EOM are normal.  Neck: Normal range of motion. Neck supple. No JVD present.  Cardiovascular: Normal rate, regular rhythm and normal heart sounds.  Exam reveals no gallop.   No murmur heard. Pulmonary/Chest: Effort normal and breath sounds normal. No respiratory distress. She has no wheezes. She has no rales.  Abdominal: Soft. Bowel sounds are normal. She exhibits no distension. There is no tenderness.  Musculoskeletal: Normal range of motion. She exhibits no edema or tenderness.  Lymphadenopathy:    She has no cervical adenopathy.  Neurological: She is alert and oriented to person, place, and time. No cranial nerve deficit.  Skin: Skin is warm.  Psychiatric: Mood, memory, affect and judgment normal.   LABORATORY PANEL:   CBC  Recent Labs Lab 10/12/15 0420  WBC 13.9*  HGB 7.9*  HCT 24.3*  PLT 596*   ------------------------------------------------------------------------------------------------------------------ Chemistries   Recent Labs Lab 10/10/15 2002  10/11/15 0754 10/12/15 0420  NA 131*  < >  --  133*  K 3.2*  < >  --  3.6  CL 93*  < >  --  101  CO2 31  < >  --  26  GLUCOSE 117*  < >  --  104*  BUN 17  < >  --  12  CREATININE 0.79  < >  --  0.68  CALCIUM 9.1  < >  --  8.6*  MG 2.0  --  2.0  --  AST 25  --   --   --   ALT 31  --   --   --   ALKPHOS 69  --   --   --   BILITOT 0.4  --   --   --   < > = values in this interval not displayed. RADIOLOGY:  No results found. ASSESSMENT AND PLAN:  This is a 82 y.o. female with a history of  COPD, hypertension, hyperlipidemia, rectal bleeding, diverticulitis, GERD.  1. Healthcare associated pneumonia Continue IV antibiotics including cefepime and discontinue vancomycin, f/u CBC, blood and sputum cultures, continue nebulizers,  Expectorants. off O2  therapy,  2. Diarrhea, resolved, no BM after admission, get stool studies including C. Difficile if possible.  3. Hyponatremia, better, discontinue IV normal saline 4. Hypokalemia, improved with KCl. 5. Anemia of chronic disease, Hb down to 6.5, no active bleeding, FOBT given recent GI bleeding. Iron deficiency. S/p PRBC transfusion, unable to tolerate po Feosol, f/u Dr. Grayland Ormond, hematology consult, for IV iron infusion.  6. History of COPD-continue nebulizers 7. History of hypertension-continue chlorthalidone and losartan 8. History of anxiety depression-continue Paxil 8. History of glaucoma-continue Restasis, Ocuvite and Timoptic  Weakness. PT consult: HHPT. All the records are reviewed and case discussed with Care Management/Social Worker. Management plans discussed with the patient, family and they are in agreement.  CODE STATUS: Full code  TOTAL TIME TAKING CARE OF THIS PATIENT: 33 minutes.   More than 50% of the time was spent in counseling/coordination of care: YES  POSSIBLE D/C IN 1-2 DAYS, DEPENDING ON CLINICAL CONDITION.   Demetrios Loll M.D on 10/12/2015 at 3:08 PM  Between 7am to 6pm - Pager - [phone]  After 6pm go to [email or web address] - Proofreader  [facility]  Office  [phone]  CC: Primary care physician; Gearldine Shown, DO  Note: This dictation was prepared with Dragon dictation along with smaller phrase technology. Any transcriptional errors that result from this process are unintentional.

## 2015-10-12 NOTE — Care Management Important Message (Signed)
Important Message  Patient Details  Name: [name] [name] MRN: [id number] Date of Birth: [birth year]   Medicare Important Message Given:  Yes    Marshell Garfinkel, RN 10/12/2015, 8:04 AM

## 2015-10-12 NOTE — Care Management (Signed)
Spoke with patient's daughter by phone as patient requested to determine home health agency. She states Shanon Brow (son in law) will need to decide this with patient and will decide on answer this evening. Patient would benefit from PT, OT, NA, and Education officer, museum.

## 2015-10-13 LAB — BASIC METABOLIC PANEL
ANION GAP: 6 (ref 5–15)
BUN: 15 mg/dL (ref 6–20)
CO2: 27 mmol/L (ref 22–32)
Calcium: 8.8 mg/dL — ABNORMAL LOW (ref 8.9–10.3)
Chloride: 99 mmol/L — ABNORMAL LOW (ref 101–111)
Creatinine, Ser: 0.67 mg/dL (ref 0.44–1.00)
GLUCOSE: 95 mg/dL (ref 65–99)
POTASSIUM: 3.8 mmol/L (ref 3.5–5.1)
Sodium: 132 mmol/L — ABNORMAL LOW (ref 135–145)

## 2015-10-13 LAB — VANCOMYCIN, TROUGH: Vancomycin Tr: 4 ug/mL — ABNORMAL LOW (ref 15–20)

## 2015-10-13 LAB — CBC
HEMATOCRIT: 24.9 % — AB (ref 35.0–47.0)
Hemoglobin: 7.9 g/dL — ABNORMAL LOW (ref 12.0–16.0)
MCH: 23 pg — ABNORMAL LOW (ref 26.0–34.0)
MCHC: 31.9 g/dL — ABNORMAL LOW (ref 32.0–36.0)
MCV: 72.1 fL — AB (ref 80.0–100.0)
PLATELETS: 610 10*3/uL — AB (ref 150–440)
RBC: 3.45 MIL/uL — AB (ref 3.80–5.20)
RDW: 22.2 % — ABNORMAL HIGH (ref 11.5–14.5)
WBC: 12.2 10*3/uL — AB (ref 3.6–11.0)

## 2015-10-13 MED ORDER — CEFUROXIME AXETIL 500 MG PO TABS: 500.0000 mg | ORAL_TABLET | Freq: Two times a day (BID) | ORAL | 0 refills | Status: DC

## 2015-10-13 MED ORDER — HYDROCODONE-ACETAMINOPHEN 5-325 MG PO TABS: 1.0000 | ORAL_TABLET | ORAL | 0 refills | Status: DC | PRN

## 2015-10-13 MED ORDER — IRON SUCROSE 20 MG/ML IV SOLN
400.0000 mg | Freq: Once | INTRAVENOUS | Status: AC
  Administered 2015-10-13: 400 mg via INTRAVENOUS
  Filled 2015-10-13: qty 20

## 2015-10-13 MED ORDER — HYDROCORTISONE 2.5 % RE CREA
TOPICAL_CREAM | Freq: Two times a day (BID) | RECTAL | Status: DC
  Filled 2015-10-13: qty 28.35

## 2015-10-13 MED ORDER — AZITHROMYCIN 250 MG PO TABS: ORAL_TABLET | ORAL | 0 refills | Status: DC

## 2015-10-13 MED ORDER — HYDROCORTISONE 2.5 % RE CREA: TOPICAL_CREAM | Freq: Two times a day (BID) | RECTAL | 0 refills | Status: DC

## 2015-10-13 MED ORDER — VALACYCLOVIR HCL 1 G PO TABS
1000.0000 mg | ORAL_TABLET | Freq: Two times a day (BID) | ORAL | 0 refills | Status: AC
Start: 2015-10-13 — End: 2015-10-14

## 2015-10-13 NOTE — Discharge Summary (Signed)
[facility] at [place] NAME: [name] [name]    MR#:  [id number]  DATE OF BIRTH:  [birth year]  DATE OF ADMISSION:  10/10/2015 ADMITTING PHYSICIAN: Harvie Bridge, DO  DATE OF DISCHARGE: 10/13/2015  PRIMARY CARE PHYSICIAN: Arrie Aran PATRICIA, DO    ADMISSION DIAGNOSIS:  Tachypnea [R06.82] HCAP (healthcare-associated pneumonia) [J18.9]  DISCHARGE DIAGNOSIS:  Bronchitis  SECONDARY DIAGNOSIS:   Past Medical History:  Diagnosis Date  . COPD (chronic obstructive pulmonary disease) ([place])   . Diverticulitis   . GERD (gastroesophageal reflux disease)   . Glaucoma   . HLD (hyperlipidemia)   . Hypertension   . Rectal bleeding     HOSPITAL COURSE:   1.  Suspected pneumonia. Chest x-ray read by radiologist was negative. Treat for bronchitis. Patient did have cough and leukocytosis. Patient was started on aggressive antibiotics with vancomycin, cefepime and Zithromax initially. Patient felt better and wanted to go home. Continue Zithromax and Ceftin upon discharge home 2. Infectious diarrhea with neurovirus 3. Hyponatremia. Only a few points lower than normal range. 4. Iron deficiency anemia. Patient was given a unit of blood while here. Hemoglobin upon discharge 7.9. I did give IV Venofer 400 mg IV 1. Patient refused oral iron. Refer to [facility] for iron infusions as outpatient. 5. Recent endoscopy showing gastritis. On Carafate and AcipHex 6. Cold sore. 2 doses of Valtrex ordered 7. Bleeding hemorrhoid and Anusol cream prescribed 8. Essential hypertension usual medications continued 9. Weakness. Physical therapy recommended home health. Patient refused to go to skilled nursing. She did well enough with physical therapy to go to independent living with home health.  DISCHARGE CONDITIONS:   Satisfactory  CONSULTS OBTAINED:  Treatment Team:  Lloyd Huger, MD  DRUG ALLERGIES:   Allergies  Allergen Reactions  .  Codeine Nausea And Vomiting  . Dextrans Hives  . Fentanyl Itching  . Lipitor [Atorvastatin] Other (See Comments)    Reaction: Muscle pain  . Mobic [Meloxicam] Other (See Comments)    Reaction: Mouth and tongue ulcers  . Niacin And Related Itching  . Nsaids Hives  . Pravachol [Pravastatin Sodium] Other (See Comments)    Reaction: Muscle pain  . Voltaren [Diclofenac Sodium] Other (See Comments)    Reaction: Mouth and tongue ulcers  . Zetia [Ezetimibe] Other (See Comments)    Reaction: Leg pain    DISCHARGE MEDICATIONS:   Current Discharge Medication List    START taking these medications   Details  azithromycin (ZITHROMAX) 250 MG tablet One tab daily for three days Qty: 3 each, Refills: 0    cefUROXime (CEFTIN) 500 MG tablet Take 1 tablet (500 mg total) by mouth 2 (two) times daily with a meal. Qty: 12 tablet, Refills: 0    hydrocortisone (ANUSOL-HC) 2.5 % rectal cream Place rectally 2 (two) times daily. Qty: 30 g, Refills: 0      CONTINUE these medications which have CHANGED   Details  HYDROcodone-acetaminophen (NORCO/VICODIN) 5-325 MG tablet Take 1 tablet by mouth every 4 (four) hours as needed for moderate pain. Qty: 12 tablet, Refills: 0    valACYclovir (VALTREX) 1000 MG tablet Take 1 tablet (1,000 mg total) by mouth 2 (two) times daily. Qty: 2 tablet, Refills: 0      CONTINUE these medications which have NOT CHANGED   Details  acetaminophen (TYLENOL) 650 MG CR tablet Take 650 mg by mouth every 8 (eight) hours as needed for pain.    albuterol (PROVENTIL HFA;VENTOLIN HFA) 108 (90 Base)  MCG/ACT inhaler Inhale 2 puffs into the lungs every 4 (four) hours as needed for wheezing or shortness of breath.    chlorthalidone (HYGROTON) 25 MG tablet Take 12.5 mg by mouth daily.    Coenzyme Q10 (GNP CO Q10) 100 MG capsule Take 200 mg by mouth daily.    Cyanocobalamin (B-12) 1000 MCG CAPS Take 2,000 Units by mouth 2 (two) times daily.    cycloSPORINE (RESTASIS) 0.05 %  ophthalmic emulsion Place 1 drop into both eyes 2 (two) times daily.    gabapentin (NEURONTIN) 300 MG capsule Take 300 mg by mouth 2 (two) times daily.    guaiFENesin (MUCINEX) 600 MG 12 hr tablet Take 600 mg by mouth 2 (two) times daily as needed.    loperamide (IMODIUM) 2 MG capsule Take 2 mg by mouth as needed for diarrhea or loose stools.    loratadine (CLARITIN) 10 MG tablet Take 10 mg by mouth daily as needed for allergies.    losartan (COZAAR) 50 MG tablet Take 50 mg by mouth daily.    Multiple Vitamins-Minerals (OCUVITE PRESERVISION PO) Take 1 tablet by mouth 2 (two) times daily.    PARoxetine (PAXIL) 40 MG tablet Take 40 mg by mouth every morning.    Polyethyl Glycol-Propyl Glycol (SYSTANE) 0.4-0.3 % SOLN Apply 1 drop to eye as needed.    potassium chloride (K-DUR,KLOR-CON) 10 MEQ tablet Take 10 mEq by mouth 2 (two) times daily.    RABEprazole (ACIPHEX) 20 MG tablet Take 20 mg by mouth 2 (two) times daily.    sucralfate (CARAFATE) 1 g tablet Take 1 tablet (1 g total) by mouth 4 (four) times daily -  with meals and at bedtime. Qty: 120 tablet, Refills: 1    timolol (TIMOPTIC-XR) 0.5 % ophthalmic gel-forming Place 1 drop into both eyes daily.      STOP taking these medications     acetaminophen (TYLENOL) 500 MG tablet      amoxicillin-clavulanate (AUGMENTIN) 875-125 MG tablet      fluticasone (FLONASE) 50 MCG/ACT nasal spray      Krill Oil 500 MG CAPS      mometasone (ELOCON) 0.1 % lotion      nystatin (NYSTATIN) powder      omeprazole (PRILOSEC) 40 MG capsule          DISCHARGE INSTRUCTIONS:   Follow-up PMD one week  If you experience worsening of your admission symptoms, develop shortness of breath, life threatening emergency, suicidal or homicidal thoughts you must seek medical attention immediately by calling 911 or calling your MD immediately  if symptoms less severe.  You Must read complete instructions/literature along with all the possible adverse  reactions/side effects for all the Medicines you take and that have been prescribed to you. Take any new Medicines after you have completely understood and accept all the possible adverse reactions/side effects.   Please note  You were cared for by a hospitalist during your hospital stay. If you have any questions about your discharge medications or the care you received while you were in the hospital after you are discharged, you can call the unit and asked to speak with the hospitalist on call if the hospitalist that took care of you is not available. Once you are discharged, your primary care physician will handle any further medical issues. Please note that NO REFILLS for any discharge medications will be authorized once you are discharged, as it is imperative that you return to your primary care physician (or establish a relationship with  a primary care physician if you do not have one) for your aftercare needs so that they can reassess your need for medications and monitor your lab values.    Today   CHIEF COMPLAINT:   Chief Complaint  Patient presents with  . Shortness of Breath    HISTORY OF PRESENT ILLNESS:  [name] [name]  is a 82 y.o. female presented with shortness of breath and cough   VITAL SIGNS:  Blood pressure 133/88, pulse 94, temperature 98.6 F (37 C), temperature source Oral, resp. rate 20, height 5\' 9"  (1.753 m), weight 83.3 kg (183 lb 9.6 oz), SpO2 100 %.    PHYSICAL EXAMINATION:  GENERAL:  82 y.o.-year-old patient lying in the bed with no acute distress.  EYES: Pupils equal, round, reactive to light and accommodation. No scleral icterus. Extraocular muscles intact.  HEENT: Head atraumatic, normocephalic. Oropharynx and nasopharynx clear.  NECK:  Supple, no jugular venous distention. No thyroid enlargement, no tenderness.  LUNGS: Decreased breath sounds bilaterally bases, no wheezing, rales,rhonchi or crepitation. No use of accessory muscles of respiration.   CARDIOVASCULAR: S1, S2 normal. 2/6 systolic murmurs, no rubs, or gallops.  ABDOMEN: Soft, non-tender, non-distended. Bowel sounds present. No organomegaly or mass.  EXTREMITIES: Trace edema, no cyanosis, or clubbing.  NEUROLOGIC: Cranial nerves II through XII are intact. Muscle strength 5/5 in all extremities. Sensation intact. Gait not checked.  PSYCHIATRIC: The patient is alert and oriented x 3.  SKIN: No obvious rash, lesion, or ulcer. Left arm in cast  DATA REVIEW:   CBC  Recent Labs Lab 10/13/15 0441  WBC 12.2*  HGB 7.9*  HCT 24.9*  PLT 610*    Chemistries   Recent Labs Lab 10/10/15 2002  10/11/15 0754  10/13/15 0441  NA 131*  < >  --   < > 132*  K 3.2*  < >  --   < > 3.8  CL 93*  < >  --   < > 99*  CO2 31  < >  --   < > 27  GLUCOSE 117*  < >  --   < > 95  BUN 17  < >  --   < > 15  CREATININE 0.79  < >  --   < > 0.67  CALCIUM 9.1  < >  --   < > 8.8*  MG 2.0  --  2.0  --   --   AST 25  --   --   --   --   ALT 31  --   --   --   --   ALKPHOS 69  --   --   --   --   BILITOT 0.4  --   --   --   --   < > = values in this interval not displayed.  Cardiac Enzymes  Recent Labs Lab 10/10/15 2002  TROPONINI <0.03    Microbiology Results  Results for orders placed or performed during the hospital encounter of 10/10/15  Blood culture (routine x 2)     Status: None (Preliminary result)   Collection Time: 10/10/15  7:45 PM  Result Value Ref Range Status   Specimen Description BLOOD RIGHT ASSIST CONTROL  Final   Special Requests BOTTLES DRAWN AEROBIC AND ANAEROBIC [place]  Final   Culture NO GROWTH 3 DAYS  Final   Report Status PENDING  Incomplete  Blood culture (routine x 2)     Status: None (Preliminary result)   Collection  Time: 10/10/15  7:50 PM  Result Value Ref Range Status   Specimen Description BLOOD RIGHT HAND  Final   Special Requests BOTTLES DRAWN AEROBIC AND ANAEROBIC 11CC  Final   Culture NO GROWTH 3 DAYS  Final   Report Status PENDING  Incomplete   MRSA PCR Screening     Status: None   Collection Time: 10/11/15  6:19 PM  Result Value Ref Range Status   MRSA by PCR NEGATIVE NEGATIVE Final    Comment:        The GeneXpert MRSA Assay (FDA approved for NASAL specimens only), is one component of a comprehensive MRSA colonization surveillance program. It is not intended to diagnose MRSA infection nor to guide or monitor treatment for MRSA infections.   C difficile quick scan w PCR reflex     Status: None   Collection Time: 10/11/15 10:00 PM  Result Value Ref Range Status   C Diff antigen NEGATIVE NEGATIVE Final   C Diff toxin NEGATIVE NEGATIVE Final   C Diff interpretation No C. difficile detected.  Final    Comment: Negative for C. difficile  Gastrointestinal Panel by PCR , Stool     Status: Abnormal   Collection Time: 10/12/15  2:07 PM  Result Value Ref Range Status   Campylobacter species NOT DETECTED NOT DETECTED Final   Plesimonas shigelloides NOT DETECTED NOT DETECTED Final   Salmonella species NOT DETECTED NOT DETECTED Final   Yersinia enterocolitica NOT DETECTED NOT DETECTED Final   Vibrio species NOT DETECTED NOT DETECTED Final   Vibrio cholerae NOT DETECTED NOT DETECTED Final   Enteroaggregative E coli (EAEC) NOT DETECTED NOT DETECTED Final   Enteropathogenic E coli (EPEC) NOT DETECTED NOT DETECTED Final   Enterotoxigenic E coli (ETEC) NOT DETECTED NOT DETECTED Final   Shiga like toxin producing E coli (STEC) NOT DETECTED NOT DETECTED Final   Shigella/Enteroinvasive E coli (EIEC) NOT DETECTED NOT DETECTED Final   Cryptosporidium NOT DETECTED NOT DETECTED Final   Cyclospora cayetanensis NOT DETECTED NOT DETECTED Final   Entamoeba histolytica NOT DETECTED NOT DETECTED Final   Giardia lamblia NOT DETECTED NOT DETECTED Final   Adenovirus F40/41 NOT DETECTED NOT DETECTED Final   Astrovirus NOT DETECTED NOT DETECTED Final   Norovirus GI/GII DETECTED (A) NOT DETECTED Final    Comment: RESULT CALLED TO, READ BACK BY AND  VERIFIED WITH: SIMONE POURE 10/12/15 1631 KLW    Rotavirus A NOT DETECTED NOT DETECTED Final   Sapovirus (I, II, IV, and V) NOT DETECTED NOT DETECTED Final    Management plans discussed with the patient, family and they are in agreement.  CODE STATUS:     Code Status Orders        Start     Ordered   10/10/15 2232  Full code  Continuous     10/10/15 2232    Code Status History    Date Active Date Inactive Code Status Order ID Comments User Context   08/11/2015  1:41 AM 08/13/2015  6:58 PM Full Code [id number]  Lance Coon, MD Inpatient    Advance Directive Documentation   Flowsheet Row Most Recent Value  Type of Advance Directive  Out of facility DNR (pink MOST or yellow form)  Pre-existing out of facility DNR order (yellow form or pink MOST form)  No data  "MOST" Form in Place?  No data      TOTAL TIME TAKING CARE OF THIS PATIENT: 35 minutes.    Loletha Grayer M.D on 10/13/2015 at  2:16 PM  Between 7am to 6pm - Pager - [phone]  After 6pm go to [email or web address] - Proofreader  [employer] [facility] Office  [phone]  CC: Primary care physician; Arrie Aran PATRICIA, DO

## 2015-10-13 NOTE — Care Management (Signed)
Patient discharging back to apartment at [facility] followed by [facility]. I have notified Corene Cornea with [facility] of patient discharge. No further RNCM needs. Case closed.

## 2015-10-13 NOTE — Discharge Instructions (Signed)

## 2015-10-13 NOTE — Care Management (Signed)
Received all from patient' son in law Shanon Brow requesting [facility] for home health after talking to patient. I have notified Corene Cornea with [facility]. She ambulated with cane and plans to have private duty at her home at [facility] also.

## 2015-10-15 LAB — CULTURE, BLOOD (ROUTINE X 2)
CULTURE: NO GROWTH
Culture: NO GROWTH

## 2015-10-16 LAB — STOOL CULTURE: E COLI SHIGA TOXIN ASSAY: NEGATIVE

## 2015-10-16 LAB — STOOL CULTURE REFLEX - RSASHR

## 2015-10-16 LAB — STOOL CULTURE REFLEX - CMPCXR

## 2015-10-17 NOTE — Progress Notes (Deleted)
[place]  [phone] [phone]  ID: [name] [name] OB: [birth year]  MR#: [id number]  [id number]  Patient Care Team: Gearldine Shown, DO as PCP - General (Family Medicine)  CHIEF COMPLAINT: Iron deficiency anemia due to chronic blood loss.  INTERVAL HISTORY: ***  REVIEW OF SYSTEMS:   ROS  As per HPI. Otherwise, a complete review of systems is negative.  PAST MEDICAL HISTORY: Past Medical History:  Diagnosis Date  . COPD (chronic obstructive pulmonary disease) ([place])   . Diverticulitis   . GERD (gastroesophageal reflux disease)   . Glaucoma   . HLD (hyperlipidemia)   . Hypertension   . Rectal bleeding     PAST SURGICAL HISTORY: Past Surgical History:  Procedure Laterality Date  . ABDOMINAL HYSTERECTOMY    . APPENDECTOMY    . ESOPHAGOGASTRODUODENOSCOPY (EGD) WITH PROPOFOL N/A 08/12/2015   Procedure: ESOPHAGOGASTRODUODENOSCOPY (EGD) WITH PROPOFOL;  Surgeon: Lollie Sails, MD;  Location: [facility] ENDOSCOPY;  Service: Endoscopy;  Laterality: N/A;  . JOINT REPLACEMENT      FAMILY HISTORY: Family History  Problem Relation Age of Onset  . CAD Mother   . CAD Father   . CAD Brother     ADVANCED DIRECTIVES (Y/N):  N  HEALTH MAINTENANCE: Social History  Substance Use Topics  . Smoking status: Former Research scientist (life sciences)  . Smokeless tobacco: Never Used  . Alcohol use No     Colonoscopy:  PAP:  Bone density:  Lipid panel:  Allergies  Allergen Reactions  . Codeine Nausea And Vomiting  . Dextrans Hives  . Fentanyl Itching  . Lipitor [Atorvastatin] Other (See Comments)    Reaction: Muscle pain  . Mobic [Meloxicam] Other (See Comments)    Reaction: Mouth and tongue ulcers  . Niacin And Related Itching  . Nsaids Hives  . Pravachol [Pravastatin Sodium] Other (See Comments)    Reaction: Muscle pain  . Voltaren [Diclofenac Sodium] Other (See Comments)    Reaction: Mouth and tongue ulcers  . Zetia [Ezetimibe] Other  (See Comments)    Reaction: Leg pain    Current Outpatient Prescriptions  Medication Sig Dispense Refill  . acetaminophen (TYLENOL) 650 MG CR tablet Take 650 mg by mouth every 8 (eight) hours as needed for pain.    Marland Kitchen albuterol (PROVENTIL HFA;VENTOLIN HFA) 108 (90 Base) MCG/ACT inhaler Inhale 2 puffs into the lungs every 4 (four) hours as needed for wheezing or shortness of breath.    Marland Kitchen azithromycin (ZITHROMAX) 250 MG tablet One tab daily for three days 3 each 0  . cefUROXime (CEFTIN) 500 MG tablet Take 1 tablet (500 mg total) by mouth 2 (two) times daily with a meal. 12 tablet 0  . chlorthalidone (HYGROTON) 25 MG tablet Take 12.5 mg by mouth daily.    . Coenzyme Q10 (GNP CO Q10) 100 MG capsule Take 200 mg by mouth daily.    . Cyanocobalamin (B-12) 1000 MCG CAPS Take 2,000 Units by mouth 2 (two) times daily.    . cycloSPORINE (RESTASIS) 0.05 % ophthalmic emulsion Place 1 drop into both eyes 2 (two) times daily.    Marland Kitchen gabapentin (NEURONTIN) 300 MG capsule Take 300 mg by mouth 2 (two) times daily.    Marland Kitchen guaiFENesin (MUCINEX) 600 MG 12 hr tablet Take 600 mg by mouth 2 (two) times daily as needed.    Marland Kitchen HYDROcodone-acetaminophen (NORCO/VICODIN) 5-325 MG tablet Take 1 tablet by mouth every 4 (four) hours as needed for moderate pain. 12 tablet 0  . hydrocortisone (ANUSOL-HC) 2.5 %  rectal cream Place rectally 2 (two) times daily. 30 g 0  . loperamide (IMODIUM) 2 MG capsule Take 2 mg by mouth as needed for diarrhea or loose stools.    Marland Kitchen loratadine (CLARITIN) 10 MG tablet Take 10 mg by mouth daily as needed for allergies.    Marland Kitchen losartan (COZAAR) 50 MG tablet Take 50 mg by mouth daily.    . Multiple Vitamins-Minerals (OCUVITE PRESERVISION PO) Take 1 tablet by mouth 2 (two) times daily.    Marland Kitchen PARoxetine (PAXIL) 40 MG tablet Take 40 mg by mouth every morning.    Vladimir Faster Glycol-Propyl Glycol (SYSTANE) 0.4-0.3 % SOLN Apply 1 drop to eye as needed.    . potassium chloride (K-DUR,KLOR-CON) 10 MEQ tablet Take  10 mEq by mouth 2 (two) times daily.    . RABEprazole (ACIPHEX) 20 MG tablet Take 20 mg by mouth 2 (two) times daily.    . sucralfate (CARAFATE) 1 g tablet Take 1 tablet (1 g total) by mouth 4 (four) times daily -  with meals and at bedtime. 120 tablet 1  . timolol (TIMOPTIC-XR) 0.5 % ophthalmic gel-forming Place 1 drop into both eyes daily.     No current facility-administered medications for this visit.     OBJECTIVE: There were no vitals filed for this visit.   There is no height or weight on file to calculate BMI.    ECOG FS:{CHL ONC [id number]  General: Well-developed, well-nourished, no acute distress. Eyes: Pink conjunctiva, anicteric sclera. HEENT: Normocephalic, moist mucous membranes, clear oropharnyx. Lungs: Clear to auscultation bilaterally. Heart: Regular rate and rhythm. No rubs, murmurs, or gallops. Abdomen: Soft, nontender, nondistended. No organomegaly noted, normoactive bowel sounds. Musculoskeletal: No edema, cyanosis, or clubbing. Neuro: Alert, answering all questions appropriately. Cranial nerves grossly intact. Skin: No rashes or petechiae noted. Psych: Normal affect. Lymphatics: No cervical, calvicular, axillary or inguinal LAD.   LAB RESULTS:  Lab Results  Component Value Date   NA 132 (L) 10/13/2015   K 3.8 10/13/2015   CL 99 (L) 10/13/2015   CO2 27 10/13/2015   GLUCOSE 95 10/13/2015   BUN 15 10/13/2015   CREATININE 0.67 10/13/2015   CALCIUM 8.8 (L) 10/13/2015   PROT 8.1 10/10/2015   ALBUMIN 3.7 10/10/2015   AST 25 10/10/2015   ALT 31 10/10/2015   ALKPHOS 69 10/10/2015   BILITOT 0.4 10/10/2015   GFRNONAA >60 10/13/2015   GFRAA >60 10/13/2015    Lab Results  Component Value Date   WBC 12.2 (H) 10/13/2015   NEUTROABS 14.5 (H) 10/10/2015   HGB 7.9 (L) 10/13/2015   HCT 24.9 (L) 10/13/2015   MCV 72.1 (L) 10/13/2015   PLT 610 (H) 10/13/2015     STUDIES: Dg Chest 2 View  Result Date: 10/10/2015 CLINICAL DATA:  Shortness of breath.  EXAM: CHEST  2 VIEW COMPARISON:  None. FINDINGS: The heart size and mediastinal contours are within normal limits. Both lungs are clear. No pneumothorax or pleural effusion is noted. Atherosclerosis of thoracic aorta is noted. The visualized skeletal structures are unremarkable. IMPRESSION: No active cardiopulmonary disease.  Aortic atherosclerosis. Electronically Signed   By: Marijo Conception, M.D.   On: 10/10/2015 19:44   Dg Wrist Complete Left  Result Date: 09/27/2015 CLINICAL DATA:  Injury. EXAM: LEFT WRIST - COMPLETE 3+ VIEW COMPARISON:  No recent prior . FINDINGS: Angulated fracture of the distal radius is noted. Probable extension into the joint space is present. Diffuse prominent degenerative change noted. Degenerative changes are particular severe about  the first carpometacarpal joint . Loose bodies noted. IMPRESSION: 1. Angulated fracture of the distal left radius. Probable extension to the joint space is present. 2.  Diffuse severe degenerative changes left wrist. Electronically Signed   By: Marcello Moores  Register   On: 09/27/2015 06:46    ASSESSMENT: Iron deficiency anemia due to chronic blood loss  PLAN:    1. Iron deficiency anemia due to chronic blood loss:  Patient expressed understanding and was in agreement with this plan. She also understands that She can call clinic at any time with any questions, concerns, or complaints.   No matching staging information was found for the patient.  Lloyd Huger, MD   10/17/2015 11:42 PM

## 2015-10-19 ENCOUNTER — Inpatient Hospital Stay: Payer: Medicare Other | Admitting: Oncology

## 2015-10-19 ENCOUNTER — Inpatient Hospital Stay: Payer: Medicare Other | Attending: Internal Medicine | Admitting: Internal Medicine

## 2015-10-19 ENCOUNTER — Other Ambulatory Visit: Payer: Self-pay | Admitting: *Deleted

## 2015-10-19 ENCOUNTER — Other Ambulatory Visit: Payer: Self-pay

## 2015-10-19 ENCOUNTER — Inpatient Hospital Stay: Payer: Medicare Other

## 2015-10-19 VITALS — BP 127/73 | HR 92 | Temp 98.4°F | Ht 69.0 in | Wt 182.0 lb

## 2015-10-19 DIAGNOSIS — Z79899 Other long term (current) drug therapy: Secondary | ICD-10-CM | POA: Insufficient documentation

## 2015-10-19 DIAGNOSIS — Z87891 Personal history of nicotine dependence: Secondary | ICD-10-CM | POA: Insufficient documentation

## 2015-10-19 DIAGNOSIS — D500 Iron deficiency anemia secondary to blood loss (chronic): Secondary | ICD-10-CM

## 2015-10-19 DIAGNOSIS — D509 Iron deficiency anemia, unspecified: Secondary | ICD-10-CM | POA: Diagnosis not present

## 2015-10-19 DIAGNOSIS — K5790 Diverticulosis of intestine, part unspecified, without perforation or abscess without bleeding: Secondary | ICD-10-CM | POA: Diagnosis not present

## 2015-10-19 DIAGNOSIS — J449 Chronic obstructive pulmonary disease, unspecified: Secondary | ICD-10-CM | POA: Diagnosis not present

## 2015-10-19 DIAGNOSIS — D508 Other iron deficiency anemias: Secondary | ICD-10-CM

## 2015-10-19 DIAGNOSIS — E785 Hyperlipidemia, unspecified: Secondary | ICD-10-CM | POA: Diagnosis not present

## 2015-10-19 DIAGNOSIS — I10 Essential (primary) hypertension: Secondary | ICD-10-CM | POA: Diagnosis not present

## 2015-10-19 DIAGNOSIS — Z8781 Personal history of (healed) traumatic fracture: Secondary | ICD-10-CM | POA: Insufficient documentation

## 2015-10-19 DIAGNOSIS — K219 Gastro-esophageal reflux disease without esophagitis: Secondary | ICD-10-CM | POA: Diagnosis not present

## 2015-10-19 DIAGNOSIS — D649 Anemia, unspecified: Secondary | ICD-10-CM

## 2015-10-19 DIAGNOSIS — I700 Atherosclerosis of aorta: Secondary | ICD-10-CM | POA: Diagnosis not present

## 2015-10-19 LAB — CBC WITH DIFFERENTIAL/PLATELET
Basophils Absolute: 0.2 10*3/uL — ABNORMAL HIGH (ref 0–0.1)
Basophils Relative: 2 %
EOS ABS: 0.2 10*3/uL (ref 0–0.7)
Eosinophils Relative: 3 %
HEMATOCRIT: 25 % — AB (ref 35.0–47.0)
HEMOGLOBIN: 8.2 g/dL — AB (ref 12.0–16.0)
LYMPHS ABS: 1.1 10*3/uL (ref 1.0–3.6)
LYMPHS PCT: 12 %
MCH: 23.6 pg — AB (ref 26.0–34.0)
MCHC: 32.7 g/dL (ref 32.0–36.0)
MCV: 72.3 fL — AB (ref 80.0–100.0)
Monocytes Absolute: 0.8 10*3/uL (ref 0.2–0.9)
Monocytes Relative: 9 %
NEUTROS ABS: 7 10*3/uL — AB (ref 1.4–6.5)
NEUTROS PCT: 74 %
Platelets: 641 10*3/uL — ABNORMAL HIGH (ref 150–440)
RBC: 3.45 MIL/uL — AB (ref 3.80–5.20)
RDW: 24.2 % — ABNORMAL HIGH (ref 11.5–14.5)
WBC: 9.4 10*3/uL (ref 3.6–11.0)

## 2015-10-19 NOTE — Progress Notes (Signed)
Patient here for referral. Patient complaints of extreme tiredness.

## 2015-10-19 NOTE — Assessment & Plan Note (Signed)
Chronic iron deficieny documented by low iron indices dating back to several years. She has had an EGD and ct scans at [facility] , shwoing diverticulosis. Cannot undergo colonoscopy. She tolerates oral iron poorly due to stomach cramps and diarrhea She has had iV iron infusions in [facility] and most recently at [facility] and has had no reported allergic reactions  She needed atleast 3 units of blood transfusion over the last couple months  Goal is to palliate her symptoms of fatigue and improve her QOL, by bringing her hgb up to at least 10 and maintain at that level.  Therefore we will proceed with Iv Venofer once a week for 4 doses , reassess her response after 4 doses and repeat if necessary.  She has been on oral B12 supplements, will check B 12 level at her next visit in 2 months.  I discussed the side effefcts and benefits of Venofer. She agrees to the plan

## 2015-10-19 NOTE — Progress Notes (Signed)
[place]  [phone] [phone]) [phone]  ID: [name] [name] OB: 1925-07-20  MR#: [id number]  [id number]  Patient Care Team: Gearldine Shown, DO as PCP - General (Family Medicine)  CHIEF COMPLAINT: Anemia    INTERVAL HISTORY:   REVIEW OF SYSTEMS:   ROS  As per HPI. Otherwise, a complete review of systems is negative.  PAST MEDICAL HISTORY: Past Medical History:  Diagnosis Date  . COPD (chronic obstructive pulmonary disease) ([place])   . Diverticulitis   . GERD (gastroesophageal reflux disease)   . Glaucoma   . HLD (hyperlipidemia)   . Hypertension   . Rectal bleeding     PAST SURGICAL HISTORY: Past Surgical History:  Procedure Laterality Date  . ABDOMINAL HYSTERECTOMY    . APPENDECTOMY    . ESOPHAGOGASTRODUODENOSCOPY (EGD) WITH PROPOFOL N/A 08/12/2015   Procedure: ESOPHAGOGASTRODUODENOSCOPY (EGD) WITH PROPOFOL;  Surgeon: Lollie Sails, MD;  Location: [facility] ENDOSCOPY;  Service: Endoscopy;  Laterality: N/A;  . JOINT REPLACEMENT      FAMILY HISTORY: Family History  Problem Relation Age of Onset  . CAD Mother   . CAD Father   . CAD Brother     ADVANCED DIRECTIVES (Y/N):  N  HEALTH MAINTENANCE: Social History  Substance Use Topics  . Smoking status: Former Research scientist (life sciences)  . Smokeless tobacco: Never Used  . Alcohol use No     Colonoscopy:  PAP:  Bone density:  Lipid panel:  Allergies  Allergen Reactions  . Codeine Nausea And Vomiting  . Dextrans Hives  . Fentanyl Itching  . Lipitor [Atorvastatin] Other (See Comments)    Reaction: Muscle pain  . Mobic [Meloxicam] Other (See Comments)    Reaction: Mouth and tongue ulcers  . Niacin And Related Itching  . Nsaids Hives  . Pravachol [Pravastatin Sodium] Other (See Comments)    Reaction: Muscle pain  . Voltaren [Diclofenac Sodium] Other (See Comments)    Reaction: Mouth and tongue ulcers  . Zetia [Ezetimibe] Other (See Comments)    Reaction: Leg pain     Current Outpatient Prescriptions  Medication Sig Dispense Refill  . acetaminophen (TYLENOL) 650 MG CR tablet Take 650 mg by mouth every 8 (eight) hours as needed for pain.    Marland Kitchen albuterol (PROVENTIL HFA;VENTOLIN HFA) 108 (90 Base) MCG/ACT inhaler Inhale 2 puffs into the lungs every 4 (four) hours as needed for wheezing or shortness of breath.    . Coenzyme Q10 (GNP CO Q10) 100 MG capsule Take 200 mg by mouth daily.    . colchicine 0.6 MG tablet Take by mouth.    . Cyanocobalamin (B-12) 1000 MCG CAPS Take 2,000 Units by mouth 2 (two) times daily.    . cycloSPORINE (RESTASIS) 0.05 % ophthalmic emulsion Place 1 drop into both eyes 2 (two) times daily.    . fluticasone (FLONASE) 50 MCG/ACT nasal spray USE TWO SPRAYS IN BOTH NOSTRILS ONCE DAILY    . gabapentin (NEURONTIN) 300 MG capsule Take 300 mg by mouth 2 (two) times daily.    Marland Kitchen guaiFENesin (MUCINEX) 600 MG 12 hr tablet Take 600 mg by mouth 2 (two) times daily as needed.    Marland Kitchen HYDROcodone-acetaminophen (NORCO/VICODIN) 5-325 MG tablet Take 1 tablet by mouth every 4 (four) hours as needed for moderate pain. 12 tablet 0  . hydrocortisone (PROCTOSOL HC) 2.5 % rectal cream PLACE RECTALLY TWICE A DAY    . loratadine (CLARITIN) 10 MG tablet Take 10 mg by mouth daily as needed for allergies.    Marland Kitchen  losartan (COZAAR) 50 MG tablet Take 50 mg by mouth daily.    . Multiple Vitamins-Minerals (OCUVITE PRESERVISION PO) Take 1 tablet by mouth 2 (two) times daily.    Marland Kitchen nystatin (MYCOSTATIN/NYSTOP) powder Apply topically.    Marland Kitchen PARoxetine (PAXIL) 40 MG tablet Take 40 mg by mouth every morning.    Vladimir Faster Glycol-Propyl Glycol (SYSTANE) 0.4-0.3 % SOLN Apply 1 drop to eye as needed.    . potassium chloride (K-DUR,KLOR-CON) 10 MEQ tablet Take 10 mEq by mouth 2 (two) times daily.    Marland Kitchen RA KRILL OIL 500 MG CAPS Take by mouth.    . RABEprazole (ACIPHEX) 20 MG tablet Take 20 mg by mouth 2 (two) times daily.    . sucralfate (CARAFATE) 1 g tablet Take 1 tablet (1 g  total) by mouth 4 (four) times daily -  with meals and at bedtime. 120 tablet 1  . timolol (TIMOPTIC-XR) 0.5 % ophthalmic gel-forming Place 1 drop into both eyes daily.    . valACYclovir (VALTREX) 1000 MG tablet once daily as needed. Reported on 03/22/2015    . Vitamin D, Ergocalciferol, (DRISDOL) 50000 units CAPS capsule Take by mouth.     No current facility-administered medications for this visit.     OBJECTIVE: Vitals:   10/19/15 1351  BP: 127/73  Pulse: 92  Temp: 98.4 F (36.9 C)     Body mass index is 26.88 kg/m.   2.01 meters squared  ECOG FS:2 - Symptomatic, <50% confined to bed  Physical Exam   LAB RESULTS:  Lab Results  Component Value Date   NA 132 (L) 10/13/2015   K 3.8 10/13/2015   CL 99 (L) 10/13/2015   CO2 27 10/13/2015   GLUCOSE 95 10/13/2015   BUN 15 10/13/2015   CREATININE 0.67 10/13/2015   CALCIUM 8.8 (L) 10/13/2015   PROT 8.1 10/10/2015   ALBUMIN 3.7 10/10/2015   AST 25 10/10/2015   ALT 31 10/10/2015   ALKPHOS 69 10/10/2015   BILITOT 0.4 10/10/2015   GFRNONAA >60 10/13/2015   GFRAA >60 10/13/2015    Lab Results  Component Value Date   WBC 9.4 10/19/2015   NEUTROABS 7.0 (H) 10/19/2015   HGB 8.2 (L) 10/19/2015   HCT 25.0 (L) 10/19/2015   MCV 72.3 (L) 10/19/2015   PLT 641 (H) 10/19/2015     STUDIES: Dg Chest 2 View  Result Date: 10/10/2015 CLINICAL DATA:  Shortness of breath. EXAM: CHEST  2 VIEW COMPARISON:  None. FINDINGS: The heart size and mediastinal contours are within normal limits. Both lungs are clear. No pneumothorax or pleural effusion is noted. Atherosclerosis of thoracic aorta is noted. The visualized skeletal structures are unremarkable. IMPRESSION: No active cardiopulmonary disease.  Aortic atherosclerosis. Electronically Signed   By: Marijo Conception, M.D.   On: 10/10/2015 19:44   Dg Wrist Complete Left  Result Date: 09/27/2015 CLINICAL DATA:  Injury. EXAM: LEFT WRIST - COMPLETE 3+ VIEW COMPARISON:  No recent prior .  FINDINGS: Angulated fracture of the distal radius is noted. Probable extension into the joint space is present. Diffuse prominent degenerative change noted. Degenerative changes are particular severe about the first carpometacarpal joint . Loose bodies noted. IMPRESSION: 1. Angulated fracture of the distal left radius. Probable extension to the joint space is present. 2.  Diffuse severe degenerative changes left wrist. Electronically Signed   By: Marcello Moores  Register   On: 09/27/2015 06:46    ASSESSMENT:  Chronic iron deficieny documented by low iron indices dating back to  several years. She has had an EGD and ct scans at [facility] , shwoing diverticulosis. Cannot undergo colonoscopy. She tolerates oral iron poorly due to stomach cramps and diarrhea She has had iV iron infusions in [facility] and most recently at [facility] and has had no reported allergic reactions  She needed atleast 3 units of blood transfusion over the last couple months  Goal is to palliate her symptoms of fatigue and improve her QOL, by bringing her hgb up to at least 10 and maintain at that level.  Therefore we will proceed with Iv Venofer once a week for 4 doses , reassess her response after 4 doses and repeat if necessary.  She has been on oral B12 supplements, will check B 12 level at her next visit in 2 months.  I discussed the side effefcts and benefits of Venofer. She agrees to the plan  PLAN:    Return for IV Venofer to begin next week and given weekly x4 Then follow up back her in 2 months to recheck iron studies.    Patient expressed understanding and was in agreement with this plan. She also understands that She can call clinic at any time with any questions, concerns, or complaints.  Orders Placed This Encounter  Procedures  . CBC with Differential    Standing Status:   Future    Standing Expiration Date:   12/19/2015  . Iron and TIBC    Standing Status:   Future    Standing Expiration Date:   12/19/2015    Creola Corn, MD   10/20/2015 9:32 AM

## 2015-10-21 LAB — WBCS, STOOL: WBCS, STOOL: NONE SEEN

## 2015-10-26 ENCOUNTER — Inpatient Hospital Stay: Payer: Medicare Other

## 2015-10-26 ENCOUNTER — Other Ambulatory Visit: Payer: Self-pay | Admitting: Internal Medicine

## 2015-10-26 VITALS — BP 122/70 | HR 84 | Temp 99.4°F | Resp 18

## 2015-10-26 DIAGNOSIS — D509 Iron deficiency anemia, unspecified: Secondary | ICD-10-CM | POA: Diagnosis not present

## 2015-10-26 DIAGNOSIS — D500 Iron deficiency anemia secondary to blood loss (chronic): Secondary | ICD-10-CM

## 2015-10-26 MED ORDER — SODIUM CHLORIDE 0.9 % IV SOLN: 200.0000 mg | Freq: Once | INTRAVENOUS | Status: DC

## 2015-10-26 MED ORDER — IRON SUCROSE 20 MG/ML IV SOLN
200.0000 mg | Freq: Once | INTRAVENOUS | Status: AC
  Administered 2015-10-26: 200 mg via INTRAVENOUS
  Filled 2015-10-26: qty 10

## 2015-10-26 MED ORDER — SODIUM CHLORIDE 0.9 % IV SOLN
Freq: Once | INTRAVENOUS | Status: AC
  Administered 2015-10-26: 14:00:00 via INTRAVENOUS
  Filled 2015-10-26: qty 1000

## 2015-11-02 ENCOUNTER — Inpatient Hospital Stay: Payer: Medicare Other

## 2015-11-02 VITALS — BP 145/79 | HR 84 | Temp 99.4°F | Resp 18

## 2015-11-02 DIAGNOSIS — D500 Iron deficiency anemia secondary to blood loss (chronic): Secondary | ICD-10-CM

## 2015-11-02 DIAGNOSIS — D509 Iron deficiency anemia, unspecified: Secondary | ICD-10-CM | POA: Diagnosis not present

## 2015-11-02 MED ORDER — SODIUM CHLORIDE 0.9 % IV SOLN: 200.0000 mg | Freq: Once | INTRAVENOUS | Status: DC

## 2015-11-02 MED ORDER — IRON SUCROSE 20 MG/ML IV SOLN
200.0000 mg | Freq: Once | INTRAVENOUS | Status: AC
  Administered 2015-11-02: 200 mg via INTRAVENOUS
  Filled 2015-11-02 (×2): qty 10

## 2015-11-02 MED ORDER — SODIUM CHLORIDE 0.9 % IV SOLN
Freq: Once | INTRAVENOUS | Status: AC
  Administered 2015-11-02: 14:00:00 via INTRAVENOUS
  Filled 2015-11-02: qty 1000

## 2015-11-09 ENCOUNTER — Inpatient Hospital Stay: Payer: Medicare Other

## 2015-11-09 VITALS — BP 146/75 | HR 82 | Temp 98.6°F | Resp 18

## 2015-11-09 DIAGNOSIS — D500 Iron deficiency anemia secondary to blood loss (chronic): Secondary | ICD-10-CM

## 2015-11-09 DIAGNOSIS — D509 Iron deficiency anemia, unspecified: Secondary | ICD-10-CM | POA: Diagnosis not present

## 2015-11-09 MED ORDER — IRON SUCROSE 20 MG/ML IV SOLN: 200.0000 mg | Freq: Once | INTRAVENOUS | Status: DC

## 2015-11-09 MED ORDER — SODIUM CHLORIDE 0.9 % IV SOLN
Freq: Once | INTRAVENOUS | Status: AC
  Administered 2015-11-09: 14:00:00 via INTRAVENOUS
  Filled 2015-11-09: qty 1000

## 2015-11-09 MED ORDER — IRON SUCROSE 20 MG/ML IV SOLN
200.0000 mg | Freq: Once | INTRAVENOUS | Status: AC
Start: 2015-11-09 — End: 2015-11-09
  Administered 2015-11-09: 200 mg via INTRAVENOUS
  Filled 2015-11-09: qty 10

## 2015-11-16 ENCOUNTER — Inpatient Hospital Stay: Payer: Medicare Other | Attending: Internal Medicine

## 2015-11-16 VITALS — BP 159/74 | HR 89 | Temp 98.2°F | Resp 18

## 2015-11-16 DIAGNOSIS — Z79899 Other long term (current) drug therapy: Secondary | ICD-10-CM | POA: Insufficient documentation

## 2015-11-16 DIAGNOSIS — D509 Iron deficiency anemia, unspecified: Secondary | ICD-10-CM | POA: Diagnosis present

## 2015-11-16 DIAGNOSIS — D500 Iron deficiency anemia secondary to blood loss (chronic): Secondary | ICD-10-CM

## 2015-11-16 MED ORDER — IRON SUCROSE 20 MG/ML IV SOLN
200.0000 mg | Freq: Once | INTRAVENOUS | Status: AC
  Administered 2015-11-16: 200 mg via INTRAVENOUS
  Filled 2015-11-16: qty 10

## 2015-11-16 MED ORDER — IRON SUCROSE 20 MG/ML IV SOLN: 200.0000 mg | Freq: Once | INTRAVENOUS | Status: DC

## 2015-11-16 MED ORDER — SODIUM CHLORIDE 0.9 % IV SOLN
Freq: Once | INTRAVENOUS | Status: AC
  Administered 2015-11-16: 14:00:00 via INTRAVENOUS
  Filled 2015-11-16: qty 1000

## 2015-12-21 ENCOUNTER — Ambulatory Visit: Payer: Medicare Other

## 2015-12-21 ENCOUNTER — Other Ambulatory Visit: Payer: Medicare Other

## 2015-12-24 ENCOUNTER — Ambulatory Visit: Payer: Medicare Other | Admitting: Occupational Therapy

## 2015-12-29 ENCOUNTER — Ambulatory Visit: Payer: Medicare Other

## 2015-12-29 ENCOUNTER — Other Ambulatory Visit: Payer: Medicare Other

## 2016-01-13 ENCOUNTER — Inpatient Hospital Stay: Payer: Medicare Other

## 2016-01-13 ENCOUNTER — Inpatient Hospital Stay: Payer: Medicare Other | Admitting: Oncology

## 2016-01-13 ENCOUNTER — Other Ambulatory Visit: Payer: Self-pay | Admitting: *Deleted

## 2016-01-13 DIAGNOSIS — D500 Iron deficiency anemia secondary to blood loss (chronic): Secondary | ICD-10-CM

## 2016-01-13 NOTE — Progress Notes (Deleted)
Hematology/Oncology Consult note [facility]  Telephone:([phone] Fax:(336) [phone]  Patient Care Team: Gearldine Shown, DO as PCP - General (Family Medicine)   Name of the patient: [name] [name]  [id number]  [birth year]   Date of visit: 01/13/16  Diagnosis- ***  Chief complaint/ Reason for visit- ***  Heme/Onc history: ***  Interval history- ***  ECOG PS- *** Pain scale- *** Opioid associated constipation- ***  Review of systems- ROS   Current treatment- ***  Allergies  Allergen Reactions  . Codeine Nausea And Vomiting  . Dextrans Hives  . Fentanyl Itching  . Lipitor [Atorvastatin] Other (See Comments)    Reaction: Muscle pain  . Mobic [Meloxicam] Other (See Comments)    Reaction: Mouth and tongue ulcers  . Niacin And Related Itching  . Nsaids Hives  . Pravachol [Pravastatin Sodium] Other (See Comments)    Reaction: Muscle pain  . Voltaren [Diclofenac Sodium] Other (See Comments)    Reaction: Mouth and tongue ulcers  . Zetia [Ezetimibe] Other (See Comments)    Reaction: Leg pain     Past Medical History:  Diagnosis Date  . COPD (chronic obstructive pulmonary disease) ([place])   . Diverticulitis   . GERD (gastroesophageal reflux disease)   . Glaucoma   . HLD (hyperlipidemia)   . Hypertension   . Rectal bleeding      Past Surgical History:  Procedure Laterality Date  . ABDOMINAL HYSTERECTOMY    . APPENDECTOMY    . ESOPHAGOGASTRODUODENOSCOPY (EGD) WITH PROPOFOL N/A 08/12/2015   Procedure: ESOPHAGOGASTRODUODENOSCOPY (EGD) WITH PROPOFOL;  Surgeon: Lollie Sails, MD;  [facility];  Service: Endoscopy;  Laterality: N/A;  . JOINT REPLACEMENT      Social History   Social History  . Marital status: Widowed    Spouse name: N/A  . Number of children: N/A  . Years of education: N/A   Occupational History  . Not on file.   Social History Main Topics  . Smoking status: Former Research scientist (life sciences)  . Smokeless  tobacco: Never Used  . Alcohol use No  . Drug use: No  . Sexual activity: Not on file   Other Topics Concern  . Not on file   Social History Narrative  . No narrative on file    Family History  Problem Relation [name] of Onset  . CAD Mother   . CAD Father   . CAD Brother      Current Outpatient Prescriptions:  .  acetaminophen (TYLENOL) 650 MG CR tablet, Take 650 mg by mouth every 8 (eight) hours as needed for pain., Disp: , Rfl:  .  albuterol (PROVENTIL HFA;VENTOLIN HFA) 108 (90 Base) MCG/ACT inhaler, Inhale 2 puffs into the lungs every 4 (four) hours as needed for wheezing or shortness of breath., Disp: , Rfl:  .  Coenzyme Q10 (GNP CO Q10) 100 MG capsule, Take 200 mg by mouth daily., Disp: , Rfl:  .  colchicine 0.6 MG tablet, Take by mouth., Disp: , Rfl:  .  Cyanocobalamin (B-12) 1000 MCG CAPS, Take 2,000 Units by mouth 2 (two) times daily., Disp: , Rfl:  .  cycloSPORINE (RESTASIS) 0.05 % ophthalmic emulsion, Place 1 drop into both eyes 2 (two) times daily., Disp: , Rfl:  .  fluticasone (FLONASE) 50 MCG/ACT nasal spray, USE TWO SPRAYS IN BOTH NOSTRILS ONCE DAILY, Disp: , Rfl:  .  gabapentin (NEURONTIN) 300 MG capsule, Take 300 mg by mouth 2 (two) times daily., Disp: , Rfl:  .  guaiFENesin (MUCINEX) 600 MG 12 hr tablet, Take 600 mg by mouth 2 (two) times daily as needed., Disp: , Rfl:  .  HYDROcodone-acetaminophen (NORCO/VICODIN) 5-325 MG tablet, Take 1 tablet by mouth every 4 (four) hours as needed for moderate pain., Disp: 12 tablet, Rfl: 0 .  hydrocortisone (PROCTOSOL HC) 2.5 % rectal cream, PLACE RECTALLY TWICE A DAY, Disp: , Rfl:  .  loratadine (CLARITIN) 10 MG tablet, Take 10 mg by mouth daily as needed for allergies., Disp: , Rfl:  .  losartan (COZAAR) 50 MG tablet, Take 50 mg by mouth daily., Disp: , Rfl:  .  Multiple Vitamins-Minerals (OCUVITE PRESERVISION PO), Take 1 tablet by mouth 2 (two) times daily., Disp: , Rfl:  .  PARoxetine (PAXIL) 40 MG tablet, Take 40 mg by mouth  every morning., Disp: , Rfl:  .  Polyethyl Glycol-Propyl Glycol (SYSTANE) 0.4-0.3 % SOLN, Apply 1 drop to eye as needed., Disp: , Rfl:  .  potassium chloride (K-DUR,KLOR-CON) 10 MEQ tablet, Take 10 mEq by mouth 2 (two) times daily., Disp: , Rfl:  .  RA KRILL OIL 500 MG CAPS, Take by mouth., Disp: , Rfl:  .  RABEprazole (ACIPHEX) 20 MG tablet, Take 20 mg by mouth 2 (two) times daily., Disp: , Rfl:  .  sucralfate (CARAFATE) 1 g tablet, Take 1 tablet (1 g total) by mouth 4 (four) times daily -  with meals and at bedtime., Disp: 120 tablet, Rfl: 1 .  timolol (TIMOPTIC-XR) 0.5 % ophthalmic gel-forming, Place 1 drop into both eyes daily., Disp: , Rfl:  .  valACYclovir (VALTREX) 1000 MG tablet, once daily as needed. Reported on 03/22/2015, Disp: , Rfl:  .  Vitamin D, Ergocalciferol, (DRISDOL) 50000 units CAPS capsule, Take by mouth., Disp: , Rfl:   Physical exam: There were no vitals filed for this visit. Physical Exam   CMP Latest Ref Rng & Units 10/13/2015  Glucose 65 - 99 mg/dL 95  BUN 6 - 20 mg/dL 15  Creatinine 0.44 - 1.00 mg/dL 0.67  Sodium 135 - 145 mmol/L 132(L)  Potassium 3.5 - 5.1 mmol/L 3.8  Chloride 101 - 111 mmol/L 99(L)  CO2 22 - 32 mmol/L 27  Calcium 8.9 - 10.3 mg/dL 8.8(L)  Total Protein 6.5 - 8.1 g/dL -  Total Bilirubin 0.3 - 1.2 mg/dL -  Alkaline Phos 38 - 126 U/L -  AST 15 - 41 U/L -  ALT 14 - 54 U/L -   CBC Latest Ref Rng & Units 10/19/2015  WBC 3.6 - 11.0 K/uL 9.4  Hemoglobin 12.0 - 16.0 g/dL 8.2(L)  Hematocrit 35.0 - 47.0 % 25.0(L)  Platelets 150 - 440 K/uL 641(H)    No images are attached to the encounter.  No results found.   Assessment and plan- Patient is a 83 y.o. female ***   Visit Diagnosis No diagnosis found.   Dr. Randa Evens, MD, MPH [place] [facility] Pager- [phone] 01/13/2016 8:02 AM

## 2016-01-21 ENCOUNTER — Inpatient Hospital Stay: Payer: Medicare Other | Attending: Oncology | Admitting: Oncology

## 2016-01-21 ENCOUNTER — Inpatient Hospital Stay: Payer: Medicare Other

## 2016-01-21 ENCOUNTER — Encounter: Payer: Self-pay | Admitting: Oncology

## 2016-01-21 VITALS — BP 138/84 | HR 86 | Temp 98.3°F | Resp 18 | Wt 181.0 lb

## 2016-01-21 DIAGNOSIS — I10 Essential (primary) hypertension: Secondary | ICD-10-CM | POA: Insufficient documentation

## 2016-01-21 DIAGNOSIS — Z79899 Other long term (current) drug therapy: Secondary | ICD-10-CM

## 2016-01-21 DIAGNOSIS — J449 Chronic obstructive pulmonary disease, unspecified: Secondary | ICD-10-CM | POA: Insufficient documentation

## 2016-01-21 DIAGNOSIS — D509 Iron deficiency anemia, unspecified: Secondary | ICD-10-CM | POA: Insufficient documentation

## 2016-01-21 DIAGNOSIS — D500 Iron deficiency anemia secondary to blood loss (chronic): Secondary | ICD-10-CM

## 2016-01-21 DIAGNOSIS — Z87891 Personal history of nicotine dependence: Secondary | ICD-10-CM | POA: Diagnosis not present

## 2016-01-21 DIAGNOSIS — K5790 Diverticulosis of intestine, part unspecified, without perforation or abscess without bleeding: Secondary | ICD-10-CM

## 2016-01-21 DIAGNOSIS — K219 Gastro-esophageal reflux disease without esophagitis: Secondary | ICD-10-CM | POA: Diagnosis not present

## 2016-01-21 DIAGNOSIS — E785 Hyperlipidemia, unspecified: Secondary | ICD-10-CM | POA: Diagnosis not present

## 2016-01-21 LAB — CBC WITH DIFFERENTIAL/PLATELET
Basophils Absolute: 0.2 10*3/uL — ABNORMAL HIGH (ref 0–0.1)
Basophils Relative: 2 %
EOS ABS: 0.3 10*3/uL (ref 0–0.7)
EOS PCT: 4 %
HCT: 35 % (ref 35.0–47.0)
Hemoglobin: 11.6 g/dL — ABNORMAL LOW (ref 12.0–16.0)
LYMPHS ABS: 1.6 10*3/uL (ref 1.0–3.6)
LYMPHS PCT: 21 %
MCH: 26.5 pg (ref 26.0–34.0)
MCHC: 33.2 g/dL (ref 32.0–36.0)
MCV: 79.9 fL — AB (ref 80.0–100.0)
MONO ABS: 1.1 10*3/uL — AB (ref 0.2–0.9)
MONOS PCT: 14 %
Neutro Abs: 4.5 10*3/uL (ref 1.4–6.5)
Neutrophils Relative %: 59 %
PLATELETS: 320 10*3/uL (ref 150–440)
RBC: 4.38 MIL/uL (ref 3.80–5.20)
RDW: 17.4 % — ABNORMAL HIGH (ref 11.5–14.5)
WBC: 7.6 10*3/uL (ref 3.6–11.0)

## 2016-01-21 LAB — IRON AND TIBC
IRON: 32 ug/dL (ref 28–170)
Saturation Ratios: 7 % — ABNORMAL LOW (ref 10.4–31.8)
TIBC: 432 ug/dL (ref 250–450)
UIBC: 400 ug/dL

## 2016-01-21 LAB — VITAMIN B12: Vitamin B-12: 6640 pg/mL — ABNORMAL HIGH (ref 180–914)

## 2016-01-21 LAB — FERRITIN: FERRITIN: 24 ng/mL (ref 11–307)

## 2016-01-21 NOTE — Progress Notes (Signed)
Hematology/Oncology Consult note [facility]  [phone] [phone]  Patient Care Team: Gearldine Shown, DO as PCP - General (Family Medicine)   Name of the patient: [name] [name]  [id number]  1925/06/10   Date of visit: 01/21/16  Diagnosis- chronic iron deficiency anemia  Chief complaint/ Reason for visit- routine follow-up of iron deficiency anemia  Heme/Onc history: Patient is a 83 year old female who was last seen by Dr. Sherrine Maples in October 2017. Patient has a history of chronic iron deficiency anemia over several years. She has had EGD and CT scans at [facility] which showed diverticulosis. She cannot undergo colonoscopy at this time and she tolerates oral iron poorly due to stomach cramps and diarrhea. She has received IV iron infusions you're at [facility] as well as you and has tolerated it well without any significant side effects. She does become anemic from time to time and has even required blood transfusions in the past. She received 4 doses of IV Venofer starting October 2017. At this point she is getting IV iron and blood transfusion on a palliative basis to keep her hemoglobin close to 10   Interval history- reports no rales bladder or urine or stool. She is otherwise active for her age but does have problems of macular degeneration which limits her driving  ECOG PS- 1   Review of systems- Review of Systems  Constitutional: Negative for chills, fever, malaise/fatigue and weight loss.  HENT: Negative for congestion, ear discharge and nosebleeds.   Eyes: Negative for blurred vision.  Respiratory: Negative for cough, hemoptysis, sputum production, shortness of breath and wheezing.   Cardiovascular: Negative for chest pain, palpitations, orthopnea and claudication.  Gastrointestinal: Negative for abdominal pain, blood in stool, constipation, diarrhea, heartburn, melena, nausea and vomiting.    Genitourinary: Negative for dysuria, flank pain, frequency, hematuria and urgency.  Musculoskeletal: Negative for back pain, joint pain and myalgias.  Skin: Negative for rash.  Neurological: Negative for dizziness, tingling, focal weakness, seizures, weakness and headaches.  Endo/Heme/Allergies: Does not bruise/bleed easily.  Psychiatric/Behavioral: Negative for depression and suicidal ideas. The patient does not have insomnia.      Current treatment- venofer  Allergies  Allergen Reactions  . Codeine Nausea And Vomiting  . Dextrans Hives  . Fentanyl Itching  . Lipitor [Atorvastatin] Other (See Comments)    Reaction: Muscle pain  . Mobic [Meloxicam] Other (See Comments)    Reaction: Mouth and tongue ulcers  . Niacin And Related Itching  . Nsaids Hives  . Pravachol [Pravastatin Sodium] Other (See Comments)    Reaction: Muscle pain  . Voltaren [Diclofenac Sodium] Other (See Comments)    Reaction: Mouth and tongue ulcers  . Zetia [Ezetimibe] Other (See Comments)    Reaction: Leg pain     Past Medical History:  Diagnosis Date  . COPD (chronic obstructive pulmonary disease) ([place])   . Diverticulitis   . GERD (gastroesophageal reflux disease)   . Glaucoma   . HLD (hyperlipidemia)   . Hypertension   . Rectal bleeding      Past Surgical History:  Procedure Laterality Date  . ABDOMINAL HYSTERECTOMY    . APPENDECTOMY    . ESOPHAGOGASTRODUODENOSCOPY (EGD) WITH PROPOFOL N/A 08/12/2015   Procedure: ESOPHAGOGASTRODUODENOSCOPY (EGD) WITH PROPOFOL;  Surgeon: Lollie Sails, MD;  Location: [facility] ENDOSCOPY;  Service: Endoscopy;  Laterality: N/A;  . JOINT REPLACEMENT      Social History   Social History  . Marital status: Widowed  Spouse name: N/A  . Number of children: N/A  . Years of education: N/A   Occupational History  . Not on file.   Social History Main Topics  . Smoking status: Former Research scientist (life sciences)  . Smokeless tobacco: Never Used  . Alcohol use No  . Drug use: No   . Sexual activity: Not on file   Other Topics Concern  . Not on file   Social History Narrative  . No narrative on file    Family History  Problem Relation Age of Onset  . CAD Mother   . CAD Father   . CAD Brother      Current Outpatient Prescriptions:  .  acetaminophen (TYLENOL) 650 MG CR tablet, Take 650 mg by mouth every 8 (eight) hours as needed for pain., Disp: , Rfl:  .  albuterol (PROVENTIL HFA;VENTOLIN HFA) 108 (90 Base) MCG/ACT inhaler, Inhale 2 puffs into the lungs every 4 (four) hours as needed for wheezing or shortness of breath., Disp: , Rfl:  .  Coenzyme Q10 (GNP CO Q10) 100 MG capsule, Take 200 mg by mouth daily., Disp: , Rfl:  .  colchicine 0.6 MG tablet, Take by mouth., Disp: , Rfl:  .  Cyanocobalamin (B-12) 1000 MCG CAPS, Take 2,000 Units by mouth 2 (two) times daily., Disp: , Rfl:  .  cycloSPORINE (RESTASIS) 0.05 % ophthalmic emulsion, Place 1 drop into both eyes 2 (two) times daily., Disp: , Rfl:  .  fluticasone (FLONASE) 50 MCG/ACT nasal spray, USE TWO SPRAYS IN BOTH NOSTRILS ONCE DAILY, Disp: , Rfl:  .  gabapentin (NEURONTIN) 300 MG capsule, Take 300 mg by mouth 2 (two) times daily., Disp: , Rfl:  .  guaiFENesin (MUCINEX) 600 MG 12 hr tablet, Take 600 mg by mouth 2 (two) times daily as needed., Disp: , Rfl:  .  HYDROcodone-acetaminophen (NORCO/VICODIN) 5-325 MG tablet, Take 1 tablet by mouth every 4 (four) hours as needed for moderate pain., Disp: 12 tablet, Rfl: 0 .  hydrocortisone (PROCTOSOL HC) 2.5 % rectal cream, PLACE RECTALLY TWICE A DAY, Disp: , Rfl:  .  loratadine (CLARITIN) 10 MG tablet, Take 10 mg by mouth daily as needed for allergies., Disp: , Rfl:  .  losartan (COZAAR) 50 MG tablet, Take 50 mg by mouth daily., Disp: , Rfl:  .  Multiple Vitamins-Minerals (OCUVITE PRESERVISION PO), Take 1 tablet by mouth 2 (two) times daily., Disp: , Rfl:  .  PARoxetine (PAXIL) 40 MG tablet, Take 40 mg by mouth every morning., Disp: , Rfl:  .  Polyethyl  Glycol-Propyl Glycol (SYSTANE) 0.4-0.3 % SOLN, Apply 1 drop to eye as needed., Disp: , Rfl:  .  potassium chloride (K-DUR,KLOR-CON) 10 MEQ tablet, Take 10 mEq by mouth 2 (two) times daily., Disp: , Rfl:  .  RA KRILL OIL 500 MG CAPS, Take by mouth., Disp: , Rfl:  .  RABEprazole (ACIPHEX) 20 MG tablet, Take 20 mg by mouth 2 (two) times daily., Disp: , Rfl:  .  sucralfate (CARAFATE) 1 g tablet, Take 1 tablet (1 g total) by mouth 4 (four) times daily -  with meals and at bedtime., Disp: 120 tablet, Rfl: 1 .  timolol (TIMOPTIC-XR) 0.5 % ophthalmic gel-forming, Place 1 drop into both eyes daily., Disp: , Rfl:  .  valACYclovir (VALTREX) 1000 MG tablet, once daily as needed. Reported on 03/22/2015, Disp: , Rfl:  .  Vitamin D, Ergocalciferol, (DRISDOL) 50000 units CAPS capsule, Take by mouth., Disp: , Rfl:   Physical exam:  Vitals:  01/21/16 1436  BP: 138/84  Pulse: 86  Resp: 18  Temp: 98.3 F (36.8 C)  TempSrc: Oral  Weight: 181 lb (82.1 kg)   Physical Exam  Constitutional: She is oriented to person, place, and time and well-developed, well-nourished, and in no distress.  She is ambulating with a cane  HENT:  Head: Normocephalic and atraumatic.  Eyes: EOM are normal. Pupils are equal, round, and reactive to light.  Neck: Normal range of motion.  Cardiovascular: Normal rate, regular rhythm and normal heart sounds.   Pulmonary/Chest: Effort normal and breath sounds normal.  Abdominal: Soft. Bowel sounds are normal.  Neurological: She is alert and oriented to person, place, and time.  Skin: Skin is warm and dry.     CMP Latest Ref Rng & Units 10/13/2015  Glucose 65 - 99 mg/dL 95  BUN 6 - 20 mg/dL 15  Creatinine 0.44 - 1.00 mg/dL 0.67  Sodium 135 - 145 mmol/L 132(L)  Potassium 3.5 - 5.1 mmol/L 3.8  Chloride 101 - 111 mmol/L 99(L)  CO2 22 - 32 mmol/L 27  Calcium 8.9 - 10.3 mg/dL 8.8(L)  Total Protein 6.5 - 8.1 g/dL -  Total Bilirubin 0.3 - 1.2 mg/dL -  Alkaline Phos 38 - 126 U/L -   AST 15 - 41 U/L -  ALT 14 - 54 U/L -   CBC Latest Ref Rng & Units 01/21/2016  WBC 3.6 - 11.0 K/uL 7.6  Hemoglobin 12.0 - 16.0 g/dL 11.6(L)  Hematocrit 35.0 - 47.0 % 35.0  Platelets 150 - 440 K/uL 320     Assessment and plan- Patient is a 83 y.o. female who sees [place] for iron deficiency anemia  1. Patient's H&H is significantly improved to 11.6/35 today. Her iron studies are pending. At this time we can hold off on IV iron unless her ferritin comes back significantly low. We will see her back in 4 months time to assess need for more IV iron but I will check an interim CBC with iron studies at 2 months  2. Thrombocytosis-likely reactive secondary to iron deficiency anemia. It has now resolved    Visit Diagnosis 1. Iron deficiency anemia due to chronic blood loss      Dr. Randa Evens, MD, MPH [facility] at [facility] Pager- [phone] 01/21/2016 11:28 AM

## 2016-01-21 NOTE — Progress Notes (Signed)
Patient is here for follow up, she is doing well, she does have general aches and pains.

## 2016-03-20 ENCOUNTER — Inpatient Hospital Stay: Payer: Medicare Other | Attending: Oncology

## 2016-03-20 DIAGNOSIS — D500 Iron deficiency anemia secondary to blood loss (chronic): Secondary | ICD-10-CM | POA: Diagnosis present

## 2016-03-20 DIAGNOSIS — Z79899 Other long term (current) drug therapy: Secondary | ICD-10-CM | POA: Insufficient documentation

## 2016-03-20 LAB — CBC WITH DIFFERENTIAL/PLATELET
BASOS ABS: 0.2 10*3/uL — AB (ref 0–0.1)
BASOS PCT: 3 %
EOS ABS: 0.3 10*3/uL (ref 0–0.7)
EOS PCT: 4 %
HCT: 29.4 % — ABNORMAL LOW (ref 35.0–47.0)
Hemoglobin: 9.7 g/dL — ABNORMAL LOW (ref 12.0–16.0)
Lymphocytes Relative: 21 %
Lymphs Abs: 1.4 10*3/uL (ref 1.0–3.6)
MCH: 25.2 pg — ABNORMAL LOW (ref 26.0–34.0)
MCHC: 33 g/dL (ref 32.0–36.0)
MCV: 76.4 fL — ABNORMAL LOW (ref 80.0–100.0)
MONO ABS: 0.9 10*3/uL (ref 0.2–0.9)
Monocytes Relative: 14 %
Neutro Abs: 3.8 10*3/uL (ref 1.4–6.5)
Neutrophils Relative %: 58 %
PLATELETS: 406 10*3/uL (ref 150–440)
RBC: 3.85 MIL/uL (ref 3.80–5.20)
RDW: 14.6 % — AB (ref 11.5–14.5)
WBC: 6.6 10*3/uL (ref 3.6–11.0)

## 2016-03-20 LAB — IRON AND TIBC
Iron: 18 ug/dL — ABNORMAL LOW (ref 28–170)
SATURATION RATIOS: 4 % — AB (ref 10.4–31.8)
TIBC: 447 ug/dL (ref 250–450)
UIBC: 429 ug/dL

## 2016-03-20 LAB — FERRITIN: Ferritin: 7 ng/mL — ABNORMAL LOW (ref 11–307)

## 2016-03-21 ENCOUNTER — Other Ambulatory Visit: Payer: Self-pay | Admitting: Oncology

## 2016-03-21 ENCOUNTER — Telehealth: Payer: Self-pay | Admitting: *Deleted

## 2016-03-21 NOTE — Telephone Encounter (Signed)
Told pt that her ferritin was low and dr Janese Banks would like to give her 2 doses of feraheme.  She wants to come on tues, thurs, and fri.  I will send a message to scheduling and they will contact the patient and let her know of the dates.she is agreeable to the above

## 2016-03-21 NOTE — Telephone Encounter (Signed)
-----   Message from Sindy Guadeloupe, MD sent at 03/21/2016  8:11 AM EDT ----- I have put in orders for 2 doses of feraheme based on her blood work   ----- Message ----- From: Interface, Lab In [place]: 03/20/2016   1:19 PM To: Sindy Guadeloupe, MD

## 2016-03-23 ENCOUNTER — Inpatient Hospital Stay: Payer: Medicare Other

## 2016-03-23 VITALS — BP 156/74 | HR 85 | Temp 98.0°F | Resp 18

## 2016-03-23 DIAGNOSIS — D500 Iron deficiency anemia secondary to blood loss (chronic): Secondary | ICD-10-CM

## 2016-03-23 MED ORDER — SODIUM CHLORIDE 0.9 % IV SOLN
510.0000 mg | Freq: Once | INTRAVENOUS | Status: AC
  Administered 2016-03-23: 510 mg via INTRAVENOUS
  Filled 2016-03-23: qty 17

## 2016-03-30 ENCOUNTER — Inpatient Hospital Stay: Payer: Medicare Other

## 2016-03-30 VITALS — BP 145/68 | HR 90 | Temp 98.6°F | Resp 18

## 2016-03-30 DIAGNOSIS — D500 Iron deficiency anemia secondary to blood loss (chronic): Secondary | ICD-10-CM

## 2016-03-30 MED ORDER — SODIUM CHLORIDE 0.9 % IV SOLN
Freq: Once | INTRAVENOUS | Status: AC
  Administered 2016-03-30: 13:00:00 via INTRAVENOUS
  Filled 2016-03-30: qty 1000

## 2016-03-30 MED ORDER — SODIUM CHLORIDE 0.9 % IV SOLN
510.0000 mg | Freq: Once | INTRAVENOUS | Status: AC
  Administered 2016-03-30: 510 mg via INTRAVENOUS
  Filled 2016-03-30: qty 17

## 2016-03-31 ENCOUNTER — Other Ambulatory Visit: Payer: Self-pay

## 2016-03-31 ENCOUNTER — Inpatient Hospital Stay
Admission: EM | Admit: 2016-03-31 | Discharge: 2016-04-04 | DRG: 378 | Disposition: A | Discharge status: Home / Self Care | Payer: Medicare Other | Attending: Specialist | Admitting: Specialist

## 2016-03-31 ENCOUNTER — Encounter: Payer: Self-pay | Admitting: Intensive Care

## 2016-03-31 DIAGNOSIS — K219 Gastro-esophageal reflux disease without esophagitis: Secondary | ICD-10-CM | POA: Diagnosis present

## 2016-03-31 DIAGNOSIS — K5730 Diverticulosis of large intestine without perforation or abscess without bleeding: Secondary | ICD-10-CM | POA: Diagnosis present

## 2016-03-31 DIAGNOSIS — E785 Hyperlipidemia, unspecified: Secondary | ICD-10-CM | POA: Diagnosis present

## 2016-03-31 DIAGNOSIS — K921 Melena: Secondary | ICD-10-CM

## 2016-03-31 DIAGNOSIS — Z90710 Acquired absence of both cervix and uterus: Secondary | ICD-10-CM

## 2016-03-31 DIAGNOSIS — Z79891 Long term (current) use of opiate analgesic: Secondary | ICD-10-CM | POA: Diagnosis not present

## 2016-03-31 DIAGNOSIS — Z87891 Personal history of nicotine dependence: Secondary | ICD-10-CM | POA: Diagnosis not present

## 2016-03-31 DIAGNOSIS — Z9660 Presence of unspecified orthopedic joint implant: Secondary | ICD-10-CM | POA: Diagnosis present

## 2016-03-31 DIAGNOSIS — Z7951 Long term (current) use of inhaled steroids: Secondary | ICD-10-CM | POA: Diagnosis not present

## 2016-03-31 DIAGNOSIS — E876 Hypokalemia: Secondary | ICD-10-CM | POA: Diagnosis not present

## 2016-03-31 DIAGNOSIS — M109 Gout, unspecified: Secondary | ICD-10-CM | POA: Diagnosis present

## 2016-03-31 DIAGNOSIS — R197 Diarrhea, unspecified: Secondary | ICD-10-CM | POA: Diagnosis present

## 2016-03-31 DIAGNOSIS — Z79899 Other long term (current) drug therapy: Secondary | ICD-10-CM

## 2016-03-31 DIAGNOSIS — H409 Unspecified glaucoma: Secondary | ICD-10-CM | POA: Diagnosis present

## 2016-03-31 DIAGNOSIS — I10 Essential (primary) hypertension: Secondary | ICD-10-CM | POA: Diagnosis present

## 2016-03-31 DIAGNOSIS — Z8711 Personal history of peptic ulcer disease: Secondary | ICD-10-CM

## 2016-03-31 DIAGNOSIS — D500 Iron deficiency anemia secondary to blood loss (chronic): Secondary | ICD-10-CM | POA: Diagnosis present

## 2016-03-31 DIAGNOSIS — F418 Other specified anxiety disorders: Secondary | ICD-10-CM | POA: Diagnosis present

## 2016-03-31 DIAGNOSIS — J449 Chronic obstructive pulmonary disease, unspecified: Secondary | ICD-10-CM | POA: Diagnosis present

## 2016-03-31 DIAGNOSIS — K449 Diaphragmatic hernia without obstruction or gangrene: Secondary | ICD-10-CM | POA: Diagnosis present

## 2016-03-31 DIAGNOSIS — D62 Acute posthemorrhagic anemia: Secondary | ICD-10-CM | POA: Diagnosis present

## 2016-03-31 DIAGNOSIS — Z8249 Family history of ischemic heart disease and other diseases of the circulatory system: Secondary | ICD-10-CM

## 2016-03-31 DIAGNOSIS — G629 Polyneuropathy, unspecified: Secondary | ICD-10-CM | POA: Diagnosis present

## 2016-03-31 DIAGNOSIS — K922 Gastrointestinal hemorrhage, unspecified: Secondary | ICD-10-CM | POA: Diagnosis present

## 2016-03-31 HISTORY — DX: Iron deficiency: E61.1

## 2016-03-31 HISTORY — DX: Anemia, unspecified: D64.9

## 2016-03-31 LAB — HEPATIC FUNCTION PANEL
ALBUMIN: 3.7 g/dL (ref 3.5–5.0)
ALK PHOS: 60 U/L (ref 38–126)
ALT: 17 U/L (ref 14–54)
AST: 26 U/L (ref 15–41)
Bilirubin, Direct: <0.1 mg/dL — ABNORMAL LOW (ref 0.1–0.5)
TOTAL PROTEIN: 6.9 g/dL (ref 6.5–8.1)
Total Bilirubin: 0.6 mg/dL (ref 0.3–1.2)

## 2016-03-31 LAB — CBC WITH DIFFERENTIAL/PLATELET
BASOS ABS: 0.1 10*3/uL (ref 0–0.1)
Basophils Relative: 1 %
Eosinophils Absolute: 0.1 10*3/uL (ref 0–0.7)
Eosinophils Relative: 1 %
HEMATOCRIT: 27.2 % — AB (ref 35.0–47.0)
Hemoglobin: 8.9 g/dL — ABNORMAL LOW (ref 12.0–16.0)
LYMPHS ABS: 1.2 10*3/uL (ref 1.0–3.6)
LYMPHS PCT: 12 %
MCH: 25.8 pg — AB (ref 26.0–34.0)
MCHC: 32.8 g/dL (ref 32.0–36.0)
MCV: 78.8 fL — AB (ref 80.0–100.0)
Monocytes Absolute: 0.7 10*3/uL (ref 0.2–0.9)
Monocytes Relative: 7 %
NEUTROS ABS: 8.4 10*3/uL — AB (ref 1.4–6.5)
NEUTROS PCT: 79 %
Platelets: 378 10*3/uL (ref 150–440)
RBC: 3.45 MIL/uL — ABNORMAL LOW (ref 3.80–5.20)
RDW: 16.3 % — ABNORMAL HIGH (ref 11.5–14.5)
WBC: 10.6 10*3/uL (ref 3.6–11.0)

## 2016-03-31 LAB — BASIC METABOLIC PANEL
ANION GAP: 10 (ref 5–15)
BUN: 52 mg/dL — AB (ref 6–20)
CHLORIDE: 98 mmol/L — AB (ref 101–111)
CO2: 26 mmol/L (ref 22–32)
Calcium: 8.9 mg/dL (ref 8.9–10.3)
Creatinine, Ser: 0.65 mg/dL (ref 0.44–1.00)
Glucose, Bld: 152 mg/dL — ABNORMAL HIGH (ref 65–99)
POTASSIUM: 3.5 mmol/L (ref 3.5–5.1)
SODIUM: 134 mmol/L — AB (ref 135–145)

## 2016-03-31 LAB — PROTIME-INR
INR: 0.98
Prothrombin Time: 13 seconds (ref 11.4–15.2)

## 2016-03-31 LAB — TROPONIN I: Troponin I: <0.03 ng/mL (ref <0.03)

## 2016-03-31 MED ORDER — MOMETASONE FUROATE 0.1 % EX SOLN: Freq: Every day | CUTANEOUS | Status: DC

## 2016-03-31 MED ORDER — SODIUM CHLORIDE 0.9 % IV SOLN
8.0000 mg/h | INTRAVENOUS | Status: DC
  Administered 2016-03-31 – 2016-04-03 (×6): 8 mg/h via INTRAVENOUS
  Filled 2016-03-31 (×5): qty 80

## 2016-03-31 MED ORDER — COLCHICINE 0.6 MG PO TABS
0.6000 mg | ORAL_TABLET | Freq: Every day | ORAL | Status: DC
  Administered 2016-04-02: 0.6 mg via ORAL
  Filled 2016-03-31 (×3): qty 1

## 2016-03-31 MED ORDER — TETRAHYDROZ-DEXTRAN-PEG-POVID 0.05-0.1-1-1 % OP SOLN: 1.0000 [drp] | Freq: Two times a day (BID) | OPHTHALMIC | Status: DC

## 2016-03-31 MED ORDER — LATANOPROST 0.005 % OP SOLN
1.0000 [drp] | Freq: Every day | OPHTHALMIC | Status: DC
  Administered 2016-03-31 – 2016-04-02 (×3): 1 [drp] via OPHTHALMIC
  Filled 2016-03-31 (×2): qty 2.5

## 2016-03-31 MED ORDER — ACETAMINOPHEN 325 MG PO TABS: 650.0000 mg | ORAL_TABLET | Freq: Four times a day (QID) | ORAL | Status: DC | PRN

## 2016-03-31 MED ORDER — GABAPENTIN 300 MG PO CAPS
300.0000 mg | ORAL_CAPSULE | Freq: Two times a day (BID) | ORAL | Status: DC
  Administered 2016-03-31 – 2016-04-04 (×6): 300 mg via ORAL
  Filled 2016-03-31 (×6): qty 1

## 2016-03-31 MED ORDER — LORATADINE 10 MG PO TABS: 10.0000 mg | ORAL_TABLET | Freq: Every day | ORAL | Status: DC | PRN

## 2016-03-31 MED ORDER — SODIUM CHLORIDE 0.9 % IV SOLN
80.0000 mg | Freq: Once | INTRAVENOUS | Status: AC
  Administered 2016-03-31: 18:00:00 80 mg via INTRAVENOUS
  Filled 2016-03-31: qty 80

## 2016-03-31 MED ORDER — ACETAMINOPHEN 650 MG RE SUPP: 650.0000 mg | Freq: Four times a day (QID) | RECTAL | Status: DC | PRN

## 2016-03-31 MED ORDER — POLYVINYL ALCOHOL 1.4 % OP SOLN
1.0000 [drp] | OPHTHALMIC | Status: DC | PRN
  Filled 2016-03-31: qty 15

## 2016-03-31 MED ORDER — LOSARTAN POTASSIUM 50 MG PO TABS
50.0000 mg | ORAL_TABLET | Freq: Every day | ORAL | Status: DC
  Administered 2016-03-31 – 2016-04-04 (×3): 50 mg via ORAL
  Filled 2016-03-31 (×3): qty 1

## 2016-03-31 MED ORDER — CYCLOSPORINE 0.05 % OP EMUL
1.0000 [drp] | Freq: Two times a day (BID) | OPHTHALMIC | Status: DC
  Administered 2016-03-31 – 2016-04-04 (×7): 1 [drp] via OPHTHALMIC
  Filled 2016-03-31 (×9): qty 1

## 2016-03-31 MED ORDER — VITAMIN B-12 1000 MCG PO TABS
2000.0000 ug | ORAL_TABLET | Freq: Two times a day (BID) | ORAL | Status: DC
  Administered 2016-04-01 – 2016-04-04 (×5): 2000 ug via ORAL
  Filled 2016-03-31 (×5): qty 2

## 2016-03-31 MED ORDER — PAROXETINE HCL 20 MG PO TABS
40.0000 mg | ORAL_TABLET | ORAL | Status: DC
  Administered 2016-04-02 – 2016-04-04 (×2): 40 mg via ORAL
  Filled 2016-03-31 (×2): qty 2

## 2016-03-31 MED ORDER — FLUTICASONE PROPIONATE 50 MCG/ACT NA SUSP: 1.0000 | Freq: Every day | NASAL | Status: DC

## 2016-03-31 MED ORDER — FLUTICASONE PROPIONATE HFA 110 MCG/ACT IN AERO: 1.0000 | INHALATION_SPRAY | Freq: Every day | RESPIRATORY_TRACT | Status: DC

## 2016-03-31 MED ORDER — ALBUTEROL SULFATE (2.5 MG/3ML) 0.083% IN NEBU: 2.5000 mg | INHALATION_SOLUTION | RESPIRATORY_TRACT | Status: DC | PRN

## 2016-03-31 MED ORDER — TIMOLOL MALEATE 0.5 % OP SOLG
1.0000 [drp] | Freq: Every day | OPHTHALMIC | Status: DC
Start: 2016-03-31 — End: 2016-04-02
  Administered 2016-04-01 – 2016-04-02 (×2): 1 [drp] via OPHTHALMIC
  Filled 2016-03-31: qty 5

## 2016-03-31 MED ORDER — PANTOPRAZOLE SODIUM 40 MG IV SOLR: 40.0000 mg | Freq: Two times a day (BID) | INTRAVENOUS | Status: DC

## 2016-03-31 MED ORDER — BUDESONIDE 0.25 MG/2ML IN SUSP
0.2500 mg | Freq: Two times a day (BID) | RESPIRATORY_TRACT | Status: DC
  Administered 2016-04-01 – 2016-04-04 (×7): 0.25 mg via RESPIRATORY_TRACT
  Filled 2016-03-31 (×7): qty 2

## 2016-03-31 NOTE — ED Provider Notes (Signed)
[facility] Emergency Department Provider Note  ____________________________________________   First MD Initiated Contact with Patient 03/31/16 1733     (approximate)  I have reviewed the triage vital signs and the nursing notes.   HISTORY  Chief Complaint Rectal Bleeding    HPI [name] [name] is a 83 y.o. female who comes to the emergency department with a large volume of melena in her nursing home today. She has a past medical history of chronic anemia and diverticulosis as well as gastric ulcers. She receives frequent intravenous iron infusion secondary to chronic anemia and most recently had an infusion yesterday. Today she began having diffuse aching cramping abdominal discomfort and a large amount of stool that was "dark's call". Her initial blood pressure was 70s over 40s at her nursing home and they called 911.   Past Medical History:  Diagnosis Date  . COPD (chronic obstructive pulmonary disease) ([place])   . Diverticulitis   . GERD (gastroesophageal reflux disease)   . Glaucoma   . HLD (hyperlipidemia)   . Hypertension   . Iron deficiency   . Rectal bleeding     Patient Active Problem List   Diagnosis Date Noted  . Healthcare-associated pertussis 10/10/2015  . Healthcare-associated pneumonia 10/10/2015  . GI bleed 08/10/2015  . Iron deficiency anemia due to chronic blood loss 08/10/2015  . HTN (hypertension) 08/10/2015  . HLD (hyperlipidemia) 08/10/2015  . GERD (gastroesophageal reflux disease) 08/10/2015  . COPD (chronic obstructive pulmonary disease) ([place]) 08/10/2015  . Recurrent major depressive disorder, in partial remission ([place]) 07/10/2011  . Diverticulosis of colon 07/18/2010  . Gastric ulcer 07/18/2010    Past Surgical History:  Procedure Laterality Date  . ABDOMINAL HYSTERECTOMY    . APPENDECTOMY    . ESOPHAGOGASTRODUODENOSCOPY (EGD) WITH PROPOFOL N/A 08/12/2015   Procedure: ESOPHAGOGASTRODUODENOSCOPY (EGD) WITH  PROPOFOL;  Surgeon: Lollie Sails, MD;  Location: [facility] ENDOSCOPY;  Service: Endoscopy;  Laterality: N/A;  . JOINT REPLACEMENT      Prior to Admission medications   Medication Sig Start Date End Date Taking? Authorizing Provider  acetaminophen (TYLENOL) 650 MG CR tablet Take 650 mg by mouth every 8 (eight) hours as needed for pain.    Historical Provider, MD  albuterol (PROVENTIL HFA;VENTOLIN HFA) 108 (90 Base) MCG/ACT inhaler Inhale 2 puffs into the lungs every 4 (four) hours as needed for wheezing or shortness of breath.    Historical Provider, MD  chlorthalidone (HYGROTON) 25 MG tablet Take 12.5 mg by mouth daily.  01/18/16   Historical Provider, MD  colchicine 0.6 MG tablet Take by mouth. 08/28/13   Historical Provider, MD  Cyanocobalamin (B-12) 1000 MCG CAPS Take 2,000 Units by mouth 2 (two) times daily.    Historical Provider, MD  cycloSPORINE (RESTASIS) 0.05 % ophthalmic emulsion Place 1 drop into both eyes 2 (two) times daily.    Historical Provider, MD  fluticasone (FLONASE) 50 MCG/ACT nasal spray USE TWO SPRAYS IN BOTH NOSTRILS ONCE DAILY 03/24/15   Historical Provider, MD  fluticasone (FLOVENT HFA) 110 MCG/ACT inhaler Inhale into the lungs. 01/12/16 10/18/16  Historical Provider, MD  gabapentin (NEURONTIN) 300 MG capsule Take 300 mg by mouth 2 (two) times daily.    Historical Provider, MD  guaiFENesin (MUCINEX) 600 MG 12 hr tablet Take 600 mg by mouth 2 (two) times daily as needed.    Historical Provider, MD  HYDROcodone-acetaminophen (NORCO/VICODIN) 5-325 MG tablet Take 1 tablet by mouth every 4 (four) hours as needed for moderate pain. Patient not  taking: Reported on 01/21/2016 10/13/15 10/12/16  Loletha Grayer, MD  hydrocortisone (PROCTOSOL HC) 2.5 % rectal cream PLACE RECTALLY TWICE A DAY 10/13/15   Historical Provider, MD  latanoprost (XALATAN) 0.005 % ophthalmic solution Apply to eye. 01/17/16 01/16/17  Historical Provider, MD  loratadine (CLARITIN) 10 MG tablet Take 10 mg by mouth daily  as needed for allergies.    Historical Provider, MD  losartan (COZAAR) 50 MG tablet Take 50 mg by mouth daily.    Historical Provider, MD  mometasone (ELOCON) 0.1 % lotion Apply topically daily.    Historical Provider, MD  Multiple Vitamins-Minerals (OCUVITE PRESERVISION PO) Take 1 tablet by mouth 2 (two) times daily.    Historical Provider, MD  mupirocin ointment (BACTROBAN) 2 % Place 1 application into the nose 2 (two) times daily.    Historical Provider, MD  PARoxetine (PAXIL) 40 MG tablet Take 40 mg by mouth every morning.    Historical Provider, MD  Polyethyl Glycol-Propyl Glycol (SYSTANE) 0.4-0.3 % SOLN Apply 1 drop to eye as needed.    Historical Provider, MD  potassium chloride (K-DUR,KLOR-CON) 10 MEQ tablet Take 10 mEq by mouth 2 (two) times daily.    Historical Provider, MD  RA KRILL OIL 500 MG CAPS Take by mouth.    Historical Provider, MD  RABEprazole (ACIPHEX) 20 MG tablet Take 20 mg by mouth 2 (two) times daily.    Historical Provider, MD  sucralfate (CARAFATE) 1 g tablet Take 1 tablet (1 g total) by mouth 4 (four) times daily -  with meals and at bedtime. 08/13/15   Dustin Flock, MD  Tetrahydroz-Dextran-PEG-Povid (EYE DROPS ADVANCED RELIEF OP) Apply to eye.    Historical Provider, MD  timolol (TIMOPTIC-XR) 0.5 % ophthalmic gel-forming Place 1 drop into both eyes daily.    Historical Provider, MD  valACYclovir (VALTREX) 1000 MG tablet once daily as needed. Reported on 03/22/2015 01/18/13   Historical Provider, MD  Vitamin D, Ergocalciferol, (DRISDOL) 50000 units CAPS capsule Take by mouth.    Historical Provider, MD    Allergies Codeine; Dextrans; Fentanyl; Lipitor [atorvastatin]; Mobic [meloxicam]; Niacin and related; Nsaids; Pravachol [pravastatin sodium]; Statins; Voltaren [diclofenac sodium]; and Zetia [ezetimibe]  Family History  Problem Relation Age of Onset  . CAD Mother   . CAD Father   . CAD Brother     Social History Social History  Substance Use Topics  . Smoking  status: Former Smoker    Types: Cigarettes  . Smokeless tobacco: Never Used  . Alcohol use No    Review of Systems Constitutional: No fever/chills Eyes: No visual changes. ENT: No sore throat. Cardiovascular: Denies chest pain. Respiratory: Denies shortness of breath. Gastrointestinal: Positive abdominal pain.  Positive nausea, no vomiting.  Positive diarrhea.  No constipation. Genitourinary: Negative for dysuria. Musculoskeletal: Negative for back pain. Skin: Negative for rash. Neurological: Negative for headaches, focal weakness or numbness.  10-point ROS otherwise negative.  ____________________________________________   PHYSICAL EXAM:  VITAL SIGNS: ED Triage Vitals  Enc Vitals Group     BP 03/31/16 1719 (!) 135/51     Pulse Rate 03/31/16 1719 99     Resp 03/31/16 1719 12     Temp 03/31/16 1719 97.4 F (36.3 C)     Temp Source 03/31/16 1719 Oral     SpO2 03/31/16 1719 97 %     Weight 03/31/16 1724 185 lb (83.9 kg)     Height 03/31/16 1724 5\' 9"  (1.753 m)     Head Circumference --  Peak Flow --      Pain Score --      Pain Loc --      Pain Edu? --      Excl. in [place]? --     Constitutional: Alert and oriented x 4 well appearing nontoxic no diaphoresis speaks in full, clear sentences Eyes: PERRL EOMI. Head: Atraumatic. Nose: No congestion/rhinnorhea. Mouth/Throat: No trismus Neck: No stridor.   Cardiovascular: Tachycardic rate, regular rhythm. Grossly normal heart sounds.  Good peripheral circulation. Respiratory: Normal respiratory effort.  No retractions. Lungs CTAB and moving good air Gastrointestinal: Soft nondistended mild diffuse tenderness with no focality and no peritonitis Guaiac positive control positive frank melena   Musculoskeletal: No lower extremity edema   Neurologic:  Normal speech and language. No gross focal neurologic deficits are appreciated. Skin:  Skin is warm, dry and intact. No rash noted. Psychiatric: Mood and affect are normal.  Speech and behavior are normal.    ____________________________________________   DIFFERENTIAL  Upper GI bleed, lower GI bleed, perforated viscus, anemia ____________________________________________   LABS (all labs ordered are listed, but only abnormal results are displayed)  Labs Reviewed  HEPATIC FUNCTION PANEL - Abnormal; Notable for the following:       Result Value   Bilirubin, Direct <0.1 (*)    All other components within normal limits  BASIC METABOLIC PANEL - Abnormal; Notable for the following:    Sodium 134 (*)    Chloride 98 (*)    Glucose, Bld 152 (*)    BUN 52 (*)    All other components within normal limits  CBC WITH DIFFERENTIAL/PLATELET - Abnormal; Notable for the following:    RBC 3.45 (*)    Hemoglobin 8.9 (*)    HCT 27.2 (*)    MCV 78.8 (*)    MCH 25.8 (*)    RDW 16.3 (*)    Neutro Abs 8.4 (*)    All other components within normal limits  TROPONIN I  PROTIME-INR  TYPE AND SCREEN    Slight anemia to 8.9 __________________________________________  EKG  ED ECG REPORT I, Darel Hong, the attending physician, personally viewed and interpreted this ECG.  Date: 03/31/2016 Rate: 99 Rhythm: normal sinus rhythm QRS Axis: normal Intervals: Prolonged QTC ST/T Wave abnormalities: normal Conduction Disturbances: none Narrative Interpretation: Abnormal ________________________________________  RADIOLOGY   ____________________________________________   PROCEDURES  Procedure(s) performed: no  Procedures  Critical Care performed: yes  CRITICAL CARE Performed by: Darel Hong   Total critical care time: 35 minutes  Critical care time was exclusive of separately billable procedures and treating other patients.  Critical care was necessary to treat or prevent imminent or life-threatening deterioration.  Critical care was time spent personally by me on the following activities: development of treatment plan with patient and/or  surrogate as well as nursing, discussions with consultants, evaluation of patient's response to treatment, examination of patient, obtaining history from patient or surrogate, ordering and performing treatments and interventions, ordering and review of laboratory studies, ordering and review of radiographic studies, pulse oximetry and re-evaluation of patient's condition.  ____________________________________________   INITIAL IMPRESSION / ASSESSMENT AND PLAN / ED COURSE  Pertinent labs & imaging results that were available during my care of the patient were reviewed by me and considered in my medical decision making (see chart for details).  The patient has frank melena which is concerning for upper GI bleed. She is not cirrhotic. I will give her Protonix and check broad labs including a type and screen  and touch base with GI. She will require inpatient admission.     ----------------------------------------- 6:19 PM on 03/31/2016 -----------------------------------------  The patient's hemoglobin is 8.9 and at this point she does not require transfusion. GI paged. ____________________________________________  ----------------------------------------- 6:31 PM on 03/31/2016 -----------------------------------------  I spoke with Dr. Marius Ditch who will kindly consult on the patient.   FINAL CLINICAL IMPRESSION(S) / ED DIAGNOSES  Final diagnoses:  Melena      NEW MEDICATIONS STARTED DURING THIS VISIT:  New Prescriptions   No medications on file     Note:  This document was prepared using Dragon voice recognition software and may include unintentional dictation errors.     Darel Hong, MD 03/31/16 [phone]

## 2016-03-31 NOTE — ED Triage Notes (Addendum)
Patient arrived by EMS from independent living at [facility]. Patient received a iron transfusion yesterday and started bleeding from the rectum last night around 10pm. Patient has been feeling lightheaded, cool, and clammy. Patients papers say DNR but they did not send form with patient. Denies being on blood thinners. HX chronic iron deficiency. Currently has bleeding ulcer and was scoped. Sitting b/p with [facility] 78/42 and Laying down b/p with EMS 136/61

## 2016-03-31 NOTE — H&P (Addendum)
[name] [name] at [place] NAME: [name] [name]    MR#:  [id number]  DATE OF BIRTH:  May 03, 1925  DATE OF ADMISSION:  03/31/2016  PRIMARY CARE PHYSICIAN: Gearldine Shown, DO   REQUESTING/REFERRING PHYSICIAN: Dr Darel Hong  CHIEF COMPLAINT:   Chief Complaint  Patient presents with  . Rectal Bleeding    HISTORY OF PRESENT ILLNESS:  [name] [name]  is a 83 y.o. female with a known history of Erosive gastritis and anemia receiving iron transfusions as outpatient. She received an iron transfusion yesterday. Last night started developing melena with 10 black stools starting at 10 PM last night. She states that her blood pressure dropped. She feels fine as long she is laying flat in bed. She was sweating earlier but now she is freezing. She does have nausea and dry heaves. In the ER she was found to be anemic with a hemoglobin of 8.9.  PAST MEDICAL HISTORY:   Past Medical History:  Diagnosis Date  . COPD (chronic obstructive pulmonary disease) ([place])   . Diverticulitis   . GERD (gastroesophageal reflux disease)   . Glaucoma   . HLD (hyperlipidemia)   . Hypertension   . Iron deficiency   . Rectal bleeding     PAST SURGICAL HISTORY:   Past Surgical History:  Procedure Laterality Date  . ABDOMINAL HYSTERECTOMY    . APPENDECTOMY    . ESOPHAGOGASTRODUODENOSCOPY (EGD) WITH PROPOFOL N/A 08/12/2015   Procedure: ESOPHAGOGASTRODUODENOSCOPY (EGD) WITH PROPOFOL;  Surgeon: Lollie Sails, MD;  Location: [facility] ENDOSCOPY;  Service: Endoscopy;  Laterality: N/A;  . JOINT REPLACEMENT      SOCIAL HISTORY:   Social History  Substance Use Topics  . Smoking status: Former Smoker    Types: Cigarettes  . Smokeless tobacco: Never Used  . Alcohol use No    FAMILY HISTORY:   Family History  Problem Relation Age of Onset  . CAD Mother   . CAD Father   . CAD Brother     DRUG ALLERGIES:   Allergies  Allergen Reactions  .  Codeine Nausea And Vomiting  . Dextrans Hives  . Fentanyl Itching  . Lipitor [Atorvastatin] Other (See Comments)    Reaction: Muscle pain  . Mobic [Meloxicam] Other (See Comments)    Reaction: Mouth and tongue ulcers  . Niacin And Related Itching  . Nsaids Hives  . Pravachol [Pravastatin Sodium] Other (See Comments)    Reaction: Muscle pain  . Statins   . Voltaren [Diclofenac Sodium] Other (See Comments)    Reaction: Mouth and tongue ulcers  . Zetia [Ezetimibe] Other (See Comments)    Reaction: Leg pain    REVIEW OF SYSTEMS:  CONSTITUTIONAL: No fever. Positive for sweating. No freezing cold. Positive weakness. EYES: Poor vision with macular degeneration EARS, NOSE, AND THROAT: No tinnitus or ear pain. No sore throat. Wears hearing aids. Positive for  postnasal drip. RESPIRATORY: Positive for cough, worsening with her shortness of breath shortness of breath. No wheezing or hemoptysis.  CARDIOVASCULAR: No chest pain, orthopnea, edema.  GASTROINTESTINAL: Positive for nausea and dry heaves. No vomiting. Positive for melena diarrhea. Positive for lower abdominal pain. GENITOURINARY: No dysuria, hematuria.  ENDOCRINE: No polyuria, nocturia,  HEMATOLOGY: No anemia, easy bruising or bleeding SKIN: No rash or lesion. MUSCULOSKELETAL: Positive for joint pain NEUROLOGIC: No tingling, numbness, weakness.  PSYCHIATRY: History of anxiety and depression.   MEDICATIONS AT HOME:   Prior to Admission medications   Medication Sig Start Date End Date  Taking? Authorizing Provider  acetaminophen (TYLENOL) 650 MG CR tablet Take 650 mg by mouth every 8 (eight) hours as needed for pain.   Yes Historical Provider, MD  albuterol (PROVENTIL HFA;VENTOLIN HFA) 108 (90 Base) MCG/ACT inhaler Inhale 2 puffs into the lungs every 4 (four) hours as needed for wheezing or shortness of breath.   Yes Historical Provider, MD  chlorthalidone (HYGROTON) 25 MG tablet Take 12.5 mg by mouth daily.  01/18/16  Yes Historical  Provider, MD  colchicine 0.6 MG tablet Take 0.6 mg by mouth daily.  08/28/13  Yes Historical Provider, MD  gabapentin (NEURONTIN) 300 MG capsule Take 300 mg by mouth 2 (two) times daily.   Yes Historical Provider, MD  guaiFENesin (MUCINEX) 600 MG 12 hr tablet Take 600 mg by mouth 2 (two) times daily as needed.   Yes Historical Provider, MD  hydrocortisone (PROCTOSOL HC) 2.5 % rectal cream PLACE RECTALLY TWICE A DAY 10/13/15  Yes Historical Provider, MD  latanoprost (XALATAN) 0.005 % ophthalmic solution Apply 1 drop to eye at bedtime.  01/17/16 01/16/17 Yes Historical Provider, MD  loratadine (CLARITIN) 10 MG tablet Take 10 mg by mouth daily as needed for allergies.   Yes Historical Provider, MD  losartan (COZAAR) 50 MG tablet Take 50 mg by mouth daily.   Yes Historical Provider, MD  Multiple Vitamins-Minerals (OCUVITE PRESERVISION PO) Take 1 tablet by mouth 2 (two) times daily.   Yes Historical Provider, MD  omeprazole (PRILOSEC) 20 MG capsule Take 20 mg by mouth 2 (two) times daily before a meal.   Yes Historical Provider, MD  PARoxetine (PAXIL) 40 MG tablet Take 40 mg by mouth every morning.   Yes Historical Provider, MD  Polyethyl Glycol-Propyl Glycol (SYSTANE) 0.4-0.3 % SOLN Apply 1 drop to eye as needed.   Yes Historical Provider, MD  potassium chloride (K-DUR,KLOR-CON) 10 MEQ tablet Take 10 mEq by mouth 2 (two) times daily.   Yes Historical Provider, MD  Tetrahydroz-Dextran-PEG-Povid (EYE DROPS ADVANCED RELIEF OP) Apply to eye.   Yes Historical Provider, MD  timolol (TIMOPTIC-XR) 0.5 % ophthalmic gel-forming Place 1 drop into both eyes daily.   Yes Historical Provider, MD  valACYclovir (VALTREX) 1000 MG tablet once daily as needed. Reported on 03/22/2015 01/18/13  Yes Historical Provider, MD  Cyanocobalamin (B-12) 1000 MCG CAPS Take 2,000 Units by mouth 2 (two) times daily.    Historical Provider, MD  cycloSPORINE (RESTASIS) 0.05 % ophthalmic emulsion Place 1 drop into both eyes 2 (two) times daily.     Historical Provider, MD  fluticasone (FLONASE) 50 MCG/ACT nasal spray USE TWO SPRAYS IN BOTH NOSTRILS ONCE DAILY 03/24/15   Historical Provider, MD  fluticasone (FLOVENT HFA) 110 MCG/ACT inhaler Inhale 1 puff into the lungs daily.  01/12/16 10/18/16  Historical Provider, MD  HYDROcodone-acetaminophen (NORCO/VICODIN) 5-325 MG tablet Take 1 tablet by mouth every 4 (four) hours as needed for moderate pain. Patient not taking: Reported on 01/21/2016 10/13/15 10/12/16  Loletha Grayer, MD  mometasone (ELOCON) 0.1 % lotion Apply topically daily.    Historical Provider, MD  mupirocin ointment (BACTROBAN) 2 % Place 1 application into the nose 2 (two) times daily.    Historical Provider, MD  RA KRILL OIL 500 MG CAPS Take by mouth.    Historical Provider, MD  RABEprazole (ACIPHEX) 20 MG tablet Take 20 mg by mouth 2 (two) times daily.    Historical Provider, MD  sucralfate (CARAFATE) 1 g tablet Take 1 tablet (1 g total) by mouth 4 (four) times daily -  with meals and at bedtime. Patient not taking: Reported on 03/31/2016 08/13/15   Dustin Flock, MD  Vitamin D, Ergocalciferol, (DRISDOL) 50000 units CAPS capsule Take by mouth.    Historical Provider, MD      VITAL SIGNS:  Blood pressure (!) 135/51, pulse 99, temperature 97.4 F (36.3 C), temperature source Oral, resp. rate 12, height 5\' 9"  (1.753 m), weight 83.9 kg (185 lb), SpO2 97 %.  PHYSICAL EXAMINATION:  GENERAL:  83 y.o.-year-old patient lying in the bed with no acute distress.  EYES: Pupils equal, round, reactive to light and accommodation. No scleral icterus. Extraocular muscles intact.  HEENT: Head atraumatic, normocephalic. Oropharynx and nasopharynx clear.  NECK:  Supple, no jugular venous distention. No thyroid enlargement, no tenderness.  LUNGS: Normal breath sounds bilaterally, no wheezing, rales,rhonchi or crepitation. No use of accessory muscles of respiration.  CARDIOVASCULAR: S1, S2 normal. No murmurs, rubs, or gallops.  ABDOMEN: Soft,  nontender, nondistended. Bowel sounds present. No organomegaly or mass.  EXTREMITIES: No pedal edema, cyanosis, or clubbing.  NEUROLOGIC: Cranial nerves II through XII are intact. Muscle strength 5/5 in all extremities. Sensation intact. Gait not checked.  PSYCHIATRIC: The patient is alert and oriented x 3.  SKIN: No rash, lesion, or ulcer.   LABORATORY PANEL:   CBC  Recent Labs Lab 03/31/16 1719  WBC 10.6  HGB 8.9*  HCT 27.2*  PLT 378   ------------------------------------------------------------------------------------------------------------------  Chemistries   Recent Labs Lab 03/31/16 1719  NA 134*  K 3.5  CL 98*  CO2 26  GLUCOSE 152*  BUN 52*  CREATININE 0.65  CALCIUM 8.9  AST 26  ALT 17  ALKPHOS 60  BILITOT 0.6   ------------------------------------------------------------------------------------------------------------------  Cardiac Enzymes  Recent Labs Lab 03/31/16 1719  TROPONINI <0.03   ------------------------------------------------------------------------------------------------------------------    EKG:   Ordered by me  IMPRESSION AND PLAN:   1. Upper GI bleed with melena. Acute blood loss anemia. The patient does have a history of nonbleeding erosive gastropathy in the last endoscopy. Patient will be given Protonix bolus and drip. Patient was consented for blood if needed. Serial hemoglobins. Clear liquid diet this evening and nothing by mouth after midnight. ER physician spoke with on-call gastroenterology. Last ferritin was low at 7. 2. Essential hypertension. Hold [place] and continue Cozaar blood pressure allows 3. Anxiety depression on Paxil 4. Glaucoma unspecified on quite a few eyedrops 5. Neuropathy on gabapentin 6. COPD. Respiratory status stable. Continue inhalers.   All the records are reviewed and case discussed with ED provider. Management plans discussed with the patient, family and they are in agreement.  CODE  STATUS: Full code. Patient would not want to be on a ventilator long-term.  TOTAL TIME TAKING CARE OF THIS PATIENT: 50 minutes.    Loletha Grayer M.D on 03/31/2016 at 7:32 PM  Between 7am to 6pm - Pager - [phone]  After 6pm call admission pager [phone]  [facility]  [phone]  CC: Primary care physician; Arrie Aran PATRICIA, DO

## 2016-03-31 NOTE — ED Notes (Signed)
Called to give report x1, the RN is busy in another patient room at this time and will call me back.

## 2016-04-01 ENCOUNTER — Encounter: Payer: Self-pay | Admitting: *Deleted

## 2016-04-01 ENCOUNTER — Inpatient Hospital Stay: Payer: Medicare Other | Admitting: Anesthesiology

## 2016-04-01 LAB — CBC
HEMATOCRIT: 22.6 % — AB (ref 35.0–47.0)
HEMOGLOBIN: 7.5 g/dL — AB (ref 12.0–16.0)
MCH: 26.1 pg (ref 26.0–34.0)
MCHC: 33 g/dL (ref 32.0–36.0)
MCV: 79.1 fL — AB (ref 80.0–100.0)
Platelets: 328 10*3/uL (ref 150–440)
RBC: 2.86 MIL/uL — AB (ref 3.80–5.20)
RDW: 16.4 % — ABNORMAL HIGH (ref 11.5–14.5)
WBC: 10.7 10*3/uL (ref 3.6–11.0)

## 2016-04-01 LAB — BASIC METABOLIC PANEL
ANION GAP: 10 (ref 5–15)
BUN: 45 mg/dL — ABNORMAL HIGH (ref 6–20)
CHLORIDE: 104 mmol/L (ref 101–111)
CO2: 26 mmol/L (ref 22–32)
Calcium: 8.5 mg/dL — ABNORMAL LOW (ref 8.9–10.3)
Creatinine, Ser: 0.56 mg/dL (ref 0.44–1.00)
GFR calc non Af Amer: >60 mL/min (ref >60)
Glucose, Bld: 103 mg/dL — ABNORMAL HIGH (ref 65–99)
POTASSIUM: 2.8 mmol/L — AB (ref 3.5–5.1)
Sodium: 140 mmol/L (ref 135–145)

## 2016-04-01 LAB — HEMOGLOBIN
Hemoglobin: 7.5 g/dL — ABNORMAL LOW (ref 12.0–16.0)
Hemoglobin: 7.4 g/dL — ABNORMAL LOW (ref 12.0–16.0)
Hemoglobin: 6.6 g/dL — ABNORMAL LOW (ref 12.0–16.0)

## 2016-04-01 LAB — POTASSIUM: POTASSIUM: 2.5 mmol/L — AB (ref 3.5–5.1)

## 2016-04-01 LAB — MRSA PCR SCREENING: MRSA by PCR: NEGATIVE

## 2016-04-01 MED ORDER — SODIUM CHLORIDE 0.9 % IV SOLN: INTRAVENOUS | Status: DC

## 2016-04-01 MED ORDER — POTASSIUM CHLORIDE CRYS ER 20 MEQ PO TBCR
20.0000 meq | EXTENDED_RELEASE_TABLET | Freq: Two times a day (BID) | ORAL | Status: DC
  Administered 2016-04-01 – 2016-04-02 (×2): 20 meq via ORAL
  Filled 2016-04-01 (×2): qty 1

## 2016-04-01 MED ORDER — PROPOFOL 10 MG/ML IV BOLUS
INTRAVENOUS | Status: AC
  Filled 2016-04-01: qty 20

## 2016-04-01 NOTE — Progress Notes (Signed)
Copy made

## 2016-04-01 NOTE — Consult Note (Signed)
Consultation  Referring Provider:     No ref. provider found Primary Care Physician:  Gearldine Shown, DO Primary Gastroenterologist: Dr Gustavo Lah      Reason for Consultation:     Melena, anemia  Date of Admission:  03/31/2016 Date of Consultation:  04/01/2016         HPI:   [name] [name] is a 83 y.o. female with PMH as listed below presented to ER yesterday after having multiple episodes of black tarry stools. She had iron infusion yesterday and later started having lower abdominal cramps and black tarry stools, approximately 10 episodes, felt weak, was found to have low BP checked by her nurse. In the ER, she was found to be anemic with hb of 8.9, dropped from 9.7 on 3/12. She is closely followed by hematology as out pt and gets iron infusions periodically. She has chronic iron def anemia, last admitted with melena and symptomatic anemia in 08/2015. At that time she underwent EGD which showed gastric erosions only. She reports that her bowel movements are usually brown and formed, 1/day. She denies taking NSAIDs, ETOH. Since admission, her Hb further dropped to 7.5 today, Her BUN/Cr ratio is elevated. Her last BM was yesterday. She is started on PPI drip and kept NPO.   She has known diverticulosis and last colonoscopy was several years ago, had a CT colonography due to incomplete colonoscopy from severe diverticulosis per pt's report. She denies having any upper GI symptoms.  Her son-in-law was bedside at the time of my consultation visit.    Past Medical History:  Diagnosis Date  . COPD (chronic obstructive pulmonary disease) ([place])   . Diverticulitis   . GERD (gastroesophageal reflux disease)   . Glaucoma   . HLD (hyperlipidemia)   . Hypertension   . Iron deficiency   . Rectal bleeding     Past Surgical History:  Procedure Laterality Date  . ABDOMINAL HYSTERECTOMY    . APPENDECTOMY    . ESOPHAGOGASTRODUODENOSCOPY (EGD) WITH PROPOFOL N/A 08/12/2015   Procedure:  ESOPHAGOGASTRODUODENOSCOPY (EGD) WITH PROPOFOL;  Surgeon: Lollie Sails, MD;  Location: [facility] ENDOSCOPY;  Service: Endoscopy;  Laterality: N/A;  . JOINT REPLACEMENT      Prior to Admission medications   Medication Sig Start Date End Date Taking? Authorizing Provider  acetaminophen (TYLENOL) 650 MG CR tablet Take 650 mg by mouth every 8 (eight) hours as needed for pain.   Yes Historical Provider, MD  albuterol (PROVENTIL HFA;VENTOLIN HFA) 108 (90 Base) MCG/ACT inhaler Inhale 2 puffs into the lungs every 4 (four) hours as needed for wheezing or shortness of breath.   Yes Historical Provider, MD  chlorthalidone (HYGROTON) 25 MG tablet Take 12.5 mg by mouth daily.  01/18/16  Yes Historical Provider, MD  Cholecalciferol (VITAMIN D PO) Take 1 tablet by mouth daily.   Yes Historical Provider, MD  COENZYME Q10 PO Take 1 tablet by mouth daily.   Yes Historical Provider, MD  colchicine 0.6 MG tablet Take 0.6 mg by mouth daily.  08/28/13  Yes Historical Provider, MD  Cyanocobalamin (B-12) 1000 MCG CAPS Take 2,000 Units by mouth 2 (two) times daily.   Yes Historical Provider, MD  cycloSPORINE (RESTASIS) 0.05 % ophthalmic emulsion Place 1 drop into both eyes 2 (two) times daily.   Yes Historical Provider, MD  fluticasone (FLOVENT HFA) 110 MCG/ACT inhaler Inhale 1 puff into the lungs 2 (two) times daily.  01/12/16 10/18/16 Yes Historical Provider, MD  gabapentin (NEURONTIN) 300 MG capsule Take  300 mg by mouth 2 (two) times daily.   Yes Historical Provider, MD  guaiFENesin (MUCINEX) 600 MG 12 hr tablet Take 600 mg by mouth 2 (two) times daily as needed.   Yes Historical Provider, MD  hydrochlorothiazide (HYDRODIURIL) 12.5 MG tablet Take 12.5 mg by mouth daily.   Yes Historical Provider, MD  hydrocortisone (PROCTOSOL HC) 2.5 % rectal cream PLACE RECTALLY TWICE A DAY 10/13/15  Yes Historical Provider, MD  latanoprost (XALATAN) 0.005 % ophthalmic solution Apply 1 drop to eye at bedtime.  01/17/16 01/16/17 Yes Historical  Provider, MD  loratadine (CLARITIN) 10 MG tablet Take 10 mg by mouth daily as needed for allergies.   Yes Historical Provider, MD  losartan (COZAAR) 50 MG tablet Take 50 mg by mouth daily.   Yes Historical Provider, MD  Multiple Vitamin (MULTIVITAMIN) tablet Take 1 tablet by mouth daily.   Yes Historical Provider, MD  Multiple Vitamins-Minerals (OCUVITE PRESERVISION PO) Take 1 tablet by mouth 2 (two) times daily.   Yes Historical Provider, MD  omeprazole (PRILOSEC) 20 MG capsule Take 20 mg by mouth 2 (two) times daily before a meal.   Yes Historical Provider, MD  PARoxetine (PAXIL) 40 MG tablet Take 40 mg by mouth every morning.   Yes Historical Provider, MD  Polyethyl Glycol-Propyl Glycol (SYSTANE) 0.4-0.3 % SOLN Apply 1 drop to eye as needed.   Yes Historical Provider, MD  potassium chloride (K-DUR,KLOR-CON) 10 MEQ tablet Take 10 mEq by mouth 2 (two) times daily.   Yes Historical Provider, MD  RABEprazole (ACIPHEX) 20 MG tablet Take 20 mg by mouth 2 (two) times daily.   Yes Historical Provider, MD  saccharomyces boulardii (FLORASTOR) 250 MG capsule Take 250 mg by mouth daily.   Yes Historical Provider, MD  sucralfate (CARAFATE) 1 g tablet Take 1 tablet (1 g total) by mouth 4 (four) times daily -  with meals and at bedtime. 08/13/15  Yes Dustin Flock, MD  Tetrahydroz-Dextran-PEG-Povid (EYE DROPS ADVANCED RELIEF OP) Apply to eye.   Yes Historical Provider, MD  timolol (TIMOPTIC-XR) 0.5 % ophthalmic gel-forming Place 1 drop into both eyes daily.   Yes Historical Provider, MD  valACYclovir (VALTREX) 1000 MG tablet once daily as needed. Reported on 03/22/2015 01/18/13  Yes Historical Provider, MD  fluticasone (FLONASE) 50 MCG/ACT nasal spray USE TWO SPRAYS IN BOTH NOSTRILS ONCE DAILY 03/24/15   Historical Provider, MD  HYDROcodone-acetaminophen (NORCO/VICODIN) 5-325 MG tablet Take 1 tablet by mouth every 4 (four) hours as needed for moderate pain. Patient not taking: Reported on 01/21/2016 10/13/15 10/12/16   Loletha Grayer, MD  mometasone (ELOCON) 0.1 % lotion Apply topically daily.    Historical Provider, MD  mupirocin ointment (BACTROBAN) 2 % Place 1 application into the nose 2 (two) times daily.    Historical Provider, MD  RA KRILL OIL 500 MG CAPS Take 1 capsule by mouth.     Historical Provider, MD  Vitamin D, Ergocalciferol, (DRISDOL) 50000 units CAPS capsule Take by mouth.    Historical Provider, MD    Family History  Problem Relation Age of Onset  . CAD Mother   . CAD Father   . CAD Brother      Social History  Substance Use Topics  . Smoking status: Former Smoker    Types: Cigarettes  . Smokeless tobacco: Never Used  . Alcohol use No    Allergies as of 03/31/2016 - Review Complete 03/31/2016  Allergen Reaction Noted  . Codeine Nausea And Vomiting 08/10/2015  . Dextrans Hives  08/10/2015  . Fentanyl Itching 08/10/2015  . Lipitor [atorvastatin] Other (See Comments) 08/10/2015  . Mobic [meloxicam] Other (See Comments) 08/10/2015  . Niacin and related Itching 08/10/2015  . Nsaids Hives 08/10/2015  . Pravachol [pravastatin sodium] Other (See Comments) 08/10/2015  . Statins  03/31/2016  . Voltaren [diclofenac sodium] Other (See Comments) 08/10/2015  . Zetia [ezetimibe] Other (See Comments) 08/10/2015    Review of Systems:    All systems reviewed and negative except where noted in HPI.   Physical Exam:  Vital signs in last 24 hours: Temp:  [97.4 F (36.3 C)-98.3 F (36.8 C)] 98.1 F (36.7 C) (03/24 1215) Pulse Rate:  [96-111] 111 (03/24 1215) Resp:  [12-22] 18 (03/24 1215) BP: (128-157)/(50-64) 128/61 (03/24 1215) SpO2:  [93 %-100 %] 100 % (03/24 1215) Weight:  [83.9 kg (185 lb)] 83.9 kg (185 lb) (03/23 1724) Last BM Date: 03/31/16 General:   Pleasant, cooperative in NAD Head:  Normocephalic and atraumatic. Eyes:   No icterus.   Conjunctiva pink. PERRLA. Ears:  Normal auditory acuity. Neck:  Supple; no masses or thyroidomegaly Lungs: Respirations even and  unlabored. Lungs clear to auscultation bilaterally.   No wheezes, crackles, or rhonchi.  Heart:  Regular rate and rhythm;  Without murmur, clicks, rubs or gallops Abdomen:  Soft, nondistended, nontender. Normal bowel sounds. No appreciable masses or hepatomegaly.  No rebound or guarding.  Rectal:  Not performed. Msk:  Symmetrical without gross deformities. Generalized weakness  Extremities:  Without edema, cyanosis or clubbing. Neurologic:  Alert and oriented x3;  grossly normal neurologically. Skin:  Intact without significant lesions or rashes. Cervical Nodes:  No significant cervical adenopathy. Psych:  Alert and cooperative. Normal affect.  LAB RESULTS:  Recent Labs  03/31/16 1719 04/01/16 0320 04/01/16 1048  WBC 10.6 10.7  --   HGB 8.9* 7.5* 7.5*  HCT 27.2* 22.6*  --   PLT 378 328  --    BMET  Recent Labs  03/31/16 1719 04/01/16 0320  NA 134* 140  K 3.5 2.8*  CL 98* 104  CO2 26 26  GLUCOSE 152* 103*  BUN 52* 45*  CREATININE 0.65 0.56  CALCIUM 8.9 8.5*   LFT  Recent Labs  03/31/16 1719  PROT 6.9  ALBUMIN 3.7  AST 26  ALT 17  ALKPHOS 60  BILITOT 0.6  BILIDIR <0.1*  IBILI NOT CALCULATED   PT/INR  Recent Labs  03/31/16 1719  LABPROT 13.0  INR 0.98    STUDIES: No results found.    Impression / Plan:   Madeline Pho is a 83 y.o. y/o female with chronic iron deficiency anemia secondary to GI blood loss, now presents with recurrent GI bleed and severe symptomatic anemia. DDs include PUD or AVMs or Dieulafoy's or proximal colonic diverticular bleed or colonic malignancy. EGD in 08/2015 showed gastric erosions. Unknown H Pylori status. No evidence of coagulopathy or cirrhosis  - Plan for EGD tomorrow at [place] q6-8hr and transfuse if Hb < 7 - Continue PPI drip - She has low potassium today which precluded [place] from doing EGD today - Replace potassium, goal > 3.5 - Ok for CLD tonight, NPO after midnight  Thank you for involving me in the  care of this patient.      LOS: 1 day   Sherri Sear, MD  04/01/2016, 4:25 PM

## 2016-04-01 NOTE — Progress Notes (Signed)
[place] [facility] [facility] [place] NAME: [name] [name]    MR#:  [id number]  DATE OF BIRTH:  [birth year]  SUBJECTIVE:   Patient here due to weakness and melanotic stools. Still having melanotic stools today. Hemoglobin down to 7.5 this morning. Awaiting further gastroenterology input.  REVIEW OF SYSTEMS:    Review of Systems  Constitutional: Negative for chills and fever.  HENT: Negative for congestion and tinnitus.   Eyes: Negative for blurred vision and double vision.  Respiratory: Negative for cough, shortness of breath and wheezing.   Cardiovascular: Negative for chest pain, orthopnea and PND.  Gastrointestinal: Positive for melena. Negative for abdominal pain, diarrhea, nausea and vomiting.  Genitourinary: Negative for dysuria and hematuria.  Neurological: Positive for weakness. Negative for dizziness, sensory change and focal weakness.  All other systems reviewed and are negative.   Nutrition: NPO Tolerating Diet: NO Tolerating PT: Ambulatory  DRUG ALLERGIES:   Allergies  Allergen Reactions  . Codeine Nausea And Vomiting  . Dextrans Hives  . Fentanyl Itching  . Lipitor [Atorvastatin] Other (See Comments)    Reaction: Muscle pain  . Mobic [Meloxicam] Other (See Comments)    Reaction: Mouth and tongue ulcers  . Niacin And Related Itching  . Nsaids Hives  . Pravachol [Pravastatin Sodium] Other (See Comments)    Reaction: Muscle pain  . Statins   . Voltaren [Diclofenac Sodium] Other (See Comments)    Reaction: Mouth and tongue ulcers  . Zetia [Ezetimibe] Other (See Comments)    Reaction: Leg pain    VITALS:  Blood pressure 128/61, pulse (!) 111, temperature 98.1 F (36.7 C), temperature source Oral, resp. rate 18, height 5\' 9"  (1.753 m), weight 83.9 kg (185 lb), SpO2 100 %.  PHYSICAL EXAMINATION:   Physical Exam  GENERAL:  83 y.o.-year-old patient lying in bed in no acute distress.  EYES: Pupils equal, round, reactive to light  and accommodation. No scleral icterus. Extraocular muscles intact.  HEENT: Head atraumatic, normocephalic. Oropharynx and nasopharynx clear.  NECK:  Supple, no jugular venous distention. No thyroid enlargement, no tenderness.  LUNGS: Normal breath sounds bilaterally, no wheezing, rales, rhonchi. No use of accessory muscles of respiration.  CARDIOVASCULAR: S1, S2 normal. II/VI SEM at LSB, No rubs, or gallops.  ABDOMEN: Soft, nontender, nondistended. Bowel sounds present. No organomegaly or mass.  EXTREMITIES: No cyanosis, clubbing or edema b/l.    NEUROLOGIC: Cranial nerves II through XII are intact. No focal Motor or sensory deficits b/l.   PSYCHIATRIC: The patient is alert and oriented x 3.  SKIN: No obvious rash, lesion, or ulcer.    LABORATORY PANEL:   CBC  Recent Labs Lab 04/01/16 0320 04/01/16 1048  WBC 10.7  --   HGB 7.5* 7.5*  HCT 22.6*  --   PLT 328  --    ------------------------------------------------------------------------------------------------------------------  Chemistries   Recent Labs Lab 03/31/16 1719 04/01/16 0320  NA 134* 140  K 3.5 2.8*  CL 98* 104  CO2 26 26  GLUCOSE 152* 103*  BUN 52* 45*  CREATININE 0.65 0.56  CALCIUM 8.9 8.5*  AST 26  --   ALT 17  --   ALKPHOS 60  --   BILITOT 0.6  --    ------------------------------------------------------------------------------------------------------------------  Cardiac Enzymes  Recent Labs Lab 03/31/16 1719  TROPONINI <0.03   ------------------------------------------------------------------------------------------------------------------  RADIOLOGY:  No results found.   ASSESSMENT AND PLAN:   83 year old female with history of hypertension, hyperlipidemia, chronic iron deficiency anemia, COPD, GERD who  presents to the hospital due to melanotic stools.  1. GI bleed-suspected to be upper GI bleed given the patient's melanotic stools. -Hemoglobin stable at 7.5. No acute need for  transfusion presently. Will follow serial hemoglobin. -Await gastroenterology input, continue Protonix drip for now.  2. Chronic iron deficiency anemia-patient follows up with the [facility] and gets iron infusions. - pt. Got an iron infusion last week.   3. HTN - cont. Losartan  4. Anxiety - cont. Paxil.   5. Hx of Gout - no acute attack cont. Colchicine.   6. Neuropathy - cont. Neurontin.   7. Glaucoma - cont. Timolol.      All the records are reviewed and case discussed with Care Management/Social Worker. Management plans discussed with the patient, family and they are in agreement.  CODE STATUS: Limited Code  DVT Prophylaxis: Ted's & SCD's.   TOTAL TIME TAKING CARE OF THIS PATIENT: 30 minutes.   POSSIBLE D/C IN 1-2 DAYS, DEPENDING ON CLINICAL CONDITION.   Henreitta Leber M.D on 04/01/2016 at 1:40 PM  Between 7am to 6pm - Pager - [phone]  After 6pm go to [email or web address] - Technical brewer [facility]  Office  [phone]  CC: Primary care physician; [name] Aran PATRICIA, DO

## 2016-04-02 ENCOUNTER — Encounter
Admission: EM | Disposition: A | Discharge status: Home / Self Care | Payer: Self-pay | Source: Home / Self Care | Attending: Specialist

## 2016-04-02 LAB — BASIC METABOLIC PANEL
Anion gap: 6 (ref 5–15)
BUN: 25 mg/dL — ABNORMAL HIGH (ref 6–20)
CO2: 27 mmol/L (ref 22–32)
Calcium: 8.4 mg/dL — ABNORMAL LOW (ref 8.9–10.3)
Chloride: 104 mmol/L (ref 101–111)
Creatinine, Ser: 0.55 mg/dL (ref 0.44–1.00)
GFR calc Af Amer: >60 mL/min (ref >60)
GFR calc non Af Amer: >60 mL/min (ref >60)
Glucose, Bld: 94 mg/dL (ref 65–99)
Potassium: 2.6 mmol/L — CL (ref 3.5–5.1)
Sodium: 137 mmol/L (ref 135–145)

## 2016-04-02 LAB — CBC
HCT: 21.3 % — ABNORMAL LOW (ref 35.0–47.0)
Hemoglobin: 7 g/dL — ABNORMAL LOW (ref 12.0–16.0)
MCH: 26.6 pg (ref 26.0–34.0)
MCHC: 32.8 g/dL (ref 32.0–36.0)
MCV: 81 fL (ref 80.0–100.0)
Platelets: 280 10*3/uL (ref 150–440)
RBC: 2.63 MIL/uL — ABNORMAL LOW (ref 3.80–5.20)
RDW: 20.4 % — ABNORMAL HIGH (ref 11.5–14.5)
WBC: 10.7 10*3/uL (ref 3.6–11.0)

## 2016-04-02 LAB — HEMOGLOBIN AND HEMATOCRIT, BLOOD
HCT: 24.5 % — ABNORMAL LOW (ref 35.0–47.0)
Hemoglobin: 8.1 g/dL — ABNORMAL LOW (ref 12.0–16.0)

## 2016-04-02 LAB — MAGNESIUM: Magnesium: 2.1 mg/dL (ref 1.7–2.4)

## 2016-04-02 LAB — PREPARE RBC (CROSSMATCH)

## 2016-04-02 SURGERY — ESOPHAGOGASTRODUODENOSCOPY (EGD) WITH PROPOFOL
Anesthesia: General

## 2016-04-02 MED ORDER — TIMOLOL MALEATE 0.5 % OP SOLG
1.0000 [drp] | Freq: Every day | OPHTHALMIC | Status: DC
Start: 2016-04-03 — End: 2016-04-04
  Administered 2016-04-03: 1 [drp] via OPHTHALMIC
  Filled 2016-04-02: qty 5

## 2016-04-02 MED ORDER — LATANOPROST 0.005 % OP SOLN
1.0000 [drp] | Freq: Every morning | OPHTHALMIC | Status: DC
  Administered 2016-04-03 – 2016-04-04 (×2): 1 [drp] via OPHTHALMIC
  Filled 2016-04-02: qty 2.5

## 2016-04-02 MED ORDER — SODIUM CHLORIDE 0.9 % IV SOLN
30.0000 meq | INTRAVENOUS | Status: DC
  Filled 2016-04-02 (×3): qty 15

## 2016-04-02 MED ORDER — LATANOPROST 0.005 % OP SOLN: 1.0000 [drp] | Freq: Every morning | OPHTHALMIC | Status: DC

## 2016-04-02 MED ORDER — POTASSIUM CHLORIDE CRYS ER 20 MEQ PO TBCR
60.0000 meq | EXTENDED_RELEASE_TABLET | Freq: Once | ORAL | Status: AC
  Administered 2016-04-02: 60 meq via ORAL
  Filled 2016-04-02: qty 3

## 2016-04-02 NOTE — Consult Note (Signed)
    Subjective: Had 1 episode of melena, Hb slightly dropped. Potassium still low and receiving 1 u PRBCs today Denies any complaints   Objective: Vital signs in last 24 hours: Vitals:   04/02/16 0600 04/02/16 0733 04/02/16 1050 04/02/16 1137  BP: (!) 147/58  (!) 150/56 131/69  Pulse: 90  90 86  Resp: 18  16   Temp: 98.6 F (37 C)  98 F (36.7 C) 98.6 F (37 C)  TempSrc: Oral  Oral   SpO2: 93% 96% 96% 98%  Weight:      Height:       Weight change:   Intake/Output Summary (Last 24 hours) at 04/02/16 1359 Last data filed at 04/01/16 2200  Gross per 24 hour  Intake           298.86 ml  Output                0 ml  Net           298.86 ml     Exam: Heart:: Regular rate and rhythm Lungs: clear to auscultation Abdomen: soft, nontender, normal bowel sounds   Lab Results: @LABTEST2 @ Micro Results: Recent Results (from the past 240 hour(s))  MRSA PCR Screening     Status: None   Collection Time: 03/31/16 10:05 PM  Result Value Ref Range Status   MRSA by PCR NEGATIVE NEGATIVE Final    Comment:        The GeneXpert MRSA Assay (FDA approved for NASAL specimens only), is one component of a comprehensive MRSA colonization surveillance program. It is not intended to diagnose MRSA infection nor to guide or monitor treatment for MRSA infections.    Studies/Results: No results found. Medications: I have reviewed the patient's current medications. Scheduled Meds: . budesonide (PULMICORT) nebulizer solution  0.25 mg Nebulization BID  . colchicine  0.6 mg Oral Daily  . cycloSPORINE  1 drop Both Eyes BID  . gabapentin  300 mg Oral BID  . latanoprost  1 drop Both Eyes QHS  . losartan  50 mg Oral Daily  . PARoxetine  40 mg Oral BH-q7a  . potassium chloride  20 mEq Oral BID  . timolol  1 drop Both Eyes Daily  . vitamin B-12  2,000 mcg Oral BID   Continuous Infusions: . pantoprozole (PROTONIX) infusion 8 mg/hr (04/02/16 0848)   PRN Meds:.acetaminophen **OR**  acetaminophen, albuterol, loratadine, polyvinyl alcohol   Assessment: Active Problems:   GI bleed    Plan: - Unable to perform EGD today due to low Hb and potassium - Transfuse 1 u PRBCs, goal > 7 - Replete potassium, goal > 3.5 - Ok for CLD - NPO past midnight - EGD by Dr Allen Norris tomorrow   LOS: 2 days   Tannor Pyon 04/02/2016, 1:59 PM

## 2016-04-02 NOTE — Progress Notes (Signed)
Notified Dr. Marcille Blanco about potassium level of 2.6. MD to place orders.

## 2016-04-02 NOTE — Progress Notes (Signed)
Notified Dr. Marcille Blanco of pts Hgb- 6.6. No new orders at this time.

## 2016-04-02 NOTE — Progress Notes (Signed)
[place] [facility] [place] NAME: [name] [name]    MR#:  [id number]  DATE OF BIRTH:  05-26-1925  SUBJECTIVE:   Hg. Down to 7 today.  Still having some melanotic stools. Potassium level still low.  No other complaints.    REVIEW OF SYSTEMS:    Review of Systems  Constitutional: Negative for chills and fever.  HENT: Negative for congestion and tinnitus.   Eyes: Negative for blurred vision and double vision.  Respiratory: Negative for cough, shortness of breath and wheezing.   Cardiovascular: Negative for chest pain, orthopnea and PND.  Gastrointestinal: Positive for melena. Negative for abdominal pain, diarrhea, nausea and vomiting.  Genitourinary: Negative for dysuria and hematuria.  Neurological: Positive for weakness. Negative for dizziness, sensory change and focal weakness.  All other systems reviewed and are negative.   Nutrition: NPO Tolerating Diet: NO Tolerating PT: Ambulatory  DRUG ALLERGIES:   Allergies  Allergen Reactions  . Codeine Nausea And Vomiting  . Dextrans Hives  . Fentanyl Itching  . Lipitor [Atorvastatin] Other (See Comments)    Reaction: Muscle pain  . Mobic [Meloxicam] Other (See Comments)    Reaction: Mouth and tongue ulcers  . Niacin And Related Itching  . Nsaids Hives  . Pravachol [Pravastatin Sodium] Other (See Comments)    Reaction: Muscle pain  . Statins   . Voltaren [Diclofenac Sodium] Other (See Comments)    Reaction: Mouth and tongue ulcers  . Zetia [Ezetimibe] Other (See Comments)    Reaction: Leg pain    VITALS:  Blood pressure 131/69, pulse 86, temperature 98.6 F (37 C), resp. rate 16, height 5\' 9"  (1.753 m), weight 83.9 kg (185 lb), SpO2 98 %.  PHYSICAL EXAMINATION:   Physical Exam  GENERAL:  83 y.o.-year-old patient lying in bed in no acute distress.  EYES: Pupils equal, round, reactive to light and accommodation. No scleral icterus. Extraocular muscles intact.  HEENT: Head atraumatic,  normocephalic. Oropharynx and nasopharynx clear.  NECK:  Supple, no jugular venous distention. No thyroid enlargement, no tenderness.  LUNGS: Normal breath sounds bilaterally, no wheezing, rales, rhonchi. No use of accessory muscles of respiration.  CARDIOVASCULAR: S1, S2 normal. II/VI SEM at LSB, No rubs, or gallops.  ABDOMEN: Soft, nontender, nondistended. Bowel sounds present. No organomegaly or mass.  EXTREMITIES: No cyanosis, clubbing or edema b/l.    NEUROLOGIC: Cranial nerves II through XII are intact. No focal Motor or sensory deficits b/l.   PSYCHIATRIC: The patient is alert and oriented x 3.  SKIN: No obvious rash, lesion, or ulcer.    LABORATORY PANEL:   CBC  Recent Labs Lab 04/02/16 0524  WBC 10.7  HGB 7.0*  HCT 21.3*  PLT 280   ------------------------------------------------------------------------------------------------------------------  Chemistries   Recent Labs Lab 03/31/16 1719  04/02/16 0524  NA 134*  < > 137  K 3.5  < > 2.6*  CL 98*  < > 104  CO2 26  < > 27  GLUCOSE 152*  < > 94  BUN 52*  < > 25*  CREATININE 0.65  < > 0.55  CALCIUM 8.9  < > 8.4*  MG  --   --  2.1  AST 26  --   --   ALT 17  --   --   ALKPHOS 60  --   --   BILITOT 0.6  --   --   < > = values in this interval not displayed. ------------------------------------------------------------------------------------------------------------------  Cardiac Enzymes  Recent Labs  Lab 03/31/16 1719  TROPONINI <0.03   ------------------------------------------------------------------------------------------------------------------  RADIOLOGY:  No results found.   ASSESSMENT AND PLAN:   83 year old female with history of hypertension, hyperlipidemia, chronic iron deficiency anemia, COPD, GERD who presents to the hospital due to melanotic stools.  1. GI bleed-suspected to be upper GI bleed given the patient's melanotic stools. -Hemoglobin down to 7. Will give one unit of PRBC's today.   Follow serial Hg.  - Possible Endoscopy today as per GI.  -continue Protonix drip for now.'  2. Hypokalemia - due to Gi losses from diarrhea.  - cont. Oral Potassium supplements and follow level. Mg. Level is normal.   3. Chronic iron deficiency anemia-patient follows up with the [facility] and gets iron infusions. - pt. Got an iron infusion last week.   4. HTN - cont. Losartan  5. Anxiety - cont. Paxil.   6. Hx of Gout - no acute attack cont. Colchicine.   7. Neuropathy - cont. Neurontin.   8. Glaucoma - cont. Timolol.     All the records are reviewed and case discussed with Care Management/Social Worker. Management plans discussed with the patient, family and they are in agreement.  CODE STATUS: Limited Code  DVT Prophylaxis: Ted's & SCD's.   TOTAL TIME TAKING CARE OF THIS PATIENT: 25 minutes.   POSSIBLE D/C IN 1-2 DAYS, DEPENDING ON CLINICAL CONDITION.   Henreitta Leber M.D on 04/02/2016 at 1:47 PM  Between 7am to 6pm - Pager - [phone]  After 6pm go to [email or web address] - Technical brewer [place] [facility]  Office  [phone]  CC: Primary care physician; Arrie Aran PATRICIA, DO

## 2016-04-03 ENCOUNTER — Inpatient Hospital Stay: Payer: Medicare Other | Admitting: Anesthesiology

## 2016-04-03 ENCOUNTER — Encounter
Admission: EM | Disposition: A | Discharge status: Home / Self Care | Payer: Self-pay | Source: Home / Self Care | Attending: Specialist

## 2016-04-03 ENCOUNTER — Encounter: Payer: Self-pay | Admitting: *Deleted

## 2016-04-03 DIAGNOSIS — K921 Melena: Secondary | ICD-10-CM

## 2016-04-03 HISTORY — PX: ESOPHAGOGASTRODUODENOSCOPY (EGD) WITH PROPOFOL: SHX5813

## 2016-04-03 LAB — POCT I-STAT 4, (NA,K, GLUC, HGB,HCT)
Glucose, Bld: 108 mg/dL — ABNORMAL HIGH (ref 65–99)
HCT: 21 % — ABNORMAL LOW (ref 36.0–46.0)
Hemoglobin: 7.1 g/dL — ABNORMAL LOW (ref 12.0–15.0)
Potassium: 2.8 mmol/L — ABNORMAL LOW (ref 3.5–5.1)
Sodium: 142 mmol/L (ref 135–145)

## 2016-04-03 LAB — CBC
HEMATOCRIT: 24.9 % — AB (ref 35.0–47.0)
Hemoglobin: 8.2 g/dL — ABNORMAL LOW (ref 12.0–16.0)
MCH: 27 pg (ref 26.0–34.0)
MCHC: 32.7 g/dL (ref 32.0–36.0)
MCV: 82.6 fL (ref 80.0–100.0)
PLATELETS: 254 10*3/uL (ref 150–440)
RBC: 3.02 MIL/uL — ABNORMAL LOW (ref 3.80–5.20)
RDW: 19.9 % — AB (ref 11.5–14.5)
WBC: 8.8 10*3/uL (ref 3.6–11.0)

## 2016-04-03 LAB — TYPE AND SCREEN
ABO/RH(D): O NEG
ANTIBODY SCREEN: NEGATIVE
Unit division: 0

## 2016-04-03 LAB — BPAM RBC
BLOOD PRODUCT EXPIRATION DATE: 201804042359
ISSUE DATE / TIME: 201803251059
UNIT TYPE AND RH: 9500

## 2016-04-03 LAB — POTASSIUM: Potassium: 3.2 mmol/L — ABNORMAL LOW (ref 3.5–5.1)

## 2016-04-03 SURGERY — ESOPHAGOGASTRODUODENOSCOPY (EGD) WITH PROPOFOL
Anesthesia: Choice

## 2016-04-03 SURGERY — ESOPHAGOGASTRODUODENOSCOPY (EGD) WITH PROPOFOL
Anesthesia: General

## 2016-04-03 MED ORDER — FENTANYL CITRATE (PF) 100 MCG/2ML IJ SOLN
INTRAMUSCULAR | Status: AC
  Filled 2016-04-03: qty 4

## 2016-04-03 MED ORDER — LIDOCAINE HCL (PF) 2 % IJ SOLN
INTRAMUSCULAR | Status: AC
  Filled 2016-04-03: qty 2

## 2016-04-03 MED ORDER — PROPOFOL 10 MG/ML IV BOLUS
INTRAVENOUS | Status: AC
  Filled 2016-04-03: qty 20

## 2016-04-03 MED ORDER — MIDAZOLAM HCL 5 MG/5ML IJ SOLN
INTRAMUSCULAR | Status: AC
  Filled 2016-04-03: qty 10

## 2016-04-03 MED ORDER — MIDAZOLAM HCL 5 MG/5ML IJ SOLN
INTRAMUSCULAR | Status: DC | PRN
  Administered 2016-04-03: 1 mg via INTRAVENOUS
  Administered 2016-04-03: 2 mg via INTRAVENOUS
  Administered 2016-04-03 (×2): 1 mg via INTRAVENOUS

## 2016-04-03 NOTE — Progress Notes (Signed)
On-call MD paged for Protonix drip for only 72 hrs; no PO Protonix ordered;  Awaiting call back. Barbaraann Faster, RN 10:44 PM 04/03/2016

## 2016-04-03 NOTE — Progress Notes (Signed)
Dr. Jannifer Franklin consulted for PPI continuation; "Does not need any more PPI".  Acknowledged. Barbaraann Faster, RN 10:57 PM 04/03/2016

## 2016-04-03 NOTE — Op Note (Signed)
[facility] Gastroenterology Patient Name: [name] [name] Procedure Date: 04/03/2016 11:40 AM MRN: [id number] Account #: [id number] Date of Birth: [birth year] Admit Type: Inpatient Age: 83 Room: [facility] ENDO ROOM 4 Gender: Female Note Status: Finalized Procedure:            Upper GI endoscopy Indications:          Melena Providers:            Lucilla Lame MD, MD Medicines:            Midazolam 5 mg IV, The level of sedation administered                        was moderate, Sedative medications given in increments                        to maintain adequate moderate sedation Complications:        No immediate complications. Procedure:            Pre-Anesthesia Assessment:                       - Prior to the procedure, a History and Physical was                        performed, and patient medications and allergies were                        reviewed. The patient's tolerance of previous                        anesthesia was also reviewed. The risks and benefits of                        the procedure and the sedation options and risks were                        discussed with the patient. All questions were                        answered, and informed consent was obtained. Prior                        Anticoagulants: The patient has taken no previous                        anticoagulant or antiplatelet agents. ASA Grade                        Assessment: II - A patient with mild systemic disease.                        After reviewing the risks and benefits, the patient was                        deemed in satisfactory condition to undergo the                        procedure.                       After obtaining  informed consent, the endoscope was                        passed under direct vision. Throughout the procedure,                        the patient's blood pressure, pulse, and oxygen                        saturations were monitored continuously. The  Endoscope                        was introduced through the mouth, and advanced to the                        second part of duodenum. The upper GI endoscopy was                        accomplished without difficulty. The patient tolerated                        the procedure well. Findings:      A small hiatal hernia was present.      The stomach was normal.      The examined duodenum was normal. Impression:           - Small hiatal hernia.                       - Normal stomach.                       - Normal examined duodenum.                       - No specimens collected. Recommendation:       - Return patient to hospital ward for ongoing care. Procedure Code(s):    --- Professional ---                       [phone], Esophagogastroduodenoscopy, flexible, transoral;                        diagnostic, including collection of specimen(s) by                        brushing or washing, when performed (separate procedure) Diagnosis Code(s):    --- Professional ---                       K92.1, Melena (includes Hematochezia) CPT copyright 2016 [employer]. All rights reserved. The codes documented in this report are preliminary and upon coder review may  be revised to meet current compliance requirements. Lucilla Lame MD, MD 04/03/2016 1:07:21 PM This report has been signed electronically. Number of Addenda: 0 Note Initiated On: 04/03/2016 11:40 AM      [facility]

## 2016-04-04 ENCOUNTER — Encounter: Payer: Self-pay | Admitting: Gastroenterology

## 2016-04-04 LAB — CBC
HCT: 26.4 % — ABNORMAL LOW (ref 35.0–47.0)
HEMOGLOBIN: 8.9 g/dL — AB (ref 12.0–16.0)
MCH: 28.2 pg (ref 26.0–34.0)
MCHC: 33.7 g/dL (ref 32.0–36.0)
MCV: 83.9 fL (ref 80.0–100.0)
Platelets: 246 10*3/uL (ref 150–440)
RBC: 3.15 MIL/uL — AB (ref 3.80–5.20)
RDW: 19.1 % — ABNORMAL HIGH (ref 11.5–14.5)
WBC: 7.2 10*3/uL (ref 3.6–11.0)

## 2016-04-04 LAB — POTASSIUM: Potassium: 3.1 mmol/L — ABNORMAL LOW (ref 3.5–5.1)

## 2016-04-04 NOTE — Evaluation (Signed)
Physical Therapy Evaluation Patient Details Name: [name] [name] MRN: [id number] DOB: January 30, 1925 Today's Date: 04/04/2016   History of Present Illness  83 y/o with chronic anemia, has iron treatments as an outpatient.  She had some rectal bleeding and low HH, had transfusion and generally has been feeling better.  Clinical Impression  Pt is able to ambulate well in the hallway with her baseline cane and generally showed good confidence and safety.  She easily was able to get to sitting at EOB and to standing and though she had some minimal fatigue with ambulation she ultimately was safe and should be able to return home w/o issue.      Follow Up Recommendations No PT follow up    Equipment Recommendations       Recommendations for Other Services       Precautions / Restrictions Restrictions Weight Bearing Restrictions: No      Mobility  Bed Mobility Overal bed mobility: Independent                Transfers Overall transfer level: Independent Equipment used: Straight cane             General transfer comment: Pt able to rise confidently and w/o safety concerns  Ambulation/Gait Ambulation/Gait assistance: Modified independent (Device/Increase time) Ambulation Distance (Feet): 200 Feet Assistive device: Rolling walker (2 wheeled)       General Gait Details: Pt generally did very well with ambualtion and though she needed to stop and briefly rest X1 her O2 and HR were stable and appropriate and she had no LOBs or other issues.   Stairs            Wheelchair Mobility    Modified Rankin (Stroke Patients Only)       Balance Overall balance assessment: Modified Independent                                           Pertinent Vitals/Pain Pain Assessment: No/denies pain    Home Living Family/patient expects to be discharged to:: Assisted living               Home Equipment: [name] [name] - single point      Prior Function  Level of Independence: Independent with assistive device(s)         Comments: Had a fall last September, generally is able to be active and mobile no longer drives secondary to vision changes     Hand Dominance        Extremity/Trunk Assessment   Upper Extremity Assessment Upper Extremity Assessment: Overall WFL for tasks assessed    Lower Extremity Assessment Lower Extremity Assessment: Overall WFL for tasks assessed       Communication   Communication: No difficulties;HOH  Cognition Arousal/Alertness: Awake/alert Behavior During Therapy: WFL for tasks assessed/performed Overall Cognitive Status: Within Functional Limits for tasks assessed                                        General Comments      Exercises     Assessment/Plan    PT Assessment Patent does not need any further PT services  PT Problem List         PT Treatment Interventions      PT Goals (Current goals can be  found in the Care Plan section)  Acute Rehab PT Goals Patient Stated Goal: go home PT Goal Formulation: With patient    Frequency     Barriers to discharge        Co-evaluation               End of Session Equipment Utilized During Treatment: Gait belt Activity Tolerance: Patient tolerated treatment well Patient left: with bed alarm set;with call bell/phone within reach Nurse Communication: Mobility status PT Visit Diagnosis: Muscle weakness (generalized) (M62.81)    Time: 6384-5364 PT Time Calculation (min) (ACUTE ONLY): 22 min   Charges:   PT Evaluation $PT Eval Low Complexity: 1 Procedure     PT G CodesKreg [name], DPT 04/04/2016, 9:46 AM

## 2016-04-04 NOTE — Discharge Summary (Signed)
[facility] [place] NAME: [name] [name]    MR#:  [id number]  DATE OF BIRTH:  01/24/1925  DATE OF ADMISSION:  03/31/2016 ADMITTING PHYSICIAN: Loletha Grayer, MD  DATE OF DISCHARGE: 04/04/2016  PRIMARY CARE PHYSICIAN: Arrie Aran PATRICIA, DO    ADMISSION DIAGNOSIS:  Melena [K92.1]  DISCHARGE DIAGNOSIS:  Active Problems:   GI bleed   Melena   SECONDARY DIAGNOSIS:   Past Medical History:  Diagnosis Date  . Anemia   . COPD (chronic obstructive pulmonary disease) ([place])   . Diverticulitis   . GERD (gastroesophageal reflux disease)   . Glaucoma   . HLD (hyperlipidemia)   . Hypertension   . Iron deficiency   . Rectal bleeding     HOSPITAL COURSE:   83 year old female with history of hypertension, hyperlipidemia, chronic iron deficiency anemia, COPD, GERD who presents to the hospital due to melanotic stools.  1. GI bleed-suspected to be upper GI bleed given the patient's melanotic stools. -Patient was transfused a total of 2 units of packed red blood cells, hemoglobin has improved posttransfusion is currently stable. -Patient was placed on a Protonix drip, a gastroenterology consult obtained. Patient underwent an upper GI endoscopy which showed no evidence of acute bleeding. She's had no further melanotic stools, and is hemodynamically stable for being discharged home and follow-up as outpatient with gastroenterology. -Patient may need further workup with a capsule endoscopy or colonoscopy as an outpatient. - she will cont. Aciphex, Omeprazole.  2. Hypokalemia - due to Gi losses from diarrhea.  -Patient was placed on oral and intravenous potassium supplements and her hypokalemia has now resolved  3. Chronic iron deficiency anemia-patient follows up with the [facility] and gets iron infusions. - she will cont. Follow up with them as outpatient.   4. HTN - she will cont. Losartan  5. Anxiety - she will cont. Paxil.    6. Hx of Gout - no acute attack while in the hospital and pt. Will cont. Colchicine.   7. Neuropathy - she will cont. Neurontin.   8. Glaucoma - she will cont. Timolol.    DISCHARGE CONDITIONS:   Stable.   CONSULTS OBTAINED:  Treatment Team:  Lin Landsman, MD Lucilla Lame, MD  DRUG ALLERGIES:   Allergies  Allergen Reactions  . Codeine Nausea And Vomiting  . Dextrans Hives  . Fentanyl Itching  . Lipitor [Atorvastatin] Other (See Comments)    Reaction: Muscle pain  . Mobic [Meloxicam] Other (See Comments)    Reaction: Mouth and tongue ulcers  . Niacin And Related Itching  . Nsaids Hives  . Pravachol [Pravastatin Sodium] Other (See Comments)    Reaction: Muscle pain  . Statins   . Voltaren [Diclofenac Sodium] Other (See Comments)    Reaction: Mouth and tongue ulcers  . Zetia [Ezetimibe] Other (See Comments)    Reaction: Leg pain    DISCHARGE MEDICATIONS:   Allergies as of 04/04/2016      Reactions   Codeine Nausea And Vomiting   Dextrans Hives   Fentanyl Itching   Lipitor [atorvastatin] Other (See Comments)   Reaction: Muscle pain   Mobic [meloxicam] Other (See Comments)   Reaction: Mouth and tongue ulcers   Niacin And Related Itching   Nsaids Hives   Pravachol [pravastatin Sodium] Other (See Comments)   Reaction: Muscle pain   Statins    Voltaren [diclofenac Sodium] Other (See Comments)   Reaction: Mouth and tongue ulcers   Zetia [ezetimibe] Other (See  Comments)   Reaction: Leg pain      Medication List    STOP taking these medications   hydrochlorothiazide 12.5 MG tablet Commonly known as:  HYDRODIURIL   HYDROcodone-acetaminophen 5-325 MG tablet Commonly known as:  NORCO/VICODIN     TAKE these medications   acetaminophen 650 MG CR tablet Commonly known as:  TYLENOL Take 650 mg by mouth every 8 (eight) hours as needed for pain.   albuterol 108 (90 Base) MCG/ACT inhaler Commonly known as:  PROVENTIL HFA;VENTOLIN HFA Inhale 2 puffs  into the lungs every 4 (four) hours as needed for wheezing or shortness of breath.   B-12 1000 MCG Caps Take 2,000 Units by mouth 2 (two) times daily.   chlorthalidone 25 MG tablet Commonly known as:  HYGROTON Take 12.5 mg by mouth daily.   COENZYME Q10 PO Take 1 tablet by mouth daily.   colchicine 0.6 MG tablet Take 0.6 mg by mouth daily.   cycloSPORINE 0.05 % ophthalmic emulsion Commonly known as:  RESTASIS Place 1 drop into both eyes 2 (two) times daily.   EYE DROPS ADVANCED RELIEF OP Apply to eye.   fluticasone 110 MCG/ACT inhaler Commonly known as:  FLOVENT HFA Inhale 1 puff into the lungs 2 (two) times daily.   fluticasone 50 MCG/ACT nasal spray Commonly known as:  FLONASE USE TWO SPRAYS IN BOTH NOSTRILS ONCE DAILY   gabapentin 300 MG capsule Commonly known as:  NEURONTIN Take 300 mg by mouth 2 (two) times daily.   guaiFENesin 600 MG 12 hr tablet Commonly known as:  MUCINEX Take 600 mg by mouth 2 (two) times daily as needed.   latanoprost 0.005 % ophthalmic solution Commonly known as:  XALATAN Apply 1 drop to eye at bedtime.   loratadine 10 MG tablet Commonly known as:  CLARITIN Take 10 mg by mouth daily as needed for allergies.   losartan 50 MG tablet Commonly known as:  COZAAR Take 50 mg by mouth daily.   mometasone 0.1 % lotion Commonly known as:  ELOCON Apply topically daily.   multivitamin tablet Take 1 tablet by mouth daily.   mupirocin ointment 2 % Commonly known as:  BACTROBAN Place 1 application into the nose 2 (two) times daily.   OCUVITE PRESERVISION PO Take 1 tablet by mouth 2 (two) times daily.   omeprazole 20 MG capsule Commonly known as:  PRILOSEC Take 20 mg by mouth 2 (two) times daily before a meal.   PARoxetine 40 MG tablet Commonly known as:  PAXIL Take 40 mg by mouth every morning.   potassium chloride 10 MEQ tablet Commonly known as:  K-DUR,KLOR-CON Take 10 mEq by mouth 2 (two) times daily.   PROCTOSOL HC 2.5 %  rectal cream Generic drug:  hydrocortisone PLACE RECTALLY TWICE A DAY   RA KRILL OIL 500 MG Caps Take 1 capsule by mouth.   RABEprazole 20 MG tablet Commonly known as:  ACIPHEX Take 20 mg by mouth 2 (two) times daily.   saccharomyces boulardii 250 MG capsule Commonly known as:  FLORASTOR Take 250 mg by mouth daily.   sucralfate 1 g tablet Commonly known as:  CARAFATE Take 1 tablet (1 g total) by mouth 4 (four) times daily -  with meals and at bedtime.   SYSTANE 0.4-0.3 % Soln Generic drug:  Polyethyl Glycol-Propyl Glycol Apply 1 drop to eye as needed.   timolol 0.5 % ophthalmic gel-forming Commonly known as:  TIMOPTIC-XR Place 1 drop into both eyes daily.   valACYclovir 1000  MG tablet Commonly known as:  VALTREX once daily as needed. Reported on 03/22/2015   Vitamin D (Ergocalciferol) 50000 units Caps capsule Commonly known as:  DRISDOL Take by mouth.   VITAMIN D PO Take 1 tablet by mouth daily.         DISCHARGE INSTRUCTIONS:   DIET:  Cardiac diet  DISCHARGE CONDITION:  Stable  ACTIVITY:  Activity as tolerated  OXYGEN:  Home Oxygen: No.   Oxygen Delivery: room air  DISCHARGE LOCATION:  home   If you experience worsening of your admission symptoms, develop shortness of breath, life threatening emergency, suicidal or homicidal thoughts you must seek medical attention immediately by calling 911 or calling your MD immediately  if symptoms less severe.  You Must read complete instructions/literature along with all the possible adverse reactions/side effects for all the Medicines you take and that have been prescribed to you. Take any new Medicines after you have completely understood and accpet all the possible adverse reactions/side effects.   Please note  You were cared for by a hospitalist during your hospital stay. If you have any questions about your discharge medications or the care you received while you were in the hospital after you are  discharged, you can call the unit and asked to speak with the hospitalist on call if the hospitalist that took care of you is not available. Once you are discharged, your primary care physician will handle any further medical issues. Please note that NO REFILLS for any discharge medications will be authorized once you are discharged, as it is imperative that you return to your primary care physician (or establish a relationship with a primary care physician if you do not have one) for your aftercare needs so that they can reassess your need for medications and monitor your lab values.     Today   Hg. Stable.  No Melena overnight.  Walked w/ PT and does not need any services. D/c home today.   VITAL SIGNS:  Blood pressure (!) 161/60, pulse 76, temperature 97.7 F (36.5 C), resp. rate 18, height 5\' 9"  (1.753 m), weight 83.9 kg (185 lb), SpO2 95 %.  I/O:   Intake/Output Summary (Last 24 hours) at 04/04/16 1340 Last data filed at 04/04/16 0900  Gross per 24 hour  Intake              735 ml  Output                0 ml  Net              735 ml    PHYSICAL EXAMINATION:   GENERAL:  83 y.o.-year-old patient lying in bed in no acute distress.  EYES: Pupils equal, round, reactive to light and accommodation. No scleral icterus. Extraocular muscles intact.  HEENT: Head atraumatic, normocephalic. Oropharynx and nasopharynx clear.  NECK:  Supple, no jugular venous distention. No thyroid enlargement, no tenderness.  LUNGS: Normal breath sounds bilaterally, no wheezing, rales, rhonchi. No use of accessory muscles of respiration.  CARDIOVASCULAR: S1, S2 normal. II/VI SEM at LSB, No rubs, or gallops.  ABDOMEN: Soft, nontender, nondistended. Bowel sounds present. No organomegaly or mass.  EXTREMITIES: No cyanosis, clubbing or edema b/l.    NEUROLOGIC: Cranial nerves II through XII are intact. No focal Motor or sensory deficits b/l.   PSYCHIATRIC: The patient is alert and oriented x 3.  SKIN: No  obvious rash, lesion, or ulcer.   DATA REVIEW:   CBC  Recent Labs  Lab 04/04/16 0409  WBC 7.2  HGB 8.9*  HCT 26.4*  PLT 246    Chemistries   Recent Labs Lab 03/31/16 1719  04/02/16 0524  04/04/16 0409  NA 134*  < > 137  --   --   K 3.5  < > 2.6*  < > 3.1*  CL 98*  < > 104  --   --   CO2 26  < > 27  --   --   GLUCOSE 152*  < > 94  --   --   BUN 52*  < > 25*  --   --   CREATININE 0.65  < > 0.55  --   --   CALCIUM 8.9  < > 8.4*  --   --   MG  --   --  2.1  --   --   AST 26  --   --   --   --   ALT 17  --   --   --   --   ALKPHOS 60  --   --   --   --   BILITOT 0.6  --   --   --   --   < > = values in this interval not displayed.  Cardiac Enzymes  Recent Labs Lab 03/31/16 1719  TROPONINI <0.03    Microbiology Results  Results for orders placed or performed during the hospital encounter of 03/31/16  MRSA PCR Screening     Status: None   Collection Time: 03/31/16 10:05 PM  Result Value Ref Range Status   MRSA by PCR NEGATIVE NEGATIVE Final    Comment:        The GeneXpert MRSA Assay (FDA approved for NASAL specimens only), is one component of a comprehensive MRSA colonization surveillance program. It is not intended to diagnose MRSA infection nor to guide or monitor treatment for MRSA infections.     RADIOLOGY:  No results found.    Management plans discussed with the patient, family and they are in agreement.  CODE STATUS:     Code Status Orders        Start     Ordered   03/31/16 2257  Limited resuscitation (code)  Continuous    Question Answer Comment  In the event of cardiac or respiratory ARREST: Initiate Code Blue, Call Rapid Response Yes   In the event of cardiac or respiratory ARREST: Perform CPR Yes   In the event of cardiac or respiratory ARREST: Perform Intubation/Mechanical Ventilation No   In the event of cardiac or respiratory ARREST: Use NIPPV/BiPAp only if indicated Yes   In the event of cardiac or respiratory ARREST:  Administer ACLS medications if indicated Yes   In the event of cardiac or respiratory ARREST: Perform Defibrillation or Cardioversion if indicated Yes      03/31/16 2257    Advance Directive Documentation     Most Recent Value  Type of Advance Directive  Healthcare Power of Attorney  Pre-existing out of facility DNR order (yellow form or pink MOST form)  -  "MOST" Form in Place?  -      [place] THIS PATIENT: 40 minutes.    Henreitta Leber M.D on 04/04/2016 at 1:40 PM  Between 7am to 6pm - Pager - [phone]  After 6pm go to [email or web address] - [employer] [facility]  Office  [phone]  CC: Primary care physician; Arrie Aran PATRICIA, DO

## 2016-04-04 NOTE — Care Management Important Message (Signed)
Important Message  Patient Details  Name: [name] [name] MRN: [id number] Date of Birth: 06-21-1925   Medicare Important Message Given:  Yes    Beverly Sessions, RN 04/04/2016, 9:57 AM

## 2016-04-04 NOTE — Progress Notes (Signed)
IV was removed. Discharge instructions and follow-up appointments were provided to the pt. All questions were answered. The pt is currently waiting on son in law to transport to [facility].

## 2016-04-12 ENCOUNTER — Inpatient Hospital Stay
Admission: EM | Admit: 2016-04-12 | Discharge: 2016-04-15 | DRG: 394 | Disposition: A | Discharge status: Home / Self Care | Payer: Medicare Other | Attending: Internal Medicine | Admitting: Internal Medicine

## 2016-04-12 ENCOUNTER — Other Ambulatory Visit: Payer: Self-pay

## 2016-04-12 ENCOUNTER — Encounter: Payer: Self-pay | Admitting: Intensive Care

## 2016-04-12 DIAGNOSIS — G629 Polyneuropathy, unspecified: Secondary | ICD-10-CM | POA: Diagnosis present

## 2016-04-12 DIAGNOSIS — K922 Gastrointestinal hemorrhage, unspecified: Secondary | ICD-10-CM | POA: Diagnosis present

## 2016-04-12 DIAGNOSIS — Z7951 Long term (current) use of inhaled steroids: Secondary | ICD-10-CM

## 2016-04-12 DIAGNOSIS — M109 Gout, unspecified: Secondary | ICD-10-CM | POA: Diagnosis present

## 2016-04-12 DIAGNOSIS — K5730 Diverticulosis of large intestine without perforation or abscess without bleeding: Secondary | ICD-10-CM | POA: Diagnosis present

## 2016-04-12 DIAGNOSIS — H409 Unspecified glaucoma: Secondary | ICD-10-CM | POA: Diagnosis present

## 2016-04-12 DIAGNOSIS — K219 Gastro-esophageal reflux disease without esophagitis: Secondary | ICD-10-CM | POA: Diagnosis present

## 2016-04-12 DIAGNOSIS — D509 Iron deficiency anemia, unspecified: Secondary | ICD-10-CM | POA: Diagnosis present

## 2016-04-12 DIAGNOSIS — Z885 Allergy status to narcotic agent status: Secondary | ICD-10-CM | POA: Diagnosis not present

## 2016-04-12 DIAGNOSIS — Z87891 Personal history of nicotine dependence: Secondary | ICD-10-CM | POA: Diagnosis not present

## 2016-04-12 DIAGNOSIS — E876 Hypokalemia: Secondary | ICD-10-CM | POA: Diagnosis present

## 2016-04-12 DIAGNOSIS — Z66 Do not resuscitate: Secondary | ICD-10-CM | POA: Diagnosis present

## 2016-04-12 DIAGNOSIS — I10 Essential (primary) hypertension: Secondary | ICD-10-CM | POA: Diagnosis present

## 2016-04-12 DIAGNOSIS — J449 Chronic obstructive pulmonary disease, unspecified: Secondary | ICD-10-CM | POA: Diagnosis present

## 2016-04-12 DIAGNOSIS — K921 Melena: Secondary | ICD-10-CM | POA: Diagnosis present

## 2016-04-12 DIAGNOSIS — Z79899 Other long term (current) drug therapy: Secondary | ICD-10-CM

## 2016-04-12 DIAGNOSIS — E785 Hyperlipidemia, unspecified: Secondary | ICD-10-CM | POA: Diagnosis present

## 2016-04-12 DIAGNOSIS — D122 Benign neoplasm of ascending colon: Principal | ICD-10-CM | POA: Diagnosis present

## 2016-04-12 DIAGNOSIS — F419 Anxiety disorder, unspecified: Secondary | ICD-10-CM | POA: Diagnosis present

## 2016-04-12 DIAGNOSIS — F329 Major depressive disorder, single episode, unspecified: Secondary | ICD-10-CM | POA: Diagnosis present

## 2016-04-12 LAB — CBC WITH DIFFERENTIAL/PLATELET
BASOS ABS: 0.1 10*3/uL (ref 0–0.1)
BASOS PCT: 1 %
EOS PCT: 2 %
Eosinophils Absolute: 0.2 10*3/uL (ref 0–0.7)
HCT: 27.4 % — ABNORMAL LOW (ref 35.0–47.0)
Hemoglobin: 9 g/dL — ABNORMAL LOW (ref 12.0–16.0)
LYMPHS PCT: 15 %
Lymphs Abs: 1.3 10*3/uL (ref 1.0–3.6)
MCH: 27.9 pg (ref 26.0–34.0)
MCHC: 33 g/dL (ref 32.0–36.0)
MCV: 84.6 fL (ref 80.0–100.0)
Monocytes Absolute: 0.9 10*3/uL (ref 0.2–0.9)
Monocytes Relative: 10 %
Neutro Abs: 6.3 10*3/uL (ref 1.4–6.5)
Neutrophils Relative %: 72 %
PLATELETS: 379 10*3/uL (ref 150–440)
RBC: 3.24 MIL/uL — AB (ref 3.80–5.20)
RDW: 23 % — AB (ref 11.5–14.5)
WBC: 8.7 10*3/uL (ref 3.6–11.0)

## 2016-04-12 LAB — HEMOGLOBIN AND HEMATOCRIT, BLOOD
HEMATOCRIT: 25.1 % — AB (ref 35.0–47.0)
HEMOGLOBIN: 8.2 g/dL — AB (ref 12.0–16.0)

## 2016-04-12 LAB — BASIC METABOLIC PANEL
Anion gap: 9 (ref 5–15)
BUN: 28 mg/dL — ABNORMAL HIGH (ref 6–20)
CHLORIDE: 104 mmol/L (ref 101–111)
CO2: 24 mmol/L (ref 22–32)
Calcium: 9.1 mg/dL (ref 8.9–10.3)
Creatinine, Ser: 0.63 mg/dL (ref 0.44–1.00)
GFR calc Af Amer: >60 mL/min (ref >60)
GFR calc non Af Amer: >60 mL/min (ref >60)
Glucose, Bld: 123 mg/dL — ABNORMAL HIGH (ref 65–99)
POTASSIUM: 3.2 mmol/L — AB (ref 3.5–5.1)
Sodium: 137 mmol/L (ref 135–145)

## 2016-04-12 LAB — URINALYSIS, COMPLETE (UACMP) WITH MICROSCOPIC
Bacteria, UA: NONE SEEN
Bilirubin Urine: NEGATIVE
Glucose, UA: NEGATIVE mg/dL
HGB URINE DIPSTICK: NEGATIVE
KETONES UR: NEGATIVE mg/dL
LEUKOCYTES UA: NEGATIVE
Nitrite: NEGATIVE
PROTEIN: NEGATIVE mg/dL
Specific Gravity, Urine: 1.013 (ref 1.005–1.030)
pH: 7 (ref 5.0–8.0)

## 2016-04-12 LAB — TYPE AND SCREEN
ABO/RH(D): O NEG
ANTIBODY SCREEN: NEGATIVE

## 2016-04-12 MED ORDER — ONDANSETRON HCL 4 MG/2ML IJ SOLN: 4.0000 mg | Freq: Four times a day (QID) | INTRAMUSCULAR | Status: DC | PRN

## 2016-04-12 MED ORDER — FLUTICASONE PROPIONATE HFA 110 MCG/ACT IN AERO: 1.0000 | INHALATION_SPRAY | Freq: Two times a day (BID) | RESPIRATORY_TRACT | Status: DC

## 2016-04-12 MED ORDER — PANTOPRAZOLE SODIUM 40 MG IV SOLR: 40.0000 mg | Freq: Two times a day (BID) | INTRAVENOUS | Status: DC

## 2016-04-12 MED ORDER — SENNOSIDES-DOCUSATE SODIUM 8.6-50 MG PO TABS
1.0000 | ORAL_TABLET | Freq: Every evening | ORAL | Status: DC | PRN
Start: 2016-04-12 — End: 2016-04-15

## 2016-04-12 MED ORDER — GABAPENTIN 300 MG PO CAPS
300.0000 mg | ORAL_CAPSULE | Freq: Two times a day (BID) | ORAL | Status: DC
  Administered 2016-04-13 – 2016-04-15 (×4): 300 mg via ORAL
  Filled 2016-04-12 (×5): qty 1

## 2016-04-12 MED ORDER — BISACODYL 5 MG PO TBEC: 5.0000 mg | DELAYED_RELEASE_TABLET | Freq: Every day | ORAL | Status: DC | PRN

## 2016-04-12 MED ORDER — SODIUM CHLORIDE 0.9 % IV SOLN
INTRAVENOUS | Status: DC
  Administered 2016-04-12 – 2016-04-14 (×4): via INTRAVENOUS

## 2016-04-12 MED ORDER — LATANOPROST 0.005 % OP SOLN
1.0000 [drp] | Freq: Every day | OPHTHALMIC | Status: DC
  Administered 2016-04-13 – 2016-04-15 (×3): 1 [drp] via OPHTHALMIC

## 2016-04-12 MED ORDER — HYDROCODONE-ACETAMINOPHEN 5-325 MG PO TABS: 1.0000 | ORAL_TABLET | ORAL | Status: DC | PRN

## 2016-04-12 MED ORDER — BUDESONIDE 0.25 MG/2ML IN SUSP
0.2500 mg | Freq: Two times a day (BID) | RESPIRATORY_TRACT | Status: DC
  Administered 2016-04-12 – 2016-04-15 (×6): 0.25 mg via RESPIRATORY_TRACT
  Filled 2016-04-12 (×6): qty 2

## 2016-04-12 MED ORDER — SUCRALFATE 1 G PO TABS
1.0000 g | ORAL_TABLET | Freq: Three times a day (TID) | ORAL | Status: DC
  Administered 2016-04-14 – 2016-04-15 (×2): 1 g via ORAL
  Filled 2016-04-12 (×3): qty 1

## 2016-04-12 MED ORDER — TIMOLOL MALEATE 0.5 % OP SOLG
1.0000 [drp] | Freq: Every day | OPHTHALMIC | Status: DC
  Filled 2016-04-12: qty 5

## 2016-04-12 MED ORDER — MOMETASONE FUROATE 0.1 % EX SOLN
Freq: Every day | CUTANEOUS | Status: DC
Start: 2016-04-12 — End: 2016-04-12

## 2016-04-12 MED ORDER — ACETAMINOPHEN 325 MG PO TABS: 650.0000 mg | ORAL_TABLET | Freq: Four times a day (QID) | ORAL | Status: DC | PRN

## 2016-04-12 MED ORDER — LATANOPROST 0.005 % OP SOLN
1.0000 [drp] | Freq: Every day | OPHTHALMIC | Status: DC
  Filled 2016-04-12: qty 2.5

## 2016-04-12 MED ORDER — LORATADINE 10 MG PO TABS: 10.0000 mg | ORAL_TABLET | Freq: Every day | ORAL | Status: DC | PRN

## 2016-04-12 MED ORDER — ALBUTEROL SULFATE (2.5 MG/3ML) 0.083% IN NEBU: 3.0000 mL | INHALATION_SOLUTION | RESPIRATORY_TRACT | Status: DC | PRN

## 2016-04-12 MED ORDER — LOSARTAN POTASSIUM 50 MG PO TABS
50.0000 mg | ORAL_TABLET | Freq: Every day | ORAL | Status: DC
  Administered 2016-04-13 – 2016-04-15 (×2): 50 mg via ORAL
  Filled 2016-04-12 (×3): qty 1

## 2016-04-12 MED ORDER — TIMOLOL MALEATE 0.5 % OP SOLG
1.0000 [drp] | Freq: Every day | OPHTHALMIC | Status: DC
  Administered 2016-04-12 – 2016-04-14 (×3): 1 [drp] via OPHTHALMIC
  Filled 2016-04-12: qty 5

## 2016-04-12 MED ORDER — COLCHICINE 0.6 MG PO TABS
0.6000 mg | ORAL_TABLET | Freq: Every day | ORAL | Status: DC
  Administered 2016-04-13 – 2016-04-15 (×2): 0.6 mg via ORAL
  Filled 2016-04-12 (×4): qty 1

## 2016-04-12 MED ORDER — CYCLOSPORINE 0.05 % OP EMUL
1.0000 [drp] | Freq: Two times a day (BID) | OPHTHALMIC | Status: DC
  Administered 2016-04-12 – 2016-04-15 (×6): 1 [drp] via OPHTHALMIC
  Filled 2016-04-12 (×7): qty 1

## 2016-04-12 MED ORDER — ONDANSETRON HCL 4 MG PO TABS: 4.0000 mg | ORAL_TABLET | Freq: Four times a day (QID) | ORAL | Status: DC | PRN

## 2016-04-12 MED ORDER — ACETAMINOPHEN 650 MG RE SUPP: 650.0000 mg | Freq: Four times a day (QID) | RECTAL | Status: DC | PRN

## 2016-04-12 MED ORDER — SODIUM CHLORIDE 0.9 % IV SOLN
8.0000 mg/h | INTRAVENOUS | Status: DC
  Administered 2016-04-12 – 2016-04-14 (×4): 8 mg/h via INTRAVENOUS
  Filled 2016-04-12 (×5): qty 80

## 2016-04-12 MED ORDER — PANTOPRAZOLE SODIUM 40 MG IV SOLR
80.0000 mg | Freq: Once | INTRAVENOUS | Status: AC
  Administered 2016-04-12: 80 mg via INTRAVENOUS
  Filled 2016-04-12: qty 80

## 2016-04-12 MED ORDER — PAROXETINE HCL 20 MG PO TABS
40.0000 mg | ORAL_TABLET | ORAL | Status: DC
Start: 2016-04-13 — End: 2016-04-15
  Administered 2016-04-15: 40 mg via ORAL
  Filled 2016-04-12: qty 2

## 2016-04-12 MED ORDER — TRIAMCINOLONE ACETONIDE 0.1 % EX CREA
TOPICAL_CREAM | Freq: Every day | CUTANEOUS | Status: DC
  Filled 2016-04-12: qty 15

## 2016-04-12 NOTE — H&P (Signed)
[facility] [place] NAME: [name] [name]    MR#:  [id number]  DATE OF BIRTH:  1925/10/14  DATE OF ADMISSION:  04/12/2016  PRIMARY CARE PHYSICIAN: Arrie Aran PATRICIA, DO   REQUESTING/REFERRING PHYSICIAN: dr Alfred Levins  CHIEF COMPLAINT:  Dark-colored stools and weakness  HISTORY OF PRESENT ILLNESS:  [name] [name]  is a 83 y.o. female with a known history of Recurrent upper GI bleeding, COPD and chronic iron deficiency anemia who presents today due to dark color stools. Patient was discharged from the hospital on March 27 due to GI bleed. At that time she presented with melanotic stools. She was transfused 2 units of PRBCs. She underwent upper GI endoscopy which showed no evidence of acute bleeding. Plan was for capsule endoscopy or colonoscopy as an outpatient. She has been doing well for the past few days up until this morning when she's had several episodes of melanotic stools. She is also complaining of cramping epigastric abdominal pain. She is taking Carafate once a day and a PPI. She denies NSAID use.   PAST MEDICAL HISTORY:   Past Medical History:  Diagnosis Date  . Anemia   . COPD (chronic obstructive pulmonary disease) ([place])   . Diverticulitis   . GERD (gastroesophageal reflux disease)   . Glaucoma   . HLD (hyperlipidemia)   . Hypertension   . Iron deficiency   . Rectal bleeding     PAST SURGICAL HISTORY:   Past Surgical History:  Procedure Laterality Date  . ABDOMINAL HYSTERECTOMY    . APPENDECTOMY    . ESOPHAGOGASTRODUODENOSCOPY (EGD) WITH PROPOFOL N/A 08/12/2015   Procedure: ESOPHAGOGASTRODUODENOSCOPY (EGD) WITH PROPOFOL;  Surgeon: Lollie Sails, MD;  Location: [facility] ENDOSCOPY;  Service: Endoscopy;  Laterality: N/A;  . ESOPHAGOGASTRODUODENOSCOPY (EGD) WITH PROPOFOL N/A 04/03/2016   Procedure: ESOPHAGOGASTRODUODENOSCOPY (EGD) WITH PROPOFOL;  Surgeon: Lucilla Lame, MD;  Location: [facility] ENDOSCOPY;  Service:  Endoscopy;  Laterality: N/A;  . JOINT REPLACEMENT      SOCIAL HISTORY:   Social History  Substance Use Topics  . Smoking status: Former Smoker    Types: Cigarettes  . Smokeless tobacco: Never Used  . Alcohol use No    FAMILY HISTORY:   Family History  Problem Relation Age of Onset  . CAD Mother   . CAD Father   . CAD Brother     DRUG ALLERGIES:   Allergies  Allergen Reactions  . Codeine Nausea And Vomiting  . Dextrans Hives  . Fentanyl Itching  . Lipitor [Atorvastatin] Other (See Comments)    Reaction: Muscle pain  . Mobic [Meloxicam] Other (See Comments)    Reaction: Mouth and tongue ulcers  . Niacin And Related Itching  . Nsaids Hives  . Pravachol [Pravastatin Sodium] Other (See Comments)    Reaction: Muscle pain  . Statins   . Voltaren [Diclofenac Sodium] Other (See Comments)    Reaction: Mouth and tongue ulcers  . Zetia [Ezetimibe] Other (See Comments)    Reaction: Leg pain    REVIEW OF SYSTEMS:   Review of Systems  Constitutional: Positive for malaise/fatigue. Negative for chills and fever.  HENT: Negative.  Negative for ear discharge, ear pain, hearing loss, nosebleeds and sore throat.   Eyes: Negative.  Negative for blurred vision and pain.  Respiratory: Negative.  Negative for cough, hemoptysis, shortness of breath and wheezing.   Cardiovascular: Negative.  Negative for chest pain, palpitations and leg swelling.  Gastrointestinal: Positive for abdominal pain, melena and nausea. Negative for  blood in stool, diarrhea and vomiting.  Genitourinary: Negative.  Negative for dysuria.  Musculoskeletal: Negative.  Negative for back pain.  Skin: Negative.   Neurological: Positive for weakness. Negative for dizziness, tremors, speech change, focal weakness, seizures and headaches.  Endo/Heme/Allergies: Negative.  Does not bruise/bleed easily.  Psychiatric/Behavioral: Negative.  Negative for depression, hallucinations and suicidal ideas.    MEDICATIONS AT  HOME:   Prior to Admission medications   Medication Sig Start Date End Date Taking? Authorizing Provider  acetaminophen (TYLENOL) 650 MG CR tablet Take 650 mg by mouth every 8 (eight) hours as needed for pain.   Yes Historical Provider, MD  albuterol (PROVENTIL HFA;VENTOLIN HFA) 108 (90 Base) MCG/ACT inhaler Inhale 2 puffs into the lungs every 4 (four) hours as needed for wheezing or shortness of breath.   Yes Historical Provider, MD  chlorthalidone (HYGROTON) 25 MG tablet Take 12.5 mg by mouth daily.  01/18/16  Yes Historical Provider, MD  Cholecalciferol (VITAMIN D PO) Take 1 tablet by mouth daily.   Yes Historical Provider, MD  COENZYME Q10 PO Take 1 tablet by mouth daily.   Yes Historical Provider, MD  colchicine 0.6 MG tablet Take 0.6 mg by mouth daily.  08/28/13  Yes Historical Provider, MD  Cyanocobalamin (B-12) 1000 MCG CAPS Take 2,000 Units by mouth 2 (two) times daily.   Yes Historical Provider, MD  cycloSPORINE (RESTASIS) 0.05 % ophthalmic emulsion Place 1 drop into both eyes 2 (two) times daily.   Yes Historical Provider, MD  fluticasone (FLOVENT HFA) 110 MCG/ACT inhaler Inhale 1 puff into the lungs 2 (two) times daily.  01/12/16 10/18/16 Yes Historical Provider, MD  gabapentin (NEURONTIN) 300 MG capsule Take 300 mg by mouth 2 (two) times daily.   Yes Historical Provider, MD  guaiFENesin (MUCINEX) 600 MG 12 hr tablet Take 600 mg by mouth 2 (two) times daily as needed.   Yes Historical Provider, MD  hydrocortisone (PROCTOSOL HC) 2.5 % rectal cream PLACE RECTALLY TWICE A DAY 10/13/15  Yes Historical Provider, MD  latanoprost (XALATAN) 0.005 % ophthalmic solution Apply 1 drop to eye at bedtime.  01/17/16 01/16/17 Yes Historical Provider, MD  loratadine (CLARITIN) 10 MG tablet Take 10 mg by mouth daily as needed for allergies.   Yes Historical Provider, MD  losartan (COZAAR) 50 MG tablet Take 50 mg by mouth daily.   Yes Historical Provider, MD  mometasone (ELOCON) 0.1 % lotion Apply topically daily.    Yes Historical Provider, MD  Multiple Vitamin (MULTIVITAMIN) tablet Take 1 tablet by mouth daily.   Yes Historical Provider, MD  Multiple Vitamins-Minerals (OCUVITE PRESERVISION PO) Take 1 tablet by mouth 2 (two) times daily.   Yes Historical Provider, MD  omeprazole (PRILOSEC) 20 MG capsule Take 20 mg by mouth 2 (two) times daily before a meal.   Yes Historical Provider, MD  PARoxetine (PAXIL) 40 MG tablet Take 40 mg by mouth every morning.   Yes Historical Provider, MD  Polyethyl Glycol-Propyl Glycol (SYSTANE) 0.4-0.3 % SOLN Apply 1 drop to eye as needed.   Yes Historical Provider, MD  potassium chloride (K-DUR,KLOR-CON) 10 MEQ tablet Take 10 mEq by mouth 2 (two) times daily.   Yes Historical Provider, MD  RABEprazole (ACIPHEX) 20 MG tablet Take 20 mg by mouth 2 (two) times daily.   Yes Historical Provider, MD  saccharomyces boulardii (FLORASTOR) 250 MG capsule Take 250 mg by mouth daily.   Yes Historical Provider, MD  sucralfate (CARAFATE) 1 g tablet Take 1 tablet (1 g total)  by mouth 4 (four) times daily -  with meals and at bedtime. 08/13/15  Yes Dustin Flock, MD  Tetrahydroz-Dextran-PEG-Povid (EYE DROPS ADVANCED RELIEF OP) Apply to eye.   Yes Historical Provider, MD  timolol (TIMOPTIC-XR) 0.5 % ophthalmic gel-forming Place 1 drop into both eyes daily.   Yes Historical Provider, MD  valACYclovir (VALTREX) 1000 MG tablet once daily as needed. Reported on 03/22/2015 01/18/13  Yes Historical Provider, MD  Vitamin D, Ergocalciferol, (DRISDOL) 50000 units CAPS capsule Take by mouth.   Yes Historical Provider, MD      VITAL SIGNS:  Blood pressure (!) 116/48, pulse 80, temperature 97.6 F (36.4 C), temperature source Oral, resp. rate 20, height 5\' 9"  (1.753 m), weight 83.9 kg (185 lb), SpO2 100 %.  PHYSICAL EXAMINATION:   Physical Exam  Constitutional: She is oriented to person, place, and time and well-developed, well-nourished, and in no distress. No distress.  HENT:  Head: Normocephalic.   Eyes: No scleral icterus.  Neck: Normal range of motion. Neck supple. No JVD present. No tracheal deviation present.  Cardiovascular: Normal rate, regular rhythm and normal heart sounds.  Exam reveals no gallop and no friction rub.   No murmur heard. Pulmonary/Chest: Effort normal and breath sounds normal. No respiratory distress. She has no wheezes. She has no rales. She exhibits no tenderness.  Abdominal: Soft. Bowel sounds are normal. She exhibits no distension and no mass. There is no tenderness. There is no rebound and no guarding.  Musculoskeletal: Normal range of motion. She exhibits no edema.  Neurological: She is alert and oriented to person, place, and time.  Skin: Skin is warm. No rash noted. No erythema.  Psychiatric: Affect and judgment normal.      LABORATORY PANEL:   CBC  Recent Labs Lab 04/12/16 1258  WBC 8.7  HGB 9.0*  HCT 27.4*  PLT 379   ------------------------------------------------------------------------------------------------------------------  Chemistries   Recent Labs Lab 04/12/16 1258  NA 137  K 3.2*  CL 104  CO2 24  GLUCOSE 123*  BUN 28*  CREATININE 0.63  CALCIUM 9.1   ------------------------------------------------------------------------------------------------------------------ Cardiac Enzymes No results for input(s): TROPONINI in the last 168 hours. ------------------------------------------------------------------------------------------------------------------  RADIOLOGY:  No results found.  EKG:   Normal sinus rhythm heart rate 81 no ST elevation or depression.  IMPRESSION AND PLAN:    83 year old female with a history of essential hypertension and chronic iron deficiency anemia with recent hospitalization for melanotic stools status post upper GI endoscopy without evidence of acute bleeding who presents again with melanotic stools.  1. Melanotic stools: Patient will need capsule endoscopy during this hospital stay,  likely. Request GI consultation. Monitor hemoglobin every 6 hours. Continue PPI IV twice a day  2. Essential hypertension: Continue losartan  3. Chronic iron deficiency anemia: Patient follows up with the cancer center and received IV transfusions there. If hemoglobin less than 7 or hemodynamically unstable patient will require blood transfusions. Hemoglobin today is better than discharge hemoglobin but this may be superficially elevated.   4. Anxiety/depression: Continue Paxil  5. History of gout on call for COLCHINE. 6. Hypokalemia: Replete and recheck in a.m.  All the records are reviewed and case discussed with ED provider. Management plans discussed with the patient and she is in agreement  CODE STATUS: DNR  TOTAL TIME TAKING CARE OF THIS PATIENT: 45 minutes.    Manasseh Pittsley M.D on 04/12/2016 at 2:03 PM  Between 7am to 6pm - Pager - [phone]  After 6pm go to [email or web address] - password  [email or web address]  [facility]  Office  [phone]  CC: Primary care physician; Arrie Aran PATRICIA, DO

## 2016-04-12 NOTE — ED Provider Notes (Signed)
[facility] Emergency Department Provider Note  ____________________________________________  Time seen: Approximately 1:45 PM  I have reviewed the triage vital signs and the nursing notes.   HISTORY  Chief Complaint Weakness   HPI [name] [name] is a 83 y.o. female with a history of recurrent upper GI bleeding, diverticulitis, anemia, hypertension, iron deficiency who presents for evaluation of melena. Patient was admitted last week for a upper GI bleed requiring a blood transfusion. She underwent an endoscopy which was unremarkable. She reports that for the last 5 days her stools had gone back to normal. This morning she woke up complaining of cramping epigastric abdominal pain and had 3 melanotic bowel movements. She reports that her pain resolves after each bowel movement. She denies pain at this time. She is not on any blood thinners. She reports that she started feeling fatigued and weak yesterday and was barely able to get up from the bed. She denies lightheadedness, chest pain or shortness of breath, nausea or vomiting.She denies NSAID use.  Past Medical History:  Diagnosis Date  . Anemia   . COPD (chronic obstructive pulmonary disease) ([place])   . Diverticulitis   . GERD (gastroesophageal reflux disease)   . Glaucoma   . HLD (hyperlipidemia)   . Hypertension   . Iron deficiency   . Rectal bleeding     Patient Active Problem List   Diagnosis Date Noted  . Melena   . Healthcare-associated pertussis 10/10/2015  . Healthcare-associated pneumonia 10/10/2015  . GI bleed 08/10/2015  . Iron deficiency anemia due to chronic blood loss 08/10/2015  . HTN (hypertension) 08/10/2015  . HLD (hyperlipidemia) 08/10/2015  . GERD (gastroesophageal reflux disease) 08/10/2015  . COPD (chronic obstructive pulmonary disease) ([place]) 08/10/2015  . Recurrent major depressive disorder, in partial remission ([place]) 07/10/2011  . Diverticulosis of colon 07/18/2010    . Gastric ulcer 07/18/2010    Past Surgical History:  Procedure Laterality Date  . ABDOMINAL HYSTERECTOMY    . APPENDECTOMY    . ESOPHAGOGASTRODUODENOSCOPY (EGD) WITH PROPOFOL N/A 08/12/2015   Procedure: ESOPHAGOGASTRODUODENOSCOPY (EGD) WITH PROPOFOL;  Surgeon: Lollie Sails, MD;  Location: [facility] ENDOSCOPY;  Service: Endoscopy;  Laterality: N/A;  . ESOPHAGOGASTRODUODENOSCOPY (EGD) WITH PROPOFOL N/A 04/03/2016   Procedure: ESOPHAGOGASTRODUODENOSCOPY (EGD) WITH PROPOFOL;  Surgeon: Lucilla Lame, MD;  Location: [facility] ENDOSCOPY;  Service: Endoscopy;  Laterality: N/A;  . JOINT REPLACEMENT      Prior to Admission medications   Medication Sig Start Date End Date Taking? Authorizing Provider  acetaminophen (TYLENOL) 650 MG CR tablet Take 650 mg by mouth every 8 (eight) hours as needed for pain.    Historical Provider, MD  albuterol (PROVENTIL HFA;VENTOLIN HFA) 108 (90 Base) MCG/ACT inhaler Inhale 2 puffs into the lungs every 4 (four) hours as needed for wheezing or shortness of breath.    Historical Provider, MD  chlorthalidone (HYGROTON) 25 MG tablet Take 12.5 mg by mouth daily.  01/18/16   Historical Provider, MD  Cholecalciferol (VITAMIN D PO) Take 1 tablet by mouth daily.    Historical Provider, MD  COENZYME Q10 PO Take 1 tablet by mouth daily.    Historical Provider, MD  colchicine 0.6 MG tablet Take 0.6 mg by mouth daily.  08/28/13   Historical Provider, MD  Cyanocobalamin (B-12) 1000 MCG CAPS Take 2,000 Units by mouth 2 (two) times daily.    Historical Provider, MD  cycloSPORINE (RESTASIS) 0.05 % ophthalmic emulsion Place 1 drop into both eyes 2 (two) times daily.  Historical Provider, MD  fluticasone (FLONASE) 50 MCG/ACT nasal spray USE TWO SPRAYS IN BOTH NOSTRILS ONCE DAILY 03/24/15   Historical Provider, MD  fluticasone (FLOVENT HFA) 110 MCG/ACT inhaler Inhale 1 puff into the lungs 2 (two) times daily.  01/12/16 10/18/16  Historical Provider, MD  gabapentin (NEURONTIN) 300 MG capsule Take 300  mg by mouth 2 (two) times daily.    Historical Provider, MD  guaiFENesin (MUCINEX) 600 MG 12 hr tablet Take 600 mg by mouth 2 (two) times daily as needed.    Historical Provider, MD  hydrocortisone (PROCTOSOL HC) 2.5 % rectal cream PLACE RECTALLY TWICE A DAY 10/13/15   Historical Provider, MD  latanoprost (XALATAN) 0.005 % ophthalmic solution Apply 1 drop to eye at bedtime.  01/17/16 01/16/17  Historical Provider, MD  loratadine (CLARITIN) 10 MG tablet Take 10 mg by mouth daily as needed for allergies.    Historical Provider, MD  losartan (COZAAR) 50 MG tablet Take 50 mg by mouth daily.    Historical Provider, MD  mometasone (ELOCON) 0.1 % lotion Apply topically daily.    Historical Provider, MD  Multiple Vitamin (MULTIVITAMIN) tablet Take 1 tablet by mouth daily.    Historical Provider, MD  Multiple Vitamins-Minerals (OCUVITE PRESERVISION PO) Take 1 tablet by mouth 2 (two) times daily.    Historical Provider, MD  mupirocin ointment (BACTROBAN) 2 % Place 1 application into the nose 2 (two) times daily.    Historical Provider, MD  omeprazole (PRILOSEC) 20 MG capsule Take 20 mg by mouth 2 (two) times daily before a meal.    Historical Provider, MD  PARoxetine (PAXIL) 40 MG tablet Take 40 mg by mouth every morning.    Historical Provider, MD  Polyethyl Glycol-Propyl Glycol (SYSTANE) 0.4-0.3 % SOLN Apply 1 drop to eye as needed.    Historical Provider, MD  potassium chloride (K-DUR,KLOR-CON) 10 MEQ tablet Take 10 mEq by mouth 2 (two) times daily.    Historical Provider, MD  RA KRILL OIL 500 MG CAPS Take 1 capsule by mouth.     Historical Provider, MD  RABEprazole (ACIPHEX) 20 MG tablet Take 20 mg by mouth 2 (two) times daily.    Historical Provider, MD  saccharomyces boulardii (FLORASTOR) 250 MG capsule Take 250 mg by mouth daily.    Historical Provider, MD  sucralfate (CARAFATE) 1 g tablet Take 1 tablet (1 g total) by mouth 4 (four) times daily -  with meals and at bedtime. 08/13/15   Dustin Flock, MD    Tetrahydroz-Dextran-PEG-Povid (EYE DROPS ADVANCED RELIEF OP) Apply to eye.    Historical Provider, MD  timolol (TIMOPTIC-XR) 0.5 % ophthalmic gel-forming Place 1 drop into both eyes daily.    Historical Provider, MD  valACYclovir (VALTREX) 1000 MG tablet once daily as needed. Reported on 03/22/2015 01/18/13   Historical Provider, MD  Vitamin D, Ergocalciferol, (DRISDOL) 50000 units CAPS capsule Take by mouth.    Historical Provider, MD    Allergies Codeine; Dextrans; Fentanyl; Lipitor [atorvastatin]; Mobic [meloxicam]; Niacin and related; Nsaids; Pravachol [pravastatin sodium]; Statins; Voltaren [diclofenac sodium]; and Zetia [ezetimibe]  Family History  Problem Relation Age of Onset  . CAD Mother   . CAD Father   . CAD Brother     Social History Social History  Substance Use Topics  . Smoking status: Former Smoker    Types: Cigarettes  . Smokeless tobacco: Never Used  . Alcohol use No    Review of Systems  Constitutional: Negative for fever. Eyes: Negative for visual changes. ENT:  Negative for sore throat. Neck: No neck pain  Cardiovascular: Negative for chest pain. Respiratory: Negative for shortness of breath. Gastrointestinal: + upper abdominal pain, and melena. No vomiting or diarrhea. Genitourinary: Negative for dysuria. Musculoskeletal: Negative for back pain. Skin: Negative for rash. Neurological: Negative for headaches, weakness or numbness. Psych: No SI or HI  ____________________________________________   PHYSICAL EXAM:  VITAL SIGNS: ED Triage Vitals  Enc Vitals Group     BP 04/12/16 1258 (!) 116/48     Pulse Rate 04/12/16 1258 80     Resp 04/12/16 1258 20     Temp 04/12/16 1258 97.6 F (36.4 C)     Temp Source 04/12/16 1258 Oral     SpO2 04/12/16 1258 100 %     Weight 04/12/16 1258 185 lb (83.9 kg)     Height 04/12/16 1258 5\' 9"  (1.753 m)     Head Circumference --      Peak Flow --      Pain Score 04/12/16 1257 2     Pain Loc --      Pain Edu?  --      Excl. in [place]? --     Constitutional: Alert and oriented. Well appearing and in no apparent distress. HEENT:      Head: Normocephalic and atraumatic.         Eyes: Conjunctivae are normal. Sclera is non-icteric. EOMI. PERRL      Mouth/Throat: Mucous membranes are moist.       Neck: Supple with no signs of meningismus. Cardiovascular: Regular rate and rhythm. No murmurs, gallops, or rubs. 2+ symmetrical distal pulses are present in all extremities. No JVD. Respiratory: Normal respiratory effort. Lungs are clear to auscultation bilaterally. No wheezes, crackles, or rhonchi.  Gastrointestinal: Soft, mild epigastric/ LUQ tenderness to palpation, and non distended with positive bowel sounds. No rebound or guarding. Genitourinary: No CVA tenderness.Rectal exam showing black stool grossly guaiac positive Musculoskeletal: Nontender with normal range of motion in all extremities. No edema, cyanosis, or erythema of extremities. Neurologic: Normal speech and language. Face is symmetric. Moving all extremities. No gross focal neurologic deficits are appreciated. Skin: Skin is warm, dry and intact. No rash noted. Psychiatric: Mood and affect are normal. Speech and behavior are normal.  ____________________________________________   LABS (all labs ordered are listed, but only abnormal results are displayed)  Labs Reviewed  BASIC METABOLIC PANEL - Abnormal; Notable for the following:       Result Value   Potassium 3.2 (*)    Glucose, Bld 123 (*)    BUN 28 (*)    All other components within normal limits  CBC WITH DIFFERENTIAL/PLATELET - Abnormal; Notable for the following:    RBC 3.24 (*)    Hemoglobin 9.0 (*)    HCT 27.4 (*)    RDW 23.0 (*)    All other components within normal limits  URINALYSIS, COMPLETE (UACMP) WITH MICROSCOPIC  CBG MONITORING, ED  TYPE AND SCREEN   ____________________________________________  EKG  ED ECG REPORT I, Rudene Re, the attending physician,  personally viewed and interpreted this ECG.  Normal sinus rhythm, rate of 81, normal intervals, normal axis, no ST elevations or depressions, flattening T wave in aVL. ____________________________________________  RADIOLOGY  none  ____________________________________________   PROCEDURES  Procedure(s) performed: None Procedures Critical Care performed:  None ____________________________________________   INITIAL IMPRESSION / ASSESSMENT AND PLAN / ED COURSE  83 y.o. female with a history of recurrent upper GI bleeding, diverticulitis, anemia, hypertension, iron deficiency  who presents for evaluation of melena since this morning in the setting of recent UGIB requiring blood transfusion. Patient is hemodynamically stable, rectal exam showing black stool grossly guaiac positive. Hemoglobin is stable at 9.0 however since patient is back actively bleeding will admit to trend hemoglobins. We'll start patient on protonic. Type and screen has been sent.     Pertinent labs & imaging results that were available during my care of the patient were reviewed by me and considered in my medical decision making (see chart for details).    ____________________________________________   FINAL CLINICAL IMPRESSION(S) / ED DIAGNOSES  Final diagnoses:  UGIB (upper gastrointestinal bleed)      NEW MEDICATIONS STARTED DURING THIS VISIT:  New Prescriptions   No medications on file     Note:  This document was prepared using Dragon voice recognition software and may include unintentional dictation errors.    Rudene Re, MD 04/12/16 [phone]

## 2016-04-12 NOTE — ED Triage Notes (Signed)
Patient presents to ER with weakness for several days and three black stools at home today. Nausea with no vomiting. Reports she just got d/c from ER Tuesday 3/27 after being admitted for GI bleed. Patient is A&O x3. Takes NO blood thinners.

## 2016-04-12 NOTE — Progress Notes (Signed)
Family Meeting Note  Advance Directive:yes  Today a meeting took place with the Patient.and son in law     The following clinical team members were present during this meeting:MD  The following were discussed:Patient's diagnosis: melena HTN  Patient's progosis: Unable to determine and Goals for treatment: DNR  Additional follow-up to be provided:  Patient has advanced directives, POA and living will set up  Time spent during discussion:16 minutes  Jaydalee Bardwell, MD

## 2016-04-13 DIAGNOSIS — K922 Gastrointestinal hemorrhage, unspecified: Secondary | ICD-10-CM

## 2016-04-13 LAB — HEMOGLOBIN AND HEMATOCRIT, BLOOD
HCT: 23.3 % — ABNORMAL LOW (ref 35.0–47.0)
HCT: 26 % — ABNORMAL LOW (ref 35.0–47.0)
HEMATOCRIT: 25 % — AB (ref 35.0–47.0)
Hemoglobin: 7.7 g/dL — ABNORMAL LOW (ref 12.0–16.0)
Hemoglobin: 8.2 g/dL — ABNORMAL LOW (ref 12.0–16.0)
Hemoglobin: 8.6 g/dL — ABNORMAL LOW (ref 12.0–16.0)

## 2016-04-13 LAB — BASIC METABOLIC PANEL
ANION GAP: 7 (ref 5–15)
BUN: 21 mg/dL — AB (ref 6–20)
CHLORIDE: 106 mmol/L (ref 101–111)
CO2: 27 mmol/L (ref 22–32)
CREATININE: 0.64 mg/dL (ref 0.44–1.00)
Calcium: 8.8 mg/dL — ABNORMAL LOW (ref 8.9–10.3)
GFR calc non Af Amer: >60 mL/min (ref >60)
Glucose, Bld: 92 mg/dL (ref 65–99)
Potassium: 3.4 mmol/L — ABNORMAL LOW (ref 3.5–5.1)
Sodium: 140 mmol/L (ref 135–145)

## 2016-04-13 LAB — CBC
HEMATOCRIT: 26.2 % — AB (ref 35.0–47.0)
Hemoglobin: 9 g/dL — ABNORMAL LOW (ref 12.0–16.0)
MCH: 29.5 pg (ref 26.0–34.0)
MCHC: 34.3 g/dL (ref 32.0–36.0)
MCV: 85.9 fL (ref 80.0–100.0)
Platelets: 361 10*3/uL (ref 150–440)
RBC: 3.05 MIL/uL — ABNORMAL LOW (ref 3.80–5.20)
RDW: 22.5 % — AB (ref 11.5–14.5)
WBC: 7.9 10*3/uL (ref 3.6–11.0)

## 2016-04-13 MED ORDER — POLYETHYLENE GLYCOL 3350 17 GM/SCOOP PO POWD
1.0000 | Freq: Once | ORAL | Status: AC
  Administered 2016-04-13: 255 g via ORAL
  Filled 2016-04-13: qty 255

## 2016-04-13 NOTE — NC FL2 (Signed)
[place] LEVEL OF CARE SCREENING TOOL     IDENTIFICATION  Patient Name: [name] [name] Birthdate: 1925-08-04 Sex: female Admission Date (Current Location): 04/12/2016  [place] and [place] Number:  Engineering geologist and Address:  [facility], [address] [place], [place] [address]      Provider Number: [id number]  Attending Physician Name and Address:  Max Sane, MD  Relative Name and Phone Number:       Current Level of Care: Hospital Recommended Level of Care: [place] Prior Approval Number:    Date Approved/Denied:   PASRR Number:  [id number] )  Discharge Plan: SNF    Current Diagnoses: Patient Active Problem List   Diagnosis Date Noted  . UGIB (upper gastrointestinal bleed)   . GIB (gastrointestinal bleeding) 04/12/2016  . Melena   . Healthcare-associated pertussis 10/10/2015  . Healthcare-associated pneumonia 10/10/2015  . GI bleed 08/10/2015  . Iron deficiency anemia due to chronic blood loss 08/10/2015  . HTN (hypertension) 08/10/2015  . HLD (hyperlipidemia) 08/10/2015  . GERD (gastroesophageal reflux disease) 08/10/2015  . COPD (chronic obstructive pulmonary disease) ([place]) 08/10/2015  . Recurrent major depressive disorder, in partial remission ([place]) 07/10/2011  . Diverticulosis of colon 07/18/2010  . Gastric ulcer 07/18/2010    Orientation RESPIRATION BLADDER Height & Weight     Self, Time, Situation, Place  Normal Continent Weight: 185 lb (83.9 kg) Height:  5\' 9"  (175.3 cm)  BEHAVIORAL SYMPTOMS/MOOD NEUROLOGICAL BOWEL NUTRITION STATUS   (none)  (none) Continent Diet (Diet: clear liquid to be advanced. )  AMBULATORY STATUS COMMUNICATION OF NEEDS Skin   Extensive Assist Verbally Normal                       Personal Care Assistance Level of Assistance  Bathing, Feeding, Dressing Bathing Assistance: Limited assistance Feeding assistance: Independent Dressing Assistance:  Limited assistance     Functional Limitations Info  Sight, Hearing, Speech Sight Info: Adequate Hearing Info: Impaired Speech Info: Adequate    SPECIAL CARE FACTORS FREQUENCY  PT (By licensed PT), OT (By licensed OT)     PT Frequency:  (5) OT Frequency:  (5)            Contractures      Additional Factors Info  Code Status, Allergies Code Status Info:  (Full Code. ) Allergies Info:  (Codeine, Dextrans, Fentanyl, Lipitor Atorvastatin, Mobic Meloxicam, Niacin And Related, Nsaids, Pravachol Pravastatin Sodium, Statins, Voltaren Diclofenac Sodium, Zetia Ezetimibe)           Current Medications (04/13/2016):  This is the current hospital active medication list Current Facility-Administered Medications  Medication Dose Route Frequency Provider Last Rate Last Dose  . 0.9 %  sodium chloride infusion   Intravenous Continuous Bettey Costa, MD 75 mL/hr at 04/13/16 0846    . acetaminophen (TYLENOL) tablet 650 mg  650 mg Oral Q6H PRN Bettey Costa, MD       Or  . acetaminophen (TYLENOL) suppository 650 mg  650 mg Rectal Q6H PRN Sital Mody, MD      . albuterol (PROVENTIL) (2.5 MG/3ML) 0.083% nebulizer solution 3 mL  3 mL Inhalation Q4H PRN Bettey Costa, MD      . bisacodyl (DULCOLAX) EC tablet 5 mg  5 mg Oral Daily PRN Bettey Costa, MD      . budesonide (PULMICORT) nebulizer solution 0.25 mg  0.25 mg Nebulization BID Bettey Costa, MD   0.25 mg at 04/13/16 0742  .  colchicine tablet 0.6 mg  0.6 mg Oral Daily Bettey Costa, MD   0.6 mg at 04/13/16 0954  . cycloSPORINE (RESTASIS) 0.05 % ophthalmic emulsion 1 drop  1 drop Both Eyes BID Bettey Costa, MD   1 drop at 04/13/16 0954  . gabapentin (NEURONTIN) capsule 300 mg  300 mg Oral BID Bettey Costa, MD   300 mg at 04/13/16 0954  . HYDROcodone-acetaminophen (NORCO/VICODIN) 5-325 MG per tablet 1-2 tablet  1-2 tablet Oral Q4H PRN Sital Mody, MD      . latanoprost (XALATAN) 0.005 % ophthalmic solution 1 drop  1 drop Both Eyes QPC breakfast Bettey Costa, MD   1 drop  at 04/13/16 0833  . loratadine (CLARITIN) tablet 10 mg  10 mg Oral Daily PRN Bettey Costa, MD      . losartan (COZAAR) tablet 50 mg  50 mg Oral Daily Bettey Costa, MD   50 mg at 04/13/16 0954  . ondansetron (ZOFRAN) tablet 4 mg  4 mg Oral Q6H PRN Bettey Costa, MD       Or  . ondansetron (ZOFRAN) injection 4 mg  4 mg Intravenous Q6H PRN Sital Mody, MD      . pantoprazole (PROTONIX) 80 mg in sodium chloride 0.9 % 250 mL (0.32 mg/mL) infusion  8 mg/hr Intravenous Continuous Rudene Re, MD 25 mL/hr at 04/13/16 1437 8 mg/hr at 04/13/16 1437  . [START ON 04/16/2016] pantoprazole (PROTONIX) injection 40 mg  40 mg Intravenous Q12H Rudene Re, MD      . PARoxetine (PAXIL) tablet 40 mg  40 mg Oral BH-q7a Bettey Costa, MD      . senna-docusate (Senokot-S) tablet 1 tablet  1 tablet Oral QHS PRN Bettey Costa, MD      . sucralfate (CARAFATE) tablet 1 g  1 g Oral TID WC & HS Bettey Costa, MD   Stopped at 04/13/16 1200  . timolol (TIMOPTIC-XR) 0.5 % ophthalmic gel-forming 1 drop  1 drop Both Eyes QHS Bettey Costa, MD   1 drop at 04/12/16 2102  . triamcinolone cream (KENALOG) 0.1 %   Topical Daily Bettey Costa, MD   Stopped at 04/12/16 1900     Discharge Medications: Please see discharge summary for a list of discharge medications.  Relevant Imaging Results:  Relevant Lab Results:   Additional Information  (SSN: [id number])  Nakiah Osgood, Veronia Beets, LCSW

## 2016-04-13 NOTE — Progress Notes (Signed)
[place] [facility] [place] NAME: [name] [name]    MR#:  [id number]  DATE OF BIRTH:  10-22-1925  SUBJECTIVE:  Hemoglobin 8.2, not having any further bleeding, family at bedside REVIEW OF SYSTEMS:    Review of Systems  Constitutional: Negative for chills and fever.  HENT: Negative for congestion and tinnitus.   Eyes: Negative for blurred vision and double vision.  Respiratory: Negative for cough, shortness of breath and wheezing.   Cardiovascular: Negative for chest pain, orthopnea and PND.  Gastrointestinal: Positive for melena. Negative for abdominal pain, diarrhea, nausea and vomiting.  Genitourinary: Negative for dysuria and hematuria.  Neurological: Positive for weakness. Negative for dizziness, sensory change and focal weakness.  All other systems reviewed and are negative.   Nutrition: Clear liquid diet   DRUG ALLERGIES:   Allergies  Allergen Reactions  . Codeine Nausea And Vomiting  . Dextrans Hives  . Fentanyl Itching  . Lipitor [Atorvastatin] Other (See Comments)    Reaction: Muscle pain  . Mobic [Meloxicam] Other (See Comments)    Reaction: Mouth and tongue ulcers  . Niacin And Related Itching  . Nsaids Hives  . Pravachol [Pravastatin Sodium] Other (See Comments)    Reaction: Muscle pain  . Statins   . Voltaren [Diclofenac Sodium] Other (See Comments)    Reaction: Mouth and tongue ulcers  . Zetia [Ezetimibe] Other (See Comments)    Reaction: Leg pain    VITALS:  Blood pressure (!) 142/56, pulse 73, temperature 97.5 F (36.4 C), temperature source Oral, resp. rate 20, height 5\' 9"  (1.753 m), weight 83.9 kg (185 lb), SpO2 96 %. PHYSICAL EXAMINATION:   Physical Exam  GENERAL:  83 y.o.-year-old patient lying in bed in no acute distress.  EYES: Pupils equal, round, reactive to light and accommodation. No scleral icterus. Extraocular muscles intact.  HEENT: Head atraumatic, normocephalic. Oropharynx and nasopharynx clear.   NECK:  Supple, no jugular venous distention. No thyroid enlargement, no tenderness.  LUNGS: Normal breath sounds bilaterally, no wheezing, rales, rhonchi. No use of accessory muscles of respiration.  CARDIOVASCULAR: S1, S2 normal. II/VI SEM at LSB, No rubs, or gallops.  ABDOMEN: Soft, nontender, nondistended. Bowel sounds present. No organomegaly or mass.  EXTREMITIES: No cyanosis, clubbing or edema b/l.    NEUROLOGIC: Cranial nerves II through XII are intact. No focal Motor or sensory deficits b/l.   PSYCHIATRIC: The patient is alert and oriented x 3.  SKIN: No obvious rash, lesion, or ulcer.  LABORATORY PANEL:   CBC  Recent Labs Lab 04/13/16 0456 04/13/16 1103  WBC 7.9  --   HGB 9.0* 8.2*  HCT 26.2* 25.0*  PLT 361  --    ------------------------------------------------------------------------------------------------------------------  Chemistries   Recent Labs Lab 04/13/16 0456  NA 140  K 3.4*  CL 106  CO2 27  GLUCOSE 92  BUN 21*  CREATININE 0.64  CALCIUM 8.8*   ------------------------------------------------------------------------------------------------------------------   ASSESSMENT AND PLAN:  83 year old female with a history of essential hypertension and chronic iron deficiency anemia with recent hospitalization for melanotic stools status post upper GI endoscopy without evidence of acute bleeding who presents again with melanotic stools.  1. Melena -suspected to be Lower GI bleed given the patient's melanotic stools. -Hemoglobin 8.2.  - Possible colonoscopy tomorrow as per GI.  -continue Protonix IV twice a day  2.  Hypokalemia -Replete and recheck   3. Chronic iron deficiency anemia: Patient follows up with the cancer center and received IV transfusions there. If  hemoglobin less than 7 or hemodynamically unstable patient will require blood transfusions.  4. Anxiety/depression: Continue Paxil  5. History of gout: Continue COLCHINE.  6. HTN -  cont. Losartan  7. Neuropathy - cont. Neurontin.   8. Glaucoma - cont. Timolol.     All the records are reviewed and case discussed with Care Management/Social Worker. Management plans discussed with the patient, family, Dr Allen Norris and they are in agreement.  CODE STATUS: Limited Code  DVT Prophylaxis: Ted's & SCD's.   TOTAL TIME TAKING CARE OF THIS PATIENT: 15 minutes.   POSSIBLE D/C IN 2-3 DAYS, DEPENDING ON CLINICAL CONDITION. And GI evaluation   Max Sane M.D on 04/13/2016 at 1:56 PM  Between 7am to 6pm - Pager - [phone]  After 6pm go to [email or web address] - Technical brewer [place] [facility]  Office  [phone]  CC: Primary care physician; Arrie Aran PATRICIA, DO

## 2016-04-13 NOTE — Evaluation (Signed)
Physical Therapy Evaluation Patient Details Name: [name] [name] MRN: [id number] DOB: Jul 18, 1925 Today's Date: 04/13/2016   History of Present Illness  Pt admitted for GI bleed with complaints of weakness. Pt with recent admission for similar symptoms. Pt with history of GI bleed, COPD, GERD, and HTN.  Pt with plans for colonoscopy next date.  Clinical Impression  Pt is a pleasant 83 year old female who was admitted for GI bleed. Pt performs transfers and ambulation with mod I and SPC. Pt demonstrates all bed mobility/transfers/ambulation at baseline level. Pt reports she is feeling back to normal. Pt does not require any further PT needs at this time. Pt will be dc in house and does not require follow up. RN aware. Will dc current orders.      Follow Up Recommendations No PT follow up    Equipment Recommendations  None recommended by PT    Recommendations for Other Services       Precautions / Restrictions Precautions Precautions: Fall Restrictions Weight Bearing Restrictions: No      Mobility  Bed Mobility               General bed mobility comments: not tested as pt received sitting at EOB upon arrival  Transfers Overall transfer level: Modified independent Equipment used: Straight cane             General transfer comment: Pt able to rise confidently and w/o safety concerns  Ambulation/Gait Ambulation/Gait assistance: Modified independent (Device/Increase time) Ambulation Distance (Feet): 200 Feet Assistive device: Straight cane Gait Pattern/deviations: WFL(Within Functional Limits)     General Gait Details: ambulated around RN station with safe technique and cautious gait pattern. No formal LOB noted. All mobility performed on RA with no complaints of SOB.  Stairs            Wheelchair Mobility    Modified Rankin (Stroke Patients Only)       Balance Overall balance assessment: Modified Independent                                            Pertinent Vitals/Pain Pain Assessment: No/denies pain    Home Living Family/patient expects to be discharged to:: Assisted living               Home Equipment: [name] - single point      Prior Function Level of Independence: Independent with assistive device(s)         Comments: no longer drives-has transportation [facility] as well as aide that can transport. No recent falls reported     Hand Dominance        Extremity/Trunk Assessment   Upper Extremity Assessment Upper Extremity Assessment: Overall WFL for tasks assessed    Lower Extremity Assessment Lower Extremity Assessment: Overall WFL for tasks assessed       Communication   Communication: No difficulties;HOH  Cognition Arousal/Alertness: Awake/alert Behavior During Therapy: WFL for tasks assessed/performed Overall Cognitive Status: Within Functional Limits for tasks assessed                                        General Comments      Exercises     Assessment/Plan    PT Assessment Patent does not need any further PT services  PT Problem List         PT Treatment Interventions      PT Goals (Current goals can be found in the Care Plan section)  Acute Rehab PT Goals Patient Stated Goal: go home PT Goal Formulation: With patient Time For Goal Achievement: 04/13/16 Potential to Achieve Goals: Good    Frequency     Barriers to discharge        Co-evaluation               End of Session Equipment Utilized During Treatment: Gait belt Activity Tolerance: Patient tolerated treatment well Patient left: with bed alarm set;with call bell/phone within reach Nurse Communication: Mobility status PT Visit Diagnosis: Muscle weakness (generalized) (M62.81)    Time: 0177-9390 PT Time Calculation (min) (ACUTE ONLY): 12 min   Charges:   PT Evaluation $PT Eval Low Complexity: 1 Procedure     PT G CodesGreggory Stallion, PT,  DPT [phone]   [name] [name] 04/13/2016, 4:35 PM

## 2016-04-13 NOTE — Consult Note (Addendum)
Lucilla Lame, MD [address] [place], [place] [address] Phone: [phone] Fax : [phone]  Consultation  Referring Provider:     Dr. Benjie Karvonen Primary Care Physician:  Gearldine Shown, DO Primary Gastroenterologist:  Dr. Gustavo Lah         Reason for Consultation:     GI bleed  Date of Admission:  04/12/2016 Date of Consultation:  04/13/2016         HPI:   [name] [name] is a 83 y.o. female who has had an admission for anemia with a GI bleed. The patient states she had black stools yesterday but has not had any bowel movement since. The patient had an upper endoscopy on 04/03/2016 just the few weeks ago that showed no sign of any blood in the stomach or sign of recent GI bleeding. The patient also had an upper endoscopy back in August 2017 that did not show any source of bleeding at that time. The patient also reports that she has lower abdominal pain that was not present before. The patient states she had a CT colonography 5 years ago after a colonoscopy could not be done because of diverticulosis. There is no report of any unexplained weight loss, fevers, chills, nausea or vomiting.  Past Medical History:  Diagnosis Date  . Anemia   . COPD (chronic obstructive pulmonary disease) ([place])   . Diverticulitis   . GERD (gastroesophageal reflux disease)   . Glaucoma   . HLD (hyperlipidemia)   . Hypertension   . Iron deficiency   . Rectal bleeding     Past Surgical History:  Procedure Laterality Date  . ABDOMINAL HYSTERECTOMY    . APPENDECTOMY    . ESOPHAGOGASTRODUODENOSCOPY (EGD) WITH PROPOFOL N/A 08/12/2015   Procedure: ESOPHAGOGASTRODUODENOSCOPY (EGD) WITH PROPOFOL;  Surgeon: Lollie Sails, MD;  Location: [facility] ENDOSCOPY;  Service: Endoscopy;  Laterality: N/A;  . ESOPHAGOGASTRODUODENOSCOPY (EGD) WITH PROPOFOL N/A 04/03/2016   Procedure: ESOPHAGOGASTRODUODENOSCOPY (EGD) WITH PROPOFOL;  Surgeon: Lucilla Lame, MD;  Location: [facility] ENDOSCOPY;  Service:  Endoscopy;  Laterality: N/A;  . JOINT REPLACEMENT      Prior to Admission medications   Medication Sig Start Date End Date Taking? Authorizing Provider  acetaminophen (TYLENOL) 650 MG CR tablet Take 650 mg by mouth every 8 (eight) hours as needed for pain.   Yes Historical Provider, MD  albuterol (PROVENTIL HFA;VENTOLIN HFA) 108 (90 Base) MCG/ACT inhaler Inhale 2 puffs into the lungs every 4 (four) hours as needed for wheezing or shortness of breath.   Yes Historical Provider, MD  chlorthalidone (HYGROTON) 25 MG tablet Take 12.5 mg by mouth daily.  01/18/16  Yes Historical Provider, MD  Cholecalciferol (VITAMIN D PO) Take 1 tablet by mouth daily.   Yes Historical Provider, MD  COENZYME Q10 PO Take 1 tablet by mouth daily.   Yes Historical Provider, MD  colchicine 0.6 MG tablet Take 0.6 mg by mouth daily.  08/28/13  Yes Historical Provider, MD  Cyanocobalamin (B-12) 1000 MCG CAPS Take 2,000 Units by mouth 2 (two) times daily.   Yes Historical Provider, MD  cycloSPORINE (RESTASIS) 0.05 % ophthalmic emulsion Place 1 drop into both eyes 2 (two) times daily.   Yes Historical Provider, MD  fluticasone (FLOVENT HFA) 110 MCG/ACT inhaler Inhale 1 puff into the lungs 2 (two) times daily.  01/12/16 10/18/16 Yes Historical Provider, MD  gabapentin (NEURONTIN) 300 MG capsule Take 300 mg by mouth 2 (two) times daily.   Yes Historical Provider, MD  guaiFENesin [facility])  600 MG 12 hr tablet Take 600 mg by mouth 2 (two) times daily as needed.   Yes Historical Provider, MD  hydrocortisone (PROCTOSOL HC) 2.5 % rectal cream PLACE RECTALLY TWICE A DAY 10/13/15  Yes Historical Provider, MD  latanoprost (XALATAN) 0.005 % ophthalmic solution Apply 1 drop to eye at bedtime.  01/17/16 01/16/17 Yes Historical Provider, MD  loratadine (CLARITIN) 10 MG tablet Take 10 mg by mouth daily as needed for allergies.   Yes Historical Provider, MD  losartan (COZAAR) 50 MG tablet Take 50 mg by mouth daily.   Yes Historical Provider, MD    Multiple Vitamin (MULTIVITAMIN) tablet Take 1 tablet by mouth daily.   Yes Historical Provider, MD  Multiple Vitamins-Minerals (OCUVITE PRESERVISION PO) Take 1 tablet by mouth 2 (two) times daily.   Yes Historical Provider, MD  omeprazole (PRILOSEC) 20 MG capsule Take 20 mg by mouth 2 (two) times daily before a meal.   Yes Historical Provider, MD  PARoxetine (PAXIL) 40 MG tablet Take 40 mg by mouth every morning.   Yes Historical Provider, MD  Polyethyl Glycol-Propyl Glycol (SYSTANE) 0.4-0.3 % SOLN Apply 1 drop to eye as needed.   Yes Historical Provider, MD  potassium chloride (K-DUR,KLOR-CON) 10 MEQ tablet Take 10 mEq by mouth 2 (two) times daily.   Yes Historical Provider, MD  RABEprazole (ACIPHEX) 20 MG tablet Take 20 mg by mouth 2 (two) times daily.   Yes Historical Provider, MD  saccharomyces boulardii (FLORASTOR) 250 MG capsule Take 250 mg by mouth daily.   Yes Historical Provider, MD  sucralfate (CARAFATE) 1 g tablet Take 1 tablet (1 g total) by mouth 4 (four) times daily -  with meals and at bedtime. 08/13/15  Yes Dustin Flock, MD  Tetrahydroz-Dextran-PEG-Povid (EYE DROPS ADVANCED RELIEF OP) Apply to eye.   Yes Historical Provider, MD  timolol (TIMOPTIC-XR) 0.5 % ophthalmic gel-forming Place 1 drop into both eyes daily.   Yes Historical Provider, MD  valACYclovir (VALTREX) 1000 MG tablet once daily as needed. Reported on 03/22/2015 01/18/13  Yes Historical Provider, MD  Vitamin D, Ergocalciferol, (DRISDOL) 50000 units CAPS capsule Take by mouth.   Yes Historical Provider, MD    Family History  Problem Relation Age of Onset  . CAD Mother   . CAD Father   . CAD Brother      Social History  Substance Use Topics  . Smoking status: Former Smoker    Types: Cigarettes  . Smokeless tobacco: Never Used  . Alcohol use No    Allergies as of 04/12/2016 - Review Complete 04/12/2016  Allergen Reaction Noted  . Codeine Nausea And Vomiting 08/10/2015  . Dextrans Hives 08/10/2015  .  Fentanyl Itching 08/10/2015  . Lipitor [atorvastatin] Other (See Comments) 08/10/2015  . Mobic [meloxicam] Other (See Comments) 08/10/2015  . Niacin and related Itching 08/10/2015  . Nsaids Hives 08/10/2015  . Pravachol [pravastatin sodium] Other (See Comments) 08/10/2015  . Statins  03/31/2016  . Voltaren [diclofenac sodium] Other (See Comments) 08/10/2015  . Zetia [ezetimibe] Other (See Comments) 08/10/2015    Review of Systems:    All systems reviewed and negative except where noted in HPI.   Physical Exam:  Vital signs in last 24 hours: Temp:  [97.6 F (36.4 C)-98.3 F (36.8 C)] 97.9 F (36.6 C) (04/05 0811) Pulse Rate:  [78-85] 78 (04/05 0811) Resp:  [14-21] 18 (04/05 0811) BP: (116-154)/(48-58) 154/58 (04/05 0811) SpO2:  [95 %-100 %] 98 % (04/05 0811) Weight:  [185 lb (83.9 kg)]  185 lb (83.9 kg) (04/04 1258) Last BM Date: 04/12/16 General:   Pleasant, cooperative in NAD Head:  Normocephalic and atraumatic. Eyes:   No icterus.   Conjunctiva pink. PERRLA. Ears:  Normal auditory acuity. Neck:  Supple; no masses or thyroidomegaly Lungs: Respirations even and unlabored. Lungs clear to auscultation bilaterally.   No wheezes, crackles, or rhonchi.  Heart:  Regular rate and rhythm;  Without murmur, clicks, rubs or gallops Abdomen:  Soft, nondistended, nontender. Normal bowel sounds. No appreciable masses or hepatomegaly.  No rebound or guarding.  Rectal:  Not performed. Msk:  Symmetrical without gross deformities.  Strength  Extremities:  Without edema, cyanosis or clubbing. Neurologic:  Alert and oriented x3;  grossly normal neurologically. Skin:  Intact without significant lesions or rashes. Cervical Nodes:  No significant cervical adenopathy. Psych:  Alert and cooperative. Normal affect.  LAB RESULTS:  Recent Labs  04/12/16 1258 04/12/16 1745 04/13/16 0015 04/13/16 0456  WBC 8.7  --   --  7.9  HGB 9.0* 8.2* 7.7* 9.0*  HCT 27.4* 25.1* 23.3* 26.2*  PLT 379  --   --   361   BMET  Recent Labs  04/12/16 1258 04/13/16 0456  NA 137 140  K 3.2* 3.4*  CL 104 106  CO2 24 27  GLUCOSE 123* 92  BUN 28* 21*  CREATININE 0.63 0.64  CALCIUM 9.1 8.8*   LFT No results for input(s): PROT, ALBUMIN, AST, ALT, ALKPHOS, BILITOT, BILIDIR, IBILI in the last 72 hours. PT/INR No results for input(s): LABPROT, INR in the last 72 hours.  STUDIES: No results found.    Impression / Plan:   Lovene Maret is a 83 y.o. y/o female with A history of dark stools that she states is between black and maroon. The patient has had multiple EGDs without any active or a source of bleeding seen. The patient will be set up for colonoscopy due to her GI bleeding and lower abdominal pain. If the colonoscopy is negative she may need a capsule endoscopy. The patient and her son-in-law have been explained the plan and agree with it.  Thank you for involving me in the care of this patient.      LOS: 1 day   Lucilla Lame, MD  04/13/2016, 10:34 AM   Note: This dictation was prepared with Dragon dictation along with smaller phrase technology. Any transcriptional errors that result from this process are unintentional.

## 2016-04-14 ENCOUNTER — Encounter: Payer: Self-pay | Admitting: Anesthesiology

## 2016-04-14 ENCOUNTER — Inpatient Hospital Stay: Payer: Medicare Other | Admitting: Certified Registered Nurse Anesthetist

## 2016-04-14 ENCOUNTER — Encounter
Admission: EM | Disposition: A | Discharge status: Home / Self Care | Payer: Self-pay | Source: Home / Self Care | Attending: Internal Medicine

## 2016-04-14 DIAGNOSIS — D122 Benign neoplasm of ascending colon: Principal | ICD-10-CM

## 2016-04-14 DIAGNOSIS — K921 Melena: Secondary | ICD-10-CM

## 2016-04-14 HISTORY — PX: COLONOSCOPY WITH PROPOFOL: SHX5780

## 2016-04-14 LAB — BASIC METABOLIC PANEL
Anion gap: 6 (ref 5–15)
BUN: 11 mg/dL (ref 6–20)
CHLORIDE: 109 mmol/L (ref 101–111)
CO2: 24 mmol/L (ref 22–32)
CREATININE: 0.6 mg/dL (ref 0.44–1.00)
Calcium: 8.3 mg/dL — ABNORMAL LOW (ref 8.9–10.3)
GFR calc Af Amer: >60 mL/min (ref >60)
GFR calc non Af Amer: >60 mL/min (ref >60)
Glucose, Bld: 95 mg/dL (ref 65–99)
Potassium: 2.8 mmol/L — ABNORMAL LOW (ref 3.5–5.1)
SODIUM: 139 mmol/L (ref 135–145)

## 2016-04-14 LAB — POTASSIUM: Potassium: 3.3 mmol/L — ABNORMAL LOW (ref 3.5–5.1)

## 2016-04-14 LAB — CBC
HCT: 23.3 % — ABNORMAL LOW (ref 35.0–47.0)
HEMOGLOBIN: 7.6 g/dL — AB (ref 12.0–16.0)
MCH: 27.7 pg (ref 26.0–34.0)
MCHC: 32.8 g/dL (ref 32.0–36.0)
MCV: 84.6 fL (ref 80.0–100.0)
Platelets: 301 10*3/uL (ref 150–440)
RBC: 2.76 MIL/uL — ABNORMAL LOW (ref 3.80–5.20)
RDW: 22.6 % — AB (ref 11.5–14.5)
WBC: 4.9 10*3/uL (ref 3.6–11.0)

## 2016-04-14 LAB — MAGNESIUM: MAGNESIUM: 1.9 mg/dL (ref 1.7–2.4)

## 2016-04-14 SURGERY — COLONOSCOPY WITH PROPOFOL
Anesthesia: General

## 2016-04-14 MED ORDER — SODIUM CHLORIDE 0.9 % IV SOLN
30.0000 meq | INTRAVENOUS | Status: AC
  Administered 2016-04-14: 30 meq via INTRAVENOUS
  Filled 2016-04-14: qty 15

## 2016-04-14 MED ORDER — POTASSIUM CHLORIDE IN NACL 40-0.9 MEQ/L-% IV SOLN
INTRAVENOUS | Status: DC
Start: 2016-04-14 — End: 2016-04-14
  Administered 2016-04-14: 50 mL/h via INTRAVENOUS
  Filled 2016-04-14 (×2): qty 1000

## 2016-04-14 MED ORDER — PROPOFOL 500 MG/50ML IV EMUL
INTRAVENOUS | Status: AC
  Filled 2016-04-14: qty 50

## 2016-04-14 MED ORDER — SODIUM CHLORIDE 0.9 % IV SOLN
INTRAVENOUS | Status: DC
  Administered 2016-04-14: 15:00:00 via INTRAVENOUS

## 2016-04-14 MED ORDER — PROPOFOL 500 MG/50ML IV EMUL
INTRAVENOUS | Status: DC | PRN
  Administered 2016-04-14: 120 ug/kg/min via INTRAVENOUS

## 2016-04-14 MED ORDER — PANTOPRAZOLE SODIUM 40 MG PO TBEC
40.0000 mg | DELAYED_RELEASE_TABLET | Freq: Every day | ORAL | Status: DC
  Administered 2016-04-14 – 2016-04-15 (×2): 40 mg via ORAL
  Filled 2016-04-14 (×2): qty 1

## 2016-04-14 MED ORDER — LIDOCAINE HCL (PF) 2 % IJ SOLN
INTRAMUSCULAR | Status: AC
  Filled 2016-04-14: qty 2

## 2016-04-14 MED ORDER — POTASSIUM CHLORIDE CRYS ER 20 MEQ PO TBCR
40.0000 meq | EXTENDED_RELEASE_TABLET | Freq: Once | ORAL | Status: AC
  Administered 2016-04-14: 40 meq via ORAL
  Filled 2016-04-14: qty 2

## 2016-04-14 MED ORDER — PROPOFOL 10 MG/ML IV BOLUS
INTRAVENOUS | Status: DC | PRN
  Administered 2016-04-14: 50 mg via INTRAVENOUS

## 2016-04-14 NOTE — Clinical Social Work Note (Signed)
CSW consulted for New. PT is not recommending any follow up. RNCM is following for discharge planning needs. CSW is signing off as no further needs identified.   Darden Dates, MSW, LCSW  Clinical Social Worker  [phone]

## 2016-04-14 NOTE — Transfer of Care (Signed)
Immediate Anesthesia Transfer of Care Note  Patient: [name] [name]  Procedure(s) Performed: Procedure(s): COLONOSCOPY WITH PROPOFOL (N/A)  Patient Location: PACU  Anesthesia Type:General  Level of Consciousness: awake and alert   Airway & Oxygen Therapy: Patient Spontanous Breathing and Patient connected to nasal cannula oxygen  Post-op Assessment: Report given to RN and Post -op Vital signs reviewed and stable  Post vital signs: Reviewed and stable  Last Vitals:  Vitals:   04/14/16 1441 04/14/16 1530  BP: (!) 153/59 (!) (P) 142/65  Pulse: 73 (P) 81  Resp: 20 (P) 16  Temp: 36.4 C (P) 36.2 C    Last Pain:  Vitals:   04/14/16 1530  TempSrc: (P) Tympanic  PainSc:          Complications: No apparent anesthesia complications

## 2016-04-14 NOTE — Op Note (Signed)
[facility] Gastroenterology Patient Name: [name] [name] Procedure Date: 04/14/2016 2:51 PM MRN: [id number] Account #: [id number] Date of Birth: 1925-04-22 Admit Type: Inpatient Age: 83 Room: [facility] ENDO ROOM 4 Gender: Female Note Status: Finalized Procedure:            Colonoscopy Indications:          Hematochezia Providers:            Lucilla Lame MD, MD Referring MD:         Ardyth Man. Santayana (Referring MD) Medicines:            Propofol per Anesthesia Complications:        No immediate complications. Procedure:            Pre-Anesthesia Assessment:                       - Prior to the procedure, a History and Physical was                        performed, and patient medications and allergies were                        reviewed. The patient's tolerance of previous                        anesthesia was also reviewed. The risks and benefits of                        the procedure and the sedation options and risks were                        discussed with the patient. All questions were                        answered, and informed consent was obtained. Prior                        Anticoagulants: The patient has taken no previous                        anticoagulant or antiplatelet agents. ASA Grade                        Assessment: II - A patient with mild systemic disease.                        After reviewing the risks and benefits, the patient was                        deemed in satisfactory condition to undergo the                        procedure.                       After obtaining informed consent, the colonoscope was                        passed under direct vision. Throughout the procedure,  the patient's blood pressure, pulse, and oxygen                        saturations were monitored continuously. The                        Colonoscope was introduced through the anus and                        advanced to the the  terminal ileum. The colonoscopy was                        performed without difficulty. The patient tolerated the                        procedure well. The quality of the bowel preparation                        was excellent. Findings:      The perianal and digital rectal examinations were normal.      A 6 mm polyp was found in the ascending colon. The polyp was sessile.       The polyp was removed with a cold snare. Resection and retrieval were       complete. To prevent bleeding post-intervention, one hemostatic clip was       successfully placed (MR conditional). There was no bleeding at the end       of the procedure.      Multiple small-mouthed diverticula were found in the sigmoid colon.      The terminal ileum appeared normal.      No fresh or old blood seen in the colon or ileum. Impression:           - One 6 mm polyp in the ascending colon, removed with a                        cold snare. Resected and retrieved. Clip (MR                        conditional) was placed.                       - Diverticulosis in the sigmoid colon.                       - The examined portion of the ileum was normal. Recommendation:       - If any further bleeding consider capsule endoscopy. Procedure Code(s):    --- Professional ---                       [phone], Colonoscopy, flexible; with removal of tumor(s),                        polyp(s), or other lesion(s) by snare technique Diagnosis Code(s):    --- Professional ---                       K92.1, Melena (includes Hematochezia)                       D12.2, Benign neoplasm of ascending colon CPT copyright  2016 American Medical Association. All rights reserved. The codes documented in this report are preliminary and upon coder review may  be revised to meet current compliance requirements. Lucilla Lame MD, MD 04/14/2016 3:28:01 PM This report has been signed electronically. Number of Addenda: 0 Note Initiated On: 04/14/2016 2:51 PM Scope  Withdrawal Time: 0 hours 12 minutes 39 seconds  Total Procedure Duration: 0 hours 25 minutes 47 seconds       [facility]

## 2016-04-14 NOTE — Progress Notes (Signed)
[place] [facility] [place] NAME: [name] [name]    MR#:  [id number]  DATE OF BIRTH:  09/09/1925  SUBJECTIVE:  Hemoglobin 8.2, not having any further bleeding, family at bedside REVIEW OF SYSTEMS:    Review of Systems  Constitutional: Negative for chills and fever.  HENT: Negative for congestion and tinnitus.   Eyes: Negative for blurred vision and double vision.  Respiratory: Negative for cough, shortness of breath and wheezing.   Cardiovascular: Negative for chest pain, orthopnea and PND.  Gastrointestinal: Positive for melena. Negative for abdominal pain, diarrhea, nausea and vomiting.  Genitourinary: Negative for dysuria and hematuria.  Neurological: Positive for weakness. Negative for dizziness, sensory change and focal weakness.  All other systems reviewed and are negative.   Nutrition: Clear liquid diet   DRUG ALLERGIES:   Allergies  Allergen Reactions  . Codeine Nausea And Vomiting  . Dextrans Hives  . Fentanyl Itching  . Lipitor [Atorvastatin] Other (See Comments)    Reaction: Muscle pain  . Mobic [Meloxicam] Other (See Comments)    Reaction: Mouth and tongue ulcers  . Niacin And Related Itching  . Nsaids Hives  . Pravachol [Pravastatin Sodium] Other (See Comments)    Reaction: Muscle pain  . Statins   . Voltaren [Diclofenac Sodium] Other (See Comments)    Reaction: Mouth and tongue ulcers  . Zetia [Ezetimibe] Other (See Comments)    Reaction: Leg pain    VITALS:  Blood pressure (!) 154/55, pulse 69, temperature 98 F (36.7 C), temperature source Oral, resp. rate 14, height 5\' 9"  (1.753 m), weight 83.9 kg (185 lb), SpO2 98 %. PHYSICAL EXAMINATION:   Physical Exam  GENERAL:  83 y.o.-year-old patient lying in bed in no acute distress.  EYES: Pupils equal, round, reactive to light and accommodation. No scleral icterus. Extraocular muscles intact.  HEENT: Head atraumatic, normocephalic. Oropharynx and nasopharynx clear.   NECK:  Supple, no jugular venous distention. No thyroid enlargement, no tenderness.  LUNGS: Normal breath sounds bilaterally, no wheezing, rales, rhonchi. No use of accessory muscles of respiration.  CARDIOVASCULAR: S1, S2 normal. II/VI SEM at LSB, No rubs, or gallops.  ABDOMEN: Soft, nontender, nondistended. Bowel sounds present. No organomegaly or mass.  EXTREMITIES: No cyanosis, clubbing or edema b/l.    NEUROLOGIC: Cranial nerves II through XII are intact. No focal Motor or sensory deficits b/l.   PSYCHIATRIC: The patient is alert and oriented x 3.  SKIN: No obvious rash, lesion, or ulcer.  LABORATORY PANEL:   CBC  Recent Labs Lab 04/14/16 0008  WBC 4.9  HGB 7.6*  HCT 23.3*  PLT 301   ------------------------------------------------------------------------------------------------------------------  Chemistries   Recent Labs Lab 04/14/16 0008 04/14/16 1325  NA 139  --   K 2.8* 3.3*  CL 109  --   CO2 24  --   GLUCOSE 95  --   BUN 11  --   CREATININE 0.60  --   CALCIUM 8.3*  --   MG 1.9  --    ------------------------------------------------------------------------------------------------------------------   ASSESSMENT AND PLAN:  83 year old female with a history of essential hypertension and chronic iron deficiency anemia with recent hospitalization for melanotic stools status post upper GI endoscopy without evidence of acute bleeding who presents again with melanotic stools.  1. Melena -suspected to be Lower GI bleed given the patient's melanotic stools. -Hemoglobin 8.2. -> 7.6 - Colonoscopy showed one 6 mm polyp in the ascending colon, removed.Diverticulosis in the sigmoid colon. - No further bleeding,  we will switch IV Protonix to oral Protonix once a day - Start soft diet -  Stop IV fluids  2.  Hypokalemia -Replete and recheck  - Check magnesium  3. Chronic iron deficiency anemia: Patient follows up with the cancer center and received IV transfusions  there. If hemoglobin less than 7 or hemodynamically unstable patient will require blood transfusions.  4. Anxiety/depression: Continue Paxil  5. History of gout: Continue COLCHINE.  6. HTN - cont. Losartan  7. Neuropathy - cont. Neurontin.   8. Glaucoma - cont. Timolol.     All the records are reviewed and case discussed with Care Management/Social Worker. Management plans discussed with the patient, family, Dr Allen Norris and they are in agreement.  CODE STATUS: Limited Code  DVT Prophylaxis: Ted's & SCD's.   TOTAL TIME TAKING CARE OF THIS PATIENT: 15 minutes.   POSSIBLE D/C IN 1-2 DAYS, DEPENDING ON CLINICAL CONDITION. And GI evaluation   Max Sane M.D on 04/14/2016 at 4:55 PM  Between 7am to 6pm - Pager - [phone]  After 6pm go to [email or web address] - [employer] [facility]  Office  [phone]  CC: Primary care physician; Arrie Aran PATRICIA, DO

## 2016-04-14 NOTE — Anesthesia Preprocedure Evaluation (Signed)
Anesthesia Evaluation  Patient identified by MRN, date of birth, ID band Patient awake    Reviewed: Allergy & Precautions, NPO status , Patient's Chart, lab work & pertinent test results, reviewed documented beta blocker date and time   Airway Mallampati: II  TM Distance: >3 FB     Dental  (+) Chipped   Pulmonary pneumonia, resolved, COPD, former smoker,           Cardiovascular hypertension, Pt. on medications and Pt. on home beta blockers      Neuro/Psych PSYCHIATRIC DISORDERS Depression    GI/Hepatic PUD, GERD  ,  Endo/Other    Renal/GU      Musculoskeletal   Abdominal   Peds  Hematology  (+) anemia ,   Anesthesia Other Findings Gout. Will avoid fentanyl.  Reproductive/Obstetrics                             Anesthesia Physical Anesthesia Plan  ASA: III  Anesthesia Plan: General   Post-op Pain Management:    Induction: Intravenous  Airway Management Planned:   Additional Equipment:   Intra-op Plan:   Post-operative Plan:   Informed Consent: I have reviewed the patients History and Physical, chart, labs and discussed the procedure including the risks, benefits and alternatives for the proposed anesthesia with the patient or authorized representative who has indicated his/her understanding and acceptance.     Plan Discussed with: CRNA  Anesthesia Plan Comments:         Anesthesia Quick Evaluation

## 2016-04-14 NOTE — Anesthesia Post-op Follow-up Note (Cosign Needed)
Anesthesia QCDR form completed.        

## 2016-04-14 NOTE — Anesthesia Procedure Notes (Signed)
Performed by: Demetrius Charity Pre-anesthesia Checklist: Patient identified, Suction available, Patient being monitored, Timeout performed and Emergency Drugs available Patient Re-evaluated:Patient Re-evaluated prior to inductionOxygen Delivery Method: Nasal cannula Intubation Type: IV induction

## 2016-04-14 NOTE — Anesthesia Postprocedure Evaluation (Signed)
Anesthesia Post Note  Patient: [name]  Procedure(s) Performed: Procedure(s) (LRB): COLONOSCOPY WITH PROPOFOL (N/A)  Patient location during evaluation: Endoscopy Anesthesia Type: General Level of consciousness: awake and alert and oriented Pain management: pain level controlled Vital Signs Assessment: post-procedure vital signs reviewed and stable Respiratory status: spontaneous breathing, nonlabored ventilation and respiratory function stable Cardiovascular status: blood pressure returned to baseline and stable Postop Assessment: no signs of nausea or vomiting Anesthetic complications: no     Last Vitals:  Vitals:   04/14/16 1600 04/14/16 1648  BP: (!) 163/68 (!) 154/55  Pulse: 73 69  Resp: 16 14  Temp:  36.7 C    Last Pain:  Vitals:   04/14/16 1648  TempSrc: Oral  PainSc:                  Meri Pelot

## 2016-04-15 LAB — BASIC METABOLIC PANEL
ANION GAP: 5 (ref 5–15)
BUN: 11 mg/dL (ref 6–20)
CALCIUM: 8.5 mg/dL — AB (ref 8.9–10.3)
CO2: 26 mmol/L (ref 22–32)
CREATININE: 0.69 mg/dL (ref 0.44–1.00)
Chloride: 109 mmol/L (ref 101–111)
GFR calc Af Amer: >60 mL/min (ref >60)
GLUCOSE: 105 mg/dL — AB (ref 65–99)
Potassium: 3.2 mmol/L — ABNORMAL LOW (ref 3.5–5.1)
Sodium: 140 mmol/L (ref 135–145)

## 2016-04-15 LAB — CBC
HCT: 23.9 % — ABNORMAL LOW (ref 35.0–47.0)
HEMOGLOBIN: 7.8 g/dL — AB (ref 12.0–16.0)
MCH: 28 pg (ref 26.0–34.0)
MCHC: 32.5 g/dL (ref 32.0–36.0)
MCV: 86 fL (ref 80.0–100.0)
PLATELETS: 298 10*3/uL (ref 150–440)
RBC: 2.77 MIL/uL — AB (ref 3.80–5.20)
RDW: 23 % — AB (ref 11.5–14.5)
WBC: 6.3 10*3/uL (ref 3.6–11.0)

## 2016-04-15 MED ORDER — POTASSIUM CHLORIDE 20 MEQ PO PACK
40.0000 meq | PACK | Freq: Once | ORAL | Status: AC
  Administered 2016-04-15: 40 meq via ORAL
  Filled 2016-04-15: qty 2

## 2016-04-15 NOTE — Discharge Summary (Signed)
[place] at [facility], 83 y.o., DOB 09/09/1925, MRN [id number]. Admission date: 04/12/2016 Discharge Date 04/15/2016 Primary MD Gearldine Shown, DO Admitting Physician Bettey Costa, MD  Admission Diagnosis  UGIB (upper gastrointestinal bleed) [K92.2]  Discharge Diagnosis   Active Problems:   GIB (gastrointestinal bleeding) with hemetochezia source not found    Benign neoplasm of ascending colon  Anemia  COPD GERD Glaucoma Hypertension Iron deficiency         Hospital Course  Patient is 83 year old female with known history of recurrent upper GI bleeding, COPD and chronic iron deficiency anemia presented with dark color stools. She was seen by GI and underwent a colonoscopy which showed a colonic polyp. No real source of the bleeding was identified. She has not had any further bleeding or hemoglobins remained stable. At this point she is asymptomatic and very anxious to go home.            Consults  GI  Significant Tests:  See full reports for all details      No results found.     Today   Subjective:   [name] [name]  patient feels well denies any complaints  Objective:   Blood pressure (!) 151/55, pulse 75, temperature 97.8 F (36.6 C), temperature source Oral, resp. rate 18, height 5\' 9"  (1.753 m), weight 185 lb (83.9 kg), SpO2 96 %.  .  Intake/Output Summary (Last 24 hours) at 04/15/16 1250 Last data filed at 04/15/16 1042  Gross per 24 hour  Intake             1490 ml  Output              300 ml  Net             1190 ml    Exam VITAL SIGNS: Blood pressure (!) 151/55, pulse 75, temperature 97.8 F (36.6 C), temperature source Oral, resp. rate 18, height 5\' 9"  (1.753 m), weight 185 lb (83.9 kg), SpO2 96 %.  GENERAL:  83 y.o.-year-old patient lying in the bed with no acute distress.  EYES: Pupils equal, round, reactive to light and accommodation. No scleral icterus. Extraocular muscles  intact.  HEENT: Head atraumatic, normocephalic. Oropharynx and nasopharynx clear.  NECK:  Supple, no jugular venous distention. No thyroid enlargement, no tenderness.  LUNGS: Normal breath sounds bilaterally, no wheezing, rales,rhonchi or crepitation. No use of accessory muscles of respiration.  CARDIOVASCULAR: S1, S2 normal. No murmurs, rubs, or gallops.  ABDOMEN: Soft, nontender, nondistended. Bowel sounds present. No organomegaly or mass.  EXTREMITIES: No pedal edema, cyanosis, or clubbing.  NEUROLOGIC: Cranial nerves II through XII are intact. Muscle strength 5/5 in all extremities. Sensation intact. Gait not checked.  PSYCHIATRIC: The patient is alert and oriented x 3.  SKIN: No obvious rash, lesion, or ulcer.   Data Review     CBC w Diff: Lab Results  Component Value Date   WBC 6.3 04/15/2016   HGB 7.8 (L) 04/15/2016   HCT 23.9 (L) 04/15/2016   PLT 298 04/15/2016   LYMPHOPCT 15 04/12/2016   BANDSPCT 0 10/10/2015   MONOPCT 10 04/12/2016   EOSPCT 2 04/12/2016   BASOPCT 1 04/12/2016   CMP: Lab Results  Component Value Date   NA 140 04/15/2016   K 3.2 (L) 04/15/2016   CL 109 04/15/2016   CO2 26 04/15/2016   BUN 11 04/15/2016   CREATININE 0.69 04/15/2016   PROT 6.9 03/31/2016   ALBUMIN  3.7 03/31/2016   BILITOT 0.6 03/31/2016   ALKPHOS 60 03/31/2016   AST 26 03/31/2016   ALT 17 03/31/2016  .  Micro Results No results found for this or any previous visit (from the past 240 hour(s)).      Code Status Orders        Start     Ordered   04/12/16 1716  Full code  Continuous     04/12/16 1715    Code Status History    Date Active Date Inactive Code Status Order ID Comments User Context   03/31/2016 10:57 PM 04/04/2016  5:41 PM Partial Code [id number]  Harrie Foreman, MD Inpatient   03/31/2016  7:25 PM 03/31/2016 10:57 PM Full Code [id number]  Loletha Grayer, MD ED   10/10/2015 10:32 PM 10/13/2015  9:47 PM Full Code [id number]  Harvie Bridge, DO ED   08/11/2015   1:41 AM 08/13/2015  6:58 PM Full Code [id number]  Lance Coon, MD Inpatient    Advance Directive Documentation     Most Recent Value  Type of Advance Directive  Healthcare Power of Attorney  Pre-existing out of facility DNR order (yellow form or pink MOST form)  -  "MOST" Form in Place?  -          Follow-up Information    SANTAYANA, [place], DO Follow up in 1 week(s).   Specialty:  Family Medicine Contact information: [place] [id number] [phone]        cancer center Follow up in 2 day(s).   Why:  to have cbc checked          Discharge Medications   Allergies as of 04/15/2016      Reactions   Codeine Nausea And Vomiting   Dextrans Hives   Fentanyl Itching   Lipitor [atorvastatin] Other (See Comments)   Reaction: Muscle pain   Mobic [meloxicam] Other (See Comments)   Reaction: Mouth and tongue ulcers   Niacin And Related Itching   Nsaids Hives   Pravachol [pravastatin Sodium] Other (See Comments)   Reaction: Muscle pain   Statins    Voltaren [diclofenac Sodium] Other (See Comments)   Reaction: Mouth and tongue ulcers   Zetia [ezetimibe] Other (See Comments)   Reaction: Leg pain      Medication List    TAKE these medications   acetaminophen 650 MG CR tablet Commonly known as:  TYLENOL Take 650 mg by mouth every 8 (eight) hours as needed for pain.   albuterol 108 (90 Base) MCG/ACT inhaler Commonly known as:  PROVENTIL HFA;VENTOLIN HFA Inhale 2 puffs into the lungs every 4 (four) hours as needed for wheezing or shortness of breath.   B-12 1000 MCG Caps Take 2,000 Units by mouth 2 (two) times daily.   chlorthalidone 25 MG tablet Commonly known as:  HYGROTON Take 12.5 mg by mouth daily.   COENZYME Q10 PO Take 1 tablet by mouth daily.   colchicine 0.6 MG tablet Take 0.6 mg by mouth daily.   cycloSPORINE 0.05 % ophthalmic emulsion Commonly known as:  RESTASIS Place 1 drop into both eyes 2 (two) times daily.   EYE  DROPS ADVANCED RELIEF OP Apply to eye.   fluticasone 110 MCG/ACT inhaler Commonly known as:  FLOVENT HFA Inhale 1 puff into the lungs 2 (two) times daily.   gabapentin 300 MG capsule Commonly known as:  NEURONTIN Take 300 mg by mouth 2 (two) times daily.   guaiFENesin 600 MG 12 hr tablet  Commonly known as:  MUCINEX Take 600 mg by mouth 2 (two) times daily as needed.   latanoprost 0.005 % ophthalmic solution Commonly known as:  XALATAN Apply 1 drop to eye at bedtime.   loratadine 10 MG tablet Commonly known as:  CLARITIN Take 10 mg by mouth daily as needed for allergies.   losartan 50 MG tablet Commonly known as:  COZAAR Take 50 mg by mouth daily.   multivitamin tablet Take 1 tablet by mouth daily.   OCUVITE PRESERVISION PO Take 1 tablet by mouth 2 (two) times daily.   omeprazole 20 MG capsule Commonly known as:  PRILOSEC Take 20 mg by mouth 2 (two) times daily before a meal.   PARoxetine 40 MG tablet Commonly known as:  PAXIL Take 40 mg by mouth every morning.   potassium chloride 10 MEQ tablet Commonly known as:  K-DUR,KLOR-CON Take 10 mEq by mouth 2 (two) times daily.   PROCTOSOL HC 2.5 % rectal cream Generic drug:  hydrocortisone PLACE RECTALLY TWICE A DAY   RABEprazole 20 MG tablet Commonly known as:  ACIPHEX Take 20 mg by mouth 2 (two) times daily.   saccharomyces boulardii 250 MG capsule Commonly known as:  FLORASTOR Take 250 mg by mouth daily.   sucralfate 1 g tablet Commonly known as:  CARAFATE Take 1 tablet (1 g total) by mouth 4 (four) times daily -  with meals and at bedtime.   SYSTANE 0.4-0.3 % Soln Generic drug:  Polyethyl Glycol-Propyl Glycol Apply 1 drop to eye as needed.   timolol 0.5 % ophthalmic gel-forming Commonly known as:  TIMOPTIC-XR Place 1 drop into both eyes daily.   valACYclovir 1000 MG tablet Commonly known as:  VALTREX once daily as needed. Reported on 03/22/2015   Vitamin D (Ergocalciferol) 50000 units Caps  capsule Commonly known as:  DRISDOL Take by mouth.   VITAMIN D PO Take 1 tablet by mouth daily.          Total Time in preparing paper work, data evaluation and todays exam - 35 minutes  Dustin Flock M.D on 04/15/2016 at 12:50 PM  [facility]   Office  [phone]

## 2016-04-15 NOTE — Discharge Instructions (Signed)
[facility] - [place] at [facility] ° °DIET:  °Cardiac diet ° °DISCHARGE CONDITION:  °Stable ° °ACTIVITY:  °Activity as tolerated ° °OXYGEN:  °Home Oxygen: No. °  °Oxygen Delivery: room air ° °DISCHARGE LOCATION:  °home  ° ° °ADDITIONAL DISCHARGE INSTRUCTION: ° ° °If you experience worsening of your admission symptoms, develop shortness of breath, life threatening emergency, suicidal or homicidal thoughts you must seek medical attention immediately by calling 911 or calling your MD immediately  if symptoms less severe. ° °You Must read complete instructions/literature along with all the possible adverse reactions/side effects for all the Medicines you take and that have been prescribed to you. Take any new Medicines after you have completely understood and accpet all the possible adverse reactions/side effects.  ° °Please note ° °You were cared for by a hospitalist during your hospital stay. If you have any questions about your discharge medications or the care you received while you were in the hospital after you are discharged, you can call the unit and asked to speak with the hospitalist on call if the hospitalist that took care of you is not available. Once you are discharged, your primary care physician will handle any further medical issues. Please note that NO REFILLS for any discharge medications will be authorized once you are discharged, as it is imperative that you return to your primary care physician (or establish a relationship with a primary care physician if you do not have one) for your aftercare needs so that they can reassess your need for medications and monitor your lab values. ° ° °

## 2016-04-17 ENCOUNTER — Other Ambulatory Visit: Payer: Self-pay | Admitting: Oncology

## 2016-04-17 ENCOUNTER — Other Ambulatory Visit: Payer: Self-pay | Admitting: *Deleted

## 2016-04-17 ENCOUNTER — Encounter: Payer: Self-pay | Admitting: Gastroenterology

## 2016-04-17 ENCOUNTER — Telehealth: Payer: Self-pay | Admitting: *Deleted

## 2016-04-17 ENCOUNTER — Inpatient Hospital Stay: Payer: Medicare Other | Attending: Oncology

## 2016-04-17 DIAGNOSIS — D500 Iron deficiency anemia secondary to blood loss (chronic): Secondary | ICD-10-CM

## 2016-04-17 DIAGNOSIS — D509 Iron deficiency anemia, unspecified: Secondary | ICD-10-CM | POA: Diagnosis not present

## 2016-04-17 LAB — CBC WITH DIFFERENTIAL/PLATELET
BASOS PCT: 2 %
Basophils Absolute: 0.1 10*3/uL (ref 0–0.1)
EOS ABS: 0.3 10*3/uL (ref 0–0.7)
Eosinophils Relative: 5 %
HEMATOCRIT: 25.9 % — AB (ref 35.0–47.0)
HEMOGLOBIN: 8.7 g/dL — AB (ref 12.0–16.0)
Lymphocytes Relative: 16 %
Lymphs Abs: 0.9 10*3/uL — ABNORMAL LOW (ref 1.0–3.6)
MCH: 28.4 pg (ref 26.0–34.0)
MCHC: 33.5 g/dL (ref 32.0–36.0)
MCV: 84.8 fL (ref 80.0–100.0)
MONOS PCT: 12 %
Monocytes Absolute: 0.7 10*3/uL (ref 0.2–0.9)
NEUTROS ABS: 4 10*3/uL (ref 1.4–6.5)
NEUTROS PCT: 65 %
Platelets: 342 10*3/uL (ref 150–440)
RBC: 3.05 MIL/uL — AB (ref 3.80–5.20)
RDW: 21.6 % — ABNORMAL HIGH (ref 11.5–14.5)
WBC: 6.1 10*3/uL (ref 3.6–11.0)

## 2016-04-17 LAB — IRON AND TIBC
Iron: 29 ug/dL (ref 28–170)
SATURATION RATIOS: 9 % — AB (ref 10.4–31.8)
TIBC: 338 ug/dL (ref 250–450)
UIBC: 309 ug/dL

## 2016-04-17 LAB — FERRITIN: FERRITIN: 97 ng/mL (ref 11–307)

## 2016-04-17 NOTE — Telephone Encounter (Signed)
Per Dr. Janese Banks, pt does not need iron treatments this week. Spoke with patient and informed her of this. Instructed her to keep her next appt on 5/15 at 1:15pm. Pt verbalized understanding.

## 2016-04-17 NOTE — Telephone Encounter (Signed)
Discharged from hospital and was instructed to come here for lab check today. Please advise

## 2016-04-17 NOTE — Telephone Encounter (Signed)
Per Dr Janese Banks, come in for labs today and Venofer tomorrow and Friday.  Beverlee Nims - scheduler notified to schedule patient

## 2016-04-18 ENCOUNTER — Encounter: Payer: Self-pay | Admitting: Gastroenterology

## 2016-04-18 LAB — SURGICAL PATHOLOGY

## 2016-04-19 ENCOUNTER — Encounter: Payer: Self-pay | Admitting: Gastroenterology

## 2016-04-19 ENCOUNTER — Ambulatory Visit (INDEPENDENT_AMBULATORY_CARE_PROVIDER_SITE_OTHER): Payer: Medicare Other | Admitting: Gastroenterology

## 2016-04-19 ENCOUNTER — Other Ambulatory Visit
Admission: RE | Admit: 2016-04-19 | Discharge: 2016-04-19 | Disposition: A | Discharge status: Home / Self Care | Payer: Medicare Other | Source: Ambulatory Visit | Attending: Gastroenterology | Admitting: Gastroenterology

## 2016-04-19 VITALS — BP 135/61 | HR 99 | Temp 98.3°F | Ht 69.0 in | Wt 181.4 lb

## 2016-04-19 DIAGNOSIS — D509 Iron deficiency anemia, unspecified: Secondary | ICD-10-CM | POA: Diagnosis present

## 2016-04-19 NOTE — Progress Notes (Signed)
Primary Care Physician: Gearldine Shown, DO  Primary Gastroenterologist:  Dr. [name]   Chief Complaint  Patient presents with  . Hospitalization Follow-up  . GI Bleeding    HPI: [name] [name] is a 83 y.o. female . She is here today for a hospital follow up . She was initially admitted on 04/01/16 when she presented with melena . She has a history of iron deficiency anemia. She had an EGD which was normal except for a small hiatal hernia. She was discharged and readmitted on /4/18 with black stools. She had a colonoscopy which did not reveal any source of bleeding . Iron studies 2 days back show she has a low iron. Urine analysis shows no blood.   Since discharge she says she has not had any black stool since discharge. Not noticed any other bleeding elsewhere such as from her urine, vagina or nose. Not on any iron tablets since she cannot tolerate them .    Current Outpatient Prescriptions  Medication Sig Dispense Refill  . acetaminophen (TYLENOL) 650 MG CR tablet Take 650 mg by mouth every 8 (eight) hours as needed for pain.    Marland Kitchen albuterol (PROVENTIL HFA;VENTOLIN HFA) 108 (90 Base) MCG/ACT inhaler Inhale 2 puffs into the lungs every 4 (four) hours as needed for wheezing or shortness of breath.    . chlorthalidone (HYGROTON) 25 MG tablet Take 12.5 mg by mouth daily.     . Cholecalciferol (VITAMIN D PO) Take 1 tablet by mouth daily.    Marland Kitchen COENZYME Q10 PO Take 1 tablet by mouth daily.    . colchicine 0.6 MG tablet Take 0.6 mg by mouth daily.     . Cyanocobalamin (B-12) 1000 MCG CAPS Take 2,000 Units by mouth 2 (two) times daily.    . cycloSPORINE (RESTASIS) 0.05 % ophthalmic emulsion Place 1 drop into both eyes 2 (two) times daily.    . fluticasone (FLOVENT HFA) 110 MCG/ACT inhaler Inhale 1 puff into the lungs 2 (two) times daily.     Marland Kitchen gabapentin (NEURONTIN) 300 MG capsule Take 300 mg by mouth 2 (two) times daily.    Marland Kitchen guaiFENesin (MUCINEX) 600 MG 12 hr tablet Take  600 mg by mouth 2 (two) times daily as needed.    . hydrocortisone (PROCTOSOL HC) 2.5 % rectal cream PLACE RECTALLY TWICE A DAY    . latanoprost (XALATAN) 0.005 % ophthalmic solution Apply 1 drop to eye at bedtime.     Marland Kitchen loratadine (CLARITIN) 10 MG tablet Take 10 mg by mouth daily as needed for allergies.    Marland Kitchen losartan (COZAAR) 50 MG tablet Take 50 mg by mouth daily.    . Multiple Vitamin (MULTIVITAMIN) tablet Take 1 tablet by mouth daily.    . Multiple Vitamins-Minerals (OCUVITE PRESERVISION PO) Take 1 tablet by mouth 2 (two) times daily.    Marland Kitchen omeprazole (PRILOSEC) 20 MG capsule Take 20 mg by mouth 2 (two) times daily before a meal.    . PARoxetine (PAXIL) 40 MG tablet Take 40 mg by mouth every morning.    Vladimir Faster Glycol-Propyl Glycol (SYSTANE) 0.4-0.3 % SOLN Apply 1 drop to eye as needed.    . potassium chloride (K-DUR) 10 MEQ tablet     . potassium chloride (K-DUR) 10 MEQ tablet Take by mouth.    . potassium chloride (K-DUR,KLOR-CON) 10 MEQ tablet Take 10 mEq by mouth 2 (two) times daily.    Marland Kitchen RA KRILL OIL 500 MG CAPS Take by mouth.    Marland Kitchen  RABEprazole (ACIPHEX) 20 MG tablet Take 20 mg by mouth 2 (two) times daily.    Marland Kitchen saccharomyces boulardii (FLORASTOR) 250 MG capsule Take 250 mg by mouth daily.    . sucralfate (CARAFATE) 1 g tablet Take 1 tablet (1 g total) by mouth 4 (four) times daily -  with meals and at bedtime. 120 tablet 1  . Tetrahydroz-Dextran-PEG-Povid (EYE DROPS ADVANCED RELIEF OP) Apply to eye.    . timolol (TIMOPTIC-XR) 0.5 % ophthalmic gel-forming Place 1 drop into both eyes daily.    . valACYclovir (VALTREX) 1000 MG tablet once daily as needed. Reported on 03/22/2015    . Vitamin D, Ergocalciferol, (DRISDOL) 50000 units CAPS capsule Take by mouth.     No current facility-administered medications for this visit.     Allergies as of 04/19/2016 - Review Complete 04/19/2016  Allergen Reaction Noted  . Codeine Nausea And Vomiting 08/10/2015  . Dextrans Hives 08/10/2015  .  Fentanyl Itching 08/10/2015  . Lipitor [atorvastatin] Other (See Comments) 08/10/2015  . Mobic [meloxicam] Other (See Comments) 08/10/2015  . Niacin and related Itching 08/10/2015  . Nsaids Hives 08/10/2015  . Pravachol [pravastatin sodium] Other (See Comments) 08/10/2015  . Statins  03/31/2016  . Voltaren [diclofenac sodium] Other (See Comments) 08/10/2015  . Zetia [ezetimibe] Other (See Comments) 08/10/2015    ROS:  General: Negative for anorexia, weight loss, fever, chills, fatigue, weakness. ENT: Negative for hoarseness, difficulty swallowing , nasal congestion. CV: Negative for chest pain, angina, palpitations, dyspnea on exertion, peripheral edema.  Respiratory: Negative for dyspnea at rest, dyspnea on exertion, cough, sputum, wheezing.  GI: See history of present illness. GU:  Negative for dysuria, hematuria, urinary incontinence, urinary frequency, nocturnal urination.  Endo: Negative for unusual weight change.    Physical Examination:   BP 135/61   Pulse 99   Temp 98.3 F (36.8 C) (Oral)   Ht 5\' 9"  (1.753 m)   Wt 181 lb 6.4 oz (82.3 kg)   BMI 26.79 kg/m   General: Well-nourished, well-developed in no acute distress.  Eyes: No icterus. Conjunctivae pink. Mouth: Oropharyngeal mucosa moist and pink , no lesions erythema or exudate. Lungs: Clear to auscultation bilaterally. Non-labored. Heart: Regular rate and rhythm, no murmurs rubs or gallops.  Abdomen: Bowel sounds are normal, nontender, nondistended, no hepatosplenomegaly or masses, no abdominal bruits or hernia , no rebound or guarding.   Extremities: No lower extremity edema. No clubbing or deformities. Neuro: Alert and oriented x 3.  Grossly intact. Skin: Warm and dry, no jaundice.   Psych: Alert and cooperative, normal mood and affect.   Imaging Studies: No results found.  Assessment and Plan:   [name] [name] is a 83 y.o. y/o female here to follow up to her hosp`ital discharge for melena and iron  deficiency anemia. So far EGD+colonoscopy have been negative.She has had a few abdominal surgeries and I will obtain a SBFT and if normal then will need Capsule study   Plan  1. Capsule study  2. Check b12,folate and celiac panel  3. SBFT 4. IV iron with hematology     Dr [name]  MD Follow up in 10 weeks

## 2016-04-20 ENCOUNTER — Other Ambulatory Visit: Payer: Self-pay

## 2016-04-20 DIAGNOSIS — D509 Iron deficiency anemia, unspecified: Secondary | ICD-10-CM

## 2016-04-21 ENCOUNTER — Encounter: Payer: Self-pay | Admitting: Gastroenterology

## 2016-04-21 ENCOUNTER — Telehealth: Payer: Self-pay | Admitting: Gastroenterology

## 2016-04-21 ENCOUNTER — Telehealth: Payer: Self-pay

## 2016-04-21 LAB — CELIAC DISEASE PANEL
ENDOMYSIAL ANTIBODY IGA: NEGATIVE
IgA: 209 mg/dL (ref 64–422)

## 2016-04-21 NOTE — Telephone Encounter (Signed)
error 

## 2016-04-21 NOTE — Telephone Encounter (Signed)
Advised patient of [facility] Capsule Study scheduled for Thursday, 4/19 @ 730am  Advised NPO after 9midnight

## 2016-04-21 NOTE — Telephone Encounter (Signed)
04/21/16 NO prior auth required for Capsule Study 91110 / D50.9 for [employer].

## 2016-04-25 ENCOUNTER — Other Ambulatory Visit: Payer: Self-pay

## 2016-04-28 ENCOUNTER — Ambulatory Visit
Admission: RE | Admit: 2016-04-28 | Discharge: 2016-04-28 | Disposition: A | Discharge status: Home / Self Care | Payer: Medicare Other | Source: Ambulatory Visit | Attending: Gastroenterology | Admitting: Gastroenterology

## 2016-04-28 ENCOUNTER — Encounter
Admission: RE | Disposition: A | Discharge status: Home / Self Care | Payer: Self-pay | Source: Ambulatory Visit | Attending: Gastroenterology

## 2016-04-28 DIAGNOSIS — D509 Iron deficiency anemia, unspecified: Secondary | ICD-10-CM | POA: Diagnosis not present

## 2016-04-28 DIAGNOSIS — K922 Gastrointestinal hemorrhage, unspecified: Secondary | ICD-10-CM | POA: Insufficient documentation

## 2016-04-28 DIAGNOSIS — K219 Gastro-esophageal reflux disease without esophagitis: Secondary | ICD-10-CM | POA: Insufficient documentation

## 2016-04-28 HISTORY — PX: GIVENS CAPSULE STUDY: SHX5432

## 2016-04-28 SURGERY — IMAGING PROCEDURE, GI TRACT, INTRALUMINAL, VIA CAPSULE

## 2016-05-01 ENCOUNTER — Encounter: Payer: Self-pay | Admitting: Gastroenterology

## 2016-05-09 ENCOUNTER — Inpatient Hospital Stay
Admission: EM | Admit: 2016-05-09 | Discharge: 2016-05-12 | DRG: 345 | Disposition: A | Discharge status: Home / Self Care | Payer: Medicare Other | Attending: Internal Medicine | Admitting: Internal Medicine

## 2016-05-09 DIAGNOSIS — D62 Acute posthemorrhagic anemia: Secondary | ICD-10-CM | POA: Diagnosis present

## 2016-05-09 DIAGNOSIS — K449 Diaphragmatic hernia without obstruction or gangrene: Secondary | ICD-10-CM | POA: Diagnosis present

## 2016-05-09 DIAGNOSIS — K921 Melena: Secondary | ICD-10-CM

## 2016-05-09 DIAGNOSIS — D638 Anemia in other chronic diseases classified elsewhere: Secondary | ICD-10-CM | POA: Diagnosis present

## 2016-05-09 DIAGNOSIS — J449 Chronic obstructive pulmonary disease, unspecified: Secondary | ICD-10-CM | POA: Diagnosis present

## 2016-05-09 DIAGNOSIS — Z8249 Family history of ischemic heart disease and other diseases of the circulatory system: Secondary | ICD-10-CM

## 2016-05-09 DIAGNOSIS — K922 Gastrointestinal hemorrhage, unspecified: Secondary | ICD-10-CM | POA: Diagnosis present

## 2016-05-09 DIAGNOSIS — E785 Hyperlipidemia, unspecified: Secondary | ICD-10-CM | POA: Diagnosis present

## 2016-05-09 DIAGNOSIS — I10 Essential (primary) hypertension: Secondary | ICD-10-CM | POA: Diagnosis present

## 2016-05-09 DIAGNOSIS — K5520 Angiodysplasia of colon without hemorrhage: Secondary | ICD-10-CM | POA: Diagnosis not present

## 2016-05-09 DIAGNOSIS — K219 Gastro-esophageal reflux disease without esophagitis: Secondary | ICD-10-CM | POA: Diagnosis present

## 2016-05-09 DIAGNOSIS — Z79899 Other long term (current) drug therapy: Secondary | ICD-10-CM | POA: Diagnosis not present

## 2016-05-09 DIAGNOSIS — H409 Unspecified glaucoma: Secondary | ICD-10-CM | POA: Diagnosis present

## 2016-05-09 DIAGNOSIS — E876 Hypokalemia: Secondary | ICD-10-CM | POA: Diagnosis present

## 2016-05-09 DIAGNOSIS — K5521 Angiodysplasia of colon with hemorrhage: Principal | ICD-10-CM | POA: Diagnosis present

## 2016-05-09 DIAGNOSIS — Z87891 Personal history of nicotine dependence: Secondary | ICD-10-CM

## 2016-05-09 DIAGNOSIS — Z888 Allergy status to other drugs, medicaments and biological substances status: Secondary | ICD-10-CM | POA: Diagnosis not present

## 2016-05-09 DIAGNOSIS — Z90710 Acquired absence of both cervix and uterus: Secondary | ICD-10-CM

## 2016-05-09 DIAGNOSIS — Z885 Allergy status to narcotic agent status: Secondary | ICD-10-CM | POA: Diagnosis not present

## 2016-05-09 LAB — COMPREHENSIVE METABOLIC PANEL
ALBUMIN: 3.6 g/dL (ref 3.5–5.0)
ALK PHOS: 56 U/L (ref 38–126)
ALT: 16 U/L (ref 14–54)
AST: 26 U/L (ref 15–41)
Anion gap: 9 (ref 5–15)
BUN: 38 mg/dL — AB (ref 6–20)
CALCIUM: 8.8 mg/dL — AB (ref 8.9–10.3)
CO2: 27 mmol/L (ref 22–32)
CREATININE: 0.61 mg/dL (ref 0.44–1.00)
Chloride: 103 mmol/L (ref 101–111)
GFR calc Af Amer: >60 mL/min (ref >60)
GFR calc non Af Amer: >60 mL/min (ref >60)
GLUCOSE: 107 mg/dL — AB (ref 65–99)
Potassium: 3.4 mmol/L — ABNORMAL LOW (ref 3.5–5.1)
SODIUM: 139 mmol/L (ref 135–145)
Total Bilirubin: 0.4 mg/dL (ref 0.3–1.2)
Total Protein: 6.7 g/dL (ref 6.5–8.1)

## 2016-05-09 LAB — CBC
HCT: 25.7 % — ABNORMAL LOW (ref 35.0–47.0)
Hemoglobin: 8.3 g/dL — ABNORMAL LOW (ref 12.0–16.0)
MCH: 25.8 pg — AB (ref 26.0–34.0)
MCHC: 32.3 g/dL (ref 32.0–36.0)
MCV: 80.1 fL (ref 80.0–100.0)
PLATELETS: 357 10*3/uL (ref 150–440)
RBC: 3.21 MIL/uL — ABNORMAL LOW (ref 3.80–5.20)
RDW: 19.4 % — ABNORMAL HIGH (ref 11.5–14.5)
WBC: 10.2 10*3/uL (ref 3.6–11.0)

## 2016-05-09 LAB — HEMOGLOBIN: HEMOGLOBIN: 7.5 g/dL — AB (ref 12.0–16.0)

## 2016-05-09 LAB — LIPASE, BLOOD: Lipase: 30 U/L (ref 11–51)

## 2016-05-09 MED ORDER — SENNOSIDES-DOCUSATE SODIUM 8.6-50 MG PO TABS: 1.0000 | ORAL_TABLET | Freq: Every evening | ORAL | Status: DC | PRN

## 2016-05-09 MED ORDER — COLCHICINE 0.6 MG PO TABS
0.6000 mg | ORAL_TABLET | Freq: Every day | ORAL | Status: DC
  Administered 2016-05-09 – 2016-05-11 (×2): 0.6 mg via ORAL
  Filled 2016-05-09 (×3): qty 1

## 2016-05-09 MED ORDER — ONDANSETRON HCL 4 MG/2ML IJ SOLN: 4.0000 mg | Freq: Four times a day (QID) | INTRAMUSCULAR | Status: DC | PRN

## 2016-05-09 MED ORDER — LATANOPROST 0.005 % OP SOLN
1.0000 [drp] | Freq: Every day | OPHTHALMIC | Status: DC
  Administered 2016-05-09: 1 [drp] via OPHTHALMIC
  Filled 2016-05-09: qty 2.5

## 2016-05-09 MED ORDER — PANTOPRAZOLE SODIUM 40 MG IV SOLR
40.0000 mg | Freq: Two times a day (BID) | INTRAVENOUS | Status: DC
  Administered 2016-05-09 – 2016-05-12 (×5): 40 mg via INTRAVENOUS
  Filled 2016-05-09 (×6): qty 40

## 2016-05-09 MED ORDER — ACETAMINOPHEN 325 MG PO TABS: 650.0000 mg | ORAL_TABLET | Freq: Four times a day (QID) | ORAL | Status: DC | PRN

## 2016-05-09 MED ORDER — POTASSIUM CHLORIDE IN NACL 20-0.9 MEQ/L-% IV SOLN
INTRAVENOUS | Status: DC
  Administered 2016-05-09 – 2016-05-10 (×2): via INTRAVENOUS
  Filled 2016-05-09 (×2): qty 1000

## 2016-05-09 MED ORDER — ACETAMINOPHEN 650 MG RE SUPP: 650.0000 mg | Freq: Four times a day (QID) | RECTAL | Status: DC | PRN

## 2016-05-09 MED ORDER — ALBUTEROL SULFATE (2.5 MG/3ML) 0.083% IN NEBU: 2.5000 mg | INHALATION_SOLUTION | RESPIRATORY_TRACT | Status: DC | PRN

## 2016-05-09 MED ORDER — LOSARTAN POTASSIUM 50 MG PO TABS
50.0000 mg | ORAL_TABLET | Freq: Every day | ORAL | Status: DC
  Administered 2016-05-09 – 2016-05-12 (×3): 50 mg via ORAL
  Filled 2016-05-09 (×3): qty 1

## 2016-05-09 MED ORDER — LORATADINE 10 MG PO TABS: 10.0000 mg | ORAL_TABLET | Freq: Every day | ORAL | Status: DC | PRN

## 2016-05-09 MED ORDER — ALBUTEROL SULFATE HFA 108 (90 BASE) MCG/ACT IN AERS: 2.0000 | INHALATION_SPRAY | RESPIRATORY_TRACT | Status: DC | PRN

## 2016-05-09 MED ORDER — POTASSIUM CHLORIDE CRYS ER 10 MEQ PO TBCR
10.0000 meq | EXTENDED_RELEASE_TABLET | Freq: Two times a day (BID) | ORAL | Status: DC
  Administered 2016-05-09 – 2016-05-10 (×2): 10 meq via ORAL
  Filled 2016-05-09 (×3): qty 1

## 2016-05-09 MED ORDER — PAROXETINE HCL 20 MG PO TABS
40.0000 mg | ORAL_TABLET | ORAL | Status: DC
  Administered 2016-05-09 – 2016-05-12 (×3): 40 mg via ORAL
  Filled 2016-05-09 (×3): qty 2

## 2016-05-09 MED ORDER — FLUTICASONE PROPIONATE HFA 110 MCG/ACT IN AERO: 1.0000 | INHALATION_SPRAY | Freq: Two times a day (BID) | RESPIRATORY_TRACT | Status: DC

## 2016-05-09 MED ORDER — SUCRALFATE 1 G PO TABS
1.0000 g | ORAL_TABLET | Freq: Three times a day (TID) | ORAL | Status: DC
  Administered 2016-05-09 – 2016-05-12 (×8): 1 g via ORAL
  Filled 2016-05-09 (×8): qty 1

## 2016-05-09 MED ORDER — ONDANSETRON HCL 4 MG PO TABS: 4.0000 mg | ORAL_TABLET | Freq: Four times a day (QID) | ORAL | Status: DC | PRN

## 2016-05-09 MED ORDER — CYCLOSPORINE 0.05 % OP EMUL
1.0000 [drp] | Freq: Two times a day (BID) | OPHTHALMIC | Status: DC
  Administered 2016-05-09 – 2016-05-12 (×5): 1 [drp] via OPHTHALMIC
  Filled 2016-05-09 (×6): qty 1

## 2016-05-09 MED ORDER — SACCHAROMYCES BOULARDII 250 MG PO CAPS
250.0000 mg | ORAL_CAPSULE | Freq: Every day | ORAL | Status: DC
  Administered 2016-05-09 – 2016-05-12 (×3): 250 mg via ORAL
  Filled 2016-05-09 (×3): qty 1

## 2016-05-09 MED ORDER — TIMOLOL MALEATE 0.5 % OP SOLG
1.0000 [drp] | Freq: Every day | OPHTHALMIC | Status: DC
  Administered 2016-05-09 – 2016-05-10 (×2): 1 [drp] via OPHTHALMIC
  Filled 2016-05-09: qty 5

## 2016-05-09 MED ORDER — GABAPENTIN 300 MG PO CAPS
300.0000 mg | ORAL_CAPSULE | Freq: Two times a day (BID) | ORAL | Status: DC
  Administered 2016-05-09 – 2016-05-12 (×5): 300 mg via ORAL
  Filled 2016-05-09 (×5): qty 1

## 2016-05-09 MED ORDER — BUDESONIDE 0.25 MG/2ML IN SUSP
0.2500 mg | Freq: Two times a day (BID) | RESPIRATORY_TRACT | Status: DC
  Administered 2016-05-09 – 2016-05-12 (×6): 0.25 mg via RESPIRATORY_TRACT
  Filled 2016-05-09 (×6): qty 2

## 2016-05-09 NOTE — ED Provider Notes (Signed)
[facility] Emergency Department Provider Note   ____________________________________________    I have reviewed the triage vital signs and the nursing notes.   HISTORY  Chief Complaint Blood In Stools and Diarrhea     HPI [name] [name] is a 83 y.o. female Who presents with complaints of black stools. Patient reports this morning she had multiple "sticky" stools. Patient 4 she has had this several times over the last year, she has had endoscopy and colonoscopy and recently had a capsule study by Dr. Vicente Males. It is not clear what is causing this at this point. She has required transfusions in the past. She reports abdominal bloating but no pain currently. No nausea or vomiting.   Past Medical History:  Diagnosis Date  . Anemia   . COPD (chronic obstructive pulmonary disease) ([place])   . Diverticulitis   . GERD (gastroesophageal reflux disease)   . Glaucoma   . HLD (hyperlipidemia)   . Hypertension   . Iron deficiency   . Rectal bleeding     Patient Active Problem List   Diagnosis Date Noted  . Blood in stool   . Benign neoplasm of ascending colon   . UGIB (upper gastrointestinal bleed)   . GIB (gastrointestinal bleeding) 04/12/2016  . Melena   . Healthcare-associated pertussis 10/10/2015  . Healthcare-associated pneumonia 10/10/2015  . GI bleed 08/10/2015  . Iron deficiency anemia due to chronic blood loss 08/10/2015  . HTN (hypertension), benign 08/10/2015  . Other and unspecified hyperlipidemia 08/10/2015  . GERD (gastroesophageal reflux disease) 08/10/2015  . Chronic obstructive pulmonary disease ([place]) 08/10/2015  . Glaucoma suspect 12/26/2012  . Fatigue 02/12/2012  . H/O total hip arthroplasty 01/25/2012  . Total knee replacement status 01/25/2012  . Arthritis of knee 01/18/2012  . ARMD (age related macular degeneration) 11/21/2011  . Foot drop 11/15/2011  . Hallux valgus 11/15/2011  . Hammer toe, acquired 11/15/2011  .  Recurrent major depressive disorder, in partial remission ([place]) 07/10/2011  . Degenerative disc disease 04/11/2011  . Fibromyalgia 02/10/2011  . Diverticulosis of colon 07/18/2010  . Gastric ulcer 07/18/2010  . Hereditary and idiopathic peripheral neuropathy 07/18/2010  . Meniere's disease 07/18/2010  . Osteoarthritis, generalized 07/18/2010  . Hemorrhoids 07/18/2010    Past Surgical History:  Procedure Laterality Date  . ABDOMINAL HYSTERECTOMY    . APPENDECTOMY    . COLONOSCOPY WITH PROPOFOL N/A 04/14/2016   Procedure: COLONOSCOPY WITH PROPOFOL;  Surgeon: Lucilla Lame, MD;  Location: [facility] ENDOSCOPY;  Service: Endoscopy;  Laterality: N/A;  . ESOPHAGOGASTRODUODENOSCOPY (EGD) WITH PROPOFOL N/A 08/12/2015   Procedure: ESOPHAGOGASTRODUODENOSCOPY (EGD) WITH PROPOFOL;  Surgeon: Lollie Sails, MD;  Location: [facility] ENDOSCOPY;  Service: Endoscopy;  Laterality: N/A;  . ESOPHAGOGASTRODUODENOSCOPY (EGD) WITH PROPOFOL N/A 04/03/2016   Procedure: ESOPHAGOGASTRODUODENOSCOPY (EGD) WITH PROPOFOL;  Surgeon: Lucilla Lame, MD;  Location: [facility] ENDOSCOPY;  Service: Endoscopy;  Laterality: N/A;  . GIVENS CAPSULE STUDY N/A 04/28/2016   Procedure: GIVENS CAPSULE STUDY;  Surgeon: [name], MD;  Location: [facility] ENDOSCOPY;  Service: Endoscopy;  Laterality: N/A;  . JOINT REPLACEMENT      Prior to Admission medications   Medication Sig Start Date End Date Taking? Authorizing Provider  acetaminophen (TYLENOL) 650 MG CR tablet Take 650 mg by mouth every 8 (eight) hours as needed for pain.   Yes Historical Provider, MD  albuterol (PROVENTIL HFA;VENTOLIN HFA) 108 (90 Base) MCG/ACT inhaler Inhale 2 puffs into the lungs every 4 (four) hours as needed for wheezing or shortness of breath.  Yes Historical Provider, MD  chlorthalidone (HYGROTON) 25 MG tablet Take 12.5 mg by mouth daily.  01/18/16  Yes Historical Provider, MD  COENZYME Q10 PO Take 1 tablet by mouth daily.   Yes Historical Provider, MD  colchicine 0.6 MG tablet  Take 0.6 mg by mouth daily.  08/28/13  Yes Historical Provider, MD  Cyanocobalamin (B-12) 1000 MCG CAPS Take 2,000 Units by mouth 2 (two) times daily.   Yes Historical Provider, MD  cycloSPORINE (RESTASIS) 0.05 % ophthalmic emulsion Place 1 drop into both eyes 2 (two) times daily.   Yes Historical Provider, MD  fluticasone (FLOVENT HFA) 110 MCG/ACT inhaler Inhale 1 puff into the lungs 2 (two) times daily.  01/12/16 10/18/16 Yes Historical Provider, MD  gabapentin (NEURONTIN) 300 MG capsule Take 300 mg by mouth 2 (two) times daily.   Yes Historical Provider, MD  guaiFENesin (MUCINEX) 600 MG 12 hr tablet Take 600 mg by mouth 2 (two) times daily as needed.   Yes Historical Provider, MD  hydrocortisone (PROCTOSOL HC) 2.5 % rectal cream PLACE RECTALLY TWICE A DAY 10/13/15  Yes Historical Provider, MD  latanoprost (XALATAN) 0.005 % ophthalmic solution Apply 1 drop to eye at bedtime.  01/17/16 01/16/17 Yes Historical Provider, MD  loratadine (CLARITIN) 10 MG tablet Take 10 mg by mouth daily as needed for allergies.   Yes Historical Provider, MD  losartan (COZAAR) 50 MG tablet Take 50 mg by mouth daily.   Yes Historical Provider, MD  Multiple Vitamin (MULTIVITAMIN) tablet Take 1 tablet by mouth daily.   Yes Historical Provider, MD  Multiple Vitamins-Minerals (OCUVITE PRESERVISION PO) Take 1 tablet by mouth 2 (two) times daily.   Yes Historical Provider, MD  PARoxetine (PAXIL) 40 MG tablet Take 40 mg by mouth every morning.   Yes Historical Provider, MD  Polyethyl Glycol-Propyl Glycol (SYSTANE) 0.4-0.3 % SOLN Apply 1 drop to eye as needed.   Yes Historical Provider, MD  potassium chloride (K-DUR,KLOR-CON) 10 MEQ tablet Take 10 mEq by mouth 2 (two) times daily.   Yes Historical Provider, MD  RABEprazole (ACIPHEX) 20 MG tablet Take 20 mg by mouth 2 (two) times daily.   Yes Historical Provider, MD  saccharomyces boulardii (FLORASTOR) 250 MG capsule Take 250 mg by mouth daily.   Yes Historical Provider, MD    Tetrahydroz-Dextran-PEG-Povid (EYE DROPS ADVANCED RELIEF OP) Apply to eye.   Yes Historical Provider, MD  timolol (TIMOPTIC-XR) 0.5 % ophthalmic gel-forming Place 1 drop into both eyes daily.   Yes Historical Provider, MD  valACYclovir (VALTREX) 1000 MG tablet once daily as needed. Reported on 03/22/2015 01/18/13  Yes Historical Provider, MD  sucralfate (CARAFATE) 1 g tablet Take 1 tablet (1 g total) by mouth 4 (four) times daily -  with meals and at bedtime. Patient not taking: Reported on 05/09/2016 08/13/15   Dustin Flock, MD     Allergies Codeine; Dextrans; Fentanyl; Lipitor [atorvastatin]; Mobic [meloxicam]; Niacin and related; Nsaids; Pravachol [pravastatin sodium]; Statins; Voltaren [diclofenac sodium]; and Zetia [ezetimibe]  Family History  Problem Relation Age of Onset  . CAD Mother   . CAD Father   . CAD Brother     Social History Social History  Substance Use Topics  . Smoking status: Former Smoker    Types: Cigarettes  . Smokeless tobacco: Never Used  . Alcohol use No    Review of Systems  Constitutional: No fever/chills Eyes: No visual changes.  ENT: No sore throat. Cardiovascular: Denies chest pain. Respiratory: Denies shortness of breath. Gastrointestinal: No abdominal  pain.  No nausea, no vomiting.   Genitourinary: Negative for dysuria. Musculoskeletal: Negative for back pain. Skin: Negative for rash. Neurological: Negative for headaches or weakness   ____________________________________________   PHYSICAL EXAM:  VITAL SIGNS: ED Triage Vitals  Enc Vitals Group     BP 05/09/16 1300 (!) 157/75     Pulse Rate 05/09/16 1300 91     Resp 05/09/16 1300 10     Temp 05/09/16 1301 97.6 F (36.4 C)     Temp Source 05/09/16 1301 Oral     SpO2 05/09/16 1300 97 %     Weight 05/09/16 1301 180 lb (81.6 kg)     Height 05/09/16 1301 5\' 9"  (1.753 m)     Head Circumference --      Peak Flow --      Pain Score 05/09/16 1301 0     Pain Loc --      Pain Edu? --       Excl. in Center? --     Constitutional: Alert and oriented. No acute distress. Pleasant and interactive Eyes: Conjunctivae are normal.   Nose: No congestion/rhinnorhea. Mouth/Throat: Mucous membranes are moist.    Cardiovascular: Normal rate, regular rhythm. Grossly normal heart sounds.  Good peripheral circulation. Respiratory: Normal respiratory effort.  No retractions. Lungs CTAB. Gastrointestinal: Soft and nontender. No distention.  No CVA tenderness. Genitourinary: deferred Musculoskeletal: No lower extremity tenderness nor edema.  Warm and well perfused Neurologic:  Normal speech and language. No gross focal neurologic deficits are appreciated.  Skin:  Skin is warm, dry and intact. No rash noted. Psychiatric: Mood and affect are normal. Speech and behavior are normal.  ____________________________________________   LABS (all labs ordered are listed, but only abnormal results are displayed)  Labs Reviewed  COMPREHENSIVE METABOLIC PANEL - Abnormal; Notable for the following:       Result Value   Potassium 3.4 (*)    Glucose, Bld 107 (*)    BUN 38 (*)    Calcium 8.8 (*)    All other components within normal limits  CBC - Abnormal; Notable for the following:    RBC 3.21 (*)    Hemoglobin 8.3 (*)    HCT 25.7 (*)    MCH 25.8 (*)    RDW 19.4 (*)    All other components within normal limits  LIPASE, BLOOD  TYPE AND SCREEN   ____________________________________________  EKG  ED ECG REPORT I, Lavonia Drafts, the attending physician, personally viewed and interpreted this ECG.  Date: 05/09/2016  Rate:90 Rhythm: normal sinus rhythm QRS Axis: normal Intervals: normal ST/T Wave abnormalities: normal Conduction Disturbances: none Narrative Interpretation: unremarkable  ____________________________________________  RADIOLOGY  None ____________________________________________   PROCEDURES  Procedure(s) performed: No    Critical Care performed:  No ____________________________________________   INITIAL IMPRESSION / ASSESSMENT AND PLAN / ED COURSE  Pertinent labs & imaging results that were available during my care of the patient were reviewed by me and considered in my medical decision making (see chart for details).  Patient's hgb not markedly changed from prior however she feels that she needs to have continued bowel movements. Discussed with Dr. Vicente Males, he recommends admission he will see the patient emergency department    ____________________________________________   FINAL CLINICAL IMPRESSION(S) / ED DIAGNOSES  Final diagnoses:  Gastrointestinal hemorrhage, unspecified gastrointestinal hemorrhage type      NEW MEDICATIONS STARTED DURING THIS VISIT:  New Prescriptions   No medications on file     Note:  This document  was prepared using Systems analyst and may include unintentional dictation errors.    Lavonia Drafts, MD 05/09/16 [id number]

## 2016-05-09 NOTE — H&P (Addendum)
[place] [facility] [place] NAME: [name] [name]    MR#:  [id number]  DATE OF BIRTH:  12-01-1925  DATE OF ADMISSION:  05/09/2016  PRIMARY CARE PHYSICIAN: Gearldine Shown, DO   REQUESTING/REFERRING PHYSICIAN: Lavonia Drafts, MD  CHIEF COMPLAINT:   Chief Complaint  Patient presents with  . Blood In Stools  . Diarrhea   Large amount of black stool today. HISTORY OF PRESENT ILLNESS:  [name] [name]  is a 83 y.o. female with a known history of GI bleeding with anemia, COPD, hypertension and GERD. The patient to present to the ED with above chief complaints. The patient has had rectal bleeding several times over the past year. She had colonoscopy and EGD which was unremarkable per patient. She also got capsule study recently, but she doesn't know the result. She complains of abdominal cramping, nausea, dizziness and weakness but denies any chest pain, palpitation. Hemoglobin decreased to 8.3. ED physician discussed with Dr. Vicente Males and requested admission.  PAST MEDICAL HISTORY:   Past Medical History:  Diagnosis Date  . Anemia   . COPD (chronic obstructive pulmonary disease) ([place])   . Diverticulitis   . GERD (gastroesophageal reflux disease)   . Glaucoma   . HLD (hyperlipidemia)   . Hypertension   . Iron deficiency   . Rectal bleeding     PAST SURGICAL HISTORY:   Past Surgical History:  Procedure Laterality Date  . ABDOMINAL HYSTERECTOMY    . APPENDECTOMY    . COLONOSCOPY WITH PROPOFOL N/A 04/14/2016   Procedure: COLONOSCOPY WITH PROPOFOL;  Surgeon: Lucilla Lame, MD;  Location: [facility] ENDOSCOPY;  Service: Endoscopy;  Laterality: N/A;  . ESOPHAGOGASTRODUODENOSCOPY (EGD) WITH PROPOFOL N/A 08/12/2015   Procedure: ESOPHAGOGASTRODUODENOSCOPY (EGD) WITH PROPOFOL;  Surgeon: Lollie Sails, MD;  Location: [facility] ENDOSCOPY;  Service: Endoscopy;  Laterality: N/A;  . ESOPHAGOGASTRODUODENOSCOPY (EGD) WITH PROPOFOL N/A 04/03/2016   Procedure:  ESOPHAGOGASTRODUODENOSCOPY (EGD) WITH PROPOFOL;  Surgeon: Lucilla Lame, MD;  Location: [facility] ENDOSCOPY;  Service: Endoscopy;  Laterality: N/A;  . GIVENS CAPSULE STUDY N/A 04/28/2016   Procedure: GIVENS CAPSULE STUDY;  Surgeon: [name], MD;  Location: [facility] ENDOSCOPY;  Service: Endoscopy;  Laterality: N/A;  . JOINT REPLACEMENT      SOCIAL HISTORY:   Social History  Substance Use Topics  . Smoking status: Former Smoker    Types: Cigarettes  . Smokeless tobacco: Never Used  . Alcohol use No    FAMILY HISTORY:   Family History  Problem Relation Age of Onset  . CAD Mother   . CAD Father   . CAD Brother     DRUG ALLERGIES:   Allergies  Allergen Reactions  . Codeine Nausea And Vomiting  . Dextrans Hives  . Fentanyl Itching  . Lipitor [Atorvastatin] Other (See Comments)    Reaction: Muscle pain  . Mobic [Meloxicam] Other (See Comments)    Reaction: Mouth and tongue ulcers  . Niacin And Related Itching  . Nsaids Hives  . Pravachol [Pravastatin Sodium] Other (See Comments)    Reaction: Muscle pain  . Statins   . Voltaren [Diclofenac Sodium] Other (See Comments)    Reaction: Mouth and tongue ulcers  . Zetia [Ezetimibe] Other (See Comments)    Reaction: Leg pain    REVIEW OF SYSTEMS:   Review of Systems  Constitutional: Positive for malaise/fatigue. Negative for chills and fever.  HENT: Negative for congestion.   Eyes: Negative for blurred vision and double vision.  Respiratory: Negative for cough, shortness  of breath, wheezing and stridor.   Cardiovascular: Negative for chest pain, palpitations and leg swelling.  Gastrointestinal: Positive for abdominal pain and melena. Negative for blood in stool, diarrhea, nausea and vomiting.  Genitourinary: Negative for dysuria and hematuria.  Musculoskeletal: Negative for back pain.  Skin: Negative for itching and rash.  Neurological: Positive for dizziness and weakness. Negative for focal weakness, loss of consciousness and  headaches.  Endo/Heme/Allergies: Does not bruise/bleed easily.  Psychiatric/Behavioral: Negative for depression. The patient is not nervous/anxious.     MEDICATIONS AT HOME:   Prior to Admission medications   Medication Sig Start Date End Date Taking? Authorizing Provider  acetaminophen (TYLENOL) 650 MG CR tablet Take 650 mg by mouth every 8 (eight) hours as needed for pain.   Yes Historical Provider, MD  albuterol (PROVENTIL HFA;VENTOLIN HFA) 108 (90 Base) MCG/ACT inhaler Inhale 2 puffs into the lungs every 4 (four) hours as needed for wheezing or shortness of breath.   Yes Historical Provider, MD  chlorthalidone (HYGROTON) 25 MG tablet Take 12.5 mg by mouth daily.  01/18/16  Yes Historical Provider, MD  COENZYME Q10 PO Take 1 tablet by mouth daily.   Yes Historical Provider, MD  colchicine 0.6 MG tablet Take 0.6 mg by mouth daily.  08/28/13  Yes Historical Provider, MD  Cyanocobalamin (B-12) 1000 MCG CAPS Take 2,000 Units by mouth 2 (two) times daily.   Yes Historical Provider, MD  cycloSPORINE (RESTASIS) 0.05 % ophthalmic emulsion Place 1 drop into both eyes 2 (two) times daily.   Yes Historical Provider, MD  fluticasone (FLOVENT HFA) 110 MCG/ACT inhaler Inhale 1 puff into the lungs 2 (two) times daily.  01/12/16 10/18/16 Yes Historical Provider, MD  gabapentin (NEURONTIN) 300 MG capsule Take 300 mg by mouth 2 (two) times daily.   Yes Historical Provider, MD  guaiFENesin (MUCINEX) 600 MG 12 hr tablet Take 600 mg by mouth 2 (two) times daily as needed.   Yes Historical Provider, MD  hydrocortisone (PROCTOSOL HC) 2.5 % rectal cream PLACE RECTALLY TWICE A DAY 10/13/15  Yes Historical Provider, MD  latanoprost (XALATAN) 0.005 % ophthalmic solution Apply 1 drop to eye at bedtime.  01/17/16 01/16/17 Yes Historical Provider, MD  loratadine (CLARITIN) 10 MG tablet Take 10 mg by mouth daily as needed for allergies.   Yes Historical Provider, MD  losartan (COZAAR) 50 MG tablet Take 50 mg by mouth daily.   Yes  Historical Provider, MD  Multiple Vitamin (MULTIVITAMIN) tablet Take 1 tablet by mouth daily.   Yes Historical Provider, MD  Multiple Vitamins-Minerals (OCUVITE PRESERVISION PO) Take 1 tablet by mouth 2 (two) times daily.   Yes Historical Provider, MD  PARoxetine (PAXIL) 40 MG tablet Take 40 mg by mouth every morning.   Yes Historical Provider, MD  Polyethyl Glycol-Propyl Glycol (SYSTANE) 0.4-0.3 % SOLN Apply 1 drop to eye as needed.   Yes Historical Provider, MD  potassium chloride (K-DUR,KLOR-CON) 10 MEQ tablet Take 10 mEq by mouth 2 (two) times daily.   Yes Historical Provider, MD  RABEprazole (ACIPHEX) 20 MG tablet Take 20 mg by mouth 2 (two) times daily.   Yes Historical Provider, MD  saccharomyces boulardii (FLORASTOR) 250 MG capsule Take 250 mg by mouth daily.   Yes Historical Provider, MD  Tetrahydroz-Dextran-PEG-Povid (EYE DROPS ADVANCED RELIEF OP) Apply to eye.   Yes Historical Provider, MD  timolol (TIMOPTIC-XR) 0.5 % ophthalmic gel-forming Place 1 drop into both eyes daily.   Yes Historical Provider, MD  valACYclovir (VALTREX) 1000 MG tablet  once daily as needed. Reported on 03/22/2015 01/18/13  Yes Historical Provider, MD  sucralfate (CARAFATE) 1 g tablet Take 1 tablet (1 g total) by mouth 4 (four) times daily -  with meals and at bedtime. Patient not taking: Reported on 05/09/2016 08/13/15   Dustin Flock, MD      VITAL SIGNS:  Blood pressure (!) 154/65, pulse 89, temperature 97.6 F (36.4 C), temperature source Oral, resp. rate 16, height 5\' 9"  (1.753 m), weight 180 lb (81.6 kg), SpO2 100 %.  PHYSICAL EXAMINATION:  Physical Exam  GENERAL:  83 y.o.-year-old patient lying in the bed with no acute distress.  EYES: Pupils equal, round, reactive to light and accommodation. No scleral icterus. Extraocular muscles intact.  HEENT: Head atraumatic, normocephalic. Oropharynx and nasopharynx clear. Moist oral mucosa. NECK:  Supple, no jugular venous distention. No thyroid enlargement, no  tenderness.  LUNGS: Normal breath sounds bilaterally, no wheezing, rales,rhonchi or crepitation. No use of accessory muscles of respiration.  CARDIOVASCULAR: S1, S2 normal. No murmurs, rubs, or gallops.  ABDOMEN: Soft, nontender, nondistended. Bowel sounds present. No organomegaly or mass.  EXTREMITIES: No pedal edema, cyanosis, or clubbing.  NEUROLOGIC: Cranial nerves II through XII are intact. Muscle strength 5/5 in all extremities. Sensation intact. Gait not checked.  PSYCHIATRIC: The patient is alert and oriented x 3.  SKIN: No obvious rash, lesion, or ulcer.   LABORATORY PANEL:   CBC  Recent Labs Lab 05/09/16 1300  WBC 10.2  HGB 8.3*  HCT 25.7*  PLT 357   ------------------------------------------------------------------------------------------------------------------  Chemistries   Recent Labs Lab 05/09/16 1300  NA 139  K 3.4*  CL 103  CO2 27  GLUCOSE 107*  BUN 38*  CREATININE 0.61  CALCIUM 8.8*  AST 26  ALT 16  ALKPHOS 56  BILITOT 0.4   ------------------------------------------------------------------------------------------------------------------  Cardiac Enzymes No results for input(s): TROPONINI in the last 168 hours. ------------------------------------------------------------------------------------------------------------------  RADIOLOGY:  No results found.    IMPRESSION AND PLAN:   GIB. The patient will be admitted to medical floor, follow-up Hb Q6h, protonix iv bid, f/u GI consult. Nothing by mouthevery fluid support.  Anemia of chronic disease and acute blood loss.  follow-up Hb Q6h, protonix iv bid  Hypokalemia. Give potassium supplement with IV fluid. Follow up BMP.  COPD. Stable, nebulizer when necessary. All the records are reviewed and case discussed with ED provider. Management plans discussed with the patient, family and they are in agreement.  CODE STATUS: Full code  TOTAL TIME TAKING CARE OF THIS PATIENT: 55 minutes.     Demetrios Loll M.D on 05/09/2016 at 5:14 PM  Between 7am to 6pm - Pager - [phone]  After 6pm go to [email or web address] - Proofreader  [facility]  Office  [phone]  CC: Primary care physician; Gearldine Shown, DO   Note: This dictation was prepared with Dragon dictation along with smaller phrase technology. Any transcriptional errors that result from this process are unintentional.

## 2016-05-09 NOTE — ED Notes (Signed)
Assisted pt to bathroom to void

## 2016-05-09 NOTE — Consult Note (Signed)
[name] MD  [address] [place], [place] [address] Phone: [phone] Fax : [phone]  Consultation  Referring Provider:     No ref. provider found Primary Care Physician:  Gearldine Shown, DO Primary Gastroenterologist:  Dr. Vicente Males         Reason for Consultation:     GI bleed   Date of Admission:  05/09/2016 Date of Consultation:  05/09/2016         HPI:   [name] [name] is a 83 y.o. female is a patient well known to myself. She was recently seen at my office on 04/19/16  Summary of history :  She was initially admitted on 04/01/16 when she presented with melena . She has a history of iron deficiency anemia. She had an EGD which was normal except for a small hiatal hernia. She was discharged and readmitted on /4/18 with black stools. She had a colonoscopy which did not reveal any source of bleeding  She was doing well after discharge when all of a sudden this morning had multiple black tarry bowel movements associated with abdominal cramping. Denies use of any blood thinners.   I was called from the ER about her arrival and she had just undergone a capsule study of the small bowel which I just interpreted and noted she to have active bleeding from the proximal small bowel ie the jejunum.    Past Medical History:  Diagnosis Date  . Anemia   . COPD (chronic obstructive pulmonary disease) ([place])   . Diverticulitis   . GERD (gastroesophageal reflux disease)   . Glaucoma   . HLD (hyperlipidemia)   . Hypertension   . Iron deficiency   . Rectal bleeding     Past Surgical History:  Procedure Laterality Date  . ABDOMINAL HYSTERECTOMY    . APPENDECTOMY    . COLONOSCOPY WITH PROPOFOL N/A 04/14/2016   Procedure: COLONOSCOPY WITH PROPOFOL;  Surgeon: Lucilla Lame, MD;  Location: [facility] ENDOSCOPY;  Service: Endoscopy;  Laterality: N/A;  . ESOPHAGOGASTRODUODENOSCOPY (EGD) WITH PROPOFOL N/A 08/12/2015   Procedure: ESOPHAGOGASTRODUODENOSCOPY (EGD) WITH PROPOFOL;  Surgeon:  Lollie Sails, MD;  Location: [facility] ENDOSCOPY;  Service: Endoscopy;  Laterality: N/A;  . ESOPHAGOGASTRODUODENOSCOPY (EGD) WITH PROPOFOL N/A 04/03/2016   Procedure: ESOPHAGOGASTRODUODENOSCOPY (EGD) WITH PROPOFOL;  Surgeon: Lucilla Lame, MD;  Location: [facility] ENDOSCOPY;  Service: Endoscopy;  Laterality: N/A;  . GIVENS CAPSULE STUDY N/A 04/28/2016   Procedure: GIVENS CAPSULE STUDY;  Surgeon: [name], MD;  Location: [facility] ENDOSCOPY;  Service: Endoscopy;  Laterality: N/A;  . JOINT REPLACEMENT      Prior to Admission medications   Medication Sig Start Date End Date Taking? Authorizing Provider  acetaminophen (TYLENOL) 650 MG CR tablet Take 650 mg by mouth every 8 (eight) hours as needed for pain.   Yes Historical Provider, MD  albuterol (PROVENTIL HFA;VENTOLIN HFA) 108 (90 Base) MCG/ACT inhaler Inhale 2 puffs into the lungs every 4 (four) hours as needed for wheezing or shortness of breath.   Yes Historical Provider, MD  chlorthalidone (HYGROTON) 25 MG tablet Take 12.5 mg by mouth daily.  01/18/16  Yes Historical Provider, MD  COENZYME Q10 PO Take 1 tablet by mouth daily.   Yes Historical Provider, MD  colchicine 0.6 MG tablet Take 0.6 mg by mouth daily.  08/28/13  Yes Historical Provider, MD  Cyanocobalamin (B-12) 1000 MCG CAPS Take 2,000 Units by mouth 2 (two) times daily.   Yes Historical Provider, MD  cycloSPORINE (RESTASIS) 0.05 % ophthalmic emulsion Place 1  drop into both eyes 2 (two) times daily.   Yes Historical Provider, MD  fluticasone (FLOVENT HFA) 110 MCG/ACT inhaler Inhale 1 puff into the lungs 2 (two) times daily.  01/12/16 10/18/16 Yes Historical Provider, MD  gabapentin (NEURONTIN) 300 MG capsule Take 300 mg by mouth 2 (two) times daily.   Yes Historical Provider, MD  guaiFENesin (MUCINEX) 600 MG 12 hr tablet Take 600 mg by mouth 2 (two) times daily as needed.   Yes Historical Provider, MD  hydrocortisone (PROCTOSOL HC) 2.5 % rectal cream PLACE RECTALLY TWICE A DAY 10/13/15  Yes Historical  Provider, MD  latanoprost (XALATAN) 0.005 % ophthalmic solution Apply 1 drop to eye at bedtime.  01/17/16 01/16/17 Yes Historical Provider, MD  loratadine (CLARITIN) 10 MG tablet Take 10 mg by mouth daily as needed for allergies.   Yes Historical Provider, MD  losartan (COZAAR) 50 MG tablet Take 50 mg by mouth daily.   Yes Historical Provider, MD  Multiple Vitamin (MULTIVITAMIN) tablet Take 1 tablet by mouth daily.   Yes Historical Provider, MD  Multiple Vitamins-Minerals (OCUVITE PRESERVISION PO) Take 1 tablet by mouth 2 (two) times daily.   Yes Historical Provider, MD  PARoxetine (PAXIL) 40 MG tablet Take 40 mg by mouth every morning.   Yes Historical Provider, MD  Polyethyl Glycol-Propyl Glycol (SYSTANE) 0.4-0.3 % SOLN Apply 1 drop to eye as needed.   Yes Historical Provider, MD  potassium chloride (K-DUR,KLOR-CON) 10 MEQ tablet Take 10 mEq by mouth 2 (two) times daily.   Yes Historical Provider, MD  RABEprazole (ACIPHEX) 20 MG tablet Take 20 mg by mouth 2 (two) times daily.   Yes Historical Provider, MD  saccharomyces boulardii (FLORASTOR) 250 MG capsule Take 250 mg by mouth daily.   Yes Historical Provider, MD  Tetrahydroz-Dextran-PEG-Povid (EYE DROPS ADVANCED RELIEF OP) Apply to eye.   Yes Historical Provider, MD  timolol (TIMOPTIC-XR) 0.5 % ophthalmic gel-forming Place 1 drop into both eyes daily.   Yes Historical Provider, MD  valACYclovir (VALTREX) 1000 MG tablet once daily as needed. Reported on 03/22/2015 01/18/13  Yes Historical Provider, MD  sucralfate (CARAFATE) 1 g tablet Take 1 tablet (1 g total) by mouth 4 (four) times daily -  with meals and at bedtime. Patient not taking: Reported on 05/09/2016 08/13/15   Dustin Flock, MD    Family History  Problem Relation Age of Onset  . CAD Mother   . CAD Father   . CAD Brother      Social History  Substance Use Topics  . Smoking status: Former Smoker    Types: Cigarettes  . Smokeless tobacco: Never Used  . Alcohol use No    Allergies  as of 05/09/2016 - Review Complete 05/09/2016  Allergen Reaction Noted  . Codeine Nausea And Vomiting 08/10/2015  . Dextrans Hives 08/10/2015  . Fentanyl Itching 08/10/2015  . Lipitor [atorvastatin] Other (See Comments) 08/10/2015  . Mobic [meloxicam] Other (See Comments) 08/10/2015  . Niacin and related Itching 08/10/2015  . Nsaids Hives 08/10/2015  . Pravachol [pravastatin sodium] Other (See Comments) 08/10/2015  . Statins  03/31/2016  . Voltaren [diclofenac sodium] Other (See Comments) 08/10/2015  . Zetia [ezetimibe] Other (See Comments) 08/10/2015    Review of Systems:    All systems reviewed and negative except where noted in HPI.   Physical Exam:  Vital signs in last 24 hours: Temp:  [97.6 F (36.4 C)] 97.6 F (36.4 C) (05/01 1301) Pulse Rate:  [77-92] 89 (05/01 1500) Resp:  [9-18] 16 (  05/01 1500) BP: (131-157)/(61-75) 154/65 (05/01 1500) SpO2:  [97 %-100 %] 100 % (05/01 1500) Weight:  [180 lb (81.6 kg)] 180 lb (81.6 kg) (05/01 1301)   General:   Pleasant, cooperative in NAD Head:  Normocephalic and atraumatic. Eyes:   No icterus.   Conjunctiva pink. PERRLA. Ears:  Normal auditory acuity. Neck:  Supple; no masses or thyroidomegaly Lungs: Respirations even and unlabored. Lungs clear to auscultation bilaterally.   No wheezes, crackles, or rhonchi.  Heart:  Regular rate and rhythm;  Without murmur, clicks, rubs or gallops Abdomen:  Soft, mildly distended , nontender. Normal bowel sounds. No appreciable masses or hepatomegaly.  No rebound or guarding.  Rectal:  Not performed. Extremities:  Without edema, cyanosis or clubbing. Neurologic:  Alert and oriented x3;  grossly normal neurologically. Psych:  Alert and cooperative. Normal affect.  LAB RESULTS:  Recent Labs  05/09/16 1300  WBC 10.2  HGB 8.3*  HCT 25.7*  PLT 357   BMET  Recent Labs  05/09/16 1300  NA 139  K 3.4*  CL 103  CO2 27  GLUCOSE 107*  BUN 38*  CREATININE 0.61  CALCIUM 8.8*    LFT  Recent Labs  05/09/16 1300  PROT 6.7  ALBUMIN 3.6  AST 26  ALT 16  ALKPHOS 56  BILITOT 0.4   PT/INR No results for input(s): LABPROT, INR in the last 72 hours.  STUDIES: No results found.    Impression / Plan:   [name] [name] is a 83 y.o. y/o female with a long standing history of  melena and iron deficiency anemia. So far EGD+colonoscopy have been negative. Returns to the ER today with black tarry stool. I subsequently interpreted her capsule study which she just underwent a few days back and I noted active bleeding from a single spot in the proximal jejunum.   Plan   1. Monitor CBC and transfuse 2. Push enteroscopy in the morning to ablate the bleeding site.  3. PPI 4. NPO  Thank you for involving me in the care of this patient.      LOS: 0 days   [name], MD  05/09/2016, 3:58 PM

## 2016-05-10 ENCOUNTER — Inpatient Hospital Stay: Payer: Medicare Other | Admitting: Anesthesiology

## 2016-05-10 ENCOUNTER — Encounter
Admission: EM | Disposition: A | Discharge status: Home / Self Care | Payer: Self-pay | Source: Home / Self Care | Attending: Internal Medicine

## 2016-05-10 ENCOUNTER — Encounter: Payer: Self-pay | Admitting: *Deleted

## 2016-05-10 DIAGNOSIS — K5520 Angiodysplasia of colon without hemorrhage: Secondary | ICD-10-CM

## 2016-05-10 HISTORY — PX: ENTEROSCOPY: SHX5533

## 2016-05-10 LAB — BASIC METABOLIC PANEL
ANION GAP: 6 (ref 5–15)
BUN: 37 mg/dL — AB (ref 6–20)
CO2: 28 mmol/L (ref 22–32)
Calcium: 8.4 mg/dL — ABNORMAL LOW (ref 8.9–10.3)
Chloride: 104 mmol/L (ref 101–111)
Creatinine, Ser: 0.61 mg/dL (ref 0.44–1.00)
GFR calc Af Amer: >60 mL/min (ref >60)
GFR calc non Af Amer: >60 mL/min (ref >60)
GLUCOSE: 96 mg/dL (ref 65–99)
POTASSIUM: 3 mmol/L — AB (ref 3.5–5.1)
Sodium: 138 mmol/L (ref 135–145)

## 2016-05-10 LAB — CBC
HEMATOCRIT: 22.4 % — AB (ref 35.0–47.0)
HEMOGLOBIN: 7.2 g/dL — AB (ref 12.0–16.0)
MCH: 25.4 pg — AB (ref 26.0–34.0)
MCHC: 32.1 g/dL (ref 32.0–36.0)
MCV: 79.4 fL — AB (ref 80.0–100.0)
Platelets: 326 10*3/uL (ref 150–440)
RBC: 2.83 MIL/uL — ABNORMAL LOW (ref 3.80–5.20)
RDW: 19.6 % — AB (ref 11.5–14.5)
WBC: 7.6 10*3/uL (ref 3.6–11.0)

## 2016-05-10 LAB — HEMOGLOBIN: Hemoglobin: 7.3 g/dL — ABNORMAL LOW (ref 12.0–16.0)

## 2016-05-10 SURGERY — ENTEROSCOPY
Anesthesia: General

## 2016-05-10 MED ORDER — PROPOFOL 10 MG/ML IV BOLUS
INTRAVENOUS | Status: AC
  Filled 2016-05-10: qty 40

## 2016-05-10 MED ORDER — PROPOFOL 10 MG/ML IV BOLUS
INTRAVENOUS | Status: DC | PRN
  Administered 2016-05-10 (×2): 20 mg via INTRAVENOUS
  Administered 2016-05-10: 30 mg via INTRAVENOUS

## 2016-05-10 MED ORDER — POTASSIUM CHLORIDE CRYS ER 20 MEQ PO TBCR
40.0000 meq | EXTENDED_RELEASE_TABLET | Freq: Once | ORAL | Status: AC
  Administered 2016-05-10: 40 meq via ORAL
  Filled 2016-05-10: qty 2

## 2016-05-10 MED ORDER — TIMOLOL MALEATE 0.5 % OP SOLG
1.0000 [drp] | Freq: Every day | OPHTHALMIC | Status: DC
  Administered 2016-05-10 – 2016-05-11 (×2): 1 [drp] via OPHTHALMIC

## 2016-05-10 MED ORDER — LIDOCAINE HCL (CARDIAC) 20 MG/ML IV SOLN
INTRAVENOUS | Status: DC | PRN
  Administered 2016-05-10: 60 mg via INTRAVENOUS

## 2016-05-10 MED ORDER — SODIUM CHLORIDE 0.9 % IV SOLN
INTRAVENOUS | Status: DC
  Administered 2016-05-10: 13:00:00 via INTRAVENOUS

## 2016-05-10 MED ORDER — PHENOL 1.4 % MT LIQD
1.0000 | OROMUCOSAL | Status: DC | PRN
  Administered 2016-05-10: 1 via OROMUCOSAL
  Filled 2016-05-10: qty 177

## 2016-05-10 MED ORDER — LATANOPROST 0.005 % OP SOLN
1.0000 [drp] | Freq: Every morning | OPHTHALMIC | Status: DC
  Administered 2016-05-11 – 2016-05-12 (×2): 1 [drp] via OPHTHALMIC

## 2016-05-10 NOTE — Progress Notes (Signed)
[facility] [place] NAME: [name] [name]    MR#:  [id number]  DATE OF BIRTH:  Aug 28, 1925  SUBJECTIVE:   Patient came in with black tarry stools multiple episodes. She is status post enteroscopy. Doing okay denies any complaints. REVIEW OF SYSTEMS:   Review of Systems  Constitutional: Negative for chills, fever and weight loss.  HENT: Negative for ear discharge, ear pain and nosebleeds.   Eyes: Negative for blurred vision, pain and discharge.  Respiratory: Negative for sputum production, shortness of breath, wheezing and stridor.   Cardiovascular: Negative for chest pain, palpitations, orthopnea and PND.  Gastrointestinal: Positive for melena. Negative for abdominal pain, diarrhea, nausea and vomiting.  Genitourinary: Negative for frequency and urgency.  Musculoskeletal: Negative for back pain and joint pain.  Neurological: Positive for weakness. Negative for sensory change, speech change and focal weakness.  Psychiatric/Behavioral: Negative for depression and hallucinations. The patient is not nervous/anxious.    Tolerating Diet:yes Tolerating PT: pending  DRUG ALLERGIES:   Allergies  Allergen Reactions  . Codeine Nausea And Vomiting  . Dextrans Hives  . Fentanyl Itching  . Lipitor [Atorvastatin] Other (See Comments)    Reaction: Muscle pain  . Mobic [Meloxicam] Other (See Comments)    Reaction: Mouth and tongue ulcers  . Niacin And Related Itching  . Nsaids Hives  . Pravachol [Pravastatin Sodium] Other (See Comments)    Reaction: Muscle pain  . Statins   . Voltaren [Diclofenac Sodium] Other (See Comments)    Reaction: Mouth and tongue ulcers  . Zetia [Ezetimibe] Other (See Comments)    Reaction: Leg pain    VITALS:  Blood pressure (!) 142/68, pulse 74, temperature 97.9 F (36.6 C), temperature source Oral, resp. rate 16, height 5\' 9"  (1.753 m), weight 81.6 kg (180 lb), SpO2 97 %.  PHYSICAL EXAMINATION:    Physical Exam  GENERAL:  83 y.o.-year-old patient lying in the bed with no acute distress. weak EYES: Pupils equal, round, reactive to light and accommodation. No scleral icterus. Extraocular muscles intact.  HEENT: Head atraumatic, normocephalic. Oropharynx and nasopharynx clear. Pallor++ NECK:  Supple, no jugular venous distention. No thyroid enlargement, no tenderness.  LUNGS: Normal breath sounds bilaterally, no wheezing, rales, rhonchi. No use of accessory muscles of respiration.  CARDIOVASCULAR: S1, S2 normal. No murmurs, rubs, or gallops.  ABDOMEN: Soft, nontender, nondistended. Bowel sounds present. No organomegaly or mass.  EXTREMITIES: No cyanosis, clubbing or edema b/l.    NEUROLOGIC: Cranial nerves II through XII are intact. No focal Motor or sensory deficits b/l.   PSYCHIATRIC:  patient is alert and oriented x 3.  SKIN: No obvious rash, lesion, or ulcer.   LABORATORY PANEL:  CBC  Recent Labs Lab 05/10/16 0058 05/10/16 0729  WBC 7.6  --   HGB 7.2* 7.3*  HCT 22.4*  --   PLT 326  --     Chemistries   Recent Labs Lab 05/09/16 1300 05/10/16 0058  NA 139 138  K 3.4* 3.0*  CL 103 104  CO2 27 28  GLUCOSE 107* 96  BUN 38* 37*  CREATININE 0.61 0.61  CALCIUM 8.8* 8.4*  AST 26  --   ALT 16  --   ALKPHOS 56  --   BILITOT 0.4  --    Cardiac Enzymes No results for input(s): TROPONINI in the last 168 hours. RADIOLOGY:  No results found. ASSESSMENT AND PLAN:   [name] [name]  is a 83 y.o. female with a known  history of GI bleeding with anemia, COPD, hypertension and GERD. The patient to present to the ED with above chief complaints. The patient has had rectal bleeding several times over the past year. She had colonoscopy and EGD which was unremarkable per patient. She also got capsule study recently  1. 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 Hypertension -Continue home meds  4. Glaucoma Continue eyedrops     Case discussed with Care Management/Social Worker. Management plans discussed with the patient, family and they are in agreement.  CODE STATUS: Full  DVT Prophylaxis: SCD  SCDTOTAL TIME TAKING CARE OF THIS PATIENT: *30** minutes.  >50% time spent on counselling and coordination of care  POSSIBLE D/C IN 1-2* DAYS, DEPENDING ON CLINICAL CONDITION.  Note: This dictation was prepared with Dragon dictation along with smaller phrase technology. Any transcriptional errors that result from this process are unintentional.  Kelani Robart M.D on 05/10/2016 at 2:58 PM  Between 7am to 6pm - Pager - [phone]  After 6pm go to [email or web address] - password [email or web address] [facility]  Office  [phone]  CC: Primary care physician; Arrie Aran PATRICIA, DO

## 2016-05-10 NOTE — Anesthesia Postprocedure Evaluation (Signed)
Anesthesia Post Note  Patient: [name] [name]  Procedure(s) Performed: Procedure(s) (LRB): Push ENTEROSCOPY with pediatric colonoscope (N/A)  Patient location during evaluation: PACU Anesthesia Type: General Level of consciousness: awake and alert and oriented Pain management: pain level controlled Vital Signs Assessment: post-procedure vital signs reviewed and stable Respiratory status: spontaneous breathing Cardiovascular status: blood pressure returned to baseline Anesthetic complications: no     Last Vitals:  Vitals:   05/10/16 1107 05/10/16 1311  BP:  136/64  Pulse: 78 87  Resp: 19 13  Temp: 36.8 C (!) 36 C    Last Pain:  Vitals:   05/10/16 1311  TempSrc: Tympanic  PainSc:                  Corinne Goucher

## 2016-05-10 NOTE — Anesthesia Preprocedure Evaluation (Signed)
Anesthesia Evaluation  Patient identified by MRN, date of birth, ID band Patient awake    Reviewed: Allergy & Precautions, NPO status , Patient's Chart, lab work & pertinent test results, reviewed documented beta blocker date and time   Airway Mallampati: II  TM Distance: >3 FB     Dental  (+) Chipped   Pulmonary pneumonia, resolved, COPD, former smoker,    Pulmonary exam normal        Cardiovascular hypertension, Pt. on medications and Pt. on home beta blockers Normal cardiovascular exam     Neuro/Psych PSYCHIATRIC DISORDERS Depression    GI/Hepatic Neg liver ROS, PUD, GERD  ,  Endo/Other  negative endocrine ROS  Renal/GU negative Renal ROS     Musculoskeletal  (+) Arthritis , Osteoarthritis,  Fibromyalgia -  Abdominal   Peds  Hematology  (+) anemia ,   Anesthesia Other Findings Gout. Will avoid fentanyl.  Reproductive/Obstetrics                             Anesthesia Physical  Anesthesia Plan  ASA: III  Anesthesia Plan: General   Post-op Pain Management:    Induction: Intravenous  Airway Management Planned: Nasal Cannula  Additional Equipment:   Intra-op Plan:   Post-operative Plan:   Informed Consent: I have reviewed the patients History and Physical, chart, labs and discussed the procedure including the risks, benefits and alternatives for the proposed anesthesia with the patient or authorized representative who has indicated his/her understanding and acceptance.     Plan Discussed with: CRNA  Anesthesia Plan Comments:         Anesthesia Quick Evaluation

## 2016-05-10 NOTE — Op Note (Signed)
[facility] Patient Name: [name] [name] Procedure Date: 05/10/2016 12:35 PM MRN: [id number] Account #: [id number] Date of Birth: 05-18-1925 Admit Type: Inpatient Age: 83 Room: [facility] ENDO ROOM 1 Gender: Female Note Status: Finalized Procedure:            Small bowel enteroscopy Indications:          Arteriovenous malformation in the small intestine Providers:            [name] MD, MD Medicines:            Monitored Anesthesia Care Complications:        No immediate complications. Procedure:            Pre-Anesthesia Assessment:                       - Prior to the procedure, a History and Physical was                        performed, and patient medications, allergies and                        sensitivities were reviewed. The patient's tolerance of                        previous anesthesia was reviewed.                       - The risks and benefits of the procedure and the                        sedation options and risks were discussed with the                        patient. All questions were answered and informed                        consent was obtained.                       - ASA Grade Assessment: III - A patient with severe                        systemic disease.                       After obtaining informed consent, the endoscope was                        passed under direct vision. Throughout the procedure,                        the patient's blood pressure, pulse, and oxygen                        saturations were monitored continuously. The                        Colonoscope was introduced through the mouth and                        advanced to the proximal jejunum. The small bowel  enteroscopy was accomplished with ease. The patient                        tolerated the procedure well. Findings:      Three angioectasias with no bleeding were found in the proximal jejunum.       Fulguration to ablate  the lesion to prevent bleeding by argon plasma at       0.5 liters/minute and 20 watts was successful.      The examined duodenum was normal.      The stomach was normal.      The esophagus was normal. Impression:           - Three non-bleeding angioectasias in the jejunum.                        Treated with argon plasma coagulation (APC).                       - Normal examined duodenum.                       - Normal stomach.                       - Normal esophagus.                       - No specimens collected. Recommendation:       - Return patient to hospital ward for possible                        discharge same day.                       - Advance diet as tolerated and clear liquid diet today.                       - Use Prilosec (omeprazole) 40 mg PO daily for 6 weeks. Procedure Code(s):    --- Professional ---                       [phone], Small intestinal endoscopy, enteroscopy beyond                        second portion of duodenum, not including ileum; with                        control of bleeding (eg, injection, bipolar cautery,                        unipolar cautery, laser, heater probe, stapler, plasma                        coagulator) Diagnosis Code(s):    --- Professional ---                       K55.20, Angiodysplasia of colon without hemorrhage                       Q27.33, Arteriovenous malformation of digestive system  vessel CPT copyright 2016 [employer]. All rights reserved. The codes documented in this report are preliminary and upon coder review may  be revised to meet current compliance requirements. [name], MD [name] MD, MD 05/10/2016 1:08:18 PM This report has been signed electronically. Number of Addenda: 0 Note Initiated On: 05/10/2016 12:35 PM      [facility]

## 2016-05-10 NOTE — H&P (Signed)
[name] MD [address] [place], [place] [address] Phone: [phone] Fax : [phone]  Primary Care Physician:  Gearldine Shown, DO Primary Gastroenterologist:  Dr. [name]   Pre-Procedure History & Physical: HPI:  [name] [name] is a 83 y.o. female is here for an push enteroscopy .   Past Medical History:  Diagnosis Date  . Anemia   . COPD (chronic obstructive pulmonary disease) ([place])   . Diverticulitis   . GERD (gastroesophageal reflux disease)   . Glaucoma   . HLD (hyperlipidemia)   . Hypertension   . Iron deficiency   . Rectal bleeding     Past Surgical History:  Procedure Laterality Date  . ABDOMINAL HYSTERECTOMY    . APPENDECTOMY    . COLONOSCOPY WITH PROPOFOL N/A 04/14/2016   Procedure: COLONOSCOPY WITH PROPOFOL;  Surgeon: Lucilla Lame, MD;  Location: [facility] ENDOSCOPY;  Service: Endoscopy;  Laterality: N/A;  . ESOPHAGOGASTRODUODENOSCOPY (EGD) WITH PROPOFOL N/A 08/12/2015   Procedure: ESOPHAGOGASTRODUODENOSCOPY (EGD) WITH PROPOFOL;  Surgeon: Lollie Sails, MD;  Location: [facility] ENDOSCOPY;  Service: Endoscopy;  Laterality: N/A;  . ESOPHAGOGASTRODUODENOSCOPY (EGD) WITH PROPOFOL N/A 04/03/2016   Procedure: ESOPHAGOGASTRODUODENOSCOPY (EGD) WITH PROPOFOL;  Surgeon: Lucilla Lame, MD;  Location: [facility] ENDOSCOPY;  Service: Endoscopy;  Laterality: N/A;  . GIVENS CAPSULE STUDY N/A 04/28/2016   Procedure: GIVENS CAPSULE STUDY;  Surgeon: [name], MD;  Location: [facility] ENDOSCOPY;  Service: Endoscopy;  Laterality: N/A;  . JOINT REPLACEMENT      Prior to Admission medications   Medication Sig Start Date End Date Taking? Authorizing Provider  acetaminophen (TYLENOL) 650 MG CR tablet Take 650 mg by mouth every 8 (eight) hours as needed for pain.   Yes Historical Provider, MD  albuterol (PROVENTIL HFA;VENTOLIN HFA) 108 (90 Base) MCG/ACT inhaler Inhale 2 puffs into the lungs every 4 (four) hours as needed for wheezing or shortness of breath.   Yes  Historical Provider, MD  chlorthalidone (HYGROTON) 25 MG tablet Take 12.5 mg by mouth daily.  01/18/16  Yes Historical Provider, MD  COENZYME Q10 PO Take 1 tablet by mouth daily.   Yes Historical Provider, MD  colchicine 0.6 MG tablet Take 0.6 mg by mouth daily.  08/28/13  Yes Historical Provider, MD  Cyanocobalamin (B-12) 1000 MCG CAPS Take 2,000 Units by mouth 2 (two) times daily.   Yes Historical Provider, MD  cycloSPORINE (RESTASIS) 0.05 % ophthalmic emulsion Place 1 drop into both eyes 2 (two) times daily.   Yes Historical Provider, MD  fluticasone (FLOVENT HFA) 110 MCG/ACT inhaler Inhale 1 puff into the lungs 2 (two) times daily.  01/12/16 10/18/16 Yes Historical Provider, MD  gabapentin (NEURONTIN) 300 MG capsule Take 300 mg by mouth 2 (two) times daily.   Yes Historical Provider, MD  guaiFENesin (MUCINEX) 600 MG 12 hr tablet Take 600 mg by mouth 2 (two) times daily as needed.   Yes Historical Provider, MD  hydrocortisone (PROCTOSOL HC) 2.5 % rectal cream PLACE RECTALLY TWICE A DAY 10/13/15  Yes Historical Provider, MD  latanoprost (XALATAN) 0.005 % ophthalmic solution Apply 1 drop to eye at bedtime.  01/17/16 01/16/17 Yes Historical Provider, MD  loratadine (CLARITIN) 10 MG tablet Take 10 mg by mouth daily as needed for allergies.   Yes Historical Provider, MD  losartan (COZAAR) 50 MG tablet Take 50 mg by mouth daily.   Yes Historical Provider, MD  Multiple Vitamin (MULTIVITAMIN) tablet Take 1 tablet by mouth daily.   Yes Historical Provider, MD  Multiple Vitamins-Minerals (OCUVITE  PRESERVISION PO) Take 1 tablet by mouth 2 (two) times daily.   Yes Historical Provider, MD  PARoxetine (PAXIL) 40 MG tablet Take 40 mg by mouth every morning.   Yes Historical Provider, MD  Polyethyl Glycol-Propyl Glycol (SYSTANE) 0.4-0.3 % SOLN Apply 1 drop to eye as needed.   Yes Historical Provider, MD  potassium chloride (K-DUR,KLOR-CON) 10 MEQ tablet Take 10 mEq by mouth 2 (two) times daily.   Yes Historical Provider,  MD  RABEprazole (ACIPHEX) 20 MG tablet Take 20 mg by mouth 2 (two) times daily.   Yes Historical Provider, MD  saccharomyces boulardii (FLORASTOR) 250 MG capsule Take 250 mg by mouth daily.   Yes Historical Provider, MD  Tetrahydroz-Dextran-PEG-Povid (EYE DROPS ADVANCED RELIEF OP) Apply to eye.   Yes Historical Provider, MD  timolol (TIMOPTIC-XR) 0.5 % ophthalmic gel-forming Place 1 drop into both eyes daily.   Yes Historical Provider, MD  valACYclovir (VALTREX) 1000 MG tablet once daily as needed. Reported on 03/22/2015 01/18/13  Yes Historical Provider, MD  sucralfate (CARAFATE) 1 g tablet Take 1 tablet (1 g total) by mouth 4 (four) times daily -  with meals and at bedtime. Patient not taking: Reported on 05/09/2016 08/13/15   Dustin Flock, MD    Allergies as of 05/09/2016 - Review Complete 05/09/2016  Allergen Reaction Noted  . Codeine Nausea And Vomiting 08/10/2015  . Dextrans Hives 08/10/2015  . Fentanyl Itching 08/10/2015  . Lipitor [atorvastatin] Other (See Comments) 08/10/2015  . Mobic [meloxicam] Other (See Comments) 08/10/2015  . Niacin and related Itching 08/10/2015  . Nsaids Hives 08/10/2015  . Pravachol [pravastatin sodium] Other (See Comments) 08/10/2015  . Statins  03/31/2016  . Voltaren [diclofenac sodium] Other (See Comments) 08/10/2015  . Zetia [ezetimibe] Other (See Comments) 08/10/2015    Family History  Problem Relation Age of Onset  . CAD Mother   . CAD Father   . CAD Brother     Social History   Social History  . Marital status: Widowed    Spouse name: N/A  . Number of children: N/A  . Years of education: N/A   Occupational History  . Not on file.   Social History Main Topics  . Smoking status: Former Smoker    Types: Cigarettes  . Smokeless tobacco: Never Used  . Alcohol use No  . Drug use: No  . Sexual activity: Not on file   Other Topics Concern  . Not on file   Social History Narrative  . No narrative on file    Review of Systems: See  HPI, otherwise negative ROS  Physical Exam: BP (!) 141/63 (BP Location: Right Arm)   Pulse 78   Temp 98.2 F (36.8 C) (Oral)   Resp 19   Ht 5\' 9"  (1.753 m)   Wt 180 lb (81.6 kg)   SpO2 96%   BMI 26.58 kg/m  General:   Alert,  pleasant and cooperative in NAD Head:  Normocephalic and atraumatic. Neck:  Supple; no masses or thyromegaly. Lungs:  Clear throughout to auscultation.    Heart:  Regular rate and rhythm. Abdomen:  Soft, nontender and nondistended. Normal bowel sounds, without guarding, and without rebound.   Neurologic:  Alert and  oriented x4;  grossly normal neurologically.  Impression/Plan: [name] [name] is here for an push  to be performed for push enteroscopy for GI bleed   Risks, benefits, limitations, and alternatives regarding  Push enteroscopy  have been reviewed with the patient.  Questions have been answered.  All parties agreeable.   [name], MD  05/10/2016, 11:20 AM

## 2016-05-10 NOTE — Transfer of Care (Signed)
Immediate Anesthesia Transfer of Care Note  Patient: [name] [name]  Procedure(s) Performed: Procedure(s): Push ENTEROSCOPY with pediatric colonoscope (N/A)  Patient Location: Endoscopy Unit  Anesthesia Type:General  Level of Consciousness: awake, alert , oriented and patient cooperative  Airway & Oxygen Therapy: Patient Spontanous Breathing and Patient connected to nasal cannula oxygen  Post-op Assessment: Report given to RN, Post -op Vital signs reviewed and stable and Patient moving all extremities X 4  Post vital signs: Reviewed and stable  Last Vitals:  Vitals:   05/10/16 0548 05/10/16 1107  BP: (!) 141/63   Pulse: 73 78  Resp: 19 19  Temp: 36.7 C 36.8 C    Last Pain:  Vitals:   05/10/16 1107  TempSrc: Oral  PainSc:       Patients Stated Pain Goal: 0 (76/19/50 9326)  Complications: No apparent anesthesia complications

## 2016-05-10 NOTE — Anesthesia Post-op Follow-up Note (Cosign Needed)
Anesthesia QCDR form completed.        

## 2016-05-10 NOTE — Evaluation (Signed)
Physical Therapy Evaluation Patient Details Name: [name] [name] MRN: [id number] DOB: Oct 13, 1925 Today's Date: 05/10/2016   History of Present Illness  Patient admitted with rectal bleeding and tarry stools. She underwent small bowel enteroscopy earlier this date.   Clinical Impression  Patient is an 83 y/o female that presents with GI bleed, had procedure done earlier this date with no complications. Her BP was WNL upon inspection, she has no deficits with bed mobility and sit to stand transfers. She typically uses a cane, but used IV pole with both hands today. She is able to ambulate around RN station with no loss of balance at what appears to be her usual gait speed. She appears to be at her physical baseline, and is appropriate for discharge home when medically appropriate.     Follow Up Recommendations No PT follow up    Equipment Recommendations       Recommendations for Other Services       Precautions / Restrictions Precautions Precautions: Fall Restrictions Weight Bearing Restrictions: No      Mobility  Bed Mobility Overal bed mobility: Independent             General bed mobility comments: No deficits identified with bed mobility.   Transfers Overall transfer level: Independent               General transfer comment: Patient is able to perform sit to stand quickly, no deficits identified.   Ambulation/Gait Ambulation/Gait assistance: Modified independent (Device/Increase time) Ambulation Distance (Feet): 200 Feet   Gait Pattern/deviations: WFL(Within Functional Limits)   Gait velocity interpretation: at or above normal speed for age/gender General Gait Details: Patient maintains ER at her feet. She has no lossof balance or shortness of breath during ambulation.   Stairs            Wheelchair Mobility    Modified Rankin (Stroke Patients Only)       Balance Overall balance assessment: Modified Independent                                            Pertinent Vitals/Pain Pain Assessment: No/denies pain    Home Living Family/patient expects to be discharged to:: Assisted living Living Arrangements: Alone             Home Equipment: [name] - single point      Prior Function Level of Independence: Independent with assistive device(s)         Comments: no longer drives-has [facility] as well as aide that can transport. No recent falls reported     Hand Dominance        Extremity/Trunk Assessment   Upper Extremity Assessment Upper Extremity Assessment: Overall WFL for tasks assessed    Lower Extremity Assessment Lower Extremity Assessment: Overall WFL for tasks assessed       Communication   Communication: No difficulties;HOH  Cognition Arousal/Alertness: Awake/alert Behavior During Therapy: WFL for tasks assessed/performed Overall Cognitive Status: Within Functional Limits for tasks assessed                                        General Comments      Exercises     Assessment/Plan    PT Assessment Patient needs continued PT services  PT Problem  List Decreased mobility;Decreased balance;Decreased activity tolerance       PT Treatment Interventions DME instruction;Therapeutic activities;Therapeutic exercise;Gait training;Stair training;Balance training;Neuromuscular re-education    PT Goals (Current goals can be found in the Care Plan section)  Acute Rehab PT Goals Patient Stated Goal: To return home  PT Goal Formulation: With patient Time For Goal Achievement: 05/24/16 Potential to Achieve Goals: Good    Frequency Min 2X/week   Barriers to discharge        Co-evaluation               AM-PAC PT "6 Clicks" Daily Activity  Outcome Measure Difficulty turning over in bed (including adjusting bedclothes, sheets and blankets)?: None Difficulty moving from lying on back to sitting on the side of the bed? :  None Difficulty sitting down on and standing up from a chair with arms (e.g., wheelchair, bedside commode, etc,.)?: None Help needed moving to and from a bed to chair (including a wheelchair)?: None Help needed walking in hospital room?: None Help needed climbing 3-5 steps with a railing? : A Little 6 Click Score: 23    End of Session Equipment Utilized During Treatment: Gait belt Activity Tolerance: Patient tolerated treatment well Patient left: in bed;with bed alarm set;with call bell/phone within reach Nurse Communication: Mobility status PT Visit Diagnosis: Unsteadiness on feet (R26.81)    Time: 1530-1550 PT Time Calculation (min) (ACUTE ONLY): 20 min   Charges:   PT Evaluation $PT Eval Low Complexity: 1 Procedure     PT G Codes:   PT G-Codes **NOT FOR INPATIENT CLASS** Functional Assessment Tool Used: AM-PAC 6 Clicks Basic Mobility Functional Limitation: Mobility: Walking and moving around Mobility: Walking and Moving Around Current Status (O0321): At least 1 percent but less than 20 percent impaired, limited or restricted Mobility: Walking and Moving Around Goal Status [phone]): At least 1 percent but less than 20 percent impaired, limited or restricted   Royce Macadamia PT, DPT, CSCS    05/10/2016, 4:16 PM

## 2016-05-11 ENCOUNTER — Encounter: Payer: Self-pay | Admitting: Gastroenterology

## 2016-05-11 ENCOUNTER — Encounter
Admission: EM | Disposition: A | Discharge status: Home / Self Care | Payer: Self-pay | Source: Home / Self Care | Attending: Internal Medicine

## 2016-05-11 LAB — HEMOGLOBIN: Hemoglobin: 6.4 g/dL — ABNORMAL LOW (ref 12.0–16.0)

## 2016-05-11 LAB — PREPARE RBC (CROSSMATCH)

## 2016-05-11 LAB — MAGNESIUM: Magnesium: 2 mg/dL (ref 1.7–2.4)

## 2016-05-11 LAB — POTASSIUM: Potassium: 3.3 mmol/L — ABNORMAL LOW (ref 3.5–5.1)

## 2016-05-11 LAB — HEMOGLOBIN AND HEMATOCRIT, BLOOD
HCT: 24.5 % — ABNORMAL LOW (ref 35.0–47.0)
Hemoglobin: 7.9 g/dL — ABNORMAL LOW (ref 12.0–16.0)

## 2016-05-11 SURGERY — ESOPHAGOGASTRODUODENOSCOPY (EGD) WITH PROPOFOL
Anesthesia: General

## 2016-05-11 MED ORDER — POTASSIUM CHLORIDE CRYS ER 10 MEQ PO TBCR
10.0000 meq | EXTENDED_RELEASE_TABLET | Freq: Two times a day (BID) | ORAL | Status: DC
  Administered 2016-05-12: 10 meq via ORAL
  Filled 2016-05-11: qty 1

## 2016-05-11 MED ORDER — SODIUM CHLORIDE 0.9 % IV SOLN
Freq: Once | INTRAVENOUS | Status: AC
  Administered 2016-05-11: 12:00:00 via INTRAVENOUS

## 2016-05-11 MED ORDER — POTASSIUM CHLORIDE CRYS ER 20 MEQ PO TBCR
40.0000 meq | EXTENDED_RELEASE_TABLET | ORAL | Status: AC
  Administered 2016-05-11 (×2): 40 meq via ORAL
  Filled 2016-05-11 (×2): qty 2

## 2016-05-11 NOTE — Progress Notes (Signed)
PT Cancellation Note  Patient Details Name: [name] [name] MRN: [id number] DOB: 12-09-1925   Cancelled Treatment:    Reason Eval/Treat Not Completed: Medical issues which prohibited therapy   Chart reviewed.  HGB noted to be 6.4.  Will hold at this time per therapy protocols and continue as appropriate.   Chesley Noon 05/11/2016, 8:18 AM

## 2016-05-11 NOTE — Plan of Care (Signed)
Problem: Bowel/Gastric: Goal: Will show no signs and symptoms of gastrointestinal bleeding Outcome: Progressing Patient has not had bloody stools since admission

## 2016-05-11 NOTE — Addendum Note (Signed)
Addendum  created 05/11/16 0845 by Silvana Newness, CRNA   Charge Capture section accepted

## 2016-05-11 NOTE — Progress Notes (Signed)
[facility] [place] NAME: [name] [name]    MR#:  [id number]  DATE OF BIRTH:  07-08-1925  SUBJECTIVE:   Patient came in with black tarry stools multiple episodes. She is status post enteroscopy.  Mild epigastric pain. Has not had a bowel movement in 2 days.  Worsening hemoglobin. REVIEW OF SYSTEMS:   Review of Systems  Constitutional: Negative for chills, fever and weight loss.  HENT: Negative for ear discharge, ear pain and nosebleeds.   Eyes: Negative for blurred vision, pain and discharge.  Respiratory: Negative for sputum production, shortness of breath, wheezing and stridor.   Cardiovascular: Negative for chest pain, palpitations, orthopnea and PND.  Gastrointestinal: Positive for melena. Negative for abdominal pain, diarrhea, nausea and vomiting.  Genitourinary: Negative for frequency and urgency.  Musculoskeletal: Negative for back pain and joint pain.  Neurological: Positive for weakness. Negative for sensory change, speech change and focal weakness.  Psychiatric/Behavioral: Negative for depression and hallucinations. The patient is not nervous/anxious.    Tolerating Diet:yes  DRUG ALLERGIES:   Allergies  Allergen Reactions  . Codeine Nausea And Vomiting  . Dextrans Hives  . Fentanyl Itching  . Lipitor [Atorvastatin] Other (See Comments)    Reaction: Muscle pain  . Mobic [Meloxicam] Other (See Comments)    Reaction: Mouth and tongue ulcers  . Niacin And Related Itching  . Nsaids Hives  . Pravachol [Pravastatin Sodium] Other (See Comments)    Reaction: Muscle pain  . Statins   . Voltaren [Diclofenac Sodium] Other (See Comments)    Reaction: Mouth and tongue ulcers  . Zetia [Ezetimibe] Other (See Comments)    Reaction: Leg pain    VITALS:  Blood pressure (!) 141/51, pulse 80, temperature 98.2 F (36.8 C), temperature source Oral, resp. rate 17, height 5\' 9"  (1.753 m), weight 81.6 kg (180 lb), SpO2 97  %.  PHYSICAL EXAMINATION:   Physical Exam  GENERAL:  83 y.o.-year-old patient lying in the bed with no acute distress. weak EYES: Pupils equal, round, reactive to light and accommodation. No scleral icterus. Extraocular muscles intact.  HEENT: Head atraumatic, normocephalic. Oropharynx and nasopharynx clear. Pallor++ NECK:  Supple, no jugular venous distention. No thyroid enlargement, no tenderness.  LUNGS: Normal breath sounds bilaterally, no wheezing, rales, rhonchi. No use of accessory muscles of respiration.  CARDIOVASCULAR: S1, S2 normal. No murmurs, rubs, or gallops.  ABDOMEN: Soft, nontender, nondistended. Bowel sounds present. No organomegaly or mass.  EXTREMITIES: No cyanosis, clubbing or edema b/l.    NEUROLOGIC: Cranial nerves II through XII are intact. No focal Motor or sensory deficits b/l.   PSYCHIATRIC:  patient is alert and oriented x 3.  SKIN: No obvious rash, lesion, or ulcer.   LABORATORY PANEL:  CBC  Recent Labs Lab 05/10/16 0058  05/11/16 0510  WBC 7.6  --   --   HGB 7.2*  < > 6.4*  HCT 22.4*  --   --   PLT 326  --   --   < > = values in this interval not displayed.  Chemistries   Recent Labs Lab 05/09/16 1300 05/10/16 0058 05/11/16 0510  NA 139 138  --   K 3.4* 3.0* 3.3*  CL 103 104  --   CO2 27 28  --   GLUCOSE 107* 96  --   BUN 38* 37*  --   CREATININE 0.61 0.61  --   CALCIUM 8.8* 8.4*  --   MG  --   --  2.0  AST 26  --   --   ALT 16  --   --   ALKPHOS 56  --   --   BILITOT 0.4  --   --    Cardiac Enzymes No results for input(s): TROPONINI in the last 168 hours. RADIOLOGY:  No results found. ASSESSMENT AND PLAN:   [name] [name]  is a 83 y.o. female with a known history of GI bleeding with anemia, COPD, hypertension and GERD. The patient to present to the ED with above chief complaints. The patient has had rectal bleeding several times over the past year. She had colonoscopy and EGD which was unremarkable per patient. She also got  capsule study recently  * Acute blood loss anemia Worsening today. Hb down to 6.3 Will transfuse 1 unit PRBC NOW. May be bleeding still Gi on board.  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 Management plans discussed with the patient, family and they are in agreement.  CODE STATUS: Full  DVT Prophylaxis: SCD  POSSIBLE D/C IN 1-2 DAYS, DEPENDING ON CLINICAL CONDITION.  Note: This dictation was prepared with Dragon dictation along with smaller phrase technology. Any transcriptional errors that result from this process are unintentional.  Hillary Bow R M.D on 05/11/2016 at 12:03 PM  Between 7am to 6pm - Pager - [phone]  After 6pm go to [email or web address] - password [email or web address] [facility]  Office  [phone]  CC: Primary care physician; Arrie Aran PATRICIA, DO

## 2016-05-12 LAB — BPAM RBC
Blood Product Expiration Date: 201805112359
ISSUE DATE / TIME: 201805031224
UNIT TYPE AND RH: 9500

## 2016-05-12 LAB — TYPE AND SCREEN
ABO/RH(D): O NEG
Antibody Screen: NEGATIVE
UNIT DIVISION: 0

## 2016-05-12 LAB — HEMOGLOBIN
Hemoglobin: 7.4 g/dL — ABNORMAL LOW (ref 12.0–16.0)
Hemoglobin: 7.5 g/dL — ABNORMAL LOW (ref 12.0–16.0)

## 2016-05-12 NOTE — Progress Notes (Signed)
Pt d/c to home today.  Foley removed per order.  IV removed intact.  Rx's given to pt w/all questions and concerns addressed.  D/C paperwork reviewed and education provided with all questions and concerns addressed.  Pt partner at bedside for home transport.  Volunteer services contact for transportation from room to exit.

## 2016-05-12 NOTE — Discharge Instructions (Signed)
Resume diet and activity as before ° ° °

## 2016-05-16 NOTE — Discharge Summary (Signed)
[name] [name] at [place] NAME: [name] [name]    MR#:  [id number]  DATE OF BIRTH:  29-May-1925  DATE OF ADMISSION:  05/09/2016 ADMITTING PHYSICIAN: Demetrios Loll, MD  DATE OF DISCHARGE: 05/12/2016  3:45 PM  PRIMARY CARE PHYSICIAN: Gearldine Shown, DO   ADMISSION DIAGNOSIS:  Gastrointestinal hemorrhage, unspecified gastrointestinal hemorrhage type [K92.2]  DISCHARGE DIAGNOSIS:  Active Problems:   GIB (gastrointestinal bleeding)   Angiodysplasia of intestinal tract   SECONDARY DIAGNOSIS:   Past Medical History:  Diagnosis Date  . Anemia   . COPD (chronic obstructive pulmonary disease) ([place])   . Diverticulitis   . GERD (gastroesophageal reflux disease)   . Glaucoma   . HLD (hyperlipidemia)   . Hypertension   . Iron deficiency   . Rectal bleeding      ADMITTING HISTORY   Large amount of black stool today. HISTORY OF PRESENT ILLNESS:  [name] [name]  is a 83 y.o. female with a known history of GI bleeding with anemia, COPD, hypertension and GERD. The patient to present to the ED with above chief complaints. The patient has had rectal bleeding several times over the past year. She had colonoscopy and EGD which was unremarkable per patient. She also got capsule study recently, but she doesn't know the result. She complains of abdominal cramping, nausea, dizziness and weakness but denies any chest pain, palpitation. Hemoglobin decreased to 8.3. ED physician discussed with Dr. Vicente Males and requested admission.   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 No bleeding was found. She was started on a diet after this. Initially hemoglobin trended down likely from old bleeding. She received 1 unit packed RBC and hemoglobin continued to remain stable. Her stool has gotten lighter by day of discharge. Discussed with Dr. Vicente Males on the day of discharge.  Hemoglobin checked again in the afternoon.. Stable hemoglobin June was discharged home to follow-up with GI in 1 week.  CONSULTS OBTAINED:  Treatment Team:  [name], MD  DRUG ALLERGIES:   Allergies  Allergen Reactions  . Codeine Nausea And Vomiting  . Dextrans Hives  . Fentanyl Itching  . Lipitor [Atorvastatin] Other (See Comments)    Reaction: Muscle pain  . Mobic [Meloxicam] Other (See Comments)    Reaction: Mouth and tongue ulcers  . Niacin And Related Itching  . Nsaids Hives  . Pravachol [Pravastatin Sodium] Other (See Comments)    Reaction: Muscle pain  . Statins   . Voltaren [Diclofenac Sodium] Other (See Comments)    Reaction: Mouth and tongue ulcers  . Zetia [Ezetimibe] Other (See Comments)    Reaction: Leg pain    DISCHARGE MEDICATIONS:   Discharge Medication List as of 05/12/2016  3:17 PM    CONTINUE these medications which have NOT CHANGED   Details  acetaminophen (TYLENOL) 650 MG CR tablet Take 650 mg by mouth every 8 (eight) hours as needed for pain., Historical Med    albuterol (PROVENTIL HFA;VENTOLIN HFA) 108 (90 Base) MCG/ACT inhaler Inhale 2 puffs into the lungs every 4 (four) hours as needed for wheezing or shortness of breath., Historical Med    chlorthalidone (HYGROTON) 25 MG tablet Take 12.5 mg by mouth daily. , Starting Tue 01/18/2016, Historical Med    COENZYME Q10 PO Take 1 tablet by mouth daily., Historical Med    colchicine 0.6 MG tablet Take 0.6 mg by mouth daily. , Starting  Thu 08/28/2013, Historical Med    Cyanocobalamin (B-12) 1000 MCG CAPS Take 2,000 Units by mouth 2 (two) times daily., Historical Med    cycloSPORINE (RESTASIS) 0.05 % ophthalmic emulsion Place 1 drop into both eyes 2 (two) times daily., Historical Med    fluticasone (FLOVENT HFA) 110 MCG/ACT inhaler Inhale 1 puff into the lungs 2 (two) times daily. , Starting Wed 01/12/2016, Until Wed 10/18/2016, Historical Med    gabapentin (NEURONTIN) 300 MG capsule Take 300 mg by mouth  2 (two) times daily., Historical Med    guaiFENesin (MUCINEX) 600 MG 12 hr tablet Take 600 mg by mouth 2 (two) times daily as needed., Historical Med    hydrocortisone (PROCTOSOL HC) 2.5 % rectal cream PLACE RECTALLY TWICE A DAY, Historical Med    latanoprost (XALATAN) 0.005 % ophthalmic solution Apply 1 drop to eye at bedtime. , Starting Mon 01/17/2016, Until Tue 01/16/2017, Historical Med    loratadine (CLARITIN) 10 MG tablet Take 10 mg by mouth daily as needed for allergies., Historical Med    losartan (COZAAR) 50 MG tablet Take 50 mg by mouth daily., Historical Med    Multiple Vitamin (MULTIVITAMIN) tablet Take 1 tablet by mouth daily., Historical Med    Multiple Vitamins-Minerals (OCUVITE PRESERVISION PO) Take 1 tablet by mouth 2 (two) times daily., Historical Med    PARoxetine (PAXIL) 40 MG tablet Take 40 mg by mouth every morning., Historical Med    Polyethyl Glycol-Propyl Glycol (SYSTANE) 0.4-0.3 % SOLN Apply 1 drop to eye as needed., Historical Med    potassium chloride (K-DUR,KLOR-CON) 10 MEQ tablet Take 10 mEq by mouth 2 (two) times daily., Historical Med    RABEprazole (ACIPHEX) 20 MG tablet Take 20 mg by mouth 2 (two) times daily., Historical Med    saccharomyces boulardii (FLORASTOR) 250 MG capsule Take 250 mg by mouth daily., Historical Med    Tetrahydroz-Dextran-PEG-Povid (EYE DROPS ADVANCED RELIEF OP) Apply to eye., Historical Med    timolol (TIMOPTIC-XR) 0.5 % ophthalmic gel-forming Place 1 drop into both eyes daily., Historical Med    valACYclovir (VALTREX) 1000 MG tablet once daily as needed. Reported on 03/22/2015, Historical Med    sucralfate (CARAFATE) 1 g tablet Take 1 tablet (1 g total) by mouth 4 (four) times daily -  with meals and at bedtime., Starting Fri 08/13/2015, Normal        Today   VITAL SIGNS:  Blood pressure (!) 157/47, pulse 88, temperature 98.1 F (36.7 C), temperature source Oral, resp. rate 16, height 5\' 9"  (1.753 m), weight 81.6 kg (180  lb), SpO2 99 %.  I/O:  No intake or output data in the 24 hours ending 05/16/16 1451  PHYSICAL EXAMINATION:  Physical Exam  GENERAL:  83 y.o.-year-old patient lying in the bed with no acute distress.  LUNGS: Normal breath sounds bilaterally, no wheezing, rales,rhonchi or crepitation. No use of accessory muscles of respiration.  CARDIOVASCULAR: S1, S2 normal. No murmurs, rubs, or gallops.  ABDOMEN: Soft, non-tender, non-distended. Bowel sounds present. No organomegaly or mass.  NEUROLOGIC: Moves all 4 extremities. PSYCHIATRIC: The patient is alert and oriented x 3.  SKIN: No obvious rash, lesion, or ulcer.   DATA REVIEW:   CBC  Recent Labs Lab 05/10/16 0058  05/11/16 1624  05/12/16 1322  WBC 7.6  --   --   --   --   HGB 7.2*  < > 7.9*  < > 7.5*  HCT 22.4*  --  24.5*  --   --  PLT 326  --   --   --   --   < > = values in this interval not displayed.  Chemistries   Recent Labs Lab 05/10/16 0058 05/11/16 0510  NA 138  --   K 3.0* 3.3*  CL 104  --   CO2 28  --   GLUCOSE 96  --   BUN 37*  --   CREATININE 0.61  --   CALCIUM 8.4*  --   MG  --  2.0    Cardiac Enzymes No results for input(s): TROPONINI in the last 168 hours.  Microbiology Results  Results for orders placed or performed during the hospital encounter of 03/31/16  MRSA PCR Screening     Status: None   Collection Time: 03/31/16 10:05 PM  Result Value Ref Range Status   MRSA by PCR NEGATIVE NEGATIVE Final    Comment:        The GeneXpert MRSA Assay (FDA approved for NASAL specimens only), is one component of a comprehensive MRSA colonization surveillance program. It is not intended to diagnose MRSA infection nor to guide or monitor treatment for MRSA infections.     RADIOLOGY:  No results found.  Follow up with PCP in 1 week.  Management plans discussed with the patient, family and they are in agreement.  CODE STATUS:  Code Status History    Date Active Date Inactive Code Status Order  ID Comments User Context   05/09/2016  4:15 PM 05/12/2016  7:04 PM Full Code [id number]  Demetrios Loll, MD Inpatient   04/12/2016  5:16 PM 04/15/2016  4:10 PM Full Code [id number]  Bettey Costa, MD Inpatient   03/31/2016 10:57 PM 04/04/2016  5:41 PM Partial Code [id number]  Harrie Foreman, MD Inpatient   03/31/2016  7:25 PM 03/31/2016 10:57 PM Full Code [id number]  Loletha Grayer, MD ED   10/10/2015 10:32 PM 10/13/2015  9:47 PM Full Code [id number]  Harvie Bridge, DO ED   08/11/2015  1:41 AM 08/13/2015  6:58 PM Full Code [id number]  Lance Coon, MD Inpatient      TOTAL TIME TAKING CARE OF THIS PATIENT ON DAY OF DISCHARGE: more than 30 minutes.   Hillary Bow R M.D on 05/16/2016 at 2:51 PM  Between 7am to 6pm - Pager - [phone]  After 6pm go to [email or web address] - password [email or web address] [place]  Office  [phone]  CC: Primary care physician; Gearldine Shown, DO  Note: This dictation was prepared with Dragon dictation along with smaller phrase technology. Any transcriptional errors that result from this process are unintentional.

## 2016-05-23 ENCOUNTER — Inpatient Hospital Stay (HOSPITAL_BASED_OUTPATIENT_CLINIC_OR_DEPARTMENT_OTHER): Payer: Medicare Other | Admitting: Oncology

## 2016-05-23 ENCOUNTER — Inpatient Hospital Stay: Payer: Medicare Other | Attending: Oncology

## 2016-05-23 ENCOUNTER — Inpatient Hospital Stay: Payer: Medicare Other

## 2016-05-23 VITALS — BP 127/68 | HR 89 | Resp 18

## 2016-05-23 VITALS — BP 110/65 | HR 99 | Temp 99.0°F | Resp 18 | Wt 184.0 lb

## 2016-05-23 DIAGNOSIS — K922 Gastrointestinal hemorrhage, unspecified: Secondary | ICD-10-CM | POA: Diagnosis not present

## 2016-05-23 DIAGNOSIS — K219 Gastro-esophageal reflux disease without esophagitis: Secondary | ICD-10-CM

## 2016-05-23 DIAGNOSIS — K921 Melena: Secondary | ICD-10-CM

## 2016-05-23 DIAGNOSIS — Z87891 Personal history of nicotine dependence: Secondary | ICD-10-CM | POA: Diagnosis not present

## 2016-05-23 DIAGNOSIS — J449 Chronic obstructive pulmonary disease, unspecified: Secondary | ICD-10-CM

## 2016-05-23 DIAGNOSIS — Z79899 Other long term (current) drug therapy: Secondary | ICD-10-CM | POA: Insufficient documentation

## 2016-05-23 DIAGNOSIS — Z8719 Personal history of other diseases of the digestive system: Secondary | ICD-10-CM | POA: Diagnosis not present

## 2016-05-23 DIAGNOSIS — D500 Iron deficiency anemia secondary to blood loss (chronic): Secondary | ICD-10-CM

## 2016-05-23 DIAGNOSIS — E785 Hyperlipidemia, unspecified: Secondary | ICD-10-CM | POA: Diagnosis not present

## 2016-05-23 DIAGNOSIS — I10 Essential (primary) hypertension: Secondary | ICD-10-CM | POA: Insufficient documentation

## 2016-05-23 LAB — CBC WITH DIFFERENTIAL/PLATELET
BASOS ABS: 0.2 10*3/uL — AB (ref 0–0.1)
BASOS PCT: 3 %
EOS ABS: 0.3 10*3/uL (ref 0–0.7)
Eosinophils Relative: 4 %
HEMATOCRIT: 19.8 % — AB (ref 35.0–47.0)
HEMOGLOBIN: 6.5 g/dL — AB (ref 12.0–16.0)
Lymphocytes Relative: 13 %
Lymphs Abs: 1 10*3/uL (ref 1.0–3.6)
MCH: 25 pg — ABNORMAL LOW (ref 26.0–34.0)
MCHC: 32.7 g/dL (ref 32.0–36.0)
MCV: 76.6 fL — ABNORMAL LOW (ref 80.0–100.0)
Monocytes Absolute: 1 10*3/uL — ABNORMAL HIGH (ref 0.2–0.9)
Monocytes Relative: 13 %
NEUTROS ABS: 5.3 10*3/uL (ref 1.4–6.5)
NEUTROS PCT: 67 %
Platelets: 462 10*3/uL — ABNORMAL HIGH (ref 150–440)
RBC: 2.59 MIL/uL — ABNORMAL LOW (ref 3.80–5.20)
RDW: 19.9 % — AB (ref 11.5–14.5)
WBC: 7.8 10*3/uL (ref 3.6–11.0)

## 2016-05-23 LAB — IRON AND TIBC
IRON: 13 ug/dL — AB (ref 28–170)
Saturation Ratios: 3 % — ABNORMAL LOW (ref 10.4–31.8)
TIBC: 437 ug/dL (ref 250–450)
UIBC: 424 ug/dL

## 2016-05-23 LAB — FERRITIN: FERRITIN: 7 ng/mL — AB (ref 11–307)

## 2016-05-23 LAB — POTASSIUM: POTASSIUM: 3.5 mmol/L (ref 3.5–5.1)

## 2016-05-23 MED ORDER — SODIUM CHLORIDE 0.9 % IV SOLN
Freq: Once | INTRAVENOUS | Status: AC
  Administered 2016-05-23: 15:00:00 via INTRAVENOUS
  Filled 2016-05-23: qty 1000

## 2016-05-23 MED ORDER — ALTEPLASE 2 MG IJ SOLR: 2.0000 mg | Freq: Once | INTRAMUSCULAR | Status: DC | PRN

## 2016-05-23 MED ORDER — SODIUM CHLORIDE 0.9% FLUSH
10.0000 mL | INTRAVENOUS | Status: DC | PRN
  Filled 2016-05-23: qty 10

## 2016-05-23 MED ORDER — SODIUM CHLORIDE 0.9 % IV SOLN
510.0000 mg | Freq: Once | INTRAVENOUS | Status: AC
  Administered 2016-05-23: 510 mg via INTRAVENOUS
  Filled 2016-05-23: qty 17

## 2016-05-23 MED ORDER — HEPARIN SOD (PORK) LOCK FLUSH 100 UNIT/ML IV SOLN: 250.0000 [IU] | Freq: Once | INTRAVENOUS | Status: DC | PRN

## 2016-05-23 MED ORDER — SODIUM CHLORIDE 0.9% FLUSH
3.0000 mL | Freq: Once | INTRAVENOUS | Status: DC | PRN
  Filled 2016-05-23: qty 3

## 2016-05-23 MED ORDER — HEPARIN SOD (PORK) LOCK FLUSH 100 UNIT/ML IV SOLN: 500.0000 [IU] | Freq: Once | INTRAVENOUS | Status: DC | PRN

## 2016-05-23 NOTE — Progress Notes (Signed)
Hematology/Oncology Consult note [facility]  [phone] [phone]  Patient Care Team: Gearldine Shown, DO as PCP - General (Family Medicine)   Name of the patient: [name] [name]  [id number]  24-Jan-1925   Date of visit: 05/23/16  Diagnosis- iron deficiency anemia  Chief complaint/ Reason for visit- routine f/u  Heme/Onc history: Patient is a 83 year old female who was last seen by Dr. Sherrine Maples in October 2017. Patient has a history of chronic iron deficiency anemia over several years. She has had EGD and CT scans at [facility] which showed diverticulosis. She cannot undergo colonoscopy at this time and she tolerates oral iron poorly due to stomach cramps and diarrhea. She has received IV iron infusions at [facility] as well as you and has tolerated it well without any significant side effects. She does become anemic from time to time and has even required blood transfusions in the past. She received 4 doses of IV Venofer starting October 2017. At this point she is getting IV iron and blood transfusion on a palliative basis to keep her hemoglobin close to 10  Patient presented with melena in March 2018. Underwent EGD and colonoscopy which did not reveal any bleeding. Recent hb on 05/12/16 showed hb of 7.5. Capsule study showed active bleeding from single spot in proximal jejunum. Underwent push enteroscopy with argon laser therapy. No bleeding was found. Last dose of feraheme was on 03/30/16  Interval history- denies any bleeding in stool. Reports fatigue and heart palpitations    Review of systems- Review of Systems  Constitutional: Negative for chills, fever, malaise/fatigue and weight loss.  HENT: Negative for congestion, ear discharge and nosebleeds.   Eyes: Negative for blurred vision.  Respiratory: Negative for cough, hemoptysis, sputum production, shortness of breath and wheezing.   Cardiovascular:  Negative for chest pain, palpitations, orthopnea and claudication.  Gastrointestinal: Negative for abdominal pain, blood in stool, constipation, diarrhea, heartburn, melena, nausea and vomiting.  Genitourinary: Negative for dysuria, flank pain, frequency, hematuria and urgency.  Musculoskeletal: Negative for back pain, joint pain and myalgias.  Skin: Negative for rash.  Neurological: Negative for dizziness, tingling, focal weakness, seizures, weakness and headaches.  Endo/Heme/Allergies: Does not bruise/bleed easily.  Psychiatric/Behavioral: Negative for depression and suicidal ideas. The patient does not have insomnia.      Current treatment- feraheme  Allergies  Allergen Reactions  . Codeine Nausea And Vomiting  . Dextrans Hives  . Fentanyl Itching  . Lipitor [Atorvastatin] Other (See Comments)    Reaction: Muscle pain  . Mobic [Meloxicam] Other (See Comments)    Reaction: Mouth and tongue ulcers  . Niacin And Related Itching  . Nsaids Hives  . Pravachol [Pravastatin Sodium] Other (See Comments)    Reaction: Muscle pain  . Statins   . Voltaren [Diclofenac Sodium] Other (See Comments)    Reaction: Mouth and tongue ulcers  . Zetia [Ezetimibe] Other (See Comments)    Reaction: Leg pain     Past Medical History:  Diagnosis Date  . Anemia   . COPD (chronic obstructive pulmonary disease) ([place])   . Diverticulitis   . GERD (gastroesophageal reflux disease)   . Glaucoma   . HLD (hyperlipidemia)   . Hypertension   . Iron deficiency   . Rectal bleeding      Past Surgical History:  Procedure Laterality Date  . ABDOMINAL HYSTERECTOMY    . APPENDECTOMY    . COLONOSCOPY WITH PROPOFOL N/A 04/14/2016   Procedure: COLONOSCOPY WITH  PROPOFOL;  Surgeon: Lucilla Lame, MD;  Location: [facility] ENDOSCOPY;  Service: Endoscopy;  Laterality: N/A;  . ENTEROSCOPY N/A 05/10/2016   Procedure: Push ENTEROSCOPY with pediatric colonoscope;  Surgeon: [name], MD;  Location: [facility] ENDOSCOPY;  Service:  Endoscopy;  Laterality: N/A;  . ESOPHAGOGASTRODUODENOSCOPY (EGD) WITH PROPOFOL N/A 08/12/2015   Procedure: ESOPHAGOGASTRODUODENOSCOPY (EGD) WITH PROPOFOL;  Surgeon: Lollie Sails, MD;  Location: [facility] ENDOSCOPY;  Service: Endoscopy;  Laterality: N/A;  . ESOPHAGOGASTRODUODENOSCOPY (EGD) WITH PROPOFOL N/A 04/03/2016   Procedure: ESOPHAGOGASTRODUODENOSCOPY (EGD) WITH PROPOFOL;  Surgeon: Lucilla Lame, MD;  Location: [facility] ENDOSCOPY;  Service: Endoscopy;  Laterality: N/A;  . GIVENS CAPSULE STUDY N/A 04/28/2016   Procedure: GIVENS CAPSULE STUDY;  Surgeon: [name], MD;  Location: [facility] ENDOSCOPY;  Service: Endoscopy;  Laterality: N/A;  . JOINT REPLACEMENT      Social History   Social History  . Marital status: Widowed    Spouse name: N/A  . Number of children: N/A  . Years of education: N/A   Occupational History  . Not on file.   Social History Main Topics  . Smoking status: Former Smoker    Types: Cigarettes  . Smokeless tobacco: Never Used  . Alcohol use No  . Drug use: No  . Sexual activity: Not on file   Other Topics Concern  . Not on file   Social History Narrative  . No narrative on file    Family History  Problem Relation Age of Onset  . CAD Mother   . CAD Father   . CAD Brother      Current Outpatient Prescriptions:  .  acetaminophen (TYLENOL) 650 MG CR tablet, Take 650 mg by mouth every 8 (eight) hours as needed for pain., Disp: , Rfl:  .  albuterol (PROVENTIL HFA;VENTOLIN HFA) 108 (90 Base) MCG/ACT inhaler, Inhale 2 puffs into the lungs every 4 (four) hours as needed for wheezing or shortness of breath., Disp: , Rfl:  .  chlorthalidone (HYGROTON) 25 MG tablet, Take 12.5 mg by mouth daily. , Disp: , Rfl:  .  COENZYME Q10 PO, Take 1 tablet by mouth daily., Disp: , Rfl:  .  colchicine 0.6 MG tablet, Take 0.6 mg by mouth daily. , Disp: , Rfl:  .  Cyanocobalamin (B-12) 1000 MCG CAPS, Take 2,000 Units by mouth 2 (two) times daily., Disp: , Rfl:  .  cycloSPORINE  (RESTASIS) 0.05 % ophthalmic emulsion, Place 1 drop into both eyes 2 (two) times daily., Disp: , Rfl:  .  fluticasone (FLOVENT HFA) 110 MCG/ACT inhaler, Inhale 1 puff into the lungs 2 (two) times daily. , Disp: , Rfl:  .  gabapentin (NEURONTIN) 300 MG capsule, Take 300 mg by mouth 2 (two) times daily., Disp: , Rfl:  .  guaiFENesin (MUCINEX) 600 MG 12 hr tablet, Take 600 mg by mouth 2 (two) times daily as needed., Disp: , Rfl:  .  hydrocortisone (PROCTOSOL HC) 2.5 % rectal cream, PLACE RECTALLY TWICE A DAY, Disp: , Rfl:  .  latanoprost (XALATAN) 0.005 % ophthalmic solution, Apply 1 drop to eye at bedtime. , Disp: , Rfl:  .  loratadine (CLARITIN) 10 MG tablet, Take 10 mg by mouth daily as needed for allergies., Disp: , Rfl:  .  losartan (COZAAR) 50 MG tablet, Take 50 mg by mouth daily., Disp: , Rfl:  .  Multiple Vitamin (MULTIVITAMIN) tablet, Take 1 tablet by mouth daily., Disp: , Rfl:  .  Multiple Vitamins-Minerals (OCUVITE PRESERVISION PO), Take 1 tablet by mouth 2 (two)  times daily., Disp: , Rfl:  .  PARoxetine (PAXIL) 40 MG tablet, Take 40 mg by mouth every morning., Disp: , Rfl:  .  Polyethyl Glycol-Propyl Glycol (SYSTANE) 0.4-0.3 % SOLN, Apply 1 drop to eye as needed., Disp: , Rfl:  .  potassium chloride (K-DUR,KLOR-CON) 10 MEQ tablet, Take 10 mEq by mouth 2 (two) times daily., Disp: , Rfl:  .  RABEprazole (ACIPHEX) 20 MG tablet, Take 20 mg by mouth 2 (two) times daily., Disp: , Rfl:  .  saccharomyces boulardii (FLORASTOR) 250 MG capsule, Take 250 mg by mouth daily., Disp: , Rfl:  .  sucralfate (CARAFATE) 1 g tablet, Take 1 tablet (1 g total) by mouth 4 (four) times daily -  with meals and at bedtime. (Patient not taking: Reported on 05/09/2016), Disp: 120 tablet, Rfl: 1 .  Tetrahydroz-Dextran-PEG-Povid (EYE DROPS ADVANCED RELIEF OP), Apply to eye., Disp: , Rfl:  .  timolol (TIMOPTIC-XR) 0.5 % ophthalmic gel-forming, Place 1 drop into both eyes daily., Disp: , Rfl:  .  valACYclovir (VALTREX) 1000  MG tablet, once daily as needed. Reported on 03/22/2015, Disp: , Rfl:   Physical exam:  Vitals:   05/23/16 1353  BP: 110/65  Pulse: 99  Resp: 18  Temp: 99 F (37.2 C)  TempSrc: Tympanic  Weight: 184 lb (83.5 kg)   Physical Exam  Constitutional: She is oriented to person, place, and time and well-developed, well-nourished, and in no distress.  HENT:  Head: Normocephalic and atraumatic.  Eyes: EOM are normal. Pupils are equal, round, and reactive to light.  Neck: Normal range of motion.  Cardiovascular: Regular rhythm and normal heart sounds.   tachycardic  Pulmonary/Chest: Effort normal and breath sounds normal.  Abdominal: Soft. Bowel sounds are normal.  Neurological: She is alert and oriented to person, place, and time.  Skin: Skin is warm and dry.     CMP Latest Ref Rng & Units 05/11/2016  Glucose 65 - 99 mg/dL -  BUN 6 - 20 mg/dL -  Creatinine 0.44 - 1.00 mg/dL -  Sodium 135 - 145 mmol/L -  Potassium 3.5 - 5.1 mmol/L 3.3(L)  Chloride 101 - 111 mmol/L -  CO2 22 - 32 mmol/L -  Calcium 8.9 - 10.3 mg/dL -  Total Protein 6.5 - 8.1 g/dL -  Total Bilirubin 0.3 - 1.2 mg/dL -  Alkaline Phos 38 - 126 U/L -  AST 15 - 41 U/L -  ALT 14 - 54 U/L -   CBC Latest Ref Rng & Units 05/23/2016  WBC 3.6 - 11.0 K/uL 7.8  Hemoglobin 12.0 - 16.0 g/dL 6.5(L)  Hematocrit 35.0 - 47.0 % 19.8(L)  Platelets 150 - 440 K/uL 462(H)      Assessment and plan- Patient is a 82 y.o. female with severe iron deficiency anemia due to GI bleeding source unclear  Patient had recent EGD/ colonoscopy capsule study and push enteroscopy. Possible bleed in proximal jejunum which was dealt with argon laser. Her hb is 6.5 today. Denies any active bleed. She is seeing GI later this week. Will plan to give her 1 dose of feraheme this week and 1 dose next week. 1 unit of PRBC tomorrow. Repeat cbc in 1 month. Cbc and iron studies and see me in 2 months  Risks and benefits of blood transfusion discussed. patient  agrees to proceed.   We will update Dr. Vicente Males as well   Visit Diagnosis 1. Iron deficiency anemia due to chronic blood loss      Dr.  Randa Evens, MD, MPH [place] at [facility] Pager- [phone] 05/23/2016 2:21 PM

## 2016-05-23 NOTE — Progress Notes (Signed)
Here for follow up. D/c from hospital 1.5 w ago after rectal bleeding -black /malodorous,  Saw Dr Markus Jarvis on 5/14 told hgb on d/c from hospital was just above  7. Per pt. Feeling exhausted .

## 2016-05-23 NOTE — Addendum Note (Signed)
Addended by: Luella Cook on: 05/23/2016 02:55 PM   Modules accepted: Orders

## 2016-05-24 ENCOUNTER — Encounter: Payer: Self-pay | Admitting: Gastroenterology

## 2016-05-24 ENCOUNTER — Inpatient Hospital Stay: Payer: Medicare Other

## 2016-05-24 ENCOUNTER — Ambulatory Visit (INDEPENDENT_AMBULATORY_CARE_PROVIDER_SITE_OTHER): Payer: Medicare Other | Admitting: Gastroenterology

## 2016-05-24 ENCOUNTER — Other Ambulatory Visit: Payer: Self-pay | Admitting: *Deleted

## 2016-05-24 VITALS — BP 119/69 | HR 90 | Temp 98.1°F | Ht 69.0 in | Wt 186.8 lb

## 2016-05-24 DIAGNOSIS — D500 Iron deficiency anemia secondary to blood loss (chronic): Secondary | ICD-10-CM

## 2016-05-24 DIAGNOSIS — K5520 Angiodysplasia of colon without hemorrhage: Secondary | ICD-10-CM

## 2016-05-24 DIAGNOSIS — D509 Iron deficiency anemia, unspecified: Secondary | ICD-10-CM

## 2016-05-24 LAB — PREPARE RBC (CROSSMATCH)

## 2016-05-24 MED ORDER — ACETAMINOPHEN 325 MG PO TABS: 650.0000 mg | ORAL_TABLET | Freq: Once | ORAL | Status: DC

## 2016-05-24 MED ORDER — SODIUM CHLORIDE 0.9 % IV SOLN
250.0000 mL | Freq: Once | INTRAVENOUS | Status: AC
  Administered 2016-05-24: 250 mL via INTRAVENOUS
  Filled 2016-05-24: qty 250

## 2016-05-24 NOTE — Progress Notes (Signed)
Primary Care Physician: Gearldine Shown, DO  Primary Gastroenterologist:  Dr. [name]   Chief Complaint  Patient presents with  . GI Bleeding    FOLLOW UP    HPI: [name] [name] is a 83 y.o. female   Summary of history :  She is here to follow up . I was consulted to see her when she was admitted to the hospital with Melena, iron deficiency anemia . She had previously had an EGD which was normal except for a hiatal hernia , discharged in 4/18 with recurrence of black stools. She had a normal colonoscopy . A capsule study showed bleeding in the proximal jejunum. 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 Iron studies from yesterday show a ferritin of 7 .    Interval history   05/12/2016-  05/24/2016   Since discharge dropped 1 gram Hb. Didn't receive any IV iron in the hospital.   Seen by Hematology yesterday and received 1 unit blood and IV iron being planned.  CBC Latest Ref Rng & Units 05/23/2016 05/12/2016 05/12/2016  WBC 3.6 - 11.0 K/uL 7.8 - -  Hemoglobin 12.0 - 16.0 g/dL 6.5(L) 7.5(L) 7.4(L)  Hematocrit 35.0 - 47.0 % 19.8(L) - -  Platelets 150 - 440 K/uL 462(H) - -    No black stool Received 1 unit PRBC and IV iron .No blood in stool.  Current Outpatient Prescriptions  Medication Sig Dispense Refill  . acetaminophen (TYLENOL) 650 MG CR tablet Take 650 mg by mouth every 8 (eight) hours as needed for pain.    Marland Kitchen albuterol (PROVENTIL HFA;VENTOLIN HFA) 108 (90 Base) MCG/ACT inhaler Inhale 2 puffs into the lungs every 4 (four) hours as needed for wheezing or shortness of breath.    . chlorthalidone (HYGROTON) 25 MG tablet Take 12.5 mg by mouth daily.     Marland Kitchen COENZYME Q10 PO Take 1 tablet by mouth daily.    . colchicine 0.6 MG tablet Take 0.6 mg by mouth daily.     . Cyanocobalamin (B-12) 1000 MCG CAPS Take 2,000 Units by mouth 2 (two) times daily.    . cycloSPORINE (RESTASIS) 0.05 % ophthalmic emulsion Place 1 drop  into both eyes 2 (two) times daily.    . fluticasone (FLOVENT HFA) 110 MCG/ACT inhaler Inhale 1 puff into the lungs 2 (two) times daily.     Marland Kitchen gabapentin (NEURONTIN) 300 MG capsule Take 300 mg by mouth 2 (two) times daily.    Marland Kitchen guaiFENesin (MUCINEX) 600 MG 12 hr tablet Take 600 mg by mouth 2 (two) times daily as needed.    . hydrocortisone (PROCTOSOL HC) 2.5 % rectal cream PLACE RECTALLY TWICE A DAY    . latanoprost (XALATAN) 0.005 % ophthalmic solution Apply 1 drop to eye at bedtime.     Marland Kitchen losartan (COZAAR) 50 MG tablet Take 50 mg by mouth daily.    . mometasone (ELOCON) 0.1 % lotion Apply topically.    . Multiple Vitamin (MULTIVITAMIN) tablet Take 1 tablet by mouth daily.    . Multiple Vitamins-Minerals (OCUVITE PRESERVISION PO) Take 1 tablet by mouth 2 (two) times daily.    Marland Kitchen PARoxetine (PAXIL) 40 MG tablet Take 40 mg by mouth every morning.    Vladimir Faster Glycol-Propyl Glycol (SYSTANE) 0.4-0.3 % SOLN Apply 1 drop to eye as needed.    . potassium chloride (K-DUR,KLOR-CON) 10 MEQ tablet Take 10 mEq by mouth 3 (three) times daily.     Marland Kitchen  RABEprazole (ACIPHEX) 20 MG tablet Take 20 mg by mouth 2 (two) times daily.    Marland Kitchen saccharomyces boulardii (FLORASTOR) 250 MG capsule Take 250 mg by mouth daily.    . sucralfate (CARAFATE) 1 g tablet Take 1 tablet (1 g total) by mouth 4 (four) times daily -  with meals and at bedtime. 120 tablet 1  . Tetrahydroz-Dextran-PEG-Povid (EYE DROPS ADVANCED RELIEF OP) Apply to eye.    . timolol (TIMOPTIC-XR) 0.5 % ophthalmic gel-forming Place 1 drop into both eyes daily.    . valACYclovir (VALTREX) 1000 MG tablet once daily as needed. Reported on 03/22/2015    . loratadine (CLARITIN) 10 MG tablet Take 10 mg by mouth daily as needed for allergies.     No current facility-administered medications for this visit.     Allergies as of 05/24/2016 - Review Complete 05/24/2016  Allergen Reaction Noted  . Codeine Nausea And Vomiting 08/10/2015  . Dextrans Hives 08/10/2015  .  Fentanyl Itching 08/10/2015  . Lipitor [atorvastatin] Other (See Comments) 08/10/2015  . Mobic [meloxicam] Other (See Comments) 08/10/2015  . Niacin and related Itching 08/10/2015  . Nsaids Hives 08/10/2015  . Pravachol [pravastatin sodium] Other (See Comments) 08/10/2015  . Statins  03/31/2016  . Voltaren [diclofenac sodium] Other (See Comments) 08/10/2015  . Zetia [ezetimibe] Other (See Comments) 08/10/2015    ROS:  General: Negative for anorexia, weight loss, fever, chills, fatigue, weakness. ENT: Negative for hoarseness, difficulty swallowing , nasal congestion. CV: Negative for chest pain, angina, palpitations, dyspnea on exertion, peripheral edema.  Respiratory: Negative for dyspnea at rest, dyspnea on exertion, cough, sputum, wheezing.  GI: See history of present illness. GU:  Negative for dysuria, hematuria, urinary incontinence, urinary frequency, nocturnal urination.  Endo: Negative for unusual weight change.    Physical Examination:   BP 119/69   Pulse 90   Temp 98.1 F (36.7 C) (Oral)   Ht 5\' 9"  (1.753 m)   Wt 186 lb 12.8 oz (84.7 kg)   BMI 27.59 kg/m   General: Well-nourished, well-developed in no acute distress.  Eyes: No icterus. Conjunctivae pink. Mouth: Oropharyngeal mucosa moist and pink , no lesions erythema or exudate. Lungs: Clear to auscultation bilaterally. Non-labored. Heart: Regular rate and rhythm, no murmurs rubs or gallops.  Abdomen: Bowel sounds are normal, nontender, nondistended, no hepatosplenomegaly or masses, no abdominal bruits or hernia , no rebound or guarding.   Extremities: No lower extremity edema. No clubbing or deformities. Neuro: Alert and oriented x 3.  Grossly intact. Skin: Warm and dry, no jaundice.   Psych: Alert and cooperative, normal mood and affect.   Imaging Studies: No results found.  Assessment and Plan:   [name] [name] is a 83 y.o. 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    Plan  1. IV iron per hematology 2. CBC in 1 week if continues to drop would require repeat push enteroscopy and if negative single balloon enteroscopy at [facility] or [facility] 3. Continue PPI 4. No NSAID's    Dr [name]  MD Follow up in 4 weeks

## 2016-05-25 LAB — TYPE AND SCREEN
ABO/RH(D): O NEG
Antibody Screen: NEGATIVE
UNIT DIVISION: 0

## 2016-05-25 LAB — BPAM RBC
BLOOD PRODUCT EXPIRATION DATE: 201805292359
ISSUE DATE / TIME: 201805160947
UNIT TYPE AND RH: 9500

## 2016-05-30 ENCOUNTER — Inpatient Hospital Stay
Admission: EM | Admit: 2016-05-30 | Discharge: 2016-05-31 | DRG: 378 | Disposition: A | Discharge status: Home / Self Care | Payer: Medicare Other | Attending: Internal Medicine | Admitting: Internal Medicine

## 2016-05-30 ENCOUNTER — Encounter
Admission: EM | Disposition: A | Discharge status: Home / Self Care | Payer: Self-pay | Source: Home / Self Care | Attending: Internal Medicine

## 2016-05-30 ENCOUNTER — Inpatient Hospital Stay: Payer: Medicare Other | Admitting: Anesthesiology

## 2016-05-30 ENCOUNTER — Telehealth: Payer: Self-pay

## 2016-05-30 DIAGNOSIS — M1990 Unspecified osteoarthritis, unspecified site: Secondary | ICD-10-CM | POA: Diagnosis present

## 2016-05-30 DIAGNOSIS — J449 Chronic obstructive pulmonary disease, unspecified: Secondary | ICD-10-CM | POA: Diagnosis not present

## 2016-05-30 DIAGNOSIS — K922 Gastrointestinal hemorrhage, unspecified: Secondary | ICD-10-CM

## 2016-05-30 DIAGNOSIS — M797 Fibromyalgia: Secondary | ICD-10-CM | POA: Diagnosis present

## 2016-05-30 DIAGNOSIS — Z885 Allergy status to narcotic agent status: Secondary | ICD-10-CM

## 2016-05-30 DIAGNOSIS — Z79899 Other long term (current) drug therapy: Secondary | ICD-10-CM

## 2016-05-30 DIAGNOSIS — K921 Melena: Principal | ICD-10-CM | POA: Diagnosis present

## 2016-05-30 DIAGNOSIS — Z87891 Personal history of nicotine dependence: Secondary | ICD-10-CM

## 2016-05-30 DIAGNOSIS — D649 Anemia, unspecified: Secondary | ICD-10-CM

## 2016-05-30 DIAGNOSIS — Z7951 Long term (current) use of inhaled steroids: Secondary | ICD-10-CM

## 2016-05-30 DIAGNOSIS — F419 Anxiety disorder, unspecified: Secondary | ICD-10-CM | POA: Diagnosis not present

## 2016-05-30 DIAGNOSIS — K279 Peptic ulcer, site unspecified, unspecified as acute or chronic, without hemorrhage or perforation: Secondary | ICD-10-CM | POA: Diagnosis present

## 2016-05-30 DIAGNOSIS — I10 Essential (primary) hypertension: Secondary | ICD-10-CM | POA: Diagnosis present

## 2016-05-30 DIAGNOSIS — D62 Acute posthemorrhagic anemia: Secondary | ICD-10-CM | POA: Diagnosis present

## 2016-05-30 DIAGNOSIS — H409 Unspecified glaucoma: Secondary | ICD-10-CM | POA: Diagnosis present

## 2016-05-30 DIAGNOSIS — Z886 Allergy status to analgesic agent status: Secondary | ICD-10-CM

## 2016-05-30 DIAGNOSIS — M109 Gout, unspecified: Secondary | ICD-10-CM | POA: Diagnosis present

## 2016-05-30 DIAGNOSIS — G629 Polyneuropathy, unspecified: Secondary | ICD-10-CM | POA: Diagnosis not present

## 2016-05-30 DIAGNOSIS — K219 Gastro-esophageal reflux disease without esophagitis: Secondary | ICD-10-CM | POA: Diagnosis present

## 2016-05-30 DIAGNOSIS — Z888 Allergy status to other drugs, medicaments and biological substances status: Secondary | ICD-10-CM

## 2016-05-30 DIAGNOSIS — Z66 Do not resuscitate: Secondary | ICD-10-CM | POA: Diagnosis present

## 2016-05-30 DIAGNOSIS — H3530 Unspecified macular degeneration: Secondary | ICD-10-CM | POA: Diagnosis not present

## 2016-05-30 DIAGNOSIS — Z9660 Presence of unspecified orthopedic joint implant: Secondary | ICD-10-CM | POA: Diagnosis not present

## 2016-05-30 DIAGNOSIS — Z9089 Acquired absence of other organs: Secondary | ICD-10-CM

## 2016-05-30 HISTORY — PX: ENTEROSCOPY: SHX5533

## 2016-05-30 LAB — PREPARE RBC (CROSSMATCH)

## 2016-05-30 LAB — COMPREHENSIVE METABOLIC PANEL
ALK PHOS: 54 U/L (ref 38–126)
ALT: 15 U/L (ref 14–54)
AST: 27 U/L (ref 15–41)
Albumin: 3.4 g/dL — ABNORMAL LOW (ref 3.5–5.0)
Anion gap: 7 (ref 5–15)
BUN: 37 mg/dL — AB (ref 6–20)
CALCIUM: 8.5 mg/dL — AB (ref 8.9–10.3)
CO2: 26 mmol/L (ref 22–32)
CREATININE: 0.62 mg/dL (ref 0.44–1.00)
Chloride: 104 mmol/L (ref 101–111)
Glucose, Bld: 103 mg/dL — ABNORMAL HIGH (ref 65–99)
Potassium: 3.6 mmol/L (ref 3.5–5.1)
Sodium: 137 mmol/L (ref 135–145)
Total Bilirubin: 0.5 mg/dL (ref 0.3–1.2)
Total Protein: 6 g/dL — ABNORMAL LOW (ref 6.5–8.1)

## 2016-05-30 LAB — PROTIME-INR
INR: 1.1
Prothrombin Time: 14.2 seconds (ref 11.4–15.2)

## 2016-05-30 LAB — CBC
HCT: 20.8 % — ABNORMAL LOW (ref 35.0–47.0)
Hemoglobin: 6.7 g/dL — ABNORMAL LOW (ref 12.0–16.0)
MCH: 27.4 pg (ref 26.0–34.0)
MCHC: 32.5 g/dL (ref 32.0–36.0)
MCV: 84.4 fL (ref 80.0–100.0)
PLATELETS: 310 10*3/uL (ref 150–440)
RBC: 2.46 MIL/uL — AB (ref 3.80–5.20)
RDW: 27.1 % — AB (ref 11.5–14.5)
WBC: 7.5 10*3/uL (ref 3.6–11.0)

## 2016-05-30 SURGERY — ENTEROSCOPY
Anesthesia: General

## 2016-05-30 MED ORDER — PANTOPRAZOLE SODIUM 40 MG IV SOLR: 40.0000 mg | Freq: Two times a day (BID) | INTRAVENOUS | Status: DC

## 2016-05-30 MED ORDER — PAROXETINE HCL 20 MG PO TABS
40.0000 mg | ORAL_TABLET | ORAL | Status: DC
  Administered 2016-05-31: 40 mg via ORAL
  Filled 2016-05-30: qty 2

## 2016-05-30 MED ORDER — LOSARTAN POTASSIUM 50 MG PO TABS
50.0000 mg | ORAL_TABLET | Freq: Every day | ORAL | Status: DC
  Administered 2016-05-31: 50 mg via ORAL
  Filled 2016-05-30: qty 1

## 2016-05-30 MED ORDER — COLCHICINE 0.6 MG PO TABS
0.6000 mg | ORAL_TABLET | Freq: Every day | ORAL | Status: DC
  Filled 2016-05-30: qty 1

## 2016-05-30 MED ORDER — VALACYCLOVIR HCL 500 MG PO TABS
1000.0000 mg | ORAL_TABLET | Freq: Every day | ORAL | Status: DC | PRN
  Filled 2016-05-30: qty 2

## 2016-05-30 MED ORDER — PROPOFOL 500 MG/50ML IV EMUL
INTRAVENOUS | Status: AC
  Filled 2016-05-30: qty 50

## 2016-05-30 MED ORDER — ACETAMINOPHEN 325 MG PO TABS: 650.0000 mg | ORAL_TABLET | Freq: Four times a day (QID) | ORAL | Status: DC | PRN

## 2016-05-30 MED ORDER — SODIUM CHLORIDE 0.9 % IV SOLN
Freq: Once | INTRAVENOUS | Status: AC
  Administered 2016-05-30: 17:00:00 via INTRAVENOUS

## 2016-05-30 MED ORDER — POLYVINYL ALCOHOL 1.4 % OP SOLN
1.0000 [drp] | OPHTHALMIC | Status: DC | PRN
  Filled 2016-05-30: qty 15

## 2016-05-30 MED ORDER — FUROSEMIDE 10 MG/ML IJ SOLN: 40.0000 mg | Freq: Once | INTRAMUSCULAR | Status: DC

## 2016-05-30 MED ORDER — VITAMIN B-12 1000 MCG PO TABS
3000.0000 ug | ORAL_TABLET | Freq: Every day | ORAL | Status: DC
  Administered 2016-05-31: 3000 ug via ORAL
  Filled 2016-05-30: qty 3

## 2016-05-30 MED ORDER — PROPOFOL 500 MG/50ML IV EMUL
INTRAVENOUS | Status: DC | PRN
  Administered 2016-05-30: 120 ug/kg/min via INTRAVENOUS

## 2016-05-30 MED ORDER — LIDOCAINE HCL 2 % IJ SOLN
INTRAMUSCULAR | Status: AC
  Filled 2016-05-30: qty 10

## 2016-05-30 MED ORDER — HYDROCORTISONE 2.5 % RE CREA
TOPICAL_CREAM | Freq: Two times a day (BID) | RECTAL | Status: DC
  Administered 2016-05-30: 20:00:00 via RECTAL
  Filled 2016-05-30: qty 28.35

## 2016-05-30 MED ORDER — GABAPENTIN 300 MG PO CAPS
300.0000 mg | ORAL_CAPSULE | Freq: Two times a day (BID) | ORAL | Status: DC
  Administered 2016-05-30 – 2016-05-31 (×2): 300 mg via ORAL
  Filled 2016-05-30 (×2): qty 1

## 2016-05-30 MED ORDER — LIDOCAINE HCL (CARDIAC) 20 MG/ML IV SOLN
INTRAVENOUS | Status: DC | PRN
  Administered 2016-05-30: 100 mg via INTRAVENOUS

## 2016-05-30 MED ORDER — TIMOLOL MALEATE 0.5 % OP SOLG
1.0000 [drp] | Freq: Every day | OPHTHALMIC | Status: DC
  Filled 2016-05-30: qty 5

## 2016-05-30 MED ORDER — FLUTICASONE PROPIONATE HFA 110 MCG/ACT IN AERO: 1.0000 | INHALATION_SPRAY | Freq: Two times a day (BID) | RESPIRATORY_TRACT | Status: DC

## 2016-05-30 MED ORDER — ACETAMINOPHEN 325 MG PO TABS
650.0000 mg | ORAL_TABLET | Freq: Once | ORAL | Status: AC
  Administered 2016-05-30: 650 mg via ORAL
  Filled 2016-05-30: qty 2

## 2016-05-30 MED ORDER — ALBUTEROL SULFATE (2.5 MG/3ML) 0.083% IN NEBU: 3.0000 mL | INHALATION_SOLUTION | RESPIRATORY_TRACT | Status: DC | PRN

## 2016-05-30 MED ORDER — CHLORTHALIDONE 25 MG PO TABS
12.5000 mg | ORAL_TABLET | Freq: Every day | ORAL | Status: DC
  Administered 2016-05-31: 12.5 mg via ORAL
  Filled 2016-05-30: qty 1

## 2016-05-30 MED ORDER — ACETAMINOPHEN 650 MG RE SUPP: 650.0000 mg | Freq: Four times a day (QID) | RECTAL | Status: DC | PRN

## 2016-05-30 MED ORDER — SODIUM CHLORIDE 0.9 % IV SOLN
500.0000 mg | Freq: Once | INTRAVENOUS | Status: DC
  Filled 2016-05-30: qty 25

## 2016-05-30 MED ORDER — LATANOPROST 0.005 % OP SOLN
1.0000 [drp] | Freq: Every day | OPHTHALMIC | Status: DC
  Administered 2016-05-30: 1 [drp] via OPHTHALMIC
  Filled 2016-05-30: qty 2.5

## 2016-05-30 MED ORDER — FUROSEMIDE 10 MG/ML IJ SOLN
40.0000 mg | Freq: Once | INTRAMUSCULAR | Status: AC
  Administered 2016-05-30: 40 mg via INTRAVENOUS
  Filled 2016-05-30: qty 4

## 2016-05-30 MED ORDER — ONDANSETRON HCL 4 MG/2ML IJ SOLN: 4.0000 mg | Freq: Four times a day (QID) | INTRAMUSCULAR | Status: DC | PRN

## 2016-05-30 MED ORDER — ONDANSETRON HCL 4 MG PO TABS: 4.0000 mg | ORAL_TABLET | Freq: Four times a day (QID) | ORAL | Status: DC | PRN

## 2016-05-30 MED ORDER — PROPOFOL 10 MG/ML IV BOLUS
INTRAVENOUS | Status: DC | PRN
  Administered 2016-05-30: 50 mg via INTRAVENOUS

## 2016-05-30 MED ORDER — SODIUM CHLORIDE 0.9 % IV SOLN
INTRAVENOUS | Status: DC
  Administered 2016-05-30: 15:00:00 via INTRAVENOUS

## 2016-05-30 MED ORDER — SODIUM CHLORIDE 0.9 % IV SOLN
80.0000 mg | Freq: Once | INTRAVENOUS | Status: AC
  Administered 2016-05-30: 13:00:00 80 mg via INTRAVENOUS
  Filled 2016-05-30: qty 80

## 2016-05-30 MED ORDER — TIMOLOL MALEATE 0.5 % OP SOLN
1.0000 [drp] | Freq: Two times a day (BID) | OPHTHALMIC | Status: DC
  Administered 2016-05-30: 1 [drp] via OPHTHALMIC
  Filled 2016-05-30: qty 5

## 2016-05-30 MED ORDER — SUCRALFATE 1 G PO TABS
1.0000 g | ORAL_TABLET | Freq: Three times a day (TID) | ORAL | Status: DC
  Administered 2016-05-30 – 2016-05-31 (×3): 1 g via ORAL
  Filled 2016-05-30 (×3): qty 1

## 2016-05-30 MED ORDER — BUDESONIDE 0.5 MG/2ML IN SUSP
0.5000 mg | Freq: Two times a day (BID) | RESPIRATORY_TRACT | Status: DC
  Administered 2016-05-30 – 2016-05-31 (×2): 0.5 mg via RESPIRATORY_TRACT
  Filled 2016-05-30 (×2): qty 2

## 2016-05-30 MED ORDER — POTASSIUM CHLORIDE CRYS ER 10 MEQ PO TBCR
10.0000 meq | EXTENDED_RELEASE_TABLET | Freq: Three times a day (TID) | ORAL | Status: DC
  Administered 2016-05-30 – 2016-05-31 (×2): 10 meq via ORAL
  Filled 2016-05-30 (×2): qty 1

## 2016-05-30 MED ORDER — LORATADINE 10 MG PO TABS: 10.0000 mg | ORAL_TABLET | Freq: Every day | ORAL | Status: DC | PRN

## 2016-05-30 MED ORDER — CYCLOSPORINE 0.05 % OP EMUL
1.0000 [drp] | Freq: Two times a day (BID) | OPHTHALMIC | Status: DC
  Administered 2016-05-30 – 2016-05-31 (×2): 1 [drp] via OPHTHALMIC
  Filled 2016-05-30: qty 1

## 2016-05-30 MED ORDER — TETRAHYDROZOLINE HCL 0.05 % OP SOLN
1.0000 [drp] | OPHTHALMIC | Status: DC | PRN
  Filled 2016-05-30: qty 15

## 2016-05-30 NOTE — ED Triage Notes (Signed)
Pt arrives to ER via [facility] from [facility]. Pt states she has reccurrent GI bleeds and received scheduled iron and blood infusions. Pt reports dark black stool for past 2 days. Denies pain. VSS

## 2016-05-30 NOTE — Anesthesia Post-op Follow-up Note (Cosign Needed)
Anesthesia QCDR form completed.        

## 2016-05-30 NOTE — Anesthesia Procedure Notes (Signed)
Date/Time: 05/30/2016 3:37 PM Performed by: Demetrius Charity Pre-anesthesia Checklist: Patient identified, Emergency Drugs available, Suction available, Patient being monitored and Timeout performed Patient Re-evaluated:Patient Re-evaluated prior to inductionOxygen Delivery Method: Nasal cannula

## 2016-05-30 NOTE — Anesthesia Preprocedure Evaluation (Signed)
Anesthesia Evaluation  Patient identified by MRN, date of birth, ID band Patient awake    Reviewed: Allergy & Precautions, NPO status , Patient's Chart, lab work & pertinent test results, reviewed documented beta blocker date and time   Airway Mallampati: III  TM Distance: >3 FB     Dental  (+) Chipped   Pulmonary pneumonia, resolved, COPD, former smoker,    Pulmonary exam normal        Cardiovascular hypertension, Pt. on medications and Pt. on home beta blockers Normal cardiovascular exam     Neuro/Psych PSYCHIATRIC DISORDERS Depression    GI/Hepatic Neg liver ROS, PUD, GERD  ,  Endo/Other  negative endocrine ROS  Renal/GU negative Renal ROS     Musculoskeletal  (+) Arthritis , Osteoarthritis,  Fibromyalgia -  Abdominal   Peds  Hematology  (+) anemia ,   Anesthesia Other Findings Gout. Will avoid fentanyl. Hx of AVMs  Reproductive/Obstetrics                             Anesthesia Physical  Anesthesia Plan  ASA: III  Anesthesia Plan: General   Post-op Pain Management:    Induction: Intravenous  Airway Management Planned: Nasal Cannula  Additional Equipment:   Intra-op Plan:   Post-operative Plan:   Informed Consent: I have reviewed the patients History and Physical, chart, labs and discussed the procedure including the risks, benefits and alternatives for the proposed anesthesia with the patient or authorized representative who has indicated his/her understanding and acceptance.     Plan Discussed with: CRNA  Anesthesia Plan Comments:         Anesthesia Quick Evaluation

## 2016-05-30 NOTE — Progress Notes (Signed)
Pt came to floor at 1430. VSS. Pt was oriented to room and safety plan.

## 2016-05-30 NOTE — Transfer of Care (Signed)
Immediate Anesthesia Transfer of Care Note  Patient: [name] [name]  Procedure(s) Performed: Procedure(s): push ENTEROSCOPY (N/A)  Patient Location: PACU  Anesthesia Type:General  Level of Consciousness: awake and alert   Airway & Oxygen Therapy: Patient Spontanous Breathing and Patient connected to nasal cannula oxygen  Post-op Assessment: Report given to RN and Post -op Vital signs reviewed and stable  Post vital signs: Reviewed and stable  Last Vitals:  Vitals:   05/30/16 1443 05/30/16 1504  BP: (!) 147/55 (!) 147/55  Pulse: 84 84  Resp: 14 16  Temp: 36.9 C 36.9 C    Last Pain:  Vitals:   05/30/16 1504  TempSrc: Tympanic  PainSc:          Complications: No apparent anesthesia complications

## 2016-05-30 NOTE — H&P (Signed)
[name] [place] NAME: [name] [name]    MR#:  [id number]  DATE OF BIRTH:  [birth year]  DATE OF ADMISSION:  05/30/2016  PRIMARY CARE PHYSICIAN: Gearldine Shown, DO   REQUESTING/REFERRING PHYSICIAN: Dr Darel Hong  CHIEF COMPLAINT:   Chief Complaint  Patient presents with  . GI Bleeding    HISTORY OF PRESENT ILLNESS:  [name] [name]  is a 83 y.o. female with a known history of Chronic GI bleed. She presents back to the hospital with lower abdominal cramping pain 10 out of 10 intensity and black stools once a day. Her hemoglobin in the ER is 6.7. She states that she's been following up at the cancer center for iron infusions and transfusions. Abdominal pain eases off with a bowel movement. Nothing else makes the pain better or worse. Her stools are always dark or black. No nausea or vomiting. Patient does have a history of small bowel angiectasias. The last one was treated with cauterization on 05/10/2016.  PAST MEDICAL HISTORY:   Past Medical History:  Diagnosis Date  . Anemia   . COPD (chronic obstructive pulmonary disease) ([place])   . Diverticulitis   . GERD (gastroesophageal reflux disease)   . Glaucoma   . HLD (hyperlipidemia)   . Hypertension   . Iron deficiency   . Rectal bleeding     PAST SURGICAL HISTORY:   Past Surgical History:  Procedure Laterality Date  . ABDOMINAL HYSTERECTOMY    . APPENDECTOMY    . COLONOSCOPY WITH PROPOFOL N/A 04/14/2016   Procedure: COLONOSCOPY WITH PROPOFOL;  Surgeon: Lucilla Lame, MD;  Location: [facility] ENDOSCOPY;  Service: Endoscopy;  Laterality: N/A;  . ENTEROSCOPY N/A 05/10/2016   Procedure: Push ENTEROSCOPY with pediatric colonoscope;  Surgeon: [name], MD;  Location: [facility] ENDOSCOPY;  Service: Endoscopy;  Laterality: N/A;  . ESOPHAGOGASTRODUODENOSCOPY (EGD) WITH PROPOFOL N/A 08/12/2015   Procedure: ESOPHAGOGASTRODUODENOSCOPY (EGD) WITH PROPOFOL;  Surgeon: Lollie Sails,  MD;  Location: [facility] ENDOSCOPY;  Service: Endoscopy;  Laterality: N/A;  . ESOPHAGOGASTRODUODENOSCOPY (EGD) WITH PROPOFOL N/A 04/03/2016   Procedure: ESOPHAGOGASTRODUODENOSCOPY (EGD) WITH PROPOFOL;  Surgeon: Lucilla Lame, MD;  Location: [facility] ENDOSCOPY;  Service: Endoscopy;  Laterality: N/A;  . GIVENS CAPSULE STUDY N/A 04/28/2016   Procedure: GIVENS CAPSULE STUDY;  Surgeon: [name], MD;  Location: [facility] ENDOSCOPY;  Service: Endoscopy;  Laterality: N/A;  . JOINT REPLACEMENT      SOCIAL HISTORY:   Social History  Substance Use Topics  . Smoking status: Former Smoker    Types: Cigarettes  . Smokeless tobacco: Never Used  . Alcohol use No    FAMILY HISTORY:   Family History  Problem Relation Age of Onset  . CAD Mother   . CAD Father   . CAD Brother     DRUG ALLERGIES:   Allergies  Allergen Reactions  . Codeine Nausea And Vomiting  . Dextrans Hives  . Fentanyl Itching  . Lipitor [Atorvastatin] Other (See Comments)    Reaction: Muscle pain  . Mobic [Meloxicam] Other (See Comments)    Reaction: Mouth and tongue ulcers  . Niacin And Related Itching  . Nsaids Hives  . Pravachol [Pravastatin Sodium] Other (See Comments)    Reaction: Muscle pain  . Statins   . Voltaren [Diclofenac Sodium] Other (See Comments)    Reaction: Mouth and tongue ulcers  . Zetia [Ezetimibe] Other (See Comments)    Reaction: Leg pain    REVIEW OF SYSTEMS:  CONSTITUTIONAL: No fever. Positive for  fatigue. Positive for sweating. Positive for weight loss. EYES: Has macular degeneration and wears reading glasses EARS, NOSE, AND THROAT: No tinnitus or ear pain. No sore throat. Wears hearing aids RESPIRATORY: No cough, shortness of breath, wheezing or hemoptysis.  CARDIOVASCULAR: No chest pain, orthopnea, edema.  GASTROINTESTINAL: No nausea, vomiting. Positive for melena and abdominal pain.  GENITOURINARY: No dysuria, hematuria.  ENDOCRINE: No polyuria, nocturia,  HEMATOLOGY: History of anemia SKIN: No  rash or lesion. MUSCULOSKELETAL: Positive for joint pain NEUROLOGIC: No tingling, numbness, weakness.  PSYCHIATRY: No anxiety or depression.   MEDICATIONS AT HOME:   Prior to Admission medications   Medication Sig Start Date End Date Taking? Authorizing Provider  acetaminophen (TYLENOL) 650 MG CR tablet Take 650 mg by mouth every 8 (eight) hours as needed for pain.   Yes [provider]  albuterol (PROVENTIL HFA;VENTOLIN HFA) 108 (90 Base) MCG/ACT inhaler Inhale 2 puffs into the lungs every 4 (four) hours as needed for wheezing or shortness of breath.   Yes [provider]  chlorthalidone (HYGROTON) 25 MG tablet Take 12.5 mg by mouth daily.  01/18/16  Yes [provider]  COENZYME Q10 PO Take 1 tablet by mouth daily.   Yes [provider]  colchicine 0.6 MG tablet Take 0.6 mg by mouth daily.  08/28/13  Yes [provider]  Cyanocobalamin (B-12) 1000 MCG CAPS Take 3,000 Units by mouth daily.    Yes [provider]  cycloSPORINE (RESTASIS) 0.05 % ophthalmic emulsion Place 1 drop into both eyes 2 (two) times daily.   Yes [provider]  fluticasone (FLOVENT HFA) 110 MCG/ACT inhaler Inhale 1 puff into the lungs 2 (two) times daily.  01/12/16 10/18/16 Yes [provider]  gabapentin (NEURONTIN) 300 MG capsule Take 300 mg by mouth 2 (two) times daily.   Yes [provider]  guaiFENesin (MUCINEX) 600 MG 12 hr tablet Take 600 mg by mouth 2 (two) times daily as needed.   Yes [provider]  hydrocortisone (PROCTOSOL HC) 2.5 % rectal cream PLACE RECTALLY TWICE A DAY 10/13/15  Yes [provider]  latanoprost (XALATAN) 0.005 % ophthalmic solution Apply 1 drop to eye at bedtime.  01/17/16 01/16/17 Yes [provider]  loratadine (CLARITIN) 10 MG tablet Take 10 mg by mouth daily as needed for allergies.   Yes [provider]  losartan (COZAAR) 50 MG tablet Take 50 mg by mouth daily.   Yes [provider]  mometasone (ELOCON) 0.1 % lotion Apply topically. 05/22/16 05/22/17 Yes [provider]  Multiple Vitamins-Minerals (OCUVITE PRESERVISION PO) Take 1 tablet by mouth 2 (two) times daily.   Yes [provider]  PARoxetine (PAXIL) 40 MG tablet Take 40 mg by mouth every morning.   Yes [provider]  Polyethyl Glycol-Propyl Glycol (SYSTANE) 0.4-0.3 % SOLN Apply 1 drop to eye as needed.   Yes [provider]  potassium chloride (K-DUR,KLOR-CON) 10 MEQ tablet Take 10 mEq by mouth 3 (three) times daily.    Yes [provider]  RABEprazole (ACIPHEX) 20 MG tablet Take 20 mg by mouth 2 (two) times daily.   Yes [provider]  sucralfate (CARAFATE) 1 g tablet Take 1 tablet (1 g total) by mouth 4 (four) times daily -  with meals and at bedtime. 08/13/15  Yes Dustin Flock, MD  Tetrahydroz-Dextran-PEG-Povid (EYE DROPS ADVANCED RELIEF OP) Apply to eye.   Yes [provider]  timolol (TIMOPTIC-XR) 0.5 % ophthalmic gel-forming Place 1 drop into  both eyes daily.   Yes [provider]  valACYclovir (VALTREX) 1000 MG tablet once daily as needed. Reported on 03/22/2015 01/18/13  Yes [provider]      VITAL SIGNS:  Blood pressure 128/64, pulse 78, temperature 97.9 F (36.6 C), temperature source Oral, resp. rate (!) 21, height 5\' 9"  (1.753 m), weight 83.9 kg (185 lb), SpO2 100 %.  PHYSICAL EXAMINATION:  GENERAL:  83 y.o.-year-old patient lying in the bed with no acute distress.  EYES: Pupils equal, round, reactive to light and accommodation. No scleral icterus. Extraocular muscles intact. Conjunctiva pale HEENT: Head atraumatic, normocephalic. Oropharynx and nasopharynx clear.  NECK:  Supple, no jugular venous distention. No thyroid enlargement, no tenderness.  LUNGS: Normal breath sounds bilaterally, no wheezing, rales,rhonchi or crepitation. No use of accessory muscles of respiration.  CARDIOVASCULAR: S1, S2  normal. 2/6 systolic murmurs. No rubs, or gallops.  ABDOMEN: Soft, nontender, nondistended. Bowel sounds present. No organomegaly or mass.  EXTREMITIES: No pedal edema, cyanosis, or clubbing.  NEUROLOGIC: Cranial nerves II through XII are intact. Muscle strength 5/5 in all extremities. Sensation intact. Gait not checked.  PSYCHIATRIC: The patient is alert and oriented x 3.  SKIN: No rash, lesion, or ulcer.   LABORATORY PANEL:   CBC  Recent Labs Lab 05/30/16 1018  WBC 7.5  HGB 6.7*  HCT 20.8*  PLT 310   ------------------------------------------------------------------------------------------------------------------  Chemistries   Recent Labs Lab 05/30/16 1018  NA 137  K 3.6  CL 104  CO2 26  GLUCOSE 103*  BUN 37*  CREATININE 0.62  CALCIUM 8.5*  AST 27  ALT 15  ALKPHOS 54  BILITOT 0.5   ------------------------------------------------------------------------------------------------------------------    IMPRESSION AND PLAN:   1. Acute on chronic blood loss anemia. Hemoglobin 6.7 transfused 2 units of packed red blood cells today. Also give IV iron tomorrow. Dr. Vicente Males wants the patient nothing by mouth for push enteroscope today. Close clinical follow-up at the cancer center for transfusions and iron infusions needed. May end up needing tertiary care center in the future. 2. Macular degeneration and glaucoma continue eyedrops 3. Essential hypertension on chlorthalidone and Cozaar 4. Arthritis. Tylenol for pain 5. Neuropathy on gabapentin   All the records are reviewed and case discussed with ED provider. Management plans discussed with the patient, family and they are in agreement.  CODE STATUS: DO NOT RESUSCITATE  TOTAL TIME TAKING CARE OF THIS PATIENT: 50 minutes.    Loletha Grayer M.D on 05/30/2016 at 12:45 PM  Between 7am to 6pm - Pager - [phone]  After 6pm call admission pager [phone]  [employer] Office  [phone]  CC: Primary care  physician; Gearldine Shown, DO

## 2016-05-30 NOTE — Anesthesia Postprocedure Evaluation (Signed)
Anesthesia Post Note  Patient: [name] [name]  Procedure(s) Performed: Procedure(s) (LRB): push ENTEROSCOPY (N/A)  Patient location during evaluation: Endoscopy Anesthesia Type: General Level of consciousness: awake and alert Pain management: pain level controlled Vital Signs Assessment: post-procedure vital signs reviewed and stable Respiratory status: spontaneous breathing, nonlabored ventilation, respiratory function stable and patient connected to nasal cannula oxygen Cardiovascular status: blood pressure returned to baseline and stable Postop Assessment: no signs of nausea or vomiting Anesthetic complications: no     Last Vitals:  Vitals:   05/30/16 1550 05/30/16 1600  BP:  (!) 153/61  Pulse:  89  Resp:  17  Temp: (!) 35.7 C     Last Pain:  Vitals:   05/30/16 1550  TempSrc: Tympanic  PainSc:                  [name]

## 2016-05-30 NOTE — ED Notes (Signed)
Admitting MD at bedside.

## 2016-05-30 NOTE — Op Note (Signed)
[facility] Gastroenterology Patient Name: [name] [name] Procedure Date: 05/30/2016 3:36 PM MRN: [id number] Account #: [id number] Date of Birth: 06-22-1925 Admit Type: Inpatient Age: 83 Room: [facility] ENDO ROOM 1 Gender: Female Note Status: Finalized Procedure:            Small bowel enteroscopy Indications:          Obscure gastrointestinal bleeding Providers:            [name] MD, MD Referring MD:         No Local Md, MD (Referring MD) Medicines:            Monitored Anesthesia Care Complications:        No immediate complications. Procedure:            Pre-Anesthesia Assessment:                       - Prior to the procedure, a History and Physical was                        performed, and patient medications, allergies and                        sensitivities were reviewed. The patient's tolerance of                        previous anesthesia was reviewed.                       - The risks and benefits of the procedure and the                        sedation options and risks were discussed with the                        patient. All questions were answered and informed                        consent was obtained.                       - ASA Grade Assessment: III - A patient with severe                        systemic disease.                       After obtaining informed consent, the endoscope was                        passed under direct vision. Throughout the procedure,                        the patient's blood pressure, pulse, and oxygen                        saturations were monitored continuously. The                        Colonoscope was introduced through the mouth and  advanced to the proximal jejunum. The small bowel                        enteroscopy was accomplished with ease. The patient                        tolerated the procedure well. Findings:      The esophagus was normal.      The stomach was normal.  The examined duodenum was normal.      There was no evidence of significant pathology in the entire examined       portion of jejunum. Impression:           - Normal esophagus.                       - Normal stomach.                       - Normal examined duodenum.                       - The examined portion of the jejunum was normal.                       - No specimens collected. Recommendation:       - Return patient to hospital ward for ongoing care.                       - Clear liquid diet today.                       - 1. Monitor overnight- if has no further signs of                        bleeding - suggest she keep her appointment which she                        has tomorrow with [facility] for enteroscopy evaluation .                        It is very likely she has AVMS in the mid jejunum , we                        did see some bleeding on her capsule study and we have                        ablated AVM's in her proximal jejunum recently                       2. If she has further GI bleeding during the night get                        tagged RBC scan to confirm the area of bleeding and                        then can have inpatient transfer to [facility] Procedure Code(s):    --- Professional ---  [phone], Small intestinal endoscopy, enteroscopy beyond                        second portion of duodenum, not including ileum;                        diagnostic, including collection of specimen(s) by                        brushing or washing, when performed (separate procedure) Diagnosis Code(s):    --- Professional ---                       K92.2, Gastrointestinal hemorrhage, unspecified CPT copyright 2016 American Medical Association. All rights reserved. The codes documented in this report are preliminary and upon coder review may  be revised to meet current compliance requirements. [name], MD [name] MD, MD 05/30/2016 3:48:51 PM This report has been  signed electronically. Number of Addenda: 0 Note Initiated On: 05/30/2016 3:36 PM      [facility]

## 2016-05-30 NOTE — ED Notes (Signed)
Jessi, RN from endo called stating Dr. Vicente Males wouldn't be ready until after 3pm.

## 2016-05-30 NOTE — H&P (Signed)
[name] Bellows MD [address], [name], [name] [address] Phone: [phone] Fax : [phone]  Primary Care Physician:  Gearldine Shown, DO Primary Gastroenterologist:  Dr. Jonathon [name]   Pre-Procedure History & Physical: HPI:  [name] [name] is a 83 y.o. female is here for an push enteroscopy .   Past Medical History:  Diagnosis Date  . Anemia   . COPD (chronic obstructive pulmonary disease) ([place])   . Diverticulitis   . GERD (gastroesophageal reflux disease)   . Glaucoma   . HLD (hyperlipidemia)   . Hypertension   . Iron deficiency   . Rectal bleeding     Past Surgical History:  Procedure Laterality Date  . ABDOMINAL HYSTERECTOMY    . APPENDECTOMY    . COLONOSCOPY WITH PROPOFOL N/A 04/14/2016   Procedure: COLONOSCOPY WITH PROPOFOL;  Surgeon: Lucilla Lame, MD;  Location: [facility] ENDOSCOPY;  Service: Endoscopy;  Laterality: N/A;  . ENTEROSCOPY N/A 05/10/2016   Procedure: Push ENTEROSCOPY with pediatric colonoscope;  Surgeon: [name] Bellows, MD;  Location: [facility] ENDOSCOPY;  Service: Endoscopy;  Laterality: N/A;  . ESOPHAGOGASTRODUODENOSCOPY (EGD) WITH PROPOFOL N/A 08/12/2015   Procedure: ESOPHAGOGASTRODUODENOSCOPY (EGD) WITH PROPOFOL;  Surgeon: Lollie Sails, MD;  Location: [facility] ENDOSCOPY;  Service: Endoscopy;  Laterality: N/A;  . ESOPHAGOGASTRODUODENOSCOPY (EGD) WITH PROPOFOL N/A 04/03/2016   Procedure: ESOPHAGOGASTRODUODENOSCOPY (EGD) WITH PROPOFOL;  Surgeon: Lucilla Lame, MD;  Location: [facility] ENDOSCOPY;  Service: Endoscopy;  Laterality: N/A;  . GIVENS CAPSULE STUDY N/A 04/28/2016   Procedure: GIVENS CAPSULE STUDY;  Surgeon: [name] Bellows, MD;  Location: [facility] ENDOSCOPY;  Service: Endoscopy;  Laterality: N/A;  . JOINT REPLACEMENT      Prior to Admission medications   Medication Sig Start Date End Date Taking? Authorizing Provider  acetaminophen (TYLENOL) 650 MG CR tablet Take 650 mg by mouth every 8 (eight) hours as needed for pain.   Yes [provider]    albuterol (PROVENTIL HFA;VENTOLIN HFA) 108 (90 Base) MCG/ACT inhaler Inhale 2 puffs into the lungs every 4 (four) hours as needed for wheezing or shortness of breath.   Yes [provider]  chlorthalidone (HYGROTON) 25 MG tablet Take 12.5 mg by mouth daily.  01/18/16  Yes [provider]  COENZYME Q10 PO Take 1 tablet by mouth daily.   Yes [provider]  colchicine 0.6 MG tablet Take 0.6 mg by mouth daily.  08/28/13  Yes [provider]  Cyanocobalamin (B-12) 1000 MCG CAPS Take 3,000 Units by mouth daily.    Yes [provider]  cycloSPORINE (RESTASIS) 0.05 % ophthalmic emulsion Place 1 drop into both eyes 2 (two) times daily.   Yes [provider]  fluticasone (FLOVENT HFA) 110 MCG/ACT inhaler Inhale 1 puff into the lungs 2 (two) times daily.  01/12/16 10/18/16 Yes [provider]  gabapentin (NEURONTIN) 300 MG capsule Take 300 mg by mouth 2 (two) times daily.   Yes [provider]  guaiFENesin (MUCINEX) 600 MG 12 hr tablet Take 600 mg by mouth 2 (two) times daily as needed.   Yes [provider]  hydrocortisone (PROCTOSOL HC) 2.5 % rectal cream PLACE RECTALLY TWICE A DAY 10/13/15  Yes [provider]  latanoprost (XALATAN) 0.005 % ophthalmic solution Apply 1 drop to eye at bedtime.  01/17/16 01/16/17 Yes [provider]  loratadine (CLARITIN) 10 MG tablet Take 10 mg by mouth daily as needed for allergies.   Yes [provider]  losartan (COZAAR) 50 MG tablet Take 50 mg by mouth daily.  Yes [provider]  mometasone (ELOCON) 0.1 % lotion Apply topically. 05/22/16 05/22/17 Yes [provider]  Multiple Vitamins-Minerals (OCUVITE PRESERVISION PO) Take 1 tablet by mouth 2 (two) times daily.   Yes [provider]  PARoxetine (PAXIL) 40 MG tablet Take 40 mg by mouth every morning.   Yes [provider]  Polyethyl Glycol-Propyl Glycol (SYSTANE) 0.4-0.3 % SOLN Apply 1  drop to eye as needed.   Yes [provider]  potassium chloride (K-DUR,KLOR-CON) 10 MEQ tablet Take 10 mEq by mouth 3 (three) times daily.    Yes [provider]  RABEprazole (ACIPHEX) 20 MG tablet Take 20 mg by mouth 2 (two) times daily.   Yes [provider]  sucralfate (CARAFATE) 1 g tablet Take 1 tablet (1 g total) by mouth 4 (four) times daily -  with meals and at bedtime. 08/13/15  Yes Dustin Flock, MD  Tetrahydroz-Dextran-PEG-Povid (EYE DROPS ADVANCED RELIEF OP) Apply to eye.   Yes [provider]  timolol (TIMOPTIC-XR) 0.5 % ophthalmic gel-forming Place 1 drop into both eyes daily.   Yes [provider]  valACYclovir (VALTREX) 1000 MG tablet once daily as needed. Reported on 03/22/2015 01/18/13  Yes [provider]    Allergies as of 05/30/2016 - Review Complete 05/30/2016  Allergen Reaction Noted  . Codeine Nausea And Vomiting 08/10/2015  . Dextrans Hives 08/10/2015  . Fentanyl Itching 08/10/2015  . Lipitor [atorvastatin] Other (See Comments) 08/10/2015  . Mobic [meloxicam] Other (See Comments) 08/10/2015  . Niacin and related Itching 08/10/2015  . Nsaids Hives 08/10/2015  . Pravachol [pravastatin sodium] Other (See Comments) 08/10/2015  . Statins  03/31/2016  . Voltaren [diclofenac sodium] Other (See Comments) 08/10/2015  . Zetia [ezetimibe] Other (See Comments) 08/10/2015    Family History  Problem Relation Age of Onset  . CAD Mother   . CAD Father   . CAD Brother     Social History   Social History  . Marital status: Widowed    Spouse name: N/A  . Number of children: N/A  . Years of education: N/A   Occupational History  . Not on file.   Social History Main Topics  . Smoking status: Former Smoker    Types: Cigarettes  . Smokeless tobacco: Never Used  . Alcohol use No  . Drug use: No  . Sexual activity: Not on file   Other Topics Concern  . Not on file   Social History Narrative  . No narrative on  file    Review of Systems: See HPI, otherwise negative ROS  Physical Exam: BP (!) 147/55   Pulse 84   Temp 98.4 F (36.9 C) (Tympanic)   Resp 16   Ht 5\' 9"  (1.753 m)   Wt 185 lb (83.9 kg)   SpO2 100%   BMI 27.32 kg/m  General:   Alert,  pleasant and cooperative in NAD Head:  Normocephalic and atraumatic. Neck:  Supple; no masses or thyromegaly. Lungs:  Clear throughout to auscultation.    Heart:  Regular rate and rhythm. Abdomen:  Soft, nontender and nondistended. Normal bowel sounds, without guarding, and without rebound.   Neurologic:  Alert and  oriented x4;  grossly normal neurologically.  Impression/Plan: Amaira Safley is here for an push enteroscopy  to be performed for melena  Risks, benefits, limitations, and alternatives regarding  Push enteroscopy  have been reviewed with the patient.  Questions have been answered.  All parties agreeable.   [name] Bellows, MD  05/30/2016, 3:34 PM

## 2016-05-30 NOTE — ED Provider Notes (Signed)
[facility] Emergency Department Provider Note  ____________________________________________   First MD Initiated Contact with Patient 05/30/16 1101     (approximate)  I have reviewed the triage vital signs and the nursing notes.   HISTORY  Chief Complaint GI Bleeding    HPI [name] [name] is a 83 y.o. female who comes to the emergency department via EMS for dark tarry stools yesterday and this morning. She has recently had a complicated past medical history of GI issues including multiple endoscopies, capsule colonoscopy, formal colonoscopy, and multiple jejunal AV malformations embolized. She is currently followed by Dr. Vicente Males.  One week ago she had her last procedure and was told to be normal to have dark stool shortly thereafter but if they recurred to come to the hospital. She does report mild epigastric discomfort. Nothing makes it better or worse. She is somewhat nauseated but has not vomited. No diarrhea. She is mostly anxious and tearful because she wants this to be over with.   Past Medical History:  Diagnosis Date  . Anemia   . COPD (chronic obstructive pulmonary disease) ([place])   . Diverticulitis   . GERD (gastroesophageal reflux disease)   . Glaucoma   . HLD (hyperlipidemia)   . Hypertension   . Iron deficiency   . Rectal bleeding     Patient Active Problem List   Diagnosis Date Noted  . Anemia 05/30/2016  . Angiodysplasia of intestinal tract   . Blood in stool   . Benign neoplasm of ascending colon   . UGIB (upper gastrointestinal bleed)   . GIB (gastrointestinal bleeding) 04/12/2016  . Melena   . Healthcare-associated pertussis 10/10/2015  . Healthcare-associated pneumonia 10/10/2015  . GI bleed 08/10/2015  . Iron deficiency anemia due to chronic blood loss 08/10/2015  . HTN (hypertension), benign 08/10/2015  . Other and unspecified hyperlipidemia 08/10/2015  . GERD (gastroesophageal reflux disease) 08/10/2015  . Chronic  obstructive pulmonary disease ([place]) 08/10/2015  . Glaucoma suspect 12/26/2012  . Fatigue 02/12/2012  . H/O total hip arthroplasty 01/25/2012  . Total knee replacement status 01/25/2012  . Arthritis of knee 01/18/2012  . ARMD (age related macular degeneration) 11/21/2011  . Foot drop 11/15/2011  . Hallux valgus 11/15/2011  . Hammer toe, acquired 11/15/2011  . Recurrent major depressive disorder, in partial remission ([place]) 07/10/2011  . Degenerative disc disease 04/11/2011  . Fibromyalgia 02/10/2011  . Diverticulosis of colon 07/18/2010  . Gastric ulcer 07/18/2010  . Hereditary and idiopathic peripheral neuropathy 07/18/2010  . Meniere's disease 07/18/2010  . Osteoarthritis, generalized 07/18/2010  . Hemorrhoids 07/18/2010    Past Surgical History:  Procedure Laterality Date  . ABDOMINAL HYSTERECTOMY    . APPENDECTOMY    . COLONOSCOPY WITH PROPOFOL N/A 04/14/2016   Procedure: COLONOSCOPY WITH PROPOFOL;  Surgeon: Lucilla Lame, MD;  Location: [facility] ENDOSCOPY;  Service: Endoscopy;  Laterality: N/A;  . ENTEROSCOPY N/A 05/10/2016   Procedure: Push ENTEROSCOPY with pediatric colonoscope;  Surgeon: [name], MD;  Location: [facility] ENDOSCOPY;  Service: Endoscopy;  Laterality: N/A;  . ESOPHAGOGASTRODUODENOSCOPY (EGD) WITH PROPOFOL N/A 08/12/2015   Procedure: ESOPHAGOGASTRODUODENOSCOPY (EGD) WITH PROPOFOL;  Surgeon: Lollie Sails, MD;  Location: [facility] ENDOSCOPY;  Service: Endoscopy;  Laterality: N/A;  . ESOPHAGOGASTRODUODENOSCOPY (EGD) WITH PROPOFOL N/A 04/03/2016   Procedure: ESOPHAGOGASTRODUODENOSCOPY (EGD) WITH PROPOFOL;  Surgeon: Lucilla Lame, MD;  Location: [facility] ENDOSCOPY;  Service: Endoscopy;  Laterality: N/A;  . GIVENS CAPSULE STUDY N/A 04/28/2016   Procedure: GIVENS CAPSULE STUDY;  Surgeon: [name], MD;  Location: [facility] ENDOSCOPY;  Service: Endoscopy;  Laterality: N/A;  . JOINT REPLACEMENT      Prior to Admission medications   Medication Sig Start Date End Date Taking? Authorizing  Provider  acetaminophen (TYLENOL) 650 MG CR tablet Take 650 mg by mouth every 8 (eight) hours as needed for pain.   Yes [provider]  albuterol (PROVENTIL HFA;VENTOLIN HFA) 108 (90 Base) MCG/ACT inhaler Inhale 2 puffs into the lungs every 4 (four) hours as needed for wheezing or shortness of breath.   Yes [provider]  chlorthalidone (HYGROTON) 25 MG tablet Take 12.5 mg by mouth daily.  01/18/16  Yes [provider]  COENZYME Q10 PO Take 1 tablet by mouth daily.   Yes [provider]  colchicine 0.6 MG tablet Take 0.6 mg by mouth daily.  08/28/13  Yes [provider]  Cyanocobalamin (B-12) 1000 MCG CAPS Take 3,000 Units by mouth daily.    Yes [provider]  cycloSPORINE (RESTASIS) 0.05 % ophthalmic emulsion Place 1 drop into both eyes 2 (two) times daily.   Yes [provider]  fluticasone (FLOVENT HFA) 110 MCG/ACT inhaler Inhale 1 puff into the lungs 2 (two) times daily.  01/12/16 10/18/16 Yes [provider]  gabapentin (NEURONTIN) 300 MG capsule Take 300 mg by mouth 2 (two) times daily.   Yes [provider]  guaiFENesin (MUCINEX) 600 MG 12 hr tablet Take 600 mg by mouth 2 (two) times daily as needed.   Yes [provider]  hydrocortisone (PROCTOSOL HC) 2.5 % rectal cream PLACE RECTALLY TWICE A DAY 10/13/15  Yes [provider]  latanoprost (XALATAN) 0.005 % ophthalmic solution Apply 1 drop to eye at bedtime.  01/17/16 01/16/17 Yes [provider]  loratadine (CLARITIN) 10 MG tablet Take 10 mg by mouth daily as needed for allergies.   Yes [provider]  losartan (COZAAR) 50 MG tablet Take 50 mg by mouth daily.   Yes [provider]  mometasone (ELOCON) 0.1 % lotion Apply topically. 05/22/16 05/22/17 Yes [provider]  Multiple Vitamins-Minerals (OCUVITE PRESERVISION PO) Take 1 tablet by mouth 2 (two) times daily.   Yes [provider]  PARoxetine (PAXIL)  40 MG tablet Take 40 mg by mouth every morning.   Yes [provider]  Polyethyl Glycol-Propyl Glycol (SYSTANE) 0.4-0.3 % SOLN Apply 1 drop to eye as needed.   Yes [provider]  potassium chloride (K-DUR,KLOR-CON) 10 MEQ tablet Take 10 mEq by mouth 3 (three) times daily.    Yes [provider]  RABEprazole (ACIPHEX) 20 MG tablet Take 20 mg by mouth 2 (two) times daily.   Yes [provider]  sucralfate (CARAFATE) 1 g tablet Take 1 tablet (1 g total) by mouth 4 (four) times daily -  with meals and at bedtime. 08/13/15  Yes Dustin Flock, MD  Tetrahydroz-Dextran-PEG-Povid (EYE DROPS ADVANCED RELIEF OP) Apply to eye.   Yes [provider]  timolol (TIMOPTIC-XR) 0.5 % ophthalmic gel-forming Place 1 drop into both eyes daily.   Yes [provider]  valACYclovir (VALTREX) 1000 MG tablet once daily as needed. Reported on 03/22/2015 01/18/13  Yes [provider]    Allergies Codeine; Dextrans; Fentanyl; Lipitor [atorvastatin]; Mobic [meloxicam]; Niacin and related; Nsaids; Pravachol [pravastatin sodium]; Statins; Voltaren [diclofenac sodium]; and Zetia [ezetimibe]  Family History  Problem Relation Age of Onset  . CAD Mother   . CAD Father   . CAD Brother     Social History Social  History  Substance Use Topics  . Smoking status: Former Smoker    Types: Cigarettes  . Smokeless tobacco: Never Used  . Alcohol use No    Review of Systems Constitutional: No fever/chills Eyes: No visual changes. ENT: No sore throat. Cardiovascular: Denies chest pain. Respiratory: Denies shortness of breath. Gastrointestinal: Positive abdominal pain.  Positive nausea, no vomiting.  No diarrhea.  No constipation. Genitourinary: Negative for dysuria. Musculoskeletal: Negative for back pain. Skin: Negative for rash. Neurological: Negative for headaches, focal weakness or numbness.   ____________________________________________   PHYSICAL  EXAM:  VITAL SIGNS: ED Triage Vitals  Enc Vitals Group     BP 05/30/16 1016 140/63     Pulse Rate 05/30/16 1016 85     Resp 05/30/16 1016 18     Temp 05/30/16 1016 97.9 F (36.6 C)     Temp Source 05/30/16 1016 Oral     SpO2 05/30/16 1016 98 %     Weight 05/30/16 1019 185 lb (83.9 kg)     Height 05/30/16 1019 5\' 9"  (1.753 m)     Head Circumference --      Peak Flow --      Pain Score 05/30/16 1016 0     Pain Loc --      Pain Edu? --      Excl. in [place]? --     Constitutional: Alert and oriented x 4 well appearing nontoxic no diaphoresis speaks in full, clear sentences Eyes: PERRL EOMI. Head: Atraumatic. Nose: No congestion/rhinnorhea. Mouth/Throat: No trismus Neck: No stridor.   Cardiovascular: Normal rate, regular rhythm. Grossly normal heart sounds.  Good peripheral circulation. Respiratory: Normal respiratory effort.  No retractions. Lungs CTAB and moving good air Gastrointestinal: Soft nondistended nontender no rebound or guarding no peritonitis Rectal exam performed with female chaperone [place]ley: Normal external exam no melena brown stool guaiac positive control positive Musculoskeletal: No lower extremity edema   Neurologic:  Normal speech and language. No gross focal neurologic deficits are appreciated. Skin:  Skin is warm, dry and intact. No rash noted. Psychiatric: Mood and affect are normal. Speech and behavior are normal.    ____________________________________________   DIFFERENTIAL  Upper GI bleed, lower GI bleed, anemia ____________________________________________   LABS (all labs ordered are listed, but only abnormal results are displayed)  Labs Reviewed  COMPREHENSIVE METABOLIC PANEL - Abnormal; Notable for the following:       Result Value   Glucose, Bld 103 (*)    BUN 37 (*)    Calcium 8.5 (*)    Total Protein 6.0 (*)    Albumin 3.4 (*)    All other components within normal limits  CBC - Abnormal; Notable for the following:    RBC 2.46 (*)     Hemoglobin 6.7 (*)    HCT 20.8 (*)    RDW 27.1 (*)    All other components within normal limits  PROTIME-INR  POC OCCULT BLOOD, ED  TYPE AND SCREEN  PREPARE RBC (CROSSMATCH)    Hemoglobin 6.7 slightly up from previous of 6.5 __________________________________________  EKG  ED ECG REPORT I, Darel Hong, the attending physician, personally viewed and interpreted this ECG.  Date: 05/30/2016 Rate: 87 Rhythm: normal sinus rhythm QRS Axis: normal Intervals: normal ST/T Wave abnormalities: normal Conduction Disturbances: none Narrative Interpretation: unremarkable  ____________________________________________  RADIOLOGY   ____________________________________________   PROCEDURES  Procedure(s) performed: no  Procedures  Critical Care performed: yes  CRITICAL CARE Performed by: Darel Hong   Total critical care time:  32 minutes  Critical care time was exclusive of separately billable procedures and treating other patients.  Critical care was necessary to treat or prevent imminent or life-threatening deterioration.  Critical care was time spent personally by me on the following activities: development of treatment plan with patient and/or surrogate as well as nursing, discussions with consultants, evaluation of patient's response to treatment, examination of patient, obtaining history from patient or surrogate, ordering and performing treatments and interventions, ordering and review of laboratory studies, ordering and review of radiographic studies, pulse oximetry and re-evaluation of patient's condition.   Observation: no ____________________________________________   INITIAL IMPRESSION / ASSESSMENT AND PLAN / ED COURSE  Pertinent labs & imaging results that were available during my care of the patient were reviewed by me and considered in my medical decision making (see chart for details).  On arrival the patient is hemodynamically stable although  slightly tachycardic. She does not have melena but is guaiac positive. Dr. Georgeann Oppenheim last note said:   Plan  1. IV iron per hematology 2. CBC in 1 week if continues to drop would require repeat push enteroscopy and if negative single balloon enteroscopy at [facility] or [facility]  He is on call today and I will reach out to him now.     Dr. Vicente Males came down to the bedside and recommends urgent endoscopy today given the likely continual GI bleed. The patient will require inpatient admission. Protonix for now. ____________________________________________   FINAL CLINICAL IMPRESSION(S) / ED DIAGNOSES  Final diagnoses:  Upper GI bleed  Anemia, unspecified type      NEW MEDICATIONS STARTED DURING THIS VISIT:  New Prescriptions   No medications on file     Note:  This document was prepared using Dragon voice recognition software and may include unintentional dictation errors.     Darel Hong, MD 05/30/16 1249

## 2016-05-30 NOTE — Consult Note (Signed)
[name] MD  [address] [place], [place] [address] Phone: [phone] Fax : [phone]  Consultation  Referring Provider:     Dr Mable Paris Primary Care Physician:  Gearldine Shown, DO Primary Gastroenterologist:  Dr. Vicente Males          Reason for Consultation:     Black stool  Date of Admission:  05/30/2016 Date of Consultation:  05/30/2016         HPI:   [name] [name] is a 83 y.o. female who is well known to me.  Summary of history :   I was consulted to see her when she was admitted to the hospital with Melena, iron deficiency anemia . She had previously had an EGD which was normal except for a hiatal hernia , discharged in 4/18 with recurrence of black stools. She had a normal colonoscopy . A capsule study showed bleeding in the proximal jejunum. 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 Iron studies from 05/23/16  show a ferritin of 7 . She was in the process of obtaining IV iron .   I saw her at my office on 05/24/16 and she had not had any further overt bleeding . She says yesterday all of a sudden had a small tarry black bowel movement followed by a very large one this morning, felt dizzy and tired. Denies any NSAID use, hematemesis or abdominal pain.   Past Medical History:  Diagnosis Date  . Anemia   . COPD (chronic obstructive pulmonary disease) ([place])   . Diverticulitis   . GERD (gastroesophageal reflux disease)   . Glaucoma   . HLD (hyperlipidemia)   . Hypertension   . Iron deficiency   . Rectal bleeding     Past Surgical History:  Procedure Laterality Date  . ABDOMINAL HYSTERECTOMY    . APPENDECTOMY    . COLONOSCOPY WITH PROPOFOL N/A 04/14/2016   Procedure: COLONOSCOPY WITH PROPOFOL;  Surgeon: Lucilla Lame, MD;  Location: [facility] ENDOSCOPY;  Service: Endoscopy;  Laterality: N/A;  . ENTEROSCOPY N/A 05/10/2016   Procedure: Push ENTEROSCOPY with pediatric colonoscope;  Surgeon: [name], MD;  Location:  [facility] ENDOSCOPY;  Service: Endoscopy;  Laterality: N/A;  . ESOPHAGOGASTRODUODENOSCOPY (EGD) WITH PROPOFOL N/A 08/12/2015   Procedure: ESOPHAGOGASTRODUODENOSCOPY (EGD) WITH PROPOFOL;  Surgeon: Lollie Sails, MD;  Location: [facility] ENDOSCOPY;  Service: Endoscopy;  Laterality: N/A;  . ESOPHAGOGASTRODUODENOSCOPY (EGD) WITH PROPOFOL N/A 04/03/2016   Procedure: ESOPHAGOGASTRODUODENOSCOPY (EGD) WITH PROPOFOL;  Surgeon: Lucilla Lame, MD;  Location: [facility] ENDOSCOPY;  Service: Endoscopy;  Laterality: N/A;  . GIVENS CAPSULE STUDY N/A 04/28/2016   Procedure: GIVENS CAPSULE STUDY;  Surgeon: [name], MD;  Location: [facility] ENDOSCOPY;  Service: Endoscopy;  Laterality: N/A;  . JOINT REPLACEMENT      Prior to Admission medications   Medication Sig Start Date End Date Taking? Authorizing Provider  acetaminophen (TYLENOL) 650 MG CR tablet Take 650 mg by mouth every 8 (eight) hours as needed for pain.    [provider]  albuterol (PROVENTIL HFA;VENTOLIN HFA) 108 (90 Base) MCG/ACT inhaler Inhale 2 puffs into the lungs every 4 (four) hours as needed for wheezing or shortness of breath.    [provider]  chlorthalidone (HYGROTON) 25 MG tablet Take 12.5 mg by mouth daily.  01/18/16   [provider]  COENZYME Q10 PO Take 1 tablet by mouth daily.    [provider]  colchicine 0.6 MG tablet Take 0.6 mg by mouth daily.  08/28/13   [provider]  Cyanocobalamin (B-12) 1000 MCG CAPS Take 2,000 Units by mouth 2 (two) times daily.    [provider]  cycloSPORINE (RESTASIS) 0.05 % ophthalmic emulsion Place 1 drop into both eyes 2 (two) times daily.    [provider]  fluticasone (FLOVENT HFA) 110 MCG/ACT inhaler Inhale 1 puff into the lungs 2 (two) times daily.  01/12/16 10/18/16  [provider]  gabapentin (NEURONTIN) 300 MG capsule Take 300 mg by mouth 2 (two) times daily.    [provider]  guaiFENesin (MUCINEX) 600 MG 12 hr tablet Take 600 mg  by mouth 2 (two) times daily as needed.    [provider]  hydrocortisone (PROCTOSOL HC) 2.5 % rectal cream PLACE RECTALLY TWICE A DAY 10/13/15   [provider]  latanoprost (XALATAN) 0.005 % ophthalmic solution Apply 1 drop to eye at bedtime.  01/17/16 01/16/17  [provider]  loratadine (CLARITIN) 10 MG tablet Take 10 mg by mouth daily as needed for allergies.    [provider]  losartan (COZAAR) 50 MG tablet Take 50 mg by mouth daily.    [provider]  mometasone (ELOCON) 0.1 % lotion Apply topically. 05/22/16 05/22/17  [provider]  Multiple Vitamin (MULTIVITAMIN) tablet Take 1 tablet by mouth daily.    [provider]  Multiple Vitamins-Minerals (OCUVITE PRESERVISION PO) Take 1 tablet by mouth 2 (two) times daily.    [provider]  PARoxetine (PAXIL) 40 MG tablet Take 40 mg by mouth every morning.    [provider]  Polyethyl Glycol-Propyl Glycol (SYSTANE) 0.4-0.3 % SOLN Apply 1 drop to eye as needed.    [provider]  potassium chloride (K-DUR,KLOR-CON) 10 MEQ tablet Take 10 mEq by mouth 3 (three) times daily.     [provider]  RABEprazole (ACIPHEX) 20 MG tablet Take 20 mg by mouth 2 (two) times daily.    [provider]  saccharomyces boulardii (FLORASTOR) 250 MG capsule Take 250 mg by mouth daily.    [provider]  sucralfate (CARAFATE) 1 g tablet Take 1 tablet (1 g total) by mouth 4 (four) times daily -  with meals and at bedtime. 08/13/15   Dustin Flock, MD  Tetrahydroz-Dextran-PEG-Povid (EYE DROPS ADVANCED RELIEF OP) Apply to eye.    [provider]  timolol (TIMOPTIC-XR) 0.5 % ophthalmic gel-forming Place 1 drop into both eyes daily.    [provider]  valACYclovir (VALTREX) 1000 MG tablet once daily as needed. Reported on 03/22/2015 01/18/13   [provider]    Family History  Problem Relation Age of Onset  . CAD Mother   .  CAD Father   . CAD Brother      Social History  Substance Use Topics  . Smoking status: Former Smoker    Types: Cigarettes  . Smokeless tobacco: Never Used  . Alcohol use No    Allergies as of 05/30/2016 - Review Complete 05/30/2016  Allergen Reaction Noted  . Codeine Nausea And Vomiting 08/10/2015  . Dextrans Hives 08/10/2015  . Fentanyl Itching 08/10/2015  . Lipitor [atorvastatin] Other (See Comments) 08/10/2015  . Mobic [meloxicam] Other (See Comments) 08/10/2015  . Niacin and related Itching 08/10/2015  . Nsaids Hives 08/10/2015  . Pravachol [pravastatin sodium] Other (See Comments) 08/10/2015  . Statins  03/31/2016  . Voltaren [diclofenac sodium] Other (See Comments) 08/10/2015  . Zetia [ezetimibe] Other (See Comments) 08/10/2015    Review of Systems:  All systems reviewed and negative except where noted in HPI.   Physical Exam:  Vital signs in last 24 hours: Temp:  [97.9 F (36.6 C)] 97.9 F (36.6 C) (05/22 1016) Pulse Rate:  [85-109] 109 (05/22 1109) Resp:  [13-23] 23 (05/22 1109) BP: (127-176)/(57-63) 176/60 (05/22 1109) SpO2:  [98 %-100 %] 100 % (05/22 1109) Weight:  [185 lb (83.9 kg)] 185 lb (83.9 kg) (05/22 1019)   General:   Pleasant, cooperative in NAD Head:  Normocephalic and atraumatic. Eyes:   No icterus.   Conjunctiva pink. PERRLA. Ears:  Normal auditory acuity. Neck:  Supple; no masses or thyroidomegaly Lungs: Respirations even and unlabored. Lungs clear to auscultation bilaterally.   No wheezes, crackles, or rhonchi.  Heart:  Regular rate and rhythm;  Without murmur, clicks, rubs or gallops Abdomen:  Soft, nondistended, nontender. Normal bowel sounds. No appreciable masses or hepatomegaly.  No rebound or guarding.  Rectal:  Not performed. Msk:  Symmetrical without gross deformities.  xtremities:  Without edema, cyanosis or clubbing. Neurologic:  Alert and oriented x3;  grossly normal neurologically. Cervical Nodes:  No significant cervical  adenopathy. Psych:  Alert and cooperative. Normal affect.  LAB RESULTS:  Recent Labs  05/30/16 1018  WBC 7.5  HGB 6.7*  HCT 20.8*  PLT 310   BMET  Recent Labs  05/30/16 1018  NA 137  K 3.6  CL 104  CO2 26  GLUCOSE 103*  BUN 37*  CREATININE 0.62  CALCIUM 8.5*   LFT  Recent Labs  05/30/16 1018  PROT 6.0*  ALBUMIN 3.4*  AST 27  ALT 15  ALKPHOS 54  BILITOT 0.5   PT/INR  Recent Labs  05/30/16 1018  LABPROT 14.2  INR 1.10    STUDIES: No results found.    Impression / Plan:   [name] [name] is a 83 y.o. 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  Receiving  IV iron .   Plan  1. Monitor CBC and transfuse 1 unit today 2. . Push enteroscopy later today around 330 pm  3. PPI, NPO  I have discussed alternative options, risks & benefits,  which include, but are not limited to, bleeding, infection, perforation,respiratory complication & drug reaction.  The patient agrees with this plan & written consent will be obtained.     Thank you for involving me in the care of this patient.      LOS: 0 days   [name], MD  05/30/2016, 11:21 AM

## 2016-05-31 DIAGNOSIS — K922 Gastrointestinal hemorrhage, unspecified: Secondary | ICD-10-CM | POA: Diagnosis not present

## 2016-05-31 DIAGNOSIS — K921 Melena: Secondary | ICD-10-CM | POA: Diagnosis not present

## 2016-05-31 LAB — CBC
HCT: 28.9 % — ABNORMAL LOW (ref 35.0–47.0)
Hemoglobin: 9.6 g/dL — ABNORMAL LOW (ref 12.0–16.0)
MCH: 27.1 pg (ref 26.0–34.0)
MCHC: 33 g/dL (ref 32.0–36.0)
MCV: 81.9 fL (ref 80.0–100.0)
PLATELETS: 274 10*3/uL (ref 150–440)
RBC: 3.53 MIL/uL — ABNORMAL LOW (ref 3.80–5.20)
RDW: 20.6 % — ABNORMAL HIGH (ref 11.5–14.5)
WBC: 5.4 10*3/uL (ref 3.6–11.0)

## 2016-05-31 LAB — TYPE AND SCREEN
ABO/RH(D): O NEG
ANTIBODY SCREEN: NEGATIVE
UNIT DIVISION: 0
Unit division: 0

## 2016-05-31 LAB — BPAM RBC
BLOOD PRODUCT EXPIRATION DATE: 201806072359
Blood Product Expiration Date: 201806072359
ISSUE DATE / TIME: 201805221723
ISSUE DATE / TIME: 201805222130
UNIT TYPE AND RH: 9500
UNIT TYPE AND RH: 9500

## 2016-05-31 LAB — BASIC METABOLIC PANEL
Anion gap: 6 (ref 5–15)
BUN: 25 mg/dL — AB (ref 6–20)
CALCIUM: 8.5 mg/dL — AB (ref 8.9–10.3)
CO2: 29 mmol/L (ref 22–32)
CREATININE: 0.64 mg/dL (ref 0.44–1.00)
Chloride: 104 mmol/L (ref 101–111)
GFR calc Af Amer: >60 mL/min (ref >60)
Glucose, Bld: 95 mg/dL (ref 65–99)
Potassium: 3.1 mmol/L — ABNORMAL LOW (ref 3.5–5.1)
Sodium: 139 mmol/L (ref 135–145)

## 2016-05-31 MED ORDER — POTASSIUM CHLORIDE CRYS ER 20 MEQ PO TBCR
40.0000 meq | EXTENDED_RELEASE_TABLET | Freq: Once | ORAL | Status: AC
  Administered 2016-05-31: 40 meq via ORAL
  Filled 2016-05-31: qty 2

## 2016-05-31 NOTE — Care Management (Signed)
Patient from [facility] independent.  Patient is mobile and not RNCM needs identified.  Patient to follow up with [facility] outpatient today

## 2016-05-31 NOTE — Discharge Summary (Signed)
[name] [name] at [facility] NAME: [name] [name]    MR#:  [id number]  DATE OF BIRTH:  February 06, 1925  DATE OF ADMISSION:  05/30/2016 ADMITTING PHYSICIAN: Loletha Grayer, MD  DATE OF DISCHARGE: 05/31/2016  1:19 PM  PRIMARY CARE PHYSICIAN: Gearldine Shown, DO   ADMISSION DIAGNOSIS:  Upper GI bleed [K92.2] Anemia, unspecified type [D64.9]  DISCHARGE DIAGNOSIS:  Active Problems:   Anemia   SECONDARY DIAGNOSIS:   Past Medical History:  Diagnosis Date  . Anemia   . COPD (chronic obstructive pulmonary disease) ([place])   . Diverticulitis   . GERD (gastroesophageal reflux disease)   . Glaucoma   . HLD (hyperlipidemia)   . Hypertension   . Iron deficiency   . Rectal bleeding      ADMITTING HISTORY  HISTORY OF PRESENT ILLNESS:  [name] [name]  is a 83 y.o. female with a known history of Chronic GI bleed. She presents back to the hospital with lower abdominal cramping pain 10 out of 10 intensity and black stools once a day. Her hemoglobin in the ER is 6.7. She states that she's been following up at the cancer center for iron infusions and transfusions. Abdominal pain eases off with a bowel movement. Nothing else makes the pain better or worse. Her stools are always dark or black. No nausea or vomiting. Patient does have a history of small bowel angiectasias. The last one was treated with cauterization on 05/10/2016.   HOSPITAL COURSE:   * Melena Patient had Small bowel enteroscopy in pursuit of bleeding source. She has had EGD/Colonoscopy and capsule endoscopies in the past. A jejunal AVM was cauterized for bleeding at that time. This admission no bleeding was found. 2 units PRBC transfused. Hb improved from 6.7 to 9.5. No further melena.  Stable for discharge after discussing with Dr. Vicente Males She has appt with [facility] - Dr. Bayard Hugger tomorrow On Rabeprazole.  CONSULTS OBTAINED:    DRUG ALLERGIES:   Allergies  Allergen Reactions  .  Codeine Nausea And Vomiting  . Dextrans Hives  . Fentanyl Itching  . Lipitor [Atorvastatin] Other (See Comments)    Reaction: Muscle pain  . Mobic [Meloxicam] Other (See Comments)    Reaction: Mouth and tongue ulcers  . Niacin And Related Itching  . Nsaids Hives  . Pravachol [Pravastatin Sodium] Other (See Comments)    Reaction: Muscle pain  . Statins   . Voltaren [Diclofenac Sodium] Other (See Comments)    Reaction: Mouth and tongue ulcers  . Zetia [Ezetimibe] Other (See Comments)    Reaction: Leg pain    DISCHARGE MEDICATIONS:   Discharge Medication List as of 05/31/2016 11:33 AM    CONTINUE these medications which have NOT CHANGED   Details  acetaminophen (TYLENOL) 650 MG CR tablet Take 650 mg by mouth every 8 (eight) hours as needed for pain., Historical Med    albuterol (PROVENTIL HFA;VENTOLIN HFA) 108 (90 Base) MCG/ACT inhaler Inhale 2 puffs into the lungs every 4 (four) hours as needed for wheezing or shortness of breath., Historical Med    chlorthalidone (HYGROTON) 25 MG tablet Take 12.5 mg by mouth daily. , Starting Tue 01/18/2016, Historical Med    COENZYME Q10 PO Take 1 tablet by mouth daily., Historical Med    colchicine 0.6 MG tablet Take 0.6 mg by mouth daily. , Starting Thu 08/28/2013, Historical Med    Cyanocobalamin (B-12) 1000 MCG CAPS Take 3,000 Units by mouth daily. , Historical Med    cycloSPORINE (  RESTASIS) 0.05 % ophthalmic emulsion Place 1 drop into both eyes 2 (two) times daily., Historical Med    fluticasone (FLOVENT HFA) 110 MCG/ACT inhaler Inhale 1 puff into the lungs 2 (two) times daily. , Starting Wed 01/12/2016, Until Wed 10/18/2016, Historical Med    gabapentin (NEURONTIN) 300 MG capsule Take 300 mg by mouth 2 (two) times daily., Historical Med    guaiFENesin (MUCINEX) 600 MG 12 hr tablet Take 600 mg by mouth 2 (two) times daily as needed., Historical Med    hydrocortisone (PROCTOSOL HC) 2.5 % rectal cream PLACE RECTALLY TWICE A DAY, Historical  Med    latanoprost (XALATAN) 0.005 % ophthalmic solution Apply 1 drop to eye at bedtime. , Starting Mon 01/17/2016, Until Tue 01/16/2017, Historical Med    loratadine (CLARITIN) 10 MG tablet Take 10 mg by mouth daily as needed for allergies., Historical Med    losartan (COZAAR) 50 MG tablet Take 50 mg by mouth daily., Historical Med    mometasone (ELOCON) 0.1 % lotion Apply topically., Starting Mon 05/22/2016, Until Tue 05/22/2017, Historical Med    Multiple Vitamins-Minerals (OCUVITE PRESERVISION PO) Take 1 tablet by mouth 2 (two) times daily., Historical Med    PARoxetine (PAXIL) 40 MG tablet Take 40 mg by mouth every morning., Historical Med    Polyethyl Glycol-Propyl Glycol (SYSTANE) 0.4-0.3 % SOLN Apply 1 drop to eye as needed., Historical Med    potassium chloride (K-DUR,KLOR-CON) 10 MEQ tablet Take 10 mEq by mouth 3 (three) times daily. , Historical Med    RABEprazole (ACIPHEX) 20 MG tablet Take 20 mg by mouth 2 (two) times daily., Historical Med    sucralfate (CARAFATE) 1 g tablet Take 1 tablet (1 g total) by mouth 4 (four) times daily -  with meals and at bedtime., Starting Fri 08/13/2015, Normal    Tetrahydroz-Dextran-PEG-Povid (EYE DROPS ADVANCED RELIEF OP) Apply to eye., Historical Med    timolol (TIMOPTIC-XR) 0.5 % ophthalmic gel-forming Place 1 drop into both eyes daily., Historical Med    valACYclovir (VALTREX) 1000 MG tablet once daily as needed. Reported on 03/22/2015, Historical Med        Today   VITAL SIGNS:  Blood pressure (!) 163/65, pulse 81, temperature 98 F (36.7 C), temperature source Oral, resp. rate 20, height 5\' 9"  (1.753 m), weight 83.9 kg (185 lb), SpO2 97 %.  I/O:   Intake/Output Summary (Last 24 hours) at 05/31/16 1330 Last data filed at 05/31/16 1100  Gross per 24 hour  Intake             1215 ml  Output             2500 ml  Net            -1285 ml    PHYSICAL EXAMINATION:  Physical Exam  GENERAL:  83 y.o.-year-old patient lying in the bed  with no acute distress.  LUNGS: Normal breath sounds bilaterally, no wheezing, rales,rhonchi or crepitation. No use of accessory muscles of respiration.  CARDIOVASCULAR: S1, S2 normal. No murmurs, rubs, or gallops.  ABDOMEN: Soft, non-tender, non-distended. Bowel sounds present. No organomegaly or mass.  NEUROLOGIC: Moves all 4 extremities. PSYCHIATRIC: The patient is alert and oriented x 3.  SKIN: No obvious rash, lesion, or ulcer.   DATA REVIEW:   CBC  Recent Labs Lab 05/31/16 0545  WBC 5.4  HGB 9.6*  HCT 28.9*  PLT 274    Chemistries   Recent Labs Lab 05/30/16 1018 05/31/16 0545  NA 137 139  K 3.6 3.1*  CL 104 104  CO2 26 29  GLUCOSE 103* 95  BUN 37* 25*  CREATININE 0.62 0.64  CALCIUM 8.5* 8.5*  AST 27  --   ALT 15  --   ALKPHOS 54  --   BILITOT 0.5  --     Cardiac Enzymes No results for input(s): TROPONINI in the last 168 hours.  Microbiology Results  Results for orders placed or performed during the hospital encounter of 03/31/16  MRSA PCR Screening     Status: None   Collection Time: 03/31/16 10:05 PM  Result Value Ref Range Status   MRSA by PCR NEGATIVE NEGATIVE Final    Comment:        The GeneXpert MRSA Assay (FDA approved for NASAL specimens only), is one component of a comprehensive MRSA colonization surveillance program. It is not intended to diagnose MRSA infection nor to guide or monitor treatment for MRSA infections.     RADIOLOGY:  No results found.  Follow up with PCP in 1 week.  Management plans discussed with the patient, family and they are in agreement.  CODE STATUS:     Code Status Orders        Start     Ordered   05/30/16 1240  Do not attempt resuscitation (DNR)  Continuous    Question Answer Comment  In the event of cardiac or respiratory ARREST Do not call a "code blue"   In the event of cardiac or respiratory ARREST Do not perform Intubation, CPR, defibrillation or ACLS   In the event of cardiac or  respiratory ARREST Use medication by any route, position, wound care, and other measures to relive pain and suffering. May use oxygen, suction and manual treatment of airway obstruction as needed for comfort.   Comments nurse may pronounce      05/30/16 1239    Code Status History    Date Active Date Inactive Code Status Order ID Comments User Context   05/09/2016  4:15 PM 05/12/2016  7:04 PM Full Code [id number]  Demetrios Loll, MD Inpatient   04/12/2016  5:16 PM 04/15/2016  4:10 PM Full Code [id number]  Bettey Costa, MD Inpatient   03/31/2016 10:57 PM 04/04/2016  5:41 PM Partial Code [id number]  Harrie Foreman, MD Inpatient   03/31/2016  7:25 PM 03/31/2016 10:57 PM Full Code [id number]  Loletha Grayer, MD ED   10/10/2015 10:32 PM 10/13/2015  9:47 PM Full Code [id number]  Harvie Bridge, DO ED   08/11/2015  1:41 AM 08/13/2015  6:58 PM Full Code [id number]  Lance Coon, MD Inpatient    Advance Directive Documentation     Most Recent Value  Type of Advance Directive  Out of facility DNR (pink MOST or yellow form)  Pre-existing out of facility DNR order (yellow form or pink MOST form)  Pink MOST form placed in chart (order not valid for inpatient use)  "MOST" Form in Place?  -      TOTAL TIME TAKING CARE OF THIS PATIENT ON DAY OF DISCHARGE: more than 30 minutes.   Hillary Bow R M.D on 05/31/2016 at 1:30 PM  Between 7am to 6pm - Pager - [phone]  After 6pm go to [email or web address] - password [email or web address] [facility]  Office  [phone]  CC: Primary care physician; Gearldine Shown, DO  Note: This dictation was prepared with Dragon dictation along with smaller phrase technology. Any transcriptional errors that result from this process are unintentional.

## 2016-05-31 NOTE — Progress Notes (Signed)
Patient alert and oriented. No complaints. Discharge instructions provided with patient verbalizing understanding. Patient discharged to home via wheel chair by [employer]. ELQ

## 2016-05-31 NOTE — Discharge Instructions (Addendum)
° °  Gastrointestinal Bleeding Gastrointestinal bleeding is bleeding somewhere along the path food travels through the body (digestive tract). This path is anywhere between the mouth and the opening of the butt (anus). You may have blood in your poop (stools) or have black poop. If you throw up (vomit), there may be blood in it. This condition can be mild, serious, or even life-threatening. If you have a lot of bleeding, you may need to stay in the hospital. Follow these instructions at home:  Take over-the-counter and prescription medicines only as told by your doctor.  Eat foods that have a lot of fiber in them. These foods include whole grains, fruits, and vegetables. You can also try eating 1-3 prunes each day.  Drink enough fluid to keep your pee (urine) clear or pale yellow.  Keep all follow-up visits as told by your doctor. This is important. Contact a doctor if:  Your symptoms do not get better. Get help right away if:  Your bleeding gets worse.  You feel dizzy or you pass out (faint).  You feel weak.  You have very bad cramps in your back or belly (abdomen).  You pass large clumps of blood (clots) in your poop.  Your symptoms are getting worse. This information is not intended to replace advice given to you by your health care provider. Make sure you discuss any questions you have with your health care provider. Document Released: 10/05/2007 Document Revised: 06/03/2015 Document Reviewed: 06/15/2014 Elsevier Interactive Patient Education  2017 [place].  Please follow up with Dr. Bayard Hugger as previously scheduled.  Return to ED here or at [facility] if any further bleeding

## 2016-06-01 ENCOUNTER — Encounter: Payer: Self-pay | Admitting: Gastroenterology

## 2016-06-01 NOTE — Telephone Encounter (Signed)
Pt has been advised.

## 2016-06-02 DIAGNOSIS — K5521 Angiodysplasia of colon with hemorrhage: Secondary | ICD-10-CM | POA: Insufficient documentation

## 2016-06-08 ENCOUNTER — Other Ambulatory Visit: Payer: Self-pay

## 2016-06-23 ENCOUNTER — Inpatient Hospital Stay: Payer: Medicare Other

## 2016-07-14 ENCOUNTER — Other Ambulatory Visit: Payer: Self-pay | Admitting: *Deleted

## 2016-07-14 ENCOUNTER — Other Ambulatory Visit: Payer: Self-pay | Admitting: Oncology

## 2016-07-14 ENCOUNTER — Telehealth: Payer: Self-pay | Admitting: *Deleted

## 2016-07-14 DIAGNOSIS — D500 Iron deficiency anemia secondary to blood loss (chronic): Secondary | ICD-10-CM

## 2016-07-14 NOTE — Telephone Encounter (Signed)
Called patient back regarding her hgb 7.0 that was drawn at [facility] clinic.  Patient denies any symptoms other than her sob that has been present. Patient denies bleeding at this time.  Patient instructed to come to cancer center Monday for blood transfusion at 0930 voiced understanding, pt instructed that if she noticed any bleeding or if symptoms worsened to go to the ED this weekend for evaluation, voiced understanding.

## 2016-07-17 ENCOUNTER — Inpatient Hospital Stay: Payer: Medicare Other | Attending: Oncology | Admitting: *Deleted

## 2016-07-17 ENCOUNTER — Inpatient Hospital Stay: Payer: Medicare Other

## 2016-07-17 ENCOUNTER — Other Ambulatory Visit: Payer: Self-pay | Admitting: *Deleted

## 2016-07-17 ENCOUNTER — Other Ambulatory Visit: Payer: Self-pay | Admitting: Oncology

## 2016-07-17 VITALS — BP 130/69 | HR 78 | Temp 98.0°F | Resp 20

## 2016-07-17 DIAGNOSIS — I10 Essential (primary) hypertension: Secondary | ICD-10-CM | POA: Diagnosis not present

## 2016-07-17 DIAGNOSIS — R5383 Other fatigue: Secondary | ICD-10-CM | POA: Diagnosis not present

## 2016-07-17 DIAGNOSIS — D500 Iron deficiency anemia secondary to blood loss (chronic): Secondary | ICD-10-CM | POA: Diagnosis not present

## 2016-07-17 DIAGNOSIS — Z8719 Personal history of other diseases of the digestive system: Secondary | ICD-10-CM | POA: Diagnosis not present

## 2016-07-17 DIAGNOSIS — K219 Gastro-esophageal reflux disease without esophagitis: Secondary | ICD-10-CM | POA: Diagnosis not present

## 2016-07-17 DIAGNOSIS — Z87891 Personal history of nicotine dependence: Secondary | ICD-10-CM | POA: Insufficient documentation

## 2016-07-17 DIAGNOSIS — J449 Chronic obstructive pulmonary disease, unspecified: Secondary | ICD-10-CM | POA: Insufficient documentation

## 2016-07-17 DIAGNOSIS — E785 Hyperlipidemia, unspecified: Secondary | ICD-10-CM | POA: Insufficient documentation

## 2016-07-17 DIAGNOSIS — Z79899 Other long term (current) drug therapy: Secondary | ICD-10-CM | POA: Insufficient documentation

## 2016-07-17 LAB — CBC
HEMATOCRIT: 22.2 % — AB (ref 35.0–47.0)
HEMOGLOBIN: 7.3 g/dL — AB (ref 12.0–16.0)
MCH: 23.8 pg — ABNORMAL LOW (ref 26.0–34.0)
MCHC: 32.8 g/dL (ref 32.0–36.0)
MCV: 72.6 fL — AB (ref 80.0–100.0)
Platelets: 402 10*3/uL (ref 150–440)
RBC: 3.05 MIL/uL — ABNORMAL LOW (ref 3.80–5.20)
RDW: 18.7 % — ABNORMAL HIGH (ref 11.5–14.5)
WBC: 5.9 10*3/uL (ref 3.6–11.0)

## 2016-07-17 LAB — IRON AND TIBC
Iron: 8 ug/dL — ABNORMAL LOW (ref 28–170)
Saturation Ratios: 2 % — ABNORMAL LOW (ref 10.4–31.8)
TIBC: 426 ug/dL (ref 250–450)
UIBC: 418 ug/dL

## 2016-07-17 LAB — PREPARE RBC (CROSSMATCH)

## 2016-07-17 LAB — FERRITIN: Ferritin: 7 ng/mL — ABNORMAL LOW (ref 11–307)

## 2016-07-17 MED ORDER — SODIUM CHLORIDE 0.9 % IV SOLN: 200.0000 mg | Freq: Once | INTRAVENOUS | Status: DC

## 2016-07-17 MED ORDER — SODIUM CHLORIDE 0.9 % IV SOLN
250.0000 mL | Freq: Once | INTRAVENOUS | Status: AC
Start: 2016-07-17 — End: 2016-07-17
  Administered 2016-07-17: 250 mL via INTRAVENOUS
  Filled 2016-07-17: qty 250

## 2016-07-17 MED ORDER — IRON SUCROSE 20 MG/ML IV SOLN
200.0000 mg | Freq: Once | INTRAVENOUS | Status: AC
  Administered 2016-07-17: 200 mg via INTRAVENOUS
  Filled 2016-07-17: qty 10

## 2016-07-17 NOTE — Progress Notes (Signed)
Please let patient know that she can get 1 dose of feraheme this week and next week on 7/17 when she sees me. She got 1 unit of blood today

## 2016-07-18 ENCOUNTER — Telehealth: Payer: Self-pay

## 2016-07-18 LAB — TYPE AND SCREEN
ABO/RH(D): O NEG
ANTIBODY SCREEN: POSITIVE
Donor AG Type: NEGATIVE
Unit division: 0

## 2016-07-18 LAB — BPAM RBC
Blood Product Expiration Date: 201807192359
ISSUE DATE / TIME: 201807091427
Unit Type and Rh: 9500

## 2016-07-18 NOTE — Telephone Encounter (Signed)
Spoke to patient to address Dr. Georgeann Oppenheim concerns:  Plan  1. Check CBC in 10-14 days - lets see how she does with the IV iron:  Patient having labs prior to 7/17 appt with Dr. Janese Banks. 2. Stop oral iron , no nsaids, continue PPI: Pt states not taking NSAIDS or iron 3. Inform patient to call me if stools are black: Advised 4. If further drop in Hb likely will need a repeat capsule study

## 2016-07-24 ENCOUNTER — Other Ambulatory Visit: Payer: Self-pay

## 2016-07-24 DIAGNOSIS — D500 Iron deficiency anemia secondary to blood loss (chronic): Secondary | ICD-10-CM

## 2016-07-25 ENCOUNTER — Inpatient Hospital Stay: Payer: Medicare Other

## 2016-07-25 ENCOUNTER — Inpatient Hospital Stay (HOSPITAL_BASED_OUTPATIENT_CLINIC_OR_DEPARTMENT_OTHER): Payer: Medicare Other | Admitting: Oncology

## 2016-07-25 VITALS — BP 127/68 | HR 76 | Temp 98.8°F | Resp 18 | Wt 184.6 lb

## 2016-07-25 DIAGNOSIS — D500 Iron deficiency anemia secondary to blood loss (chronic): Secondary | ICD-10-CM

## 2016-07-25 DIAGNOSIS — R5383 Other fatigue: Secondary | ICD-10-CM | POA: Diagnosis not present

## 2016-07-25 DIAGNOSIS — Z8719 Personal history of other diseases of the digestive system: Secondary | ICD-10-CM

## 2016-07-25 DIAGNOSIS — E785 Hyperlipidemia, unspecified: Secondary | ICD-10-CM

## 2016-07-25 DIAGNOSIS — Z79899 Other long term (current) drug therapy: Secondary | ICD-10-CM | POA: Diagnosis not present

## 2016-07-25 DIAGNOSIS — I10 Essential (primary) hypertension: Secondary | ICD-10-CM

## 2016-07-25 DIAGNOSIS — K219 Gastro-esophageal reflux disease without esophagitis: Secondary | ICD-10-CM

## 2016-07-25 DIAGNOSIS — Z87891 Personal history of nicotine dependence: Secondary | ICD-10-CM

## 2016-07-25 DIAGNOSIS — J449 Chronic obstructive pulmonary disease, unspecified: Secondary | ICD-10-CM | POA: Diagnosis not present

## 2016-07-25 LAB — CBC
HEMATOCRIT: 26.2 % — AB (ref 35.0–47.0)
Hemoglobin: 8.5 g/dL — ABNORMAL LOW (ref 12.0–16.0)
MCH: 24 pg — AB (ref 26.0–34.0)
MCHC: 32.5 g/dL (ref 32.0–36.0)
MCV: 73.9 fL — AB (ref 80.0–100.0)
PLATELETS: 319 10*3/uL (ref 150–440)
RBC: 3.54 MIL/uL — AB (ref 3.80–5.20)
RDW: 21.1 % — ABNORMAL HIGH (ref 11.5–14.5)
WBC: 4.7 10*3/uL (ref 3.6–11.0)

## 2016-07-25 LAB — IRON AND TIBC
Iron: 18 ug/dL — ABNORMAL LOW (ref 28–170)
SATURATION RATIOS: 4 % — AB (ref 10.4–31.8)
TIBC: 405 ug/dL (ref 250–450)
UIBC: 387 ug/dL

## 2016-07-25 LAB — FERRITIN: Ferritin: 46 ng/mL (ref 11–307)

## 2016-07-25 NOTE — Progress Notes (Signed)
Here for follow up. Stated has had recent GI bleeds w procedure at [facility]  3 wks ago w Dr Jill Alexanders  Procedure- endoscopy w leisions  Corrected -all info per pt.  Black stools at 2 days and none since .has f/u appt next month at [place] per pt

## 2016-07-26 ENCOUNTER — Inpatient Hospital Stay: Payer: Medicare Other

## 2016-07-26 VITALS — BP 146/62 | HR 78 | Temp 98.0°F | Resp 20

## 2016-07-26 DIAGNOSIS — D500 Iron deficiency anemia secondary to blood loss (chronic): Secondary | ICD-10-CM

## 2016-07-26 MED ORDER — SODIUM CHLORIDE 0.9 % IV SOLN
510.0000 mg | Freq: Once | INTRAVENOUS | Status: AC
  Administered 2016-07-26: 510 mg via INTRAVENOUS
  Filled 2016-07-26: qty 17

## 2016-07-26 MED ORDER — SODIUM CHLORIDE 0.9 % IV SOLN
Freq: Once | INTRAVENOUS | Status: AC
  Administered 2016-07-26: 11:00:00 via INTRAVENOUS
  Filled 2016-07-26: qty 1000

## 2016-07-27 ENCOUNTER — Encounter: Payer: Self-pay | Admitting: Oncology

## 2016-07-27 NOTE — Progress Notes (Signed)
Hematology/Oncology Consult note [facility]  Telephone:([phone] Fax:([phone]  Patient Care Team: Gearldine Shown, DO as PCP - General (Family Medicine)   Name of the patient: [name] [name]  [id number]  Dec 14, 1925   Date of visit: 07/27/16  Diagnosis- iron deficiency anemia  Chief complaint/ Reason for visit- routine f/u  Heme/Onc history: Patient is a 83 year old female who was last seen by Dr. Sherrine Maples in October 2017. Patient has a history of chronic iron deficiency anemia over several years. She has had EGD and CT scans at [facility] which showed diverticulosis. She cannot undergo colonoscopy at this time and she tolerates oral iron poorly due to stomach cramps and diarrhea. She has received IV iron infusions at [facility] as well as you and has tolerated it well without any significant side effects. She does become anemic from time to time and has even required blood transfusions in the past. She received 4 doses of IV Venofer starting October 2017. At this point she is getting IV iron and blood transfusion on a palliative basis to keep her hemoglobin close to 10  Patient presented with melena in March 2018. Underwent EGD and colonoscopy which did not reveal any bleeding. Recent hb on 05/12/16 showed hb of 7.5. Capsule study showed active bleeding from single spot in proximal jejunum. Underwent push enteroscopy with argon laser therapy. No bleeding was found. Last dose of feraheme was on 03/30/16  She underwent enteroscopy at [facility] on 07/04/16. I do not have reports of those but patient states that bleeding site was found and "was taken care of"  Interval history- feels fatigued. Denies other complaints. She has not seen any blood in stools or dark stools. She did have it for few days post procedure and then stopped  ECOG PS- 2 Pain scale- 0   Review of systems- Review of Systems  Constitutional: Negative for chills,  fever, malaise/fatigue and weight loss.  HENT: Negative for congestion, ear discharge and nosebleeds.   Eyes: Negative for blurred vision.  Respiratory: Negative for cough, hemoptysis, sputum production, shortness of breath and wheezing.   Cardiovascular: Negative for chest pain, palpitations, orthopnea and claudication.  Gastrointestinal: Negative for abdominal pain, blood in stool, constipation, diarrhea, heartburn, melena, nausea and vomiting.  Genitourinary: Negative for dysuria, flank pain, frequency, hematuria and urgency.  Musculoskeletal: Negative for back pain, joint pain and myalgias.  Skin: Negative for rash.  Neurological: Negative for dizziness, tingling, focal weakness, seizures, weakness and headaches.  Endo/Heme/Allergies: Does not bruise/bleed easily.  Psychiatric/Behavioral: Negative for depression and suicidal ideas. The patient does not have insomnia.        Allergies  Allergen Reactions  . Codeine Nausea And Vomiting  . Dextrans Hives  . Fentanyl Itching  . Lipitor [Atorvastatin] Other (See Comments)    Reaction: Muscle pain  . Lovastatin   . Mobic [Meloxicam] Other (See Comments)    Reaction: Mouth and tongue ulcers  . Niacin And Related Itching  . Nsaids Hives  . Other   . Pravachol [Pravastatin Sodium] Other (See Comments)    Reaction: Muscle pain  . Statins   . Tolmetin Hives  . Voltaren [Diclofenac Sodium] Other (See Comments)    Reaction: Mouth and tongue ulcers  . Zetia [Ezetimibe] Other (See Comments)    Reaction: Leg pain  . Niacin Itching     Past Medical History:  Diagnosis Date  . Anemia   . COPD (chronic obstructive pulmonary disease) ([place])   .  Diverticulitis   . GERD (gastroesophageal reflux disease)   . Glaucoma   . HLD (hyperlipidemia)   . Hypertension   . Iron deficiency   . Rectal bleeding      Past Surgical History:  Procedure Laterality Date  . ABDOMINAL HYSTERECTOMY    . APPENDECTOMY    . COLONOSCOPY WITH PROPOFOL  N/A 04/14/2016   Procedure: COLONOSCOPY WITH PROPOFOL;  Surgeon: Lucilla Lame, MD;  Location: [facility] ENDOSCOPY;  Service: Endoscopy;  Laterality: N/A;  . ENTEROSCOPY N/A 05/10/2016   Procedure: Push ENTEROSCOPY with pediatric colonoscope;  Surgeon: [name], MD;  Location: [facility] ENDOSCOPY;  Service: Endoscopy;  Laterality: N/A;  . ENTEROSCOPY N/A 05/30/2016   Procedure: push ENTEROSCOPY;  Surgeon: [name], MD;  Location: [facility] ENDOSCOPY;  Service: Endoscopy;  Laterality: N/A;  . ESOPHAGOGASTRODUODENOSCOPY (EGD) WITH PROPOFOL N/A 08/12/2015   Procedure: ESOPHAGOGASTRODUODENOSCOPY (EGD) WITH PROPOFOL;  Surgeon: Lollie Sails, MD;  Location: [facility] ENDOSCOPY;  Service: Endoscopy;  Laterality: N/A;  . ESOPHAGOGASTRODUODENOSCOPY (EGD) WITH PROPOFOL N/A 04/03/2016   Procedure: ESOPHAGOGASTRODUODENOSCOPY (EGD) WITH PROPOFOL;  Surgeon: Lucilla Lame, MD;  Location: [facility] ENDOSCOPY;  Service: Endoscopy;  Laterality: N/A;  . GIVENS CAPSULE STUDY N/A 04/28/2016   Procedure: GIVENS CAPSULE STUDY;  Surgeon: [name], MD;  Location: [facility] ENDOSCOPY;  Service: Endoscopy;  Laterality: N/A;  . JOINT REPLACEMENT      Social History   Social History  . Marital status: Widowed    Spouse name: N/A  . Number of children: N/A  . Years of education: N/A   Occupational History  . Not on file.   Social History Main Topics  . Smoking status: Former Smoker    Types: Cigarettes  . Smokeless tobacco: Never Used  . Alcohol use No  . Drug use: No  . Sexual activity: Not on file   Other Topics Concern  . Not on file   Social History Narrative  . No narrative on file    Family History  Problem Relation Age of Onset  . CAD Mother   . CAD Father   . CAD Brother      Current Outpatient Prescriptions:  .  albuterol (PROVENTIL HFA;VENTOLIN HFA) 108 (90 Base) MCG/ACT inhaler, Inhale 2 puffs into the lungs every 4 (four) hours as needed for wheezing or shortness of breath., Disp: , Rfl:  .  chlorthalidone  (HYGROTON) 25 MG tablet, Take 12.5 mg by mouth daily. , Disp: , Rfl:  .  COENZYME Q10 PO, Take 1 tablet by mouth daily., Disp: , Rfl:  .  Cyanocobalamin (B-12) 1000 MCG CAPS, Take 3,000 Units by mouth daily. , Disp: , Rfl:  .  cycloSPORINE (RESTASIS) 0.05 % ophthalmic emulsion, Place 1 drop into both eyes 2 (two) times daily., Disp: , Rfl:  .  fluticasone (FLOVENT HFA) 110 MCG/ACT inhaler, Inhale 1 puff into the lungs 2 (two) times daily. , Disp: , Rfl:  .  gabapentin (NEURONTIN) 300 MG capsule, Take 300 mg by mouth 2 (two) times daily., Disp: , Rfl:  .  latanoprost (XALATAN) 0.005 % ophthalmic solution, Apply 1 drop to eye at bedtime. , Disp: , Rfl:  .  losartan (COZAAR) 50 MG tablet, Take 50 mg by mouth daily., Disp: , Rfl:  .  Multiple Vitamins-Minerals (OCUVITE PRESERVISION PO), Take 1 tablet by mouth 2 (two) times daily., Disp: , Rfl:  .  PARoxetine (PAXIL) 40 MG tablet, Take 40 mg by mouth every morning., Disp: , Rfl:  .  Polyethyl Glycol-Propyl Glycol (SYSTANE) 0.4-0.3 %  SOLN, Apply 1 drop to eye as needed., Disp: , Rfl:  .  potassium chloride (K-DUR,KLOR-CON) 10 MEQ tablet, Take 10 mEq by mouth 3 (three) times daily. , Disp: , Rfl:  .  RABEprazole (ACIPHEX) 20 MG tablet, Take 20 mg by mouth 2 (two) times daily., Disp: , Rfl:  .  Tetrahydroz-Dextran-PEG-Povid (EYE DROPS ADVANCED RELIEF OP), Apply to eye., Disp: , Rfl:  .  timolol (TIMOPTIC-XR) 0.5 % ophthalmic gel-forming, Place 1 drop into both eyes daily., Disp: , Rfl:  .  valACYclovir (VALTREX) 1000 MG tablet, once daily as needed. Reported on 03/22/2015, Disp: , Rfl:  .  acetaminophen (TYLENOL) 650 MG CR tablet, Take 650 mg by mouth every 8 (eight) hours as needed for pain., Disp: , Rfl:  .  colchicine 0.6 MG tablet, Take 0.6 mg by mouth daily. , Disp: , Rfl:  .  guaiFENesin (MUCINEX) 600 MG 12 hr tablet, Take 600 mg by mouth 2 (two) times daily as needed., Disp: , Rfl:  .  hydrocortisone (PROCTOSOL HC) 2.5 % rectal cream, PLACE RECTALLY  TWICE A DAY, Disp: , Rfl:  .  loratadine (CLARITIN) 10 MG tablet, Take 10 mg by mouth daily as needed for allergies., Disp: , Rfl:  .  menthol-zinc oxide (GOLD BOND) powder, Apply topically once daily as needed (PRN under breasts for yeast)., Disp: , Rfl:  .  mometasone (ELOCON) 0.1 % lotion, Apply topically., Disp: , Rfl:  .  sucralfate (CARAFATE) 1 g tablet, Take 1 tablet (1 g total) by mouth 4 (four) times daily -  with meals and at bedtime. (Patient not taking: Reported on 07/25/2016), Disp: 120 tablet, Rfl: 1  Physical exam:  Vitals:   07/25/16 1124  BP: 127/68  Pulse: 76  Resp: 18  Temp: 98.8 F (37.1 C)  TempSrc: Tympanic  Weight: 184 lb 9.6 oz (83.7 kg)   Physical Exam  Constitutional: She is oriented to person, place, and time.  Elderly frail woman sitting in a wheelchair  HENT:  Head: Normocephalic and atraumatic.  Eyes: Pupils are equal, round, and reactive to light. EOM are normal.  Neck: Normal range of motion.  Cardiovascular: Normal rate, regular rhythm and normal heart sounds.   Pulmonary/Chest: Effort normal and breath sounds normal.  Abdominal: Soft. Bowel sounds are normal.  Neurological: She is alert and oriented to person, place, and time.  Skin: Skin is warm and dry.     CMP Latest Ref Rng & Units 05/31/2016  Glucose 65 - 99 mg/dL 95  BUN 6 - 20 mg/dL 25(H)  Creatinine 0.44 - 1.00 mg/dL 0.64  Sodium 135 - 145 mmol/L 139  Potassium 3.5 - 5.1 mmol/L 3.1(L)  Chloride 101 - 111 mmol/L 104  CO2 22 - 32 mmol/L 29  Calcium 8.9 - 10.3 mg/dL 8.5(L)  Total Protein 6.5 - 8.1 g/dL -  Total Bilirubin 0.3 - 1.2 mg/dL -  Alkaline Phos 38 - 126 U/L -  AST 15 - 41 U/L -  ALT 14 - 54 U/L -   CBC Latest Ref Rng & Units 07/25/2016  WBC 3.6 - 11.0 K/uL 4.7  Hemoglobin 12.0 - 16.0 g/dL 8.5(L)  Hematocrit 35.0 - 47.0 % 26.2(L)  Platelets 150 - 440 K/uL 319      Assessment and plan- Patient is a 83 y.o. 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 Will receive feraheme this week. Repeat cbc and iron studies in  2 weeks and 1 month and she will be seen by NP jennifer Burns in 1 month. I will see her back in 2 months with cbc and iron studies. Will monitor her closely since she suddenly gets episodes of GI bleed and drops her hematocrit   Visit Diagnosis 1. Iron deficiency anemia due to chronic blood loss      Dr. Randa Evens, MD, MPH [facility] at [facility] Pager- [phone] 07/27/2016 8:46 AM

## 2016-08-08 ENCOUNTER — Inpatient Hospital Stay: Payer: Medicare Other

## 2016-08-08 DIAGNOSIS — D500 Iron deficiency anemia secondary to blood loss (chronic): Secondary | ICD-10-CM

## 2016-08-08 LAB — CBC WITH DIFFERENTIAL/PLATELET
BASOS ABS: 0.2 10*3/uL — AB (ref 0–0.1)
Basophils Relative: 3 %
Eosinophils Absolute: 0.2 10*3/uL (ref 0–0.7)
Eosinophils Relative: 3 %
HEMATOCRIT: 32.2 % — AB (ref 35.0–47.0)
Hemoglobin: 10.3 g/dL — ABNORMAL LOW (ref 12.0–16.0)
LYMPHS ABS: 0.8 10*3/uL — AB (ref 1.0–3.6)
LYMPHS PCT: 15 %
MCH: 25.1 pg — ABNORMAL LOW (ref 26.0–34.0)
MCHC: 32.1 g/dL (ref 32.0–36.0)
MCV: 78.1 fL — AB (ref 80.0–100.0)
MONO ABS: 0.6 10*3/uL (ref 0.2–0.9)
Monocytes Relative: 12 %
NEUTROS ABS: 3.4 10*3/uL (ref 1.4–6.5)
Neutrophils Relative %: 67 %
PLATELETS: 292 10*3/uL (ref 150–440)
RBC: 4.12 MIL/uL (ref 3.80–5.20)
RDW: 26 % — AB (ref 11.5–14.5)
WBC: 5.1 10*3/uL (ref 3.6–11.0)

## 2016-08-22 ENCOUNTER — Inpatient Hospital Stay: Payer: Medicare Other | Attending: Oncology

## 2016-08-22 ENCOUNTER — Inpatient Hospital Stay (HOSPITAL_BASED_OUTPATIENT_CLINIC_OR_DEPARTMENT_OTHER): Payer: Medicare Other | Admitting: Oncology

## 2016-08-22 VITALS — BP 143/67 | HR 84 | Temp 98.8°F | Resp 18 | Wt 182.0 lb

## 2016-08-22 DIAGNOSIS — E785 Hyperlipidemia, unspecified: Secondary | ICD-10-CM | POA: Insufficient documentation

## 2016-08-22 DIAGNOSIS — I10 Essential (primary) hypertension: Secondary | ICD-10-CM | POA: Insufficient documentation

## 2016-08-22 DIAGNOSIS — J00 Acute nasopharyngitis [common cold]: Secondary | ICD-10-CM | POA: Insufficient documentation

## 2016-08-22 DIAGNOSIS — Z79899 Other long term (current) drug therapy: Secondary | ICD-10-CM | POA: Diagnosis not present

## 2016-08-22 DIAGNOSIS — J449 Chronic obstructive pulmonary disease, unspecified: Secondary | ICD-10-CM

## 2016-08-22 DIAGNOSIS — K5790 Diverticulosis of intestine, part unspecified, without perforation or abscess without bleeding: Secondary | ICD-10-CM

## 2016-08-22 DIAGNOSIS — D500 Iron deficiency anemia secondary to blood loss (chronic): Secondary | ICD-10-CM

## 2016-08-22 DIAGNOSIS — Z87891 Personal history of nicotine dependence: Secondary | ICD-10-CM | POA: Diagnosis not present

## 2016-08-22 DIAGNOSIS — K219 Gastro-esophageal reflux disease without esophagitis: Secondary | ICD-10-CM

## 2016-08-22 LAB — CBC WITH DIFFERENTIAL/PLATELET
BASOS ABS: 0 10*3/uL (ref 0–0.1)
Basophils Relative: 0 %
Eosinophils Absolute: 0.2 10*3/uL (ref 0–0.7)
Eosinophils Relative: 4 %
HEMATOCRIT: 34.3 % — AB (ref 35.0–47.0)
Hemoglobin: 11.2 g/dL — ABNORMAL LOW (ref 12.0–16.0)
LYMPHS PCT: 14 %
Lymphs Abs: 0.7 10*3/uL — ABNORMAL LOW (ref 1.0–3.6)
MCH: 25.2 pg — ABNORMAL LOW (ref 26.0–34.0)
MCHC: 32.7 g/dL (ref 32.0–36.0)
MCV: 77.2 fL — AB (ref 80.0–100.0)
Monocytes Absolute: 0.7 10*3/uL (ref 0.2–0.9)
Monocytes Relative: 13 %
NEUTROS ABS: 3.6 10*3/uL (ref 1.4–6.5)
NEUTROS PCT: 69 %
PLATELETS: 324 10*3/uL (ref 150–440)
RBC: 4.44 MIL/uL (ref 3.80–5.20)
RDW: 23.6 % — AB (ref 11.5–14.5)
WBC: 5.2 10*3/uL (ref 3.6–11.0)

## 2016-08-22 LAB — IRON AND TIBC
Iron: 28 ug/dL (ref 28–170)
Saturation Ratios: 7 % — ABNORMAL LOW (ref 10.4–31.8)
TIBC: 379 ug/dL (ref 250–450)
UIBC: 351 ug/dL

## 2016-08-22 LAB — FERRITIN: Ferritin: 58 ng/mL (ref 11–307)

## 2016-08-22 NOTE — Progress Notes (Signed)
Hematology/Oncology Consult note [facility]  [phone] Fax:([phone]  Patient Care Team: Gearldine Shown, DO as PCP - General (Family Medicine)   Name of the patient: [name] [name]  [id number]  01-15-1925   Date of visit: 08/22/16  Diagnosis- iron deficiency anemia  Chief complaint/ Reason for visit- routine f/u  Heme/Onc history: Patient is a 83 year old female who was last seen by Dr. Sherrine Maples in October 2017. Patient has a history of chronic iron deficiency anemia over several years. She has had EGD and CT scans at [facility] which showed diverticulosis. She cannot undergo colonoscopy at this time and she tolerates oral iron poorly due to stomach cramps and diarrhea. She has received IV iron infusions at [facility] as well as you and has tolerated it well without any significant side effects. She does become anemic from time to time and has even required blood transfusions in the past. She received 4 doses of IV Venofer starting October 2017. At this point she is getting IV iron and blood transfusion on a palliative basis to keep her hemoglobin close to 10  Patient presented with melena in March 2018. Underwent EGD and colonoscopy which did not reveal any bleeding. Recent hb on 05/12/16 showed hb of 7.5. Capsule study showed active bleeding from single spot in proximal jejunum. Underwent push enteroscopy with argon laser therapy. No bleeding was found. Last dose of feraheme was on 03/30/16  She underwent enteroscopy at [facility] by Dr. Cephas Darby on 07/04/16. No reports are available at this time.    Interval history- She feels much better then last month. She states she has "more energy". She does complain of a "head cold" that she has had for one week. She denies fevers. She denies other complaints. She has not seen any blood in stools or dark stools since her enteroscopy by Dr. Cephas Darby at the end of July.   ECOG PS-  2 Pain scale- 0   Review of systems- Review of Systems  Constitutional: Negative for chills, fever, malaise/fatigue and weight loss.  HENT: Negative for congestion, ear discharge and nosebleeds.   Eyes: Negative for blurred vision.  Respiratory: Positive for cough and shortness of breath. Negative for hemoptysis, sputum production and wheezing.   Cardiovascular: Negative for chest pain, palpitations, orthopnea and claudication.  Gastrointestinal: Negative for abdominal pain, blood in stool, constipation, diarrhea, heartburn, melena, nausea and vomiting.  Genitourinary: Negative for dysuria, flank pain, frequency, hematuria and urgency.  Musculoskeletal: Negative for back pain, joint pain and myalgias.  Skin: Negative for rash.  Neurological: Negative for dizziness, tingling, focal weakness, seizures, weakness and headaches.  Endo/Heme/Allergies: Does not bruise/bleed easily.  Psychiatric/Behavioral: Negative for depression and suicidal ideas. The patient does not have insomnia.        Allergies  Allergen Reactions  . Codeine Nausea And Vomiting  . Dextrans Hives  . Fentanyl Itching  . Lipitor [Atorvastatin] Other (See Comments)    Reaction: Muscle pain  . Lovastatin   . Mobic [Meloxicam] Other (See Comments)    Reaction: Mouth and tongue ulcers  . Niacin And Related Itching  . Nsaids Hives  . Other   . Pravachol [Pravastatin Sodium] Other (See Comments)    Reaction: Muscle pain  . Statins   . Tolmetin Hives  . Voltaren [Diclofenac Sodium] Other (See Comments)    Reaction: Mouth and tongue ulcers  . Zetia [Ezetimibe] Other (See Comments)    Reaction: Leg pain  . Niacin Itching  Past Medical History:  Diagnosis Date  . Anemia   . COPD (chronic obstructive pulmonary disease) ([place])   . Diverticulitis   . GERD (gastroesophageal reflux disease)   . Glaucoma   . HLD (hyperlipidemia)   . Hypertension   . Iron deficiency   . Rectal bleeding      Past Surgical  History:  Procedure Laterality Date  . ABDOMINAL HYSTERECTOMY    . APPENDECTOMY    . COLONOSCOPY WITH PROPOFOL N/A 04/14/2016   Procedure: COLONOSCOPY WITH PROPOFOL;  Surgeon: Lucilla Lame, MD;  Location: [facility] ENDOSCOPY;  Service: Endoscopy;  Laterality: N/A;  . ENTEROSCOPY N/A 05/10/2016   Procedure: Push ENTEROSCOPY with pediatric colonoscope;  Surgeon: [name], MD;  Location: [facility] ENDOSCOPY;  Service: Endoscopy;  Laterality: N/A;  . ENTEROSCOPY N/A 05/30/2016   Procedure: push ENTEROSCOPY;  Surgeon: [name], MD;  Location[facility] ENDOSCOPY;  Service: Endoscopy;  Laterality: N/A;  . ESOPHAGOGASTRODUODENOSCOPY (EGD) WITH PROPOFOL N/A 08/12/2015   Procedure: ESOPHAGOGASTRODUODENOSCOPY (EGD) WITH PROPOFOL;  Surgeon: Lollie Sails, MD;  Location: [facility] ENDOSCOPY;  Service: Endoscopy;  Laterality: N/A;  . ESOPHAGOGASTRODUODENOSCOPY (EGD) WITH PROPOFOL N/A 04/03/2016   Procedure: ESOPHAGOGASTRODUODENOSCOPY (EGD) WITH PROPOFOL;  Surgeon: Lucilla Lame, MD;  Location: [facility] ENDOSCOPY;  Service: Endoscopy;  Laterality: N/A;  . GIVENS CAPSULE STUDY N/A 04/28/2016   Procedure: GIVENS CAPSULE STUDY;  Surgeon: [name], MD;  Location: [facility] ENDOSCOPY;  Service: Endoscopy;  Laterality: N/A;  . JOINT REPLACEMENT      Social History   Social History  . Marital status: Widowed    Spouse name: N/A  . Number of children: N/A  . Years of education: N/A   Occupational History  . Not on file.   Social History Main Topics  . Smoking status: Former Smoker    Types: Cigarettes  . Smokeless tobacco: Never Used  . Alcohol use No  . Drug use: No  . Sexual activity: Not on file   Other Topics Concern  . Not on file   Social History Narrative  . No narrative on file    Family History  Problem Relation Age of Onset  . CAD Mother   . CAD Father   . CAD Brother      Current Outpatient Prescriptions:  .  acetaminophen (TYLENOL) 650 MG CR tablet, Take 650 mg by mouth every 8 (eight) hours as needed  for pain., Disp: , Rfl:  .  albuterol (PROVENTIL HFA;VENTOLIN HFA) 108 (90 Base) MCG/ACT inhaler, Inhale 2 puffs into the lungs every 4 (four) hours as needed for wheezing or shortness of breath., Disp: , Rfl:  .  chlorthalidone (HYGROTON) 25 MG tablet, Take 12.5 mg by mouth daily. , Disp: , Rfl:  .  COENZYME Q10 PO, Take 1 tablet by mouth daily., Disp: , Rfl:  .  colchicine 0.6 MG tablet, Take 0.6 mg by mouth daily. , Disp: , Rfl:  .  Cyanocobalamin (B-12) 1000 MCG CAPS, Take 3,000 Units by mouth daily. , Disp: , Rfl:  .  cycloSPORINE (RESTASIS) 0.05 % ophthalmic emulsion, Place 1 drop into both eyes 2 (two) times daily., Disp: , Rfl:  .  fluticasone (FLOVENT HFA) 110 MCG/ACT inhaler, Inhale 1 puff into the lungs 2 (two) times daily. , Disp: , Rfl:  .  gabapentin (NEURONTIN) 300 MG capsule, Take 300 mg by mouth 2 (two) times daily., Disp: , Rfl:  .  guaiFENesin (MUCINEX) 600 MG 12 hr tablet, Take 600 mg by mouth 2 (two) times daily as needed.,  Disp: , Rfl:  .  hydrocortisone (PROCTOSOL HC) 2.5 % rectal cream, PLACE RECTALLY TWICE A DAY, Disp: , Rfl:  .  latanoprost (XALATAN) 0.005 % ophthalmic solution, Apply 1 drop to eye at bedtime. , Disp: , Rfl:  .  loratadine (CLARITIN) 10 MG tablet, Take 10 mg by mouth daily as needed for allergies., Disp: , Rfl:  .  losartan (COZAAR) 50 MG tablet, Take 50 mg by mouth daily., Disp: , Rfl:  .  menthol-zinc oxide (GOLD BOND) powder, Apply topically once daily as needed (PRN under breasts for yeast)., Disp: , Rfl:  .  mometasone (ELOCON) 0.1 % lotion, Apply topically., Disp: , Rfl:  .  Multiple Vitamins-Minerals (OCUVITE PRESERVISION PO), Take 1 tablet by mouth 2 (two) times daily., Disp: , Rfl:  .  PARoxetine (PAXIL) 40 MG tablet, Take 40 mg by mouth every morning., Disp: , Rfl:  .  Polyethyl Glycol-Propyl Glycol (SYSTANE) 0.4-0.3 % SOLN, Apply 1 drop to eye as needed., Disp: , Rfl:  .  potassium chloride (K-DUR,KLOR-CON) 10 MEQ tablet, Take 10 mEq by mouth 3  (three) times daily. , Disp: , Rfl:  .  RABEprazole (ACIPHEX) 20 MG tablet, Take 20 mg by mouth 2 (two) times daily., Disp: , Rfl:  .  sucralfate (CARAFATE) 1 g tablet, Take 1 tablet (1 g total) by mouth 4 (four) times daily -  with meals and at bedtime., Disp: 120 tablet, Rfl: 1 .  Tetrahydroz-Dextran-PEG-Povid (EYE DROPS ADVANCED RELIEF OP), Apply to eye., Disp: , Rfl:  .  timolol (TIMOPTIC-XR) 0.5 % ophthalmic gel-forming, Place 1 drop into both eyes daily., Disp: , Rfl:  .  valACYclovir (VALTREX) 1000 MG tablet, once daily as needed. Reported on 03/22/2015, Disp: , Rfl:   Physical exam:  Vitals:   08/22/16 1056  BP: (!) 143/67  Pulse: 84  Resp: 18  Temp: 98.8 F (37.1 C)  TempSrc: Tympanic  Weight: 182 lb (82.6 kg)   Physical Exam  Constitutional: She is oriented to person, place, and time.  Elderly frail woman sitting in a wheelchair  HENT:  Head: Normocephalic and atraumatic.  Eyes: Pupils are equal, round, and reactive to light. EOM are normal.  Neck: Normal range of motion.  Cardiovascular: Normal rate, regular rhythm and normal heart sounds.   Pulmonary/Chest: Effort normal and breath sounds normal.  Abdominal: Soft. Bowel sounds are normal.  Neurological: She is alert and oriented to person, place, and time.  Skin: Skin is warm and dry.     CMP Latest Ref Rng & Units 05/31/2016  Glucose 65 - 99 mg/dL 95  BUN 6 - 20 mg/dL 25(H)  Creatinine 0.44 - 1.00 mg/dL 0.64  Sodium 135 - 145 mmol/L 139  Potassium 3.5 - 5.1 mmol/L 3.1(L)  Chloride 101 - 111 mmol/L 104  CO2 22 - 32 mmol/L 29  Calcium 8.9 - 10.3 mg/dL 8.5(L)  Total Protein 6.5 - 8.1 g/dL -  Total Bilirubin 0.3 - 1.2 mg/dL -  Alkaline Phos 38 - 126 U/L -  AST 15 - 41 U/L -  ALT 14 - 54 U/L -   CBC Latest Ref Rng & Units 08/22/2016  WBC 3.6 - 11.0 K/uL 5.2  Hemoglobin 12.0 - 16.0 g/dL 11.2(L)  Hematocrit 35.0 - 47.0 % 34.3(L)  Platelets 150 - 440 K/uL 324      Assessment and plan- Patient is a 83 y.o.  female with iron deficiency anemia likely due to bleeding AVM's  Patient received 1 unit of blood and  venofer in early July 2018. Received feraheme last on 07/26/16. Today her labs look much better. Hemoglobin 11.2 and Hematocrit 34.3. Iron is 28 and Ferritin 58. Iron Saturation ratios are still low at 7. She already has a return appointment on 09/19/2016 for repeat cbc and iron studies. Will continue to monitor her closely. She currently denies any active bleeding. She is aware of signs and symptoms of low hemoglobin and iron levels and will call us with concerns.   Visit Diagnosis 1. Iron deficiency anemia due to chronic blood loss     Faythe Casa, NP 08/22/2016 3:42 PM

## 2016-09-19 ENCOUNTER — Inpatient Hospital Stay: Payer: Medicare Other | Attending: Oncology

## 2016-09-19 ENCOUNTER — Telehealth: Payer: Self-pay | Admitting: Oncology

## 2016-09-19 ENCOUNTER — Inpatient Hospital Stay (HOSPITAL_BASED_OUTPATIENT_CLINIC_OR_DEPARTMENT_OTHER): Payer: Medicare Other | Admitting: Oncology

## 2016-09-19 VITALS — BP 173/78 | HR 76 | Temp 96.2°F | Resp 18 | Wt 184.2 lb

## 2016-09-19 DIAGNOSIS — E785 Hyperlipidemia, unspecified: Secondary | ICD-10-CM | POA: Diagnosis not present

## 2016-09-19 DIAGNOSIS — D509 Iron deficiency anemia, unspecified: Secondary | ICD-10-CM | POA: Insufficient documentation

## 2016-09-19 DIAGNOSIS — D500 Iron deficiency anemia secondary to blood loss (chronic): Secondary | ICD-10-CM

## 2016-09-19 DIAGNOSIS — R05 Cough: Secondary | ICD-10-CM | POA: Diagnosis not present

## 2016-09-19 DIAGNOSIS — I10 Essential (primary) hypertension: Secondary | ICD-10-CM

## 2016-09-19 DIAGNOSIS — K921 Melena: Secondary | ICD-10-CM | POA: Diagnosis not present

## 2016-09-19 DIAGNOSIS — Z79899 Other long term (current) drug therapy: Secondary | ICD-10-CM

## 2016-09-19 DIAGNOSIS — F449 Dissociative and conversion disorder, unspecified: Secondary | ICD-10-CM | POA: Insufficient documentation

## 2016-09-19 DIAGNOSIS — Z87891 Personal history of nicotine dependence: Secondary | ICD-10-CM | POA: Insufficient documentation

## 2016-09-19 DIAGNOSIS — K219 Gastro-esophageal reflux disease without esophagitis: Secondary | ICD-10-CM

## 2016-09-19 DIAGNOSIS — Z8719 Personal history of other diseases of the digestive system: Secondary | ICD-10-CM | POA: Insufficient documentation

## 2016-09-19 LAB — IRON AND TIBC
IRON: 22 ug/dL — AB (ref 28–170)
Saturation Ratios: 6 % — ABNORMAL LOW (ref 10.4–31.8)
TIBC: 391 ug/dL (ref 250–450)
UIBC: 369 ug/dL

## 2016-09-19 LAB — CBC WITH DIFFERENTIAL/PLATELET
BASOS ABS: 0.2 10*3/uL — AB (ref 0–0.1)
BASOS PCT: 3 %
Eosinophils Absolute: 0.2 10*3/uL (ref 0–0.7)
Eosinophils Relative: 3 %
HCT: 31.3 % — ABNORMAL LOW (ref 35.0–47.0)
Hemoglobin: 10.5 g/dL — ABNORMAL LOW (ref 12.0–16.0)
Lymphocytes Relative: 15 %
Lymphs Abs: 1.1 10*3/uL (ref 1.0–3.6)
MCH: 24.8 pg — ABNORMAL LOW (ref 26.0–34.0)
MCHC: 33.6 g/dL (ref 32.0–36.0)
MCV: 73.8 fL — ABNORMAL LOW (ref 80.0–100.0)
Monocytes Absolute: 1 10*3/uL — ABNORMAL HIGH (ref 0.2–0.9)
Monocytes Relative: 15 %
NEUTROS ABS: 4.4 10*3/uL (ref 1.4–6.5)
NEUTROS PCT: 64 %
PLATELETS: 341 10*3/uL (ref 150–440)
RBC: 4.25 MIL/uL (ref 3.80–5.20)
RDW: 21.6 % — AB (ref 11.5–14.5)
WBC: 6.9 10*3/uL (ref 3.6–11.0)

## 2016-09-19 LAB — FERRITIN: Ferritin: 14 ng/mL (ref 11–307)

## 2016-09-19 NOTE — Progress Notes (Signed)
Hematology/Oncology Consult note [facility]  Telephone:([phone] [phone]  Patient Care Team: Gearldine Shown, DO as PCP - General (Family Medicine)   Name of the patient: [name] [name]  [id number]  Jun 18, 1925   Date of visit: 09/19/16  Diagnosis- iron deficiency anemia  Chief complaint/ Reason for visit- routine f/u  Heme/Onc history: Patient is a 83 year old female who was referred to the clinic in October 2017 for chronic iron deficiency anemia with history of diverticulosis. She has been unable to tolerate oral iron supplements due to abdominal cramping and diarrhea. She has required several blood transfusions in the past and received regular IV iron infusions. She has tolerated these without complications. Iron and blood transfusions have been given on a palliative basis with a goal hemoglobin of >10.   March 2018 she underwent an EGD and colonoscopy which did not reveal source of bleeding.   May  2/18 - enteroscopy revealed 3 avms which were ablated. She suffered melena and had a hemoglobin of 6.7 and was hospitalized. At that time, enteroscopy was repeated which was normal. She received a blood transfusion and her hemoglobin was 9.6.   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   08/07/2016 Capsule study showed active bleeding from single spot in proximal jejunum. She underwent push enteroscopy with argon laser therapy but no bleeding was found.   She last received feraheme 07/26/16.    Interval history- Today she reports feeling well and has 'decent energy for an 83 year old'. She denies fevers. She denies other complaints. She has not noticed any additional bloody stools.   ECOG PS- 2 Pain scale- 0   Review of systems- Review of Systems  Constitutional: Negative for chills, fever, malaise/fatigue and weight loss.  HENT: Negative for congestion, ear discharge  and nosebleeds.   Eyes: Negative for blurred vision.       Difficulty seeing/loss of central vision/macular degeneration  Respiratory: Positive for cough and sputum production. Negative for hemoptysis, shortness of breath and wheezing.   Cardiovascular: Negative for chest pain, palpitations, orthopnea and claudication.  Gastrointestinal: Negative for abdominal pain, blood in stool, constipation, diarrhea, heartburn, melena, nausea and vomiting.  Genitourinary: Negative for dysuria, flank pain, frequency, hematuria and urgency.  Musculoskeletal: Negative for back pain, joint pain and myalgias.  Skin: Negative for rash.  Neurological: Negative for dizziness, tingling, focal weakness, seizures, weakness and headaches.  Endo/Heme/Allergies: Does not bruise/bleed easily.  Psychiatric/Behavioral: Negative for depression and suicidal ideas. The patient does not have insomnia.      Allergies  Allergen Reactions  . Codeine Nausea And Vomiting  . Dextrans Hives  . Fentanyl Itching  . Lipitor [Atorvastatin] Other (See Comments)    Reaction: Muscle pain  . Lovastatin   . Mobic [Meloxicam] Other (See Comments)    Reaction: Mouth and tongue ulcers  . Niacin And Related Itching  . Nsaids Hives  . Other   . Pravachol [Pravastatin Sodium] Other (See Comments)    Reaction: Muscle pain  . Statins   . Tolmetin Hives  . Voltaren [Diclofenac Sodium] Other (See Comments)    Reaction: Mouth and tongue ulcers  . Zetia [Ezetimibe] Other (See Comments)    Reaction: Leg pain  . Niacin Itching    Past Medical History:  Diagnosis Date  . Anemia   . COPD (chronic obstructive pulmonary disease) ([place])   . Diverticulitis   . GERD (gastroesophageal reflux disease)   .  Glaucoma   . HLD (hyperlipidemia)   . Hypertension   . Iron deficiency   . Rectal bleeding      Past Surgical History:  Procedure Laterality Date  . ABDOMINAL HYSTERECTOMY    . APPENDECTOMY    . COLONOSCOPY WITH PROPOFOL N/A  04/14/2016   Procedure: COLONOSCOPY WITH PROPOFOL;  Surgeon: Lucilla Lame, MD;  Location: [facility] ENDOSCOPY;  Service: Endoscopy;  Laterality: N/A;  . ENTEROSCOPY N/A 05/10/2016   Procedure: Push ENTEROSCOPY with pediatric colonoscope;  Surgeon: [name], MD;  Location: [facility] ENDOSCOPY;  Service: Endoscopy;  Laterality: N/A;  . ENTEROSCOPY N/A 05/30/2016   Procedure: push ENTEROSCOPY;  Surgeon: [name], MD;  Location: [facility] ENDOSCOPY;  Service: Endoscopy;  Laterality: N/A;  . ESOPHAGOGASTRODUODENOSCOPY (EGD) WITH PROPOFOL N/A 08/12/2015   Procedure: ESOPHAGOGASTRODUODENOSCOPY (EGD) WITH PROPOFOL;  Surgeon: Lollie Sails, MD;  Location: [facility] ENDOSCOPY;  Service: Endoscopy;  Laterality: N/A;  . ESOPHAGOGASTRODUODENOSCOPY (EGD) WITH PROPOFOL N/A 04/03/2016   Procedure: ESOPHAGOGASTRODUODENOSCOPY (EGD) WITH PROPOFOL;  Surgeon: Lucilla Lame, MD;  Location: [facility] ENDOSCOPY;  Service: Endoscopy;  Laterality: N/A;  . GIVENS CAPSULE STUDY N/A 04/28/2016   Procedure: GIVENS CAPSULE STUDY;  Surgeon: [name], MD;  Location: [facility] ENDOSCOPY;  Service: Endoscopy;  Laterality: N/A;  . JOINT REPLACEMENT      Social History   Social History  . Marital status: Widowed    Spouse name: N/A  . Number of children: N/A  . Years of education: N/A   Occupational History  . Not on file.   Social History Main Topics  . Smoking status: Former Smoker    Types: Cigarettes  . Smokeless tobacco: Never Used  . Alcohol use No  . Drug use: No  . Sexual activity: Not on file   Other Topics Concern  . Not on file   Social History Narrative  . No narrative on file    Family History  Problem Relation Age of Onset  . CAD Mother   . CAD Father   . CAD Brother      Current Outpatient Prescriptions:  .  albuterol (PROVENTIL HFA;VENTOLIN HFA) 108 (90 Base) MCG/ACT inhaler, Inhale 2 puffs into the lungs every 4 (four) hours as needed for wheezing or shortness of breath., Disp: , Rfl:  .  chlorthalidone (HYGROTON)  25 MG tablet, Take 12.5 mg by mouth daily. , Disp: , Rfl:  .  COENZYME Q10 PO, Take 1 tablet by mouth daily., Disp: , Rfl:  .  colchicine 0.6 MG tablet, Take 0.6 mg by mouth daily. , Disp: , Rfl:  .  Cyanocobalamin (B-12) 1000 MCG CAPS, Take 3,000 Units by mouth daily. , Disp: , Rfl:  .  cycloSPORINE (RESTASIS) 0.05 % ophthalmic emulsion, Place 1 drop into both eyes 2 (two) times daily., Disp: , Rfl:  .  fluticasone (FLOVENT HFA) 110 MCG/ACT inhaler, Inhale 1 puff into the lungs 2 (two) times daily. , Disp: , Rfl:  .  gabapentin (NEURONTIN) 300 MG capsule, Take 300 mg by mouth 2 (two) times daily., Disp: , Rfl:  .  latanoprost (XALATAN) 0.005 % ophthalmic solution, Apply 1 drop to eye at bedtime. , Disp: , Rfl:  .  losartan (COZAAR) 50 MG tablet, Take 50 mg by mouth daily., Disp: , Rfl:  .  menthol-zinc oxide (GOLD BOND) powder, Apply topically once daily as needed (PRN under breasts for yeast)., Disp: , Rfl:  .  mometasone (ELOCON) 0.1 % lotion, Apply topically., Disp: , Rfl:  .  Multiple Vitamins-Minerals (OCUVITE  PRESERVISION PO), Take 1 tablet by mouth 2 (two) times daily., Disp: , Rfl:  .  PARoxetine (PAXIL) 40 MG tablet, Take 40 mg by mouth every morning., Disp: , Rfl:  .  potassium chloride (K-DUR,KLOR-CON) 10 MEQ tablet, Take 10 mEq by mouth 3 (three) times daily. , Disp: , Rfl:  .  RABEprazole (ACIPHEX) 20 MG tablet, Take 20 mg by mouth 2 (two) times daily., Disp: , Rfl:  .  sucralfate (CARAFATE) 1 g tablet, Take 1 tablet (1 g total) by mouth 4 (four) times daily -  with meals and at bedtime., Disp: 120 tablet, Rfl: 1 .  Tetrahydroz-Dextran-PEG-Povid (EYE DROPS ADVANCED RELIEF OP), Apply to eye., Disp: , Rfl:  .  timolol (TIMOPTIC-XR) 0.5 % ophthalmic gel-forming, Place 1 drop into both eyes daily., Disp: , Rfl:  .  valACYclovir (VALTREX) 1000 MG tablet, once daily as needed. Reported on 03/22/2015, Disp: , Rfl:  .  acetaminophen (TYLENOL) 650 MG CR tablet, Take 650 mg by mouth every 8  (eight) hours as needed for pain., Disp: , Rfl:  .  guaiFENesin (MUCINEX) 600 MG 12 hr tablet, Take 600 mg by mouth 2 (two) times daily as needed., Disp: , Rfl:  .  hydrocortisone (PROCTOSOL HC) 2.5 % rectal cream, PLACE RECTALLY TWICE A DAY, Disp: , Rfl:  .  loratadine (CLARITIN) 10 MG tablet, Take 10 mg by mouth daily as needed for allergies., Disp: , Rfl:  .  Polyethyl Glycol-Propyl Glycol (SYSTANE) 0.4-0.3 % SOLN, Apply 1 drop to eye as needed., Disp: , Rfl:   Physical exam:  Vitals:   09/19/16 1127  BP: (!) 173/78  Pulse: 76  Resp: 18  Temp: (!) 96.2 F (35.7 C)  TempSrc: Tympanic  Weight: 184 lb 3.2 oz (83.6 kg)   Physical Exam  Constitutional: She is oriented to person, place, and time.  Elderly women, walks with cane  HENT:  Head: Normocephalic and atraumatic.  Eyes: Pupils are equal, round, and reactive to light. EOM are normal.  Neck: Normal range of motion.  Cardiovascular: Normal rate, regular rhythm and normal heart sounds.   Pulmonary/Chest: Effort normal and breath sounds normal.  Abdominal: Soft. Bowel sounds are normal.  Neurological: She is alert and oriented to person, place, and time.  Skin: Skin is warm and dry.  Psychiatric: Mood and affect normal.     CMP Latest Ref Rng & Units 05/31/2016  Glucose 65 - 99 mg/dL 95  BUN 6 - 20 mg/dL 25(H)  Creatinine 0.44 - 1.00 mg/dL 0.64  Sodium 135 - 145 mmol/L 139  Potassium 3.5 - 5.1 mmol/L 3.1(L)  Chloride 101 - 111 mmol/L 104  CO2 22 - 32 mmol/L 29  Calcium 8.9 - 10.3 mg/dL 8.5(L)  Total Protein 6.5 - 8.1 g/dL -  Total Bilirubin 0.3 - 1.2 mg/dL -  Alkaline Phos 38 - 126 U/L -  AST 15 - 41 U/L -  ALT 14 - 54 U/L -   CBC Latest Ref Rng & Units 09/19/2016  WBC 3.6 - 11.0 K/uL 6.9  Hemoglobin 12.0 - 16.0 g/dL 10.5(L)  Hematocrit 35.0 - 47.0 % 31.3(L)  Platelets 150 - 440 K/uL 341    Assessment and plan- Patient is a 83 y.o. female with iron deficiency anemia likely due to bleeding AVM's. H&H today 10.5/31.3  however she is still microcytic. She has been unable to tolerate oral iron but denies any current bleeding.  Iron studies are pending at this time. Will plan to wait for iron  studies to come back then will call with consideration of 1-2 doses of feraheme. Plan to return to clinic in 3 months for cbc & iron studies and consideration of Feraheme. She acknowledges instructions to return to [facility] if she has any episodes of bleeding.     Visit Diagnosis 1. Iron deficiency anemia due to chronic blood loss     Beckey Rutter, NP  09/19/2016 1:45 PM

## 2016-09-19 NOTE — Progress Notes (Signed)
Here for follow up appt  

## 2016-09-19 NOTE — Telephone Encounter (Signed)
Feraheme once a week for 2 weeks, per 09/19/16 schd msg/Dr Janese Banks. L/M also mailed appt. MF

## 2016-09-26 ENCOUNTER — Inpatient Hospital Stay: Payer: Medicare Other

## 2016-09-26 VITALS — BP 153/74 | HR 72 | Temp 97.7°F | Resp 18

## 2016-09-26 DIAGNOSIS — D500 Iron deficiency anemia secondary to blood loss (chronic): Secondary | ICD-10-CM

## 2016-09-26 DIAGNOSIS — D509 Iron deficiency anemia, unspecified: Secondary | ICD-10-CM | POA: Diagnosis not present

## 2016-09-26 MED ORDER — SODIUM CHLORIDE 0.9 % IV SOLN
Freq: Once | INTRAVENOUS | Status: AC
  Administered 2016-09-26: 15:00:00 via INTRAVENOUS
  Filled 2016-09-26: qty 1000

## 2016-09-26 MED ORDER — SODIUM CHLORIDE 0.9 % IV SOLN
510.0000 mg | Freq: Once | INTRAVENOUS | Status: AC
  Administered 2016-09-26: 510 mg via INTRAVENOUS
  Filled 2016-09-26: qty 17

## 2016-10-03 ENCOUNTER — Inpatient Hospital Stay: Payer: Medicare Other

## 2016-10-03 VITALS — BP 130/75 | HR 78 | Temp 98.7°F | Resp 18

## 2016-10-03 DIAGNOSIS — D500 Iron deficiency anemia secondary to blood loss (chronic): Secondary | ICD-10-CM

## 2016-10-03 DIAGNOSIS — D509 Iron deficiency anemia, unspecified: Secondary | ICD-10-CM | POA: Diagnosis not present

## 2016-10-03 MED ORDER — SODIUM CHLORIDE 0.9 % IV SOLN
510.0000 mg | Freq: Once | INTRAVENOUS | Status: AC
  Administered 2016-10-03: 510 mg via INTRAVENOUS
  Filled 2016-10-03: qty 17

## 2016-10-03 MED ORDER — SODIUM CHLORIDE 0.9 % IV SOLN
Freq: Once | INTRAVENOUS | Status: AC
  Administered 2016-10-03: 15:00:00 via INTRAVENOUS
  Filled 2016-10-03: qty 1000

## 2016-12-19 ENCOUNTER — Ambulatory Visit: Payer: Medicare Other

## 2016-12-19 ENCOUNTER — Ambulatory Visit: Payer: Medicare Other | Admitting: Oncology

## 2016-12-19 ENCOUNTER — Other Ambulatory Visit: Payer: Medicare Other

## 2016-12-25 NOTE — Progress Notes (Signed)
Hematology/Oncology Consult note [facility]  Telephone:([phone] Fax:([phone]  Patient Care Team: Gearldine Shown, DO as PCP - General (Family Medicine)   Name of the patient: [name] [name]  [id number]  1925/12/23   Date of visit: 12/25/16  Diagnosis- iron deficiency anemia  Chief complaint/ Reason for visit- routine f/u of iron deficiency anemia  Heme/Onc history: Patient is a 83 year old female who was referred to the clinic in October 2017 for chronic iron deficiency anemia with history of diverticulosis. She has been unable to tolerate oral iron supplements due to abdominal cramping and diarrhea. She has required several blood transfusions in the past and received regular IV iron infusions. She has tolerated these without complications. Iron and blood transfusions have been given on a palliative basis with a goal hemoglobin of >10.   March 2018 she underwent an EGD and colonoscopy which did not reveal source of bleeding.   May  2/18 - enteroscopy revealed 3 avms which were ablated. She suffered melena and had a hemoglobin of 6.7 and was hospitalized. At that time, enteroscopy was repeated which was normal. She received a blood transfusion and her hemoglobin was 9.6.   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   08/07/2016 Capsule study showed active bleeding from single spot in proximal jejunum. She underwent push enteroscopy with argon laser therapy but no bleeding was found.   She last received feraheme in sept 2018  Interval history- reports sob on exertion. This is chronic and at her baseline  ECOG PS- 2 Pain scale- 0   Review of systems- Review of Systems  Constitutional: Negative for chills, fever, malaise/fatigue and weight loss.  HENT: Negative for congestion, ear discharge and nosebleeds.   Eyes: Negative for blurred vision.  Respiratory: Positive  for shortness of breath. Negative for cough, hemoptysis, sputum production and wheezing.   Cardiovascular: Negative for chest pain, palpitations, orthopnea and claudication.  Gastrointestinal: Negative for abdominal pain, blood in stool, constipation, diarrhea, heartburn, melena, nausea and vomiting.  Genitourinary: Negative for dysuria, flank pain, frequency, hematuria and urgency.  Musculoskeletal: Negative for back pain, joint pain and myalgias.  Skin: Negative for rash.  Neurological: Negative for dizziness, tingling, focal weakness, seizures, weakness and headaches.  Endo/Heme/Allergies: Does not bruise/bleed easily.  Psychiatric/Behavioral: Negative for depression and suicidal ideas. The patient does not have insomnia.       Allergies  Allergen Reactions  . Codeine Nausea And Vomiting  . Dextrans Hives  . Fentanyl Itching  . Lipitor [Atorvastatin] Other (See Comments)    Reaction: Muscle pain  . Lovastatin   . Mobic [Meloxicam] Other (See Comments)    Reaction: Mouth and tongue ulcers  . Niacin And Related Itching  . Nsaids Hives  . Other   . Pravachol [Pravastatin Sodium] Other (See Comments)    Reaction: Muscle pain  . Statins   . Tolmetin Hives  . Voltaren [Diclofenac Sodium] Other (See Comments)    Reaction: Mouth and tongue ulcers  . Zetia [Ezetimibe] Other (See Comments)    Reaction: Leg pain  . Niacin Itching     Past Medical History:  Diagnosis Date  . Anemia   . COPD (chronic obstructive pulmonary disease) ([place])   . Diverticulitis   . GERD (gastroesophageal reflux disease)   . Glaucoma   . HLD (hyperlipidemia)   . Hypertension   . Iron deficiency   . Rectal bleeding      Past  Surgical History:  Procedure Laterality Date  . ABDOMINAL HYSTERECTOMY    . APPENDECTOMY    . COLONOSCOPY WITH PROPOFOL N/A 04/14/2016   Procedure: COLONOSCOPY WITH PROPOFOL;  Surgeon: Lucilla Lame, MD;  Location: [facility] ENDOSCOPY;  Service: Endoscopy;  Laterality: N/A;  .  ENTEROSCOPY N/A 05/10/2016   Procedure: Push ENTEROSCOPY with pediatric colonoscope;  Surgeon: [name], MD;  Location: [facility] ENDOSCOPY;  Service: Endoscopy;  Laterality: N/A;  . ENTEROSCOPY N/A 05/30/2016   Procedure: push ENTEROSCOPY;  Surgeon: [name], MD;  Location: [facility] ENDOSCOPY;  Service: Endoscopy;  Laterality: N/A;  . ESOPHAGOGASTRODUODENOSCOPY (EGD) WITH PROPOFOL N/A 08/12/2015   Procedure: ESOPHAGOGASTRODUODENOSCOPY (EGD) WITH PROPOFOL;  Surgeon: Lollie Sails, MD;  Location: [facility] ENDOSCOPY;  Service: Endoscopy;  Laterality: N/A;  . ESOPHAGOGASTRODUODENOSCOPY (EGD) WITH PROPOFOL N/A 04/03/2016   Procedure: ESOPHAGOGASTRODUODENOSCOPY (EGD) WITH PROPOFOL;  Surgeon: Lucilla Lame, MD;  Location: [facility] ENDOSCOPY;  Service: Endoscopy;  Laterality: N/A;  . GIVENS CAPSULE STUDY N/A 04/28/2016   Procedure: GIVENS CAPSULE STUDY;  Surgeon: [name], MD;  Location: [facility] ENDOSCOPY;  Service: Endoscopy;  Laterality: N/A;  . JOINT REPLACEMENT      Social History   Socioeconomic History  . Marital status: Widowed    Spouse name: Not on file  . Number of children: Not on file  . Years of education: Not on file  . Highest education level: Not on file  Social Needs  . Financial resource strain: Not on file  . Food insecurity - worry: Not on file  . Food insecurity - inability: Not on file  . Transportation needs - medical: Not on file  . Transportation needs - non-medical: Not on file  Occupational History  . Not on file  Tobacco Use  . Smoking status: Former Smoker    Types: Cigarettes  . Smokeless tobacco: Never Used  Substance and Sexual Activity  . Alcohol use: No  . Drug use: No  . Sexual activity: Not on file  Other Topics Concern  . Not on file  Social History Narrative  . Not on file    Family History  Problem Relation Age of Onset  . CAD Mother   . CAD Father   . CAD Brother      Current Outpatient Medications:  .  acetaminophen (TYLENOL) 650 MG CR tablet, Take  650 mg by mouth every 8 (eight) hours as needed for pain., Disp: , Rfl:  .  albuterol (PROVENTIL HFA;VENTOLIN HFA) 108 (90 Base) MCG/ACT inhaler, Inhale 2 puffs into the lungs every 4 (four) hours as needed for wheezing or shortness of breath., Disp: , Rfl:  .  chlorthalidone (HYGROTON) 25 MG tablet, Take 12.5 mg by mouth daily. , Disp: , Rfl:  .  COENZYME Q10 PO, Take 1 tablet by mouth daily., Disp: , Rfl:  .  colchicine 0.6 MG tablet, Take 0.6 mg by mouth daily. , Disp: , Rfl:  .  Cyanocobalamin (B-12) 1000 MCG CAPS, Take 3,000 Units by mouth daily. , Disp: , Rfl:  .  cycloSPORINE (RESTASIS) 0.05 % ophthalmic emulsion, Place 1 drop into both eyes 2 (two) times daily., Disp: , Rfl:  .  fluticasone (FLOVENT HFA) 110 MCG/ACT inhaler, Inhale 1 puff into the lungs 2 (two) times daily. , Disp: , Rfl:  .  gabapentin (NEURONTIN) 300 MG capsule, Take 300 mg by mouth 2 (two) times daily., Disp: , Rfl:  .  guaiFENesin (MUCINEX) 600 MG 12 hr tablet, Take 600 mg by mouth 2 (two) times daily as  needed., Disp: , Rfl:  .  hydrocortisone (PROCTOSOL HC) 2.5 % rectal cream, PLACE RECTALLY TWICE A DAY, Disp: , Rfl:  .  latanoprost (XALATAN) 0.005 % ophthalmic solution, Apply 1 drop to eye at bedtime. , Disp: , Rfl:  .  loratadine (CLARITIN) 10 MG tablet, Take 10 mg by mouth daily as needed for allergies., Disp: , Rfl:  .  losartan (COZAAR) 50 MG tablet, Take 50 mg by mouth daily., Disp: , Rfl:  .  menthol-zinc oxide (GOLD BOND) powder, Apply topically once daily as needed (PRN under breasts for yeast)., Disp: , Rfl:  .  mometasone (ELOCON) 0.1 % lotion, Apply topically., Disp: , Rfl:  .  Multiple Vitamins-Minerals (OCUVITE PRESERVISION PO), Take 1 tablet by mouth 2 (two) times daily., Disp: , Rfl:  .  PARoxetine (PAXIL) 40 MG tablet, Take 40 mg by mouth every morning., Disp: , Rfl:  .  Polyethyl Glycol-Propyl Glycol (SYSTANE) 0.4-0.3 % SOLN, Apply 1 drop to eye as needed., Disp: , Rfl:  .  potassium chloride  (K-DUR,KLOR-CON) 10 MEQ tablet, Take 10 mEq by mouth 3 (three) times daily. , Disp: , Rfl:  .  RABEprazole (ACIPHEX) 20 MG tablet, Take 20 mg by mouth 2 (two) times daily., Disp: , Rfl:  .  sucralfate (CARAFATE) 1 g tablet, Take 1 tablet (1 g total) by mouth 4 (four) times daily -  with meals and at bedtime., Disp: 120 tablet, Rfl: 1 .  Tetrahydroz-Dextran-PEG-Povid (EYE DROPS ADVANCED RELIEF OP), Apply to eye., Disp: , Rfl:  .  timolol (TIMOPTIC-XR) 0.5 % ophthalmic gel-forming, Place 1 drop into both eyes daily., Disp: , Rfl:  .  valACYclovir (VALTREX) 1000 MG tablet, once daily as needed. Reported on 03/22/2015, Disp: , Rfl:   Physical exam:  Vitals:   12/26/16 1027  BP: 138/74  Pulse: 79  Resp: 18  Temp: 98.4 F (36.9 C)  TempSrc: Tympanic  Weight: 182 lb (82.6 kg)   Physical Exam  Constitutional: She is oriented to person, place, and time and well-developed, well-nourished, and in no distress.  HENT:  Head: Normocephalic and atraumatic.  Eyes: EOM are normal. Pupils are equal, round, and reactive to light.  Neck: Normal range of motion.  Cardiovascular: Normal rate, regular rhythm and normal heart sounds.  Pulmonary/Chest: Effort normal and breath sounds normal.  Abdominal: Soft. Bowel sounds are normal.  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  Skin: Skin is warm and dry.     CMP Latest Ref Rng & Units 05/31/2016  Glucose 65 - 99 mg/dL 95  BUN 6 - 20 mg/dL 25(H)  Creatinine 0.44 - 1.00 mg/dL 0.64  Sodium 135 - 145 mmol/L 139  Potassium 3.5 - 5.1 mmol/L 3.1(L)  Chloride 101 - 111 mmol/L 104  CO2 22 - 32 mmol/L 29  Calcium 8.9 - 10.3 mg/dL 8.5(L)  Total Protein 6.5 - 8.1 g/dL -  Total Bilirubin 0.3 - 1.2 mg/dL -  Alkaline Phos 38 - 126 U/L -  AST 15 - 41 U/L -  ALT 14 - 54 U/L -   CBC Latest Ref Rng & Units 12/26/2016  WBC 3.6 - 11.0 K/uL 6.8  Hemoglobin 12.0 - 16.0 g/dL 12.4    Hematocrit 35.0 - 47.0 % 37.2  Platelets 150 - 440 K/uL 301      Assessment and plan- Patient is a 83 y.o. female with iron deficiency anemia secondary to bleeding AVM  H&H is currently stable and she is not anemic at this time.  I do not foresee the need for IV iron.  Iron studies are pending.  Repeat CBC ferritin and iron studies in 3 months in 6 months and I will see her back in 6 months.  She will call us in the interim if she has any evidence of overt bleeding or worsening fatigue   Visit Diagnosis 1. Iron deficiency anemia due to chronic blood loss      Dr. Randa Evens, MD, MPH [facility] at [facility] Pager- [phone] 12/26/2016 10:42 AM

## 2016-12-26 ENCOUNTER — Inpatient Hospital Stay: Payer: Medicare Other | Attending: Oncology

## 2016-12-26 ENCOUNTER — Inpatient Hospital Stay: Payer: Medicare Other

## 2016-12-26 ENCOUNTER — Encounter: Payer: Self-pay | Admitting: Oncology

## 2016-12-26 ENCOUNTER — Inpatient Hospital Stay (HOSPITAL_BASED_OUTPATIENT_CLINIC_OR_DEPARTMENT_OTHER): Payer: Medicare Other | Admitting: Oncology

## 2016-12-26 VITALS — BP 138/74 | HR 79 | Temp 98.4°F | Resp 18 | Wt 182.0 lb

## 2016-12-26 DIAGNOSIS — R0602 Shortness of breath: Secondary | ICD-10-CM | POA: Insufficient documentation

## 2016-12-26 DIAGNOSIS — I10 Essential (primary) hypertension: Secondary | ICD-10-CM | POA: Insufficient documentation

## 2016-12-26 DIAGNOSIS — Z87891 Personal history of nicotine dependence: Secondary | ICD-10-CM | POA: Diagnosis not present

## 2016-12-26 DIAGNOSIS — K219 Gastro-esophageal reflux disease without esophagitis: Secondary | ICD-10-CM

## 2016-12-26 DIAGNOSIS — E785 Hyperlipidemia, unspecified: Secondary | ICD-10-CM | POA: Insufficient documentation

## 2016-12-26 DIAGNOSIS — Z8719 Personal history of other diseases of the digestive system: Secondary | ICD-10-CM | POA: Insufficient documentation

## 2016-12-26 DIAGNOSIS — D500 Iron deficiency anemia secondary to blood loss (chronic): Secondary | ICD-10-CM | POA: Diagnosis not present

## 2016-12-26 DIAGNOSIS — J449 Chronic obstructive pulmonary disease, unspecified: Secondary | ICD-10-CM

## 2016-12-26 DIAGNOSIS — Z79899 Other long term (current) drug therapy: Secondary | ICD-10-CM | POA: Insufficient documentation

## 2016-12-26 DIAGNOSIS — K5521 Angiodysplasia of colon with hemorrhage: Secondary | ICD-10-CM

## 2016-12-26 LAB — CBC WITH DIFFERENTIAL/PLATELET
Basophils Absolute: 0.2 10*3/uL — ABNORMAL HIGH (ref 0–0.1)
Basophils Relative: 3 %
EOS ABS: 0.2 10*3/uL (ref 0–0.7)
Eosinophils Relative: 3 %
HEMATOCRIT: 37.2 % (ref 35.0–47.0)
HEMOGLOBIN: 12.4 g/dL (ref 12.0–16.0)
LYMPHS ABS: 1 10*3/uL (ref 1.0–3.6)
Lymphocytes Relative: 14 %
MCH: 28.6 pg (ref 26.0–34.0)
MCHC: 33.3 g/dL (ref 32.0–36.0)
MCV: 86.1 fL (ref 80.0–100.0)
MONO ABS: 0.8 10*3/uL (ref 0.2–0.9)
MONOS PCT: 12 %
NEUTROS ABS: 4.6 10*3/uL (ref 1.4–6.5)
NEUTROS PCT: 68 %
Platelets: 301 10*3/uL (ref 150–440)
RBC: 4.32 MIL/uL (ref 3.80–5.20)
RDW: 17.3 % — AB (ref 11.5–14.5)
WBC: 6.8 10*3/uL (ref 3.6–11.0)

## 2016-12-26 LAB — IRON AND TIBC
IRON: 34 ug/dL (ref 28–170)
SATURATION RATIOS: 11 % (ref 10.4–31.8)
TIBC: 322 ug/dL (ref 250–450)
UIBC: 288 ug/dL

## 2016-12-26 LAB — FERRITIN: Ferritin: 31 ng/mL (ref 11–307)

## 2016-12-26 NOTE — Progress Notes (Signed)
Patient here for follow up with labs today and possible IV Feraheme. She states that she is feeling well except for shortness of breath on exertion. She denies having any pain.

## 2017-01-17 DIAGNOSIS — Z0000 Encounter for general adult medical examination without abnormal findings: Secondary | ICD-10-CM | POA: Insufficient documentation

## 2017-03-27 ENCOUNTER — Telehealth: Payer: Self-pay | Admitting: *Deleted

## 2017-03-27 ENCOUNTER — Inpatient Hospital Stay: Payer: Medicare Other | Attending: Oncology

## 2017-03-27 ENCOUNTER — Other Ambulatory Visit: Payer: Self-pay | Admitting: Oncology

## 2017-03-27 DIAGNOSIS — D500 Iron deficiency anemia secondary to blood loss (chronic): Secondary | ICD-10-CM | POA: Diagnosis not present

## 2017-03-27 DIAGNOSIS — Z79899 Other long term (current) drug therapy: Secondary | ICD-10-CM | POA: Insufficient documentation

## 2017-03-27 LAB — CBC WITH DIFFERENTIAL/PLATELET
Basophils Absolute: 0.2 10*3/uL — ABNORMAL HIGH (ref 0–0.1)
Basophils Relative: 2 %
Eosinophils Absolute: 0.2 10*3/uL (ref 0–0.7)
Eosinophils Relative: 3 %
HCT: 27.5 % — ABNORMAL LOW (ref 35.0–47.0)
HEMOGLOBIN: 8.9 g/dL — AB (ref 12.0–16.0)
LYMPHS ABS: 1.5 10*3/uL (ref 1.0–3.6)
LYMPHS PCT: 19 %
MCH: 24.6 pg — AB (ref 26.0–34.0)
MCHC: 32.4 g/dL (ref 32.0–36.0)
MCV: 76 fL — AB (ref 80.0–100.0)
MONOS PCT: 14 %
Monocytes Absolute: 1.1 10*3/uL — ABNORMAL HIGH (ref 0.2–0.9)
NEUTROS PCT: 62 %
Neutro Abs: 5 10*3/uL (ref 1.4–6.5)
Platelets: 438 10*3/uL (ref 150–440)
RBC: 3.62 MIL/uL — AB (ref 3.80–5.20)
RDW: 15.5 % — ABNORMAL HIGH (ref 11.5–14.5)
WBC: 8.1 10*3/uL (ref 3.6–11.0)

## 2017-03-27 LAB — IRON AND TIBC
Iron: 9 ug/dL — ABNORMAL LOW (ref 28–170)
Saturation Ratios: 2 % — ABNORMAL LOW (ref 10.4–31.8)
TIBC: 440 ug/dL (ref 250–450)
UIBC: 431 ug/dL

## 2017-03-27 LAB — FERRITIN: Ferritin: 7 ng/mL — ABNORMAL LOW (ref 11–307)

## 2017-03-27 NOTE — Telephone Encounter (Signed)
Per Dr. Elroy Channel request, called patient and informed her of low hgb and the need for two doses of IV Feraheme, one this week and one next week. We will re-check her labs in 2 months. She is agreeable to this plan. Message sent to scheduling.      dhs

## 2017-03-28 NOTE — Progress Notes (Signed)
[name] schd for 03/30/17 & 04/06/17 as requested, with lab only on 05/30/17.  Appts schd and conf with patient. She will also pick up a copy of the updated appt schd on her upcoming visit on Friday 03/30/17.   Verdis [name]

## 2017-03-30 ENCOUNTER — Inpatient Hospital Stay: Payer: Medicare Other

## 2017-03-30 VITALS — BP 101/60 | HR 75 | Temp 98.8°F | Resp 18

## 2017-03-30 DIAGNOSIS — D500 Iron deficiency anemia secondary to blood loss (chronic): Secondary | ICD-10-CM

## 2017-03-30 MED ORDER — SODIUM CHLORIDE 0.9 % IV SOLN
Freq: Once | INTRAVENOUS | Status: AC
Start: 2017-03-30 — End: 2017-03-30
  Administered 2017-03-30: 14:00:00 via INTRAVENOUS
  Filled 2017-03-30: qty 1000

## 2017-03-30 MED ORDER — SODIUM CHLORIDE 0.9 % IV SOLN
510.0000 mg | Freq: Once | INTRAVENOUS | Status: AC
  Administered 2017-03-30: 510 mg via INTRAVENOUS
  Filled 2017-03-30: qty 17

## 2017-04-02 ENCOUNTER — Telehealth: Payer: Self-pay | Admitting: *Deleted

## 2017-04-02 NOTE — Telephone Encounter (Signed)
Called pt to see if she would be agreeable to getting endoscopic capsule at [facility].  She is agreeable to this. She already got one dose of feraheme last week and she is scheduled to get her next dose this Friday 3/29.  I told her that I would call Dr. Vicente Males or [facility] and let her know.  I called [facility] and spoke to [place] and she states for me to fax labs and notes to [phone]. I faxed info to above. And I called pt and let her know that the note from [facility]  From Dr. Mariea Clonts said that if she has a drop in hgb again we are to call them and I did and they call pt if she needs another appt. I faxed all info into [facility] for theirreview

## 2017-04-06 ENCOUNTER — Inpatient Hospital Stay: Payer: Medicare Other

## 2017-04-06 VITALS — BP 108/61 | HR 73 | Temp 96.8°F | Resp 18

## 2017-04-06 DIAGNOSIS — D500 Iron deficiency anemia secondary to blood loss (chronic): Secondary | ICD-10-CM

## 2017-04-06 MED ORDER — FERUMOXYTOL INJECTION 510 MG/17 ML
510.0000 mg | Freq: Once | INTRAVENOUS | Status: AC
  Administered 2017-04-06: 510 mg via INTRAVENOUS
  Filled 2017-04-06: qty 17

## 2017-04-06 MED ORDER — SODIUM CHLORIDE 0.9 % IV SOLN
Freq: Once | INTRAVENOUS | Status: AC
  Administered 2017-04-06: 14:00:00 via INTRAVENOUS
  Filled 2017-04-06: qty 1000

## 2017-04-06 NOTE — Progress Notes (Signed)
Pt monitored 30 minutes post infusion. Pt tolerated infusion well. Pt and VS stable at discharge.

## 2017-05-30 ENCOUNTER — Inpatient Hospital Stay: Payer: Medicare Other | Attending: Oncology

## 2017-05-30 DIAGNOSIS — D500 Iron deficiency anemia secondary to blood loss (chronic): Secondary | ICD-10-CM | POA: Insufficient documentation

## 2017-05-30 DIAGNOSIS — Z79899 Other long term (current) drug therapy: Secondary | ICD-10-CM | POA: Diagnosis not present

## 2017-05-30 LAB — CBC WITH DIFFERENTIAL/PLATELET
Basophils Absolute: 0.2 10*3/uL — ABNORMAL HIGH (ref 0–0.1)
Basophils Relative: 3 %
EOS PCT: 4 %
Eosinophils Absolute: 0.3 10*3/uL (ref 0–0.7)
HCT: 34.2 % — ABNORMAL LOW (ref 35.0–47.0)
Hemoglobin: 11.3 g/dL — ABNORMAL LOW (ref 12.0–16.0)
LYMPHS ABS: 0.8 10*3/uL — AB (ref 1.0–3.6)
LYMPHS PCT: 12 %
MCH: 26.7 pg (ref 26.0–34.0)
MCHC: 33.1 g/dL (ref 32.0–36.0)
MCV: 80.8 fL (ref 80.0–100.0)
MONOS PCT: 12 %
Monocytes Absolute: 0.9 10*3/uL (ref 0.2–0.9)
Neutro Abs: 5.1 10*3/uL (ref 1.4–6.5)
Neutrophils Relative %: 69 %
Platelets: 361 10*3/uL (ref 150–440)
RBC: 4.23 MIL/uL (ref 3.80–5.20)
RDW: 21.9 % — ABNORMAL HIGH (ref 11.5–14.5)
WBC: 7.3 10*3/uL (ref 3.6–11.0)

## 2017-05-30 LAB — IRON AND TIBC
Iron: 23 ug/dL — ABNORMAL LOW (ref 28–170)
SATURATION RATIOS: 6 % — AB (ref 10.4–31.8)
TIBC: 391 ug/dL (ref 250–450)
UIBC: 368 ug/dL

## 2017-05-30 LAB — FERRITIN: Ferritin: 18 ng/mL (ref 11–307)

## 2017-06-01 ENCOUNTER — Telehealth: Payer: Self-pay | Admitting: *Deleted

## 2017-06-01 NOTE — Telephone Encounter (Signed)
I called patient and let her know labs and that her labs indicate that she needs 1 dose of feraheme and jenny NP would put order in and I have scheduled it for 5/29 at 1:30 and pt is agreeable to this

## 2017-06-06 ENCOUNTER — Other Ambulatory Visit: Payer: Self-pay | Admitting: Oncology

## 2017-06-06 ENCOUNTER — Inpatient Hospital Stay: Payer: Medicare Other

## 2017-06-06 VITALS — BP 115/72 | HR 73 | Temp 95.4°F | Resp 18

## 2017-06-06 DIAGNOSIS — D500 Iron deficiency anemia secondary to blood loss (chronic): Secondary | ICD-10-CM

## 2017-06-06 MED ORDER — SODIUM CHLORIDE 0.9 % IV SOLN
Freq: Once | INTRAVENOUS | Status: AC
  Administered 2017-06-06: 14:00:00 via INTRAVENOUS
  Filled 2017-06-06: qty 1000

## 2017-06-06 MED ORDER — SODIUM CHLORIDE 0.9 % IV SOLN
510.0000 mg | Freq: Once | INTRAVENOUS | Status: AC
  Administered 2017-06-06: 510 mg via INTRAVENOUS
  Filled 2017-06-06: qty 17

## 2017-06-11 ENCOUNTER — Telehealth: Payer: Self-pay | Admitting: *Deleted

## 2017-06-11 ENCOUNTER — Other Ambulatory Visit: Payer: Self-pay | Admitting: Oncology

## 2017-06-11 NOTE — Telephone Encounter (Signed)
-----   Message from Sindy Guadel[place], MD sent at 06/11/2017  8:14 AM EDT ----- She needs feraheme. I will put it in. Thanks, Astrid Divine

## 2017-06-11 NOTE — Telephone Encounter (Signed)
Called pt and Dr. Janese Banks is back from vacation and she feels pt needs 2 doses since she has bleeding at times and we don't know from where.  She is agreeable for 1 more dose and can come this Thursday at 11 am and appt in computer and pt will be here.  The patient did say [facility] called her about her capsule study and she will repeat one with Dr. Vicente Males.

## 2017-06-12 ENCOUNTER — Other Ambulatory Visit: Payer: Self-pay

## 2017-06-12 DIAGNOSIS — R52 Pain, unspecified: Secondary | ICD-10-CM | POA: Insufficient documentation

## 2017-06-12 DIAGNOSIS — M797 Fibromyalgia: Secondary | ICD-10-CM | POA: Insufficient documentation

## 2017-06-12 DIAGNOSIS — D500 Iron deficiency anemia secondary to blood loss (chronic): Secondary | ICD-10-CM

## 2017-06-12 NOTE — Progress Notes (Signed)
Scheduled capsule study.   Mailed preparation instructions on 06/13/17.

## 2017-06-14 ENCOUNTER — Ambulatory Visit: Payer: Medicare Other

## 2017-06-14 ENCOUNTER — Inpatient Hospital Stay: Payer: Medicare Other | Attending: Oncology

## 2017-06-14 VITALS — BP 111/65 | HR 80 | Temp 99.1°F | Resp 20

## 2017-06-14 DIAGNOSIS — J449 Chronic obstructive pulmonary disease, unspecified: Secondary | ICD-10-CM | POA: Insufficient documentation

## 2017-06-14 DIAGNOSIS — R110 Nausea: Secondary | ICD-10-CM | POA: Insufficient documentation

## 2017-06-14 DIAGNOSIS — R51 Headache: Secondary | ICD-10-CM | POA: Insufficient documentation

## 2017-06-14 DIAGNOSIS — K5520 Angiodysplasia of colon without hemorrhage: Secondary | ICD-10-CM | POA: Insufficient documentation

## 2017-06-14 DIAGNOSIS — R109 Unspecified abdominal pain: Secondary | ICD-10-CM | POA: Insufficient documentation

## 2017-06-14 DIAGNOSIS — R5383 Other fatigue: Secondary | ICD-10-CM | POA: Insufficient documentation

## 2017-06-14 DIAGNOSIS — I10 Essential (primary) hypertension: Secondary | ICD-10-CM | POA: Diagnosis not present

## 2017-06-14 DIAGNOSIS — R531 Weakness: Secondary | ICD-10-CM | POA: Insufficient documentation

## 2017-06-14 DIAGNOSIS — R197 Diarrhea, unspecified: Secondary | ICD-10-CM | POA: Diagnosis not present

## 2017-06-14 DIAGNOSIS — D500 Iron deficiency anemia secondary to blood loss (chronic): Secondary | ICD-10-CM | POA: Insufficient documentation

## 2017-06-14 DIAGNOSIS — Z87891 Personal history of nicotine dependence: Secondary | ICD-10-CM | POA: Diagnosis not present

## 2017-06-14 DIAGNOSIS — Z79899 Other long term (current) drug therapy: Secondary | ICD-10-CM | POA: Insufficient documentation

## 2017-06-14 DIAGNOSIS — E785 Hyperlipidemia, unspecified: Secondary | ICD-10-CM | POA: Diagnosis not present

## 2017-06-14 DIAGNOSIS — K219 Gastro-esophageal reflux disease without esophagitis: Secondary | ICD-10-CM | POA: Insufficient documentation

## 2017-06-14 MED ORDER — SODIUM CHLORIDE 0.9 % IV SOLN
Freq: Once | INTRAVENOUS | Status: AC
  Administered 2017-06-14: 11:00:00 via INTRAVENOUS
  Filled 2017-06-14: qty 1000

## 2017-06-14 MED ORDER — FERUMOXYTOL INJECTION 510 MG/17 ML
510.0000 mg | Freq: Once | INTRAVENOUS | Status: AC
  Administered 2017-06-14: 510 mg via INTRAVENOUS
  Filled 2017-06-14: qty 17

## 2017-06-21 ENCOUNTER — Encounter
Admission: RE | Disposition: A | Discharge status: Home / Self Care | Payer: Self-pay | Source: Ambulatory Visit | Attending: Gastroenterology

## 2017-06-21 ENCOUNTER — Ambulatory Visit
Admission: RE | Admit: 2017-06-21 | Discharge: 2017-06-21 | Disposition: A | Discharge status: Home / Self Care | Payer: Medicare Other | Source: Ambulatory Visit | Attending: Gastroenterology | Admitting: Gastroenterology

## 2017-06-21 ENCOUNTER — Encounter: Payer: Self-pay | Admitting: Anesthesiology

## 2017-06-21 DIAGNOSIS — D509 Iron deficiency anemia, unspecified: Secondary | ICD-10-CM | POA: Insufficient documentation

## 2017-06-21 DIAGNOSIS — K921 Melena: Secondary | ICD-10-CM | POA: Diagnosis not present

## 2017-06-21 HISTORY — PX: GIVENS CAPSULE STUDY: SHX5432

## 2017-06-21 SURGERY — IMAGING PROCEDURE, GI TRACT, INTRALUMINAL, VIA CAPSULE

## 2017-06-25 ENCOUNTER — Other Ambulatory Visit: Payer: Self-pay

## 2017-06-25 DIAGNOSIS — D500 Iron deficiency anemia secondary to blood loss (chronic): Secondary | ICD-10-CM

## 2017-06-26 ENCOUNTER — Other Ambulatory Visit: Payer: Self-pay

## 2017-06-26 ENCOUNTER — Emergency Department: Payer: Medicare Other

## 2017-06-26 ENCOUNTER — Inpatient Hospital Stay (HOSPITAL_BASED_OUTPATIENT_CLINIC_OR_DEPARTMENT_OTHER): Payer: Medicare Other | Admitting: Oncology

## 2017-06-26 ENCOUNTER — Encounter: Payer: Self-pay | Admitting: Oncology

## 2017-06-26 ENCOUNTER — Emergency Department
Admission: EM | Admit: 2017-06-26 | Discharge: 2017-06-26 | Disposition: A | Discharge status: Home / Self Care | Payer: Medicare Other | Attending: Emergency Medicine | Admitting: Emergency Medicine

## 2017-06-26 ENCOUNTER — Inpatient Hospital Stay: Payer: Medicare Other

## 2017-06-26 VITALS — BP 167/89 | HR 71 | Temp 98.1°F | Resp 18 | Ht 69.0 in | Wt 186.0 lb

## 2017-06-26 DIAGNOSIS — J449 Chronic obstructive pulmonary disease, unspecified: Secondary | ICD-10-CM

## 2017-06-26 DIAGNOSIS — Z87891 Personal history of nicotine dependence: Secondary | ICD-10-CM

## 2017-06-26 DIAGNOSIS — R110 Nausea: Secondary | ICD-10-CM

## 2017-06-26 DIAGNOSIS — I10 Essential (primary) hypertension: Secondary | ICD-10-CM | POA: Diagnosis not present

## 2017-06-26 DIAGNOSIS — D500 Iron deficiency anemia secondary to blood loss (chronic): Secondary | ICD-10-CM | POA: Diagnosis not present

## 2017-06-26 DIAGNOSIS — K219 Gastro-esophageal reflux disease without esophagitis: Secondary | ICD-10-CM | POA: Diagnosis not present

## 2017-06-26 DIAGNOSIS — R51 Headache: Secondary | ICD-10-CM

## 2017-06-26 DIAGNOSIS — R42 Dizziness and giddiness: Secondary | ICD-10-CM | POA: Diagnosis not present

## 2017-06-26 DIAGNOSIS — Z79899 Other long term (current) drug therapy: Secondary | ICD-10-CM

## 2017-06-26 DIAGNOSIS — R531 Weakness: Secondary | ICD-10-CM | POA: Diagnosis not present

## 2017-06-26 DIAGNOSIS — Z96649 Presence of unspecified artificial hip joint: Secondary | ICD-10-CM | POA: Diagnosis not present

## 2017-06-26 DIAGNOSIS — R519 Headache, unspecified: Secondary | ICD-10-CM

## 2017-06-26 DIAGNOSIS — K5520 Angiodysplasia of colon without hemorrhage: Secondary | ICD-10-CM

## 2017-06-26 DIAGNOSIS — R109 Unspecified abdominal pain: Secondary | ICD-10-CM | POA: Diagnosis not present

## 2017-06-26 DIAGNOSIS — R197 Diarrhea, unspecified: Secondary | ICD-10-CM

## 2017-06-26 DIAGNOSIS — E785 Hyperlipidemia, unspecified: Secondary | ICD-10-CM

## 2017-06-26 DIAGNOSIS — Z96659 Presence of unspecified artificial knee joint: Secondary | ICD-10-CM | POA: Insufficient documentation

## 2017-06-26 DIAGNOSIS — R5383 Other fatigue: Secondary | ICD-10-CM | POA: Diagnosis not present

## 2017-06-26 LAB — CBC WITH DIFFERENTIAL/PLATELET
Basophils Absolute: 0.2 10*3/uL — ABNORMAL HIGH (ref 0–0.1)
Basophils Relative: 2 %
EOS ABS: 0.3 10*3/uL (ref 0–0.7)
Eosinophils Relative: 4 %
HCT: 33.7 % — ABNORMAL LOW (ref 35.0–47.0)
HEMOGLOBIN: 11.3 g/dL — AB (ref 12.0–16.0)
Lymphocytes Relative: 14 %
Lymphs Abs: 1.1 10*3/uL (ref 1.0–3.6)
MCH: 28.1 pg (ref 26.0–34.0)
MCHC: 33.7 g/dL (ref 32.0–36.0)
MCV: 83.6 fL (ref 80.0–100.0)
MONOS PCT: 12 %
Monocytes Absolute: 1 10*3/uL — ABNORMAL HIGH (ref 0.2–0.9)
NEUTROS PCT: 68 %
Neutro Abs: 5.2 10*3/uL (ref 1.4–6.5)
Platelets: 284 10*3/uL (ref 150–440)
RBC: 4.03 MIL/uL (ref 3.80–5.20)
RDW: 22.6 % — ABNORMAL HIGH (ref 11.5–14.5)
WBC: 7.7 10*3/uL (ref 3.6–11.0)

## 2017-06-26 LAB — COMPREHENSIVE METABOLIC PANEL
ALBUMIN: 3.9 g/dL (ref 3.5–5.0)
ALT: 17 U/L (ref 14–54)
ANION GAP: 7 (ref 5–15)
AST: 27 U/L (ref 15–41)
Alkaline Phosphatase: 58 U/L (ref 38–126)
BUN: 20 mg/dL (ref 6–20)
CHLORIDE: 99 mmol/L — AB (ref 101–111)
CO2: 26 mmol/L (ref 22–32)
Calcium: 9.3 mg/dL (ref 8.9–10.3)
Creatinine, Ser: 0.65 mg/dL (ref 0.44–1.00)
GFR calc non Af Amer: >60 mL/min (ref >60)
GLUCOSE: 89 mg/dL (ref 65–99)
POTASSIUM: 3.6 mmol/L (ref 3.5–5.1)
SODIUM: 132 mmol/L — AB (ref 135–145)
Total Bilirubin: 0.4 mg/dL (ref 0.3–1.2)
Total Protein: 7.2 g/dL (ref 6.5–8.1)

## 2017-06-26 LAB — IRON AND TIBC
Iron: 57 ug/dL (ref 28–170)
Saturation Ratios: 19 % (ref 10.4–31.8)
TIBC: 307 ug/dL (ref 250–450)
UIBC: 250 ug/dL

## 2017-06-26 LAB — CBC
HCT: 35.1 % (ref 35.0–47.0)
Hemoglobin: 11.7 g/dL — ABNORMAL LOW (ref 12.0–16.0)
MCH: 28 pg (ref 26.0–34.0)
MCHC: 33.2 g/dL (ref 32.0–36.0)
MCV: 84.4 fL (ref 80.0–100.0)
PLATELETS: 295 10*3/uL (ref 150–440)
RBC: 4.16 MIL/uL (ref 3.80–5.20)
RDW: 22.7 % — AB (ref 11.5–14.5)
WBC: 7.6 10*3/uL (ref 3.6–11.0)

## 2017-06-26 LAB — URINALYSIS, COMPLETE (UACMP) WITH MICROSCOPIC
Bilirubin Urine: NEGATIVE
GLUCOSE, UA: NEGATIVE mg/dL
Hgb urine dipstick: NEGATIVE
Ketones, ur: NEGATIVE mg/dL
Leukocytes, UA: NEGATIVE
Nitrite: NEGATIVE
PH: 6 (ref 5.0–8.0)
PROTEIN: NEGATIVE mg/dL
SQUAMOUS EPITHELIAL / LPF: NONE SEEN (ref 0–5)
Specific Gravity, Urine: 1.01 (ref 1.005–1.030)

## 2017-06-26 LAB — BASIC METABOLIC PANEL
Anion gap: 11 (ref 5–15)
BUN: 20 mg/dL (ref 6–20)
CHLORIDE: 97 mmol/L — AB (ref 101–111)
CO2: 26 mmol/L (ref 22–32)
CREATININE: 0.68 mg/dL (ref 0.44–1.00)
Calcium: 9.3 mg/dL (ref 8.9–10.3)
GFR calc Af Amer: >60 mL/min (ref >60)
GFR calc non Af Amer: >60 mL/min (ref >60)
GLUCOSE: 93 mg/dL (ref 65–99)
Potassium: 3.8 mmol/L (ref 3.5–5.1)
Sodium: 134 mmol/L — ABNORMAL LOW (ref 135–145)

## 2017-06-26 LAB — TROPONIN I: Troponin I: <0.03 ng/mL (ref <0.03)

## 2017-06-26 LAB — FERRITIN: FERRITIN: 391 ng/mL — AB (ref 11–307)

## 2017-06-26 MED ORDER — METOCLOPRAMIDE HCL 5 MG/ML IJ SOLN
10.0000 mg | Freq: Once | INTRAMUSCULAR | Status: AC
  Administered 2017-06-26: 10 mg via INTRAVENOUS
  Filled 2017-06-26: qty 2

## 2017-06-26 MED ORDER — SODIUM CHLORIDE 0.9 % IV BOLUS
500.0000 mL | Freq: Once | INTRAVENOUS | Status: AC
  Administered 2017-06-26: 500 mL via INTRAVENOUS

## 2017-06-26 NOTE — ED Notes (Signed)
ED Provider at bedside. 

## 2017-06-26 NOTE — ED Provider Notes (Signed)
[facility] Emergency Department Provider Note ____________________________________________   First MD Initiated Contact with Patient 06/26/17 1554     (approximate)  I have reviewed the triage vital signs and the nursing notes.   HISTORY  Chief Complaint Nausea and Dizziness    HPI [name] [name] is a 84 y.o. female with PMH as noted below who presents with dizziness, acute onset yesterday evening, described as lightheadedness, persistent today, and associated with nausea but no vomiting, as well as frontal headache since last night.  Patient denies prior history of similar headaches.  She states that before yesterday evening she was in her usual state of health.  She denies abdominal pain, chest pain or difficulty breathing, or fever.   Past Medical History:  Diagnosis Date  . Anemia   . COPD (chronic obstructive pulmonary disease) ([place])   . Diverticulitis   . GERD (gastroesophageal reflux disease)   . Glaucoma   . HLD (hyperlipidemia)   . Hypertension   . Iron deficiency   . Rectal bleeding     Patient Active Problem List   Diagnosis Date Noted  . Fibromyositis 06/12/2017  . Pain 06/12/2017  . Healthcare maintenance 01/17/2017  . Anemia 05/30/2016  . Angiodysplasia of intestinal tract   . Blood in stool   . Benign neoplasm of ascending colon   . UGIB (upper gastrointestinal bleed)   . GIB (gastrointestinal bleeding) 04/12/2016  . Melena   . Healthcare-associated pertussis 10/10/2015  . Healthcare-associated pneumonia 10/10/2015  . GI bleed 08/10/2015  . Iron deficiency anemia due to chronic blood loss 08/10/2015  . HTN (hypertension), benign 08/10/2015  . Other and unspecified hyperlipidemia 08/10/2015  . GERD (gastroesophageal reflux disease) 08/10/2015  . Chronic obstructive pulmonary disease ([place]) 08/10/2015  . Glaucoma suspect 12/26/2012  . Fatigue 02/12/2012  . H/O total hip arthroplasty 01/25/2012  . Total knee  replacement status 01/25/2012  . Arthritis of knee 01/18/2012  . ARMD (age related macular degeneration) 11/21/2011  . Foot drop 11/15/2011  . Hallux valgus 11/15/2011  . Hammer toe, acquired 11/15/2011  . Recurrent major depressive disorder, in partial remission ([place]) 07/10/2011  . Degenerative disc disease 04/11/2011  . Fibromyalgia 02/10/2011  . Diverticulosis of colon 07/18/2010  . Gastric ulcer 07/18/2010  . Hereditary and idiopathic peripheral neuropathy 07/18/2010  . Meniere's disease 07/18/2010  . Osteoarthritis, generalized 07/18/2010  . Hemorrhoids 07/18/2010    Past Surgical History:  Procedure Laterality Date  . ABDOMINAL HYSTERECTOMY    . APPENDECTOMY    . COLONOSCOPY WITH PROPOFOL N/A 04/14/2016   Procedure: COLONOSCOPY WITH PROPOFOL;  Surgeon: Lucilla Lame, MD;  Location: [facility] ENDOSCOPY;  Service: Endoscopy;  Laterality: N/A;  . ENTEROSCOPY N/A 05/10/2016   Procedure: Push ENTEROSCOPY with pediatric colonoscope;  Surgeon: [name], MD;  Location: [facility] ENDOSCOPY;  Service: Endoscopy;  Laterality: N/A;  . ENTEROSCOPY N/A 05/30/2016   Procedure: push ENTEROSCOPY;  Surgeon: [name], MD;  Location: [facility] ENDOSCOPY;  Service: Endoscopy;  Laterality: N/A;  . ESOPHAGOGASTRODUODENOSCOPY (EGD) WITH PROPOFOL N/A 08/12/2015   Procedure: ESOPHAGOGASTRODUODENOSCOPY (EGD) WITH PROPOFOL;  Surgeon: Lollie Sails, MD;  Location: [facility] ENDOSCOPY;  Service: Endoscopy;  Laterality: N/A;  . ESOPHAGOGASTRODUODENOSCOPY (EGD) WITH PROPOFOL N/A 04/03/2016   Procedure: ESOPHAGOGASTRODUODENOSCOPY (EGD) WITH PROPOFOL;  Surgeon: Lucilla Lame, MD;  Location: [facility] ENDOSCOPY;  Service: Endoscopy;  Laterality: N/A;  . GIVENS CAPSULE STUDY N/A 04/28/2016   Procedure: GIVENS CAPSULE STUDY;  Surgeon: [name], MD;  Location: [facility] ENDOSCOPY;  Service: Endoscopy;  Laterality:  N/A;  . JOINT REPLACEMENT      Prior to Admission medications   Medication Sig Start Date End Date Taking? Authorizing Provider    acetaminophen (TYLENOL) 650 MG CR tablet Take 650 mg by mouth every 8 (eight) hours as needed for pain.    [provider]  albuterol (PROVENTIL HFA;VENTOLIN HFA) 108 (90 Base) MCG/ACT inhaler Inhale 2 puffs into the lungs every 4 (four) hours as needed for wheezing or shortness of breath.    [provider]  chlorthalidone (HYGROTON) 25 MG tablet Take 12.5 mg by mouth daily.  01/18/16   [provider]  Coenzyme Q10 10 MG capsule Take by mouth.    [provider]  COENZYME Q10 PO Take 1 tablet by mouth daily.    [provider]  colchicine 0.6 MG tablet Take 0.6 mg by mouth daily.  08/28/13   [provider]  Cyanocobalamin (B-12) 1000 MCG CAPS Take 1,000 Units by mouth 2 (two) times daily.     [provider]  cyanocobalamin ([employer] VITAMIN B12) 1000 MCG tablet Take by mouth.    [provider]  cycloSPORINE (RESTASIS) 0.05 % ophthalmic emulsion Place 1 drop into both eyes 2 (two) times daily.    [provider]  fluticasone (FLONASE) 50 MCG/ACT nasal spray fluticasone propionate 50 mcg/actuation nasal spray,suspension    [provider]  fluticasone (FLOVENT HFA) 110 MCG/ACT inhaler Inhale 1 puff into the lungs 2 (two) times daily.  01/12/16 12/26/16  [provider]  gabapentin (NEURONTIN) 300 MG capsule Take 300 mg by mouth 2 (two) times daily.    [provider]  guaiFENesin (MUCINEX) 600 MG 12 hr tablet Take 600 mg by mouth 2 (two) times daily as needed.    [provider]  hydrocortisone (PROCTOSOL HC) 2.5 % rectal cream PLACE RECTALLY TWICE A DAY 10/13/15   [provider]  latanoprost (XALATAN) 0.005 % ophthalmic solution Apply to eye. 09/28/16 09/28/17  [provider]  loratadine (CLARITIN) 10 MG tablet Take 10 mg by mouth daily as needed for allergies.    [provider]  losartan (COZAAR) 50 MG tablet Take 50 mg by mouth daily.    [provider]  menthol-zinc oxide (GOLD BOND) powder Apply topically once daily as needed (PRN under breasts for yeast).    [provider]  mometasone (ELOCON) 0.1 % lotion Apply topically.    [provider]  Multiple Vitamins-Minerals (OCUVITE PRESERVISION PO) Take 1 tablet by mouth 2 (two) times daily.    [provider]  Multiple Vitamins-Minerals (OCUVITE PRESERVISION PO) Take by mouth.    [provider]  PARoxetine (PAXIL) 40 MG tablet Take 40 mg by mouth every morning.    [provider]  Polyethyl Glycol-Propyl Glycol (SYSTANE) 0.4-0.3 % SOLN Apply 1 drop to eye as needed.    [provider]  potassium chloride (K-DUR) 10 MEQ tablet Take by mouth. 04/30/17   [provider]  potassium chloride (K-DUR,KLOR-CON) 10 MEQ tablet Take 10 mEq by mouth 2 (two) times daily.     [provider]  PROPYLENE GLYCOL-GLYCERIN OP Apply to eye.    [provider]  RABEprazole (ACIPHEX) 20 MG tablet Take 20 mg by mouth 2 (two) times daily.    [provider]  Spacer/Aero-Holding Chambers (E-Z SPACER) inhaler Use as instructed. 11/05/15   [provider]  sucralfate (CARAFATE) 1 g tablet Take 1 tablet (1 g total) by mouth 4 (four) times  daily -  with meals and at bedtime. 08/13/15   Dustin Flock, MD  Tetrahydroz-Dextran-PEG-Povid (EYE DROPS ADVANCED RELIEF OP) Apply to eye.    [provider]  timolol (TIMOPTIC-XR) 0.5 % ophthalmic gel-forming Place 1 drop into both eyes daily.    [provider]  tiZANidine (ZANAFLEX) 4 MG tablet Take by mouth. 01/23/17 01/23/18  [provider]  valACYclovir (VALTREX) 1000 MG tablet once daily as needed. Reported on 03/22/2015 01/18/13   [provider]    Allergies Nsaids; Codeine; Dextrans; Fentanyl; Lipitor [atorvastatin]; Lovastatin; Mobic [meloxicam]; Niacin and related; Other; Polysaccharide k; Pravachol [pravastatin sodium]; Statins; Tolmetin;  Voltaren [diclofenac sodium]; Zetia [ezetimibe]; and Niacin  Family History  Problem Relation Age of Onset  . CAD Mother   . CAD Father   . CAD Brother     Social History Social History   Tobacco Use  . Smoking status: Former Smoker    Types: Cigarettes  . Smokeless tobacco: Never Used  Substance Use Topics  . Alcohol use: No  . Drug use: No    Review of Systems  Constitutional: No fever. Eyes: Positive for mild blurred vision. ENT: No sore throat. Cardiovascular: Denies chest pain. Respiratory: Denies shortness of breath. Gastrointestinal: Positive for nausea.  Genitourinary: Negative for dysuria.  Musculoskeletal: Negative for back pain. Skin: Negative for rash. Neurological: Positive for headache.  ____________________________________________   PHYSICAL EXAM:  VITAL SIGNS: ED Triage Vitals  Enc Vitals Group     BP 06/26/17 1525 (!) 133/104     Pulse Rate 06/26/17 1525 70     Resp 06/26/17 1525 18     Temp 06/26/17 1525 98.2 F (36.8 C)     Temp Source 06/26/17 1525 Oral     SpO2 06/26/17 1525 97 %     Weight 06/26/17 1526 186 lb (84.4 kg)     Height 06/26/17 1526 5\' 8"  (1.727 m)     Head Circumference --      Peak Flow --      Pain Score 06/26/17 1525 5     Pain Loc --      Pain Edu? --      Excl. in [place]? --     Constitutional: Alert and oriented. Well appearing and in no acute distress. Eyes: Conjunctivae are normal.  EOMI.  PERRLA. Head: Atraumatic. Nose: No congestion/rhinnorhea. Mouth/Throat: Mucous membranes are moist.   Neck: Normal range of motion.  Cardiovascular: Normal rate, regular rhythm. Grossly normal heart sounds.  Good peripheral circulation. Respiratory: Normal respiratory effort.  No retractions. Lungs CTAB. Gastrointestinal: Soft and nontender. No distention.  Genitourinary: No flank tenderness. Musculoskeletal: No lower extremity edema.  Extremities warm and well perfused.  Neurologic:  Normal speech and language.  Cranial  nerves III through XII intact.  Motor and sensory intact in all extremities.  Normal coordination.  No gross focal neurologic deficits are appreciated.  Skin:  Skin is warm and dry. No rash noted. Psychiatric: Mood and affect are normal. Speech and behavior are normal.  ____________________________________________   LABS (all labs ordered are listed, but only abnormal results are displayed)  Labs Reviewed  BASIC METABOLIC PANEL - Abnormal; Notable for the following components:      Result Value   Sodium 134 (*)    Chloride 97 (*)    All other components within normal limits  CBC - Abnormal; Notable for the following components:   Hemoglobin 11.7 (*)    RDW 22.7 (*)    All other components  within normal limits  URINALYSIS, COMPLETE (UACMP) WITH MICROSCOPIC - Abnormal; Notable for the following components:   Color, Urine STRAW (*)    APPearance CLEAR (*)    Bacteria, UA MANY (*)    All other components within normal limits  TROPONIN I  CBG MONITORING, ED   ____________________________________________  EKG  ED ECG REPORT I, Arta Silence, the attending physician, personally viewed and interpreted this ECG.  Date: 06/26/2017 EKG Time: 1531 Rate: 64 Rhythm: normal sinus rhythm QRS Axis: normal Intervals: normal ST/T Wave abnormalities: normal Narrative Interpretation: no evidence of acute ischemia  ____________________________________________  RADIOLOGY  CT head: No ICH or other acute findings  ____________________________________________   PROCEDURES  Procedure(s) performed: No  Procedures  Critical Care performed: No ____________________________________________   INITIAL IMPRESSION / ASSESSMENT AND PLAN / ED COURSE  Pertinent labs & imaging results that were available during my care of the patient were reviewed by me and considered in my medical decision making (see chart for details).  84 year old female with PMH as noted above currently  undergoing work-up for iron deficiency anemia presents with lightheadedness, headache, and nausea since yesterday.  I reviewed the past medical records in Epic; the patient was most recently admitted approximately 1 year ago for upper GI bleed.  On exam, the patient is extremely well-appearing for age, vital signs are normal except for minimal hypertension, and the remainder of the exam is as described above.  The neuro exam is nonfocal.  Differential includes migraine, dehydration or other metabolic etiology, anemia, viral syndrome, or less likely SAH or other acute CNS cause.  There is no clinical evidence for stroke.  Plan: Basic and cardiac labs, CT head, IV fluids, Reglan for symptomatic treatment, and reassess.  Clinical Course as of Jun 27 1847  Tue Jun 26, 2017  1608 Hemoglobin(!): 11.7 [SS]    Clinical Course User Index [SS] Arta Silence, MD   ----------------------------------------- 6:48 PM on 06/26/2017 -----------------------------------------  CT head shows no acute findings.  Patient's lab work-up and UA are unremarkable.  Her symptoms have resolved after fluids and Reglan.  She feels much better and would like to go home.  I discussed the results of the work-up with her.  Return precautions given, and she expresses understanding. ____________________________________________   FINAL CLINICAL IMPRESSION(S) / ED DIAGNOSES  Final diagnoses:  Dizziness  Acute nonintractable headache, unspecified headache type      NEW MEDICATIONS STARTED DURING THIS VISIT:  New Prescriptions   No medications on file     Note:  This document was prepared using Dragon voice recognition software and may include unintentional dictation errors.     Arta Silence, MD 06/26/17 [phone]

## 2017-06-26 NOTE — ED Notes (Signed)
Patient transported to CT 

## 2017-06-26 NOTE — ED Triage Notes (Signed)
Pt states symptoms began yesterday afternoon. HA, states has blurred vision but can only see out of one eye d/t macular degeneration, N, dizzy. Was told BP was "real high." denies vomiting. States she feels unstable. Has hx of HTN. Neuro exam clear in triage. Speech clear.   States last week was prepping for endoscopy so she took a pill d/t hx of GI bleed.

## 2017-06-26 NOTE — Progress Notes (Signed)
Wed she started prep for video capsule and saw black stools and then after wards she started seeing black stools again, very fatigues easily

## 2017-06-26 NOTE — Discharge Instructions (Signed)
Return to the ER for new, worsening, persistent severe headache, dizziness, weakness, vomiting, difficulty breathing, fevers, or any other new or worsening symptoms that concern you.

## 2017-06-27 ENCOUNTER — Encounter: Payer: Self-pay | Admitting: Gastroenterology

## 2017-06-28 NOTE — Progress Notes (Signed)
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 She has been unable to tolerate oral iron supplements due to abdominal cramping and diarrhea. She has required several blood transfusions in the past and received regular IV iron infusions. She has tolerated these without complications. Iron and blood transfusions have been given on a palliative basis with a goal hemoglobin of >10.   March 2018 she underwent an EGD and colonoscopy which did not reveal source of bleeding.  May 2/18 - enteroscopy revealed 3 avms which were ablated. She suffered melena and had a hemoglobin of 6.7 and was hospitalized. At that time, enteroscopy was repeated which was normal. She received a blood transfusion and her hemoglobin was 9.6.   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   08/07/2016 Capsule study showed active bleeding from single spot in proximal jejunum. She underwent push enteroscopy with argon laser therapy but no bleeding was found.     Interval history- Patient does not feel well today. Reports headaches, nausea and dizzienss since yesterday. She feels like she is going to pass out. Reports having black stools since her capsule study a few days ago  ECOG PS- 2 Pain scale- 0   Review of systems- Review of Systems  Constitutional: Positive for malaise/fatigue. Negative for chills, fever and weight loss.   HENT: Negative for congestion, ear discharge and nosebleeds.   Eyes: Negative for blurred vision.  Respiratory: Negative for cough, hemoptysis, sputum production, shortness of breath and wheezing.   Cardiovascular: Negative for chest pain, palpitations, orthopnea and claudication.  Gastrointestinal: Positive for nausea. Negative for abdominal pain, blood in stool, constipation, diarrhea, heartburn, melena and vomiting.  Genitourinary: Negative for dysuria, flank pain, frequency, hematuria and urgency.  Musculoskeletal: Negative for back pain, joint pain and myalgias.  Skin: Negative for rash.  Neurological: Positive for dizziness and headaches. Negative for tingling, focal weakness, seizures and weakness.  Endo/Heme/Allergies: Does not bruise/bleed easily.  Psychiatric/Behavioral: Negative for depression and suicidal ideas. The patient does not have insomnia.       Allergies  Allergen Reactions  . Nsaids Hives  . Codeine Nausea And Vomiting  . Dextrans Hives  . Fentanyl Itching  . Lipitor [Atorvastatin] Other (See Comments)    Reaction: Muscle pain  . Lovastatin   . Mobic [Meloxicam] Other (See Comments)    Reaction: Mouth and tongue ulcers  . Niacin And Related Itching  . Other   . Polysaccharide K Hives  . Pravachol [Pravastatin Sodium] Other (See Comments)    Reaction: Muscle pain  . Statins Other (See Comments)  . Tolmetin Hives  . Voltaren [Diclofenac Sodium] Other (See Comments)    Reaction: Mouth and tongue ulcers  . Zetia [Ezetimibe] Other (See Comments)    Reaction: Leg pain  . Niacin Itching     Past Medical History:  Diagnosis Date  . Anemia   . COPD (chronic obstructive pulmonary disease) ([place])   . Diverticulitis   . GERD (gastroesophageal  reflux disease)   . Glaucoma   . HLD (hyperlipidemia)   . Hypertension   . Iron deficiency   . Rectal bleeding      Past Surgical History:  Procedure Laterality Date  . ABDOMINAL HYSTERECTOMY    . APPENDECTOMY     . COLONOSCOPY WITH PROPOFOL N/A 04/14/2016   Procedure: COLONOSCOPY WITH PROPOFOL;  Surgeon: Lucilla Lame, MD;  Location: [facility] ENDOSCOPY;  Service: Endoscopy;  Laterality: N/A;  . ENTEROSCOPY N/A 05/10/2016   Procedure: Push ENTEROSCOPY with pediatric colonoscope;  Surgeon: [name], MD;  Location[facility] ENDOSCOPY;  Service: Endoscopy;  Laterality: N/A;  . ENTEROSCOPY N/A 05/30/2016   Procedure: push ENTEROSCOPY;  Surgeon: [name], MD;  Location: [facility] ENDOSCOPY;  Service: Endoscopy;  Laterality: N/A;  . ESOPHAGOGASTRODUODENOSCOPY (EGD) WITH PROPOFOL N/A 08/12/2015   Procedure: ESOPHAGOGASTRODUODENOSCOPY (EGD) WITH PROPOFOL;  Surgeon: Lollie Sails, MD;  Location: [facility] ENDOSCOPY;  Service: Endoscopy;  Laterality: N/A;  . ESOPHAGOGASTRODUODENOSCOPY (EGD) WITH PROPOFOL N/A 04/03/2016   Procedure: ESOPHAGOGASTRODUODENOSCOPY (EGD) WITH PROPOFOL;  Surgeon: Lucilla Lame, MD;  Location: [facility] ENDOSCOPY;  Service: Endoscopy;  Laterality: N/A;  . GIVENS CAPSULE STUDY N/A 04/28/2016   Procedure: GIVENS CAPSULE STUDY;  Surgeon: [name], MD;  Location: [facility] ENDOSCOPY;  Service: Endoscopy;  Laterality: N/A;  . GIVENS CAPSULE STUDY N/A 06/21/2017   Procedure: GIVENS CAPSULE STUDY;  Surgeon: [name], MD;  Location: [facility] ENDOSCOPY;  Service: Gastroenterology;  Laterality: N/A;  . JOINT REPLACEMENT      Social History   Socioeconomic History  . Marital status: Widowed    Spouse name: Not on file  . Number of children: Not on file  . Years of education: Not on file  . Highest education level: Not on file  Occupational History  . Not on file  Social Needs  . Financial resource strain: Not on file  . Food insecurity:    Worry: Not on file    Inability: Not on file  . Transportation needs:    Medical: Not on file    Non-medical: Not on file  Tobacco Use  . Smoking status: Former Smoker    Types: Cigarettes  . Smokeless tobacco: Never Used  Substance and Sexual Activity  . Alcohol use: No  .  Drug use: No  . Sexual activity: Not on file  Lifestyle  . Physical activity:    Days per week: Not on file    Minutes per session: Not on file  . Stress: Not on file  Relationships  . Social connections:    Talks on phone: Not on file    Gets together: Not on file    Attends religious service: Not on file    Active member of club or organization: Not on file    Attends meetings of clubs or organizations: Not on file    Relationship status: Not on file  . Intimate partner violence:    Fear of current or ex partner: Not on file    Emotionally abused: Not on file    Physically abused: Not on file    Forced sexual activity: Not on file  Other Topics Concern  . Not on file  Social History Narrative  . Not on file    Family History  Problem Relation Age of Onset  . CAD Mother   . CAD Father   . CAD Brother      Current Outpatient Medications:  .  acetaminophen (TYLENOL) 650 MG CR tablet, Take 650 mg by mouth every 8 (  eight) hours as needed for pain., Disp: , Rfl:  .  albuterol (PROVENTIL HFA;VENTOLIN HFA) 108 (90 Base) MCG/ACT inhaler, Inhale 2 puffs into the lungs every 4 (four) hours as needed for wheezing or shortness of breath., Disp: , Rfl:  .  chlorthalidone (HYGROTON) 25 MG tablet, Take 12.5 mg by mouth daily. , Disp: , Rfl:  .  Coenzyme Q10 10 MG capsule, Take by mouth., Disp: , Rfl:  .  COENZYME Q10 PO, Take 1 tablet by mouth daily., Disp: , Rfl:  .  Cyanocobalamin (B-12) 1000 MCG CAPS, Take 1,000 Units by mouth 2 (two) times daily. , Disp: , Rfl:  .  cycloSPORINE (RESTASIS) 0.05 % ophthalmic emulsion, Place 1 drop into both eyes 2 (two) times daily., Disp: , Rfl:  .  fluticasone (FLONASE) 50 MCG/ACT nasal spray, fluticasone propionate 50 mcg/actuation nasal spray,suspension, Disp: , Rfl:  .  gabapentin (NEURONTIN) 300 MG capsule, Take 300 mg by mouth 2 (two) times daily., Disp: , Rfl:  .  guaiFENesin (MUCINEX) 600 MG 12 hr tablet, Take 600 mg by mouth 2 (two) times  daily as needed., Disp: , Rfl:  .  hydrocortisone (PROCTOSOL HC) 2.5 % rectal cream, PLACE RECTALLY TWICE A DAY, Disp: , Rfl:  .  latanoprost (XALATAN) 0.005 % ophthalmic solution, Apply to eye., Disp: , Rfl:  .  losartan (COZAAR) 50 MG tablet, Take 50 mg by mouth daily., Disp: , Rfl:  .  menthol-zinc oxide (GOLD BOND) powder, Apply topically once daily as needed (PRN under breasts for yeast)., Disp: , Rfl:  .  Multiple Vitamins-Minerals (OCUVITE PRESERVISION PO), Take 1 tablet by mouth 2 (two) times daily., Disp: , Rfl:  .  PARoxetine (PAXIL) 40 MG tablet, Take 40 mg by mouth every morning., Disp: , Rfl:  .  Polyethyl Glycol-Propyl Glycol (SYSTANE) 0.4-0.3 % SOLN, Apply 1 drop to eye as needed., Disp: , Rfl:  .  potassium chloride (K-DUR,KLOR-CON) 10 MEQ tablet, Take 10 mEq by mouth 2 (two) times daily. , Disp: , Rfl:  .  PROPYLENE GLYCOL-GLYCERIN OP, Apply to eye., Disp: , Rfl:  .  RABEprazole (ACIPHEX) 20 MG tablet, Take 20 mg by mouth 2 (two) times daily., Disp: , Rfl:  .  Spacer/Aero-Holding Chambers (E-Z SPACER) inhaler, Use as instructed., Disp: , Rfl:  .  sucralfate (CARAFATE) 1 g tablet, Take 1 tablet (1 g total) by mouth 4 (four) times daily -  with meals and at bedtime., Disp: 120 tablet, Rfl: 1 .  Tetrahydroz-Dextran-PEG-Povid (EYE DROPS ADVANCED RELIEF OP), Apply to eye., Disp: , Rfl:  .  timolol (TIMOPTIC-XR) 0.5 % ophthalmic gel-forming, Place 1 drop into both eyes daily., Disp: , Rfl:  .  tiZANidine (ZANAFLEX) 4 MG tablet, Take by mouth., Disp: , Rfl:  .  valACYclovir (VALTREX) 1000 MG tablet, once daily as needed. Reported on 03/22/2015, Disp: , Rfl:  .  colchicine 0.6 MG tablet, Take 0.6 mg by mouth daily. , Disp: , Rfl:  .  cyanocobalamin ([employer] VITAMIN B12) 1000 MCG tablet, Take by mouth., Disp: , Rfl:  .  fluticasone (FLOVENT HFA) 110 MCG/ACT inhaler, Inhale 1 puff into the lungs 2 (two) times daily. , Disp: , Rfl:  .  loratadine (CLARITIN) 10 MG tablet, Take 10 mg by mouth  daily as needed for allergies., Disp: , Rfl:  .  mometasone (ELOCON) 0.1 % lotion, Apply topically., Disp: , Rfl:  .  Multiple Vitamins-Minerals (OCUVITE PRESERVISION PO), Take by mouth., Disp: , Rfl:  .  potassium chloride (  K-DUR) 10 MEQ tablet, Take by mouth., Disp: , Rfl:   Physical exam:  Vitals:   06/26/17 1447  BP: (!) 167/89  Pulse: 71  Resp: 18  Temp: 98.1 F (36.7 C)  TempSrc: Oral  Weight: 186 lb (84.4 kg)  Height: 5\' 9"  (1.753 m)   Physical Exam  Constitutional: She is oriented to person, place, and time. She appears well-developed and well-nourished.  She is sitting in a wheelchair. She appears to be uncomfortable due to nausea  HENT:  Head: Normocephalic and atraumatic.  Eyes: Pupils are equal, round, and reactive to light. EOM are normal.  Neck: Normal range of motion.  Cardiovascular: Normal rate, regular rhythm and normal heart sounds.  Pulmonary/Chest: Effort normal and breath sounds normal.  Abdominal: Soft. Bowel sounds are normal.  Neurological: She is alert and oriented to person, place, and time.  Skin: Skin is warm and dry.     CMP Latest Ref Rng & Units 06/26/2017  Glucose 65 - 99 mg/dL 93  BUN 6 - 20 mg/dL 20  Creatinine 0.44 - 1.00 mg/dL 0.68  Sodium 135 - 145 mmol/L 134(L)  Potassium 3.5 - 5.1 mmol/L 3.8  Chloride 101 - 111 mmol/L 97(L)  CO2 22 - 32 mmol/L 26  Calcium 8.9 - 10.3 mg/dL 9.3  Total Protein 6.5 - 8.1 g/dL -  Total Bilirubin 0.3 - 1.2 mg/dL -  Alkaline Phos 38 - 126 U/L -  AST 15 - 41 U/L -  ALT 14 - 54 U/L -   CBC Latest Ref Rng & Units 06/26/2017  WBC 3.6 - 11.0 K/uL 7.6  Hemoglobin 12.0 - 16.0 g/dL 11.7(L)  Hematocrit 35.0 - 47.0 % 35.1  Platelets 150 - 440 K/uL 295    No images are attached to the encounter.  Ct Head Wo Contrast  Result Date: 06/26/2017 CLINICAL DATA:  Acute headache, severe, worst of life. Hypertension. EXAM: CT HEAD WITHOUT CONTRAST TECHNIQUE: Contiguous axial images were obtained from the base of the  skull through the vertex without intravenous contrast. COMPARISON:  None. FINDINGS: Brain: No evidence of acute infarction, hemorrhage, hydrocephalus, extra-axial collection or mass lesion/mass effect. Patchy low-density in the cerebral white matter attributed to chronic small vessel ischemia. Mild for age cerebral volume loss. Vascular: No hyperdense vessel.  Atherosclerotic calcification. Skull: Negative Sinuses/Orbits: Bilateral cataract resection.  No acute finding. IMPRESSION: No acute finding. Electronically Signed   By: Monte Fantasia M.D.   On: 06/26/2017 16:26     Assessment and plan- Patient is a 84 y.o. female with iron deficiency anemia secondary to bleeding AVM here for routine f/u   H/h is stable around 11. Iron studies are normal. She does not require IV iron at this time. She has been needing IV iron roughly Q2-3 months.   Given her ongoing black stools for last few days- I will repeat cbc in 2 weeks  I will see her back in 3 months with cbc ferritin and iron studies  She will going to ER given her symptoms of nausea, headache and lightheadedness     Visit Diagnosis 1. Iron deficiency anemia due to chronic blood loss      Dr. Randa Evens, MD, MPH [facility] at [facility] [phone] 06/28/2017 7:58 AM

## 2017-07-04 ENCOUNTER — Other Ambulatory Visit: Payer: Self-pay

## 2017-07-04 ENCOUNTER — Telehealth: Payer: Self-pay

## 2017-07-04 DIAGNOSIS — K922 Gastrointestinal hemorrhage, unspecified: Secondary | ICD-10-CM

## 2017-07-04 NOTE — Telephone Encounter (Signed)
Advised patient of capsule study results per Dr. Vicente Males.  - shows blood in the stomach. - Dr. Vicente Males requesting urgent EGD on 6/27   [name] [name] expressed understanding.  She's scheduled for EGD tomorrow at 815am.

## 2017-07-05 ENCOUNTER — Telehealth: Payer: Self-pay | Admitting: Gastroenterology

## 2017-07-05 ENCOUNTER — Ambulatory Visit: Payer: Medicare Other | Admitting: Anesthesiology

## 2017-07-05 ENCOUNTER — Ambulatory Visit
Admission: RE | Admit: 2017-07-05 | Discharge: 2017-07-05 | Disposition: A | Discharge status: Home / Self Care | Payer: Medicare Other | Source: Ambulatory Visit | Attending: Gastroenterology | Admitting: Gastroenterology

## 2017-07-05 ENCOUNTER — Encounter: Payer: Self-pay | Admitting: *Deleted

## 2017-07-05 ENCOUNTER — Encounter
Admission: RE | Disposition: A | Discharge status: Home / Self Care | Payer: Self-pay | Source: Ambulatory Visit | Attending: Gastroenterology

## 2017-07-05 ENCOUNTER — Other Ambulatory Visit: Payer: Self-pay

## 2017-07-05 DIAGNOSIS — Z888 Allergy status to other drugs, medicaments and biological substances status: Secondary | ICD-10-CM | POA: Diagnosis not present

## 2017-07-05 DIAGNOSIS — E785 Hyperlipidemia, unspecified: Secondary | ICD-10-CM | POA: Insufficient documentation

## 2017-07-05 DIAGNOSIS — Z8711 Personal history of peptic ulcer disease: Secondary | ICD-10-CM | POA: Insufficient documentation

## 2017-07-05 DIAGNOSIS — Z87891 Personal history of nicotine dependence: Secondary | ICD-10-CM | POA: Insufficient documentation

## 2017-07-05 DIAGNOSIS — Z886 Allergy status to analgesic agent status: Secondary | ICD-10-CM | POA: Insufficient documentation

## 2017-07-05 DIAGNOSIS — K922 Gastrointestinal hemorrhage, unspecified: Secondary | ICD-10-CM

## 2017-07-05 DIAGNOSIS — Z885 Allergy status to narcotic agent status: Secondary | ICD-10-CM | POA: Insufficient documentation

## 2017-07-05 DIAGNOSIS — Z8249 Family history of ischemic heart disease and other diseases of the circulatory system: Secondary | ICD-10-CM | POA: Insufficient documentation

## 2017-07-05 DIAGNOSIS — K921 Melena: Secondary | ICD-10-CM | POA: Diagnosis present

## 2017-07-05 DIAGNOSIS — F329 Major depressive disorder, single episode, unspecified: Secondary | ICD-10-CM | POA: Insufficient documentation

## 2017-07-05 DIAGNOSIS — Z8719 Personal history of other diseases of the digestive system: Secondary | ICD-10-CM | POA: Insufficient documentation

## 2017-07-05 DIAGNOSIS — K219 Gastro-esophageal reflux disease without esophagitis: Secondary | ICD-10-CM | POA: Diagnosis not present

## 2017-07-05 DIAGNOSIS — K31819 Angiodysplasia of stomach and duodenum without bleeding: Secondary | ICD-10-CM

## 2017-07-05 DIAGNOSIS — I10 Essential (primary) hypertension: Secondary | ICD-10-CM | POA: Diagnosis not present

## 2017-07-05 DIAGNOSIS — Z79899 Other long term (current) drug therapy: Secondary | ICD-10-CM | POA: Insufficient documentation

## 2017-07-05 DIAGNOSIS — M797 Fibromyalgia: Secondary | ICD-10-CM | POA: Diagnosis not present

## 2017-07-05 DIAGNOSIS — J449 Chronic obstructive pulmonary disease, unspecified: Secondary | ICD-10-CM | POA: Diagnosis not present

## 2017-07-05 DIAGNOSIS — M1990 Unspecified osteoarthritis, unspecified site: Secondary | ICD-10-CM | POA: Insufficient documentation

## 2017-07-05 HISTORY — PX: ENTEROSCOPY: SHX5533

## 2017-07-05 SURGERY — ENTEROSCOPY
Anesthesia: General

## 2017-07-05 MED ORDER — LIDOCAINE HCL (CARDIAC) PF 100 MG/5ML IV SOSY
PREFILLED_SYRINGE | INTRAVENOUS | Status: DC | PRN
  Administered 2017-07-05: 50 mg via INTRAVENOUS

## 2017-07-05 MED ORDER — PROPOFOL 10 MG/ML IV BOLUS
INTRAVENOUS | Status: DC | PRN
  Administered 2017-07-05: 40 mg via INTRAVENOUS
  Administered 2017-07-05 (×10): 10 mg via INTRAVENOUS

## 2017-07-05 MED ORDER — PROPOFOL 10 MG/ML IV BOLUS
INTRAVENOUS | Status: AC
  Filled 2017-07-05: qty 20

## 2017-07-05 MED ORDER — LIDOCAINE HCL (PF) 2 % IJ SOLN
INTRAMUSCULAR | Status: AC
  Filled 2017-07-05: qty 10

## 2017-07-05 MED ORDER — SODIUM CHLORIDE 0.9 % IV SOLN
INTRAVENOUS | Status: DC
  Administered 2017-07-05: 1000 mL via INTRAVENOUS

## 2017-07-05 MED ORDER — OMEPRAZOLE 40 MG PO CPDR: 40.0000 mg | DELAYED_RELEASE_CAPSULE | Freq: Every day | ORAL | 3 refills | Status: DC

## 2017-07-05 NOTE — Anesthesia Procedure Notes (Signed)
Procedure Name: MAC Date/Time: 07/05/2017 9:04 AM Performed by: Rudean Hitt, CRNA Pre-anesthesia Checklist: Patient identified, Emergency Drugs available, Suction available, Patient being monitored and Timeout performed Patient Re-evaluated:Patient Re-evaluated prior to induction Oxygen Delivery Method: Nasal cannula Induction Type: IV induction

## 2017-07-05 NOTE — Anesthesia Postprocedure Evaluation (Signed)
Anesthesia Post Note  Patient: [name] [name]  Procedure(s) Performed: ENTEROSCOPY (N/A )  Patient location during evaluation: Endoscopy Anesthesia Type: General Level of consciousness: awake and alert Pain management: pain level controlled Vital Signs Assessment: post-procedure vital signs reviewed and stable Respiratory status: spontaneous breathing, nonlabored ventilation, respiratory function stable and patient connected to nasal cannula oxygen Cardiovascular status: blood pressure returned to baseline and stable Postop Assessment: no apparent nausea or vomiting Anesthetic complications: no     Last Vitals:  Vitals:   07/05/17 0933 07/05/17 0943  BP: (!) 162/84 (!) 172/74  Pulse: 75 72  Resp: 13 11  Temp:    SpO2: 100% 100%    Last Pain:  Vitals:   07/05/17 0943  TempSrc:   PainSc: 0-No pain                 Martha Clan

## 2017-07-05 NOTE — Anesthesia Post-op Follow-up Note (Signed)
Anesthesia QCDR form completed.        

## 2017-07-05 NOTE — Anesthesia Preprocedure Evaluation (Signed)
Anesthesia Evaluation  Patient identified by MRN, date of birth, ID band Patient awake    Reviewed: Allergy & Precautions, NPO status , Patient's Chart, lab work & pertinent test results, reviewed documented beta blocker date and time   History of Anesthesia Complications Negative for: history of anesthetic complications  Airway Mallampati: III  TM Distance: >3 FB     Dental  (+) Chipped   Pulmonary neg shortness of breath, pneumonia, resolved, COPD, Recent URI , Residual Cough, former smoker,    Pulmonary exam normal        Cardiovascular Exercise Tolerance: Good hypertension, Pt. on medications and Pt. on home beta blockers Normal cardiovascular exam     Neuro/Psych PSYCHIATRIC DISORDERS Depression  Neuromuscular disease    GI/Hepatic Neg liver ROS, PUD, GERD  ,  Endo/Other  negative endocrine ROS  Renal/GU negative Renal ROS     Musculoskeletal  (+) Arthritis , Osteoarthritis,  Fibromyalgia -  Abdominal   Peds  Hematology  (+) anemia ,   Anesthesia Other Findings Past Medical History: No date: Anemia No date: COPD (chronic obstructive pulmonary disease) ([facility]) No date: Diverticulitis No date: GERD (gastroesophageal reflux disease) No date: Glaucoma No date: HLD (hyperlipidemia) No date: Hypertension No date: Iron deficiency No date: Rectal bleeding   Reproductive/Obstetrics                             Anesthesia Physical  Anesthesia Plan  ASA: III  Anesthesia Plan: General   Post-op Pain Management:    Induction: Intravenous  PONV Risk Score and Plan: Propofol infusion  Airway Management Planned: Nasal Cannula  Additional Equipment:   Intra-op Plan:   Post-operative Plan:   Informed Consent: I have reviewed the patients History and Physical, chart, labs and discussed the procedure including the risks, benefits and alternatives for the proposed anesthesia with the  patient or authorized representative who has indicated his/her understanding and acceptance.     Plan Discussed with: CRNA  Anesthesia Plan Comments:         Anesthesia Quick Evaluation

## 2017-07-05 NOTE — Transfer of Care (Signed)
Immediate Anesthesia Transfer of Care Note  Patient: [name] [name]  Procedure(s) Performed: ENTEROSCOPY (N/A )  Patient Location: PACU  Anesthesia Type:General  Level of Consciousness: awake and patient cooperative  Airway & Oxygen Therapy: Patient Spontanous Breathing and Patient connected to nasal cannula oxygen  Post-op Assessment: Report given to RN and Post -op Vital signs reviewed and stable  Post vital signs: Reviewed and stable  Last Vitals:  Vitals Value Taken Time  BP 134/77 07/05/2017  9:23 AM  Temp 36.5 C 07/05/2017  9:23 AM  Pulse 72 07/05/2017  9:27 AM  Resp 14 07/05/2017  9:27 AM  SpO2 100 % 07/05/2017  9:27 AM  Vitals shown include unvalidated device data.  Last Pain:  Vitals:   07/05/17 0923  TempSrc:   PainSc: 0-No pain         Complications: No apparent anesthesia complications

## 2017-07-05 NOTE — Op Note (Addendum)
[facility] Patient Name: [name] [name] Procedure Date: 07/05/2017 8:57 AM MRN: [id number] Account #: [id number] Date of Birth: [birth year] Admit Type: Outpatient Age: 84 Room: [facility] ENDO ROOM 3 Gender: Female Note Status: Finalized Procedure:            Upper GI endoscopy Indications:          Melena, lots of blood seen in stomach on capsule stuidy                        of 06/21/17 Providers:            [name] MD, MD Referring MD:         Ocie Cornfield. Ouida Sills MD, MD (Referring MD) Medicines:            Monitored Anesthesia Care Complications:        No immediate complications. Procedure:            Pre-Anesthesia Assessment:                       - Prior to the procedure, a History and Physical was                        performed, and patient medications, allergies and                        sensitivities were reviewed. The patient's tolerance of                        previous anesthesia was reviewed.                       - The risks and benefits of the procedure and the                        sedation options and risks were discussed with the                        patient. All questions were answered and informed                        consent was obtained.                       - ASA Grade Assessment: III - A patient with severe                        systemic disease.                       After obtaining informed consent, the endoscope was                        passed under direct vision. Throughout the procedure,                        the patient's blood pressure, pulse, and oxygen                        saturations were monitored continuously. The Endoscope  was introduced through the mouth and advanced to the                        third part of duodenum. The small bowel enteroscopy was                        accomplished with ease. The patient tolerated the                        procedure  well. Findings:      The esophagus was normal.      The examined duodenum was normal.      A single large no bleeding angioectasia was found on the greater       curvature of the stomach. Coagulation for hemostasis using argon plasma       at 0.5 liters/minute and 20 watts was successful. To prevent bleeding       post-intervention, two hemostatic clips were successfully placed. There       was no bleeding during, or at the end, of the procedure.      The cardia and gastric fundus were normal on retroflexion. Impression:           - Normal esophagus.                       - Normal examined duodenum.                       - A single non-bleeding angioectasia in the stomach.                        Treated with argon plasma coagulation (APC). Clips were                        placed.                       - No specimens collected. Recommendation:       - Discharge patient to home (with escort).                       - Resume previous diet.                       - Use Prilosec (omeprazole) 40 mg PO daily for 6 weeks.                       - Rfer back for small bowel enteroscopy to ablate                        bledinfg site seen in jejunum on capsule study Procedure Code(s):    --- Professional ---                       [phone], Esophagogastroduodenoscopy, flexible, transoral;                        with control of bleeding, any method Diagnosis Code(s):    --- Professional ---                       K31.819, Angiodysplasia of stomach and duodenum without  bleeding                       K92.1, Melena (includes Hematochezia) CPT copyright 2017 American Medical Association. All rights reserved. The codes documented in this report are preliminary and upon coder review may  be revised to meet current compliance requirements. [name], MD [name] MD, MD 07/05/2017 9:21:38 AM This report has been signed electronically. Number of Addenda: 0 Note Initiated On: 07/05/2017  8:57 AM      [facility]

## 2017-07-05 NOTE — Telephone Encounter (Signed)
Marsha from endo left vm pt just got discharged  from procedure  Dr. Vicente Males wants pt to get rx  prilosect 40 mg  Or rx ametrcosole for 6 weeks she wants [place] to know pt changed pharmacy to [facility] on [address] by [address].

## 2017-07-05 NOTE — H&P (Signed)
[name], MD [address] [place], [place], [place], [address] [address], [place], [place], [place], [address] Phone: [phone]  Fax: [phone]  Primary Care Physician:  Kirk Ruths, MD   Pre-Procedure History & Physical: HPI:  [name] [name] is a 84 y.o. female is here for an endoscopy    Past Medical History:  Diagnosis Date  . Anemia   . COPD (chronic obstructive pulmonary disease) ([place])   . Diverticulitis   . GERD (gastroesophageal reflux disease)   . Glaucoma   . HLD (hyperlipidemia)   . Hypertension   . Iron deficiency   . Rectal bleeding     Past Surgical History:  Procedure Laterality Date  . ABDOMINAL HYSTERECTOMY    . APPENDECTOMY    . COLONOSCOPY WITH PROPOFOL N/A 04/14/2016   Procedure: COLONOSCOPY WITH PROPOFOL;  Surgeon: Lucilla Lame, MD;  Location: [facility] ENDOSCOPY;  Service: Endoscopy;  Laterality: N/A;  . ENTEROSCOPY N/A 05/10/2016   Procedure: Push ENTEROSCOPY with pediatric colonoscope;  Surgeon: [name], MD;  Location: [facility] ENDOSCOPY;  Service: Endoscopy;  Laterality: N/A;  . ENTEROSCOPY N/A 05/30/2016   Procedure: push ENTEROSCOPY;  Surgeon: [name], MD;  Location: [facility] ENDOSCOPY;  Service: Endoscopy;  Laterality: N/A;  . ESOPHAGOGASTRODUODENOSCOPY (EGD) WITH PROPOFOL N/A 08/12/2015   Procedure: ESOPHAGOGASTRODUODENOSCOPY (EGD) WITH PROPOFOL;  Surgeon: Lollie Sails, MD;  Location: [facility] ENDOSCOPY;  Service: Endoscopy;  Laterality: N/A;  . ESOPHAGOGASTRODUODENOSCOPY (EGD) WITH PROPOFOL N/A 04/03/2016   Procedure: ESOPHAGOGASTRODUODENOSCOPY (EGD) WITH PROPOFOL;  Surgeon: Lucilla Lame, MD;  Location: [facility] ENDOSCOPY;  Service: Endoscopy;  Laterality: N/A;  . GIVENS CAPSULE STUDY N/A 04/28/2016   Procedure: GIVENS CAPSULE STUDY;  Surgeon: [name], MD;  Location: [facility] ENDOSCOPY;  Service: Endoscopy;  Laterality: N/A;  . GIVENS CAPSULE STUDY N/A 06/21/2017   Procedure: GIVENS CAPSULE STUDY;  Surgeon: [name], MD;   Location: [facility] ENDOSCOPY;  Service: Gastroenterology;  Laterality: N/A;  . JOINT REPLACEMENT      Prior to Admission medications   Medication Sig Start Date End Date Taking? Authorizing Provider  albuterol (PROVENTIL HFA;VENTOLIN HFA) 108 (90 Base) MCG/ACT inhaler Inhale 2 puffs into the lungs every 4 (four) hours as needed for wheezing or shortness of breath.   Yes [provider]  chlorthalidone (HYGROTON) 25 MG tablet Take 12.5 mg by mouth daily.  01/18/16  Yes [provider]  Coenzyme Q10 10 MG capsule Take by mouth.   Yes [provider]  COENZYME Q10 PO Take 1 tablet by mouth daily.   Yes [provider]  colchicine 0.6 MG tablet Take 0.6 mg by mouth daily.  08/28/13  Yes [provider]  Cyanocobalamin (B-12) 1000 MCG CAPS Take 1,000 Units by mouth 2 (two) times daily.    Yes [provider]  cyanocobalamin ([employer] VITAMIN B12) 1000 MCG tablet Take by mouth.   Yes [provider]  cycloSPORINE (RESTASIS) 0.05 % ophthalmic emulsion Place 1 drop into both eyes 2 (two) times daily.   Yes [provider]  fluticasone (FLONASE) 50 MCG/ACT nasal spray fluticasone propionate 50 mcg/actuation nasal spray,suspension   Yes [provider]  gabapentin (NEURONTIN) 300 MG capsule Take 300 mg by mouth 2 (two) times daily.   Yes [provider]  guaiFENesin (MUCINEX) 600 MG 12 hr tablet Take 600 mg by mouth 2 (two) times daily as needed.   Yes [provider]  hydrocortisone (PROCTOSOL HC) 2.5 % rectal cream PLACE RECTALLY TWICE  A DAY 10/13/15  Yes [provider]  latanoprost (XALATAN) 0.005 % ophthalmic solution Apply to eye. 09/28/16 09/28/17 Yes [provider]  losartan (COZAAR) 50 MG tablet Take 50 mg by mouth daily.   Yes [provider]  menthol-zinc oxide (GOLD BOND) powder Apply topically once daily as needed (PRN under breasts for yeast).   Yes [provider]    mometasone (ELOCON) 0.1 % lotion Apply topically.   Yes [provider]  Multiple Vitamins-Minerals (OCUVITE PRESERVISION PO) Take 1 tablet by mouth 2 (two) times daily.   Yes [provider]  Multiple Vitamins-Minerals (OCUVITE PRESERVISION PO) Take by mouth.   Yes [provider]  PARoxetine (PAXIL) 40 MG tablet Take 40 mg by mouth every morning.   Yes [provider]  potassium chloride (K-DUR) 10 MEQ tablet Take by mouth. 04/30/17  Yes [provider]  potassium chloride (K-DUR,KLOR-CON) 10 MEQ tablet Take 10 mEq by mouth 2 (two) times daily.    Yes [provider]  PROPYLENE GLYCOL-GLYCERIN OP Apply to eye.   Yes [provider]  RABEprazole (ACIPHEX) 20 MG tablet Take 20 mg by mouth 2 (two) times daily.   Yes [provider]  Spacer/Aero-Holding Chambers (E-Z SPACER) inhaler Use as instructed. 11/05/15  Yes [provider]  sucralfate (CARAFATE) 1 g tablet Take 1 tablet (1 g total) by mouth 4 (four) times daily -  with meals and at bedtime. 08/13/15  Yes Dustin Flock, MD  Tetrahydroz-Dextran-PEG-Povid (EYE DROPS ADVANCED RELIEF OP) Apply to eye.   Yes [provider]  timolol (TIMOPTIC-XR) 0.5 % ophthalmic gel-forming Place 1 drop into both eyes daily.   Yes [provider]  tiZANidine (ZANAFLEX) 4 MG tablet Take by mouth. 01/23/17 01/23/18 Yes [provider]  valACYclovir (VALTREX) 1000 MG tablet once daily as needed. Reported on 03/22/2015 01/18/13  Yes [provider]  acetaminophen (TYLENOL) 650 MG CR tablet Take 650 mg by mouth every 8 (eight) hours as needed for pain.    [provider]  fluticasone (FLOVENT HFA) 110 MCG/ACT inhaler Inhale 1 puff into the lungs 2 (two) times daily.  01/12/16 12/26/16  [provider]  loratadine (CLARITIN) 10 MG tablet Take 10 mg by mouth daily as needed for allergies.    [provider]  Polyethyl Glycol-Propyl  Glycol (SYSTANE) 0.4-0.3 % SOLN Apply 1 drop to eye as needed.    [provider]    Allergies as of 07/04/2017 - Review Complete 06/26/2017  Allergen Reaction Noted  . Nsaids Hives 08/10/2015  . Codeine Nausea And Vomiting 08/10/2015  . Dextrans Hives 08/10/2015  . Fentanyl Itching 08/10/2015  . Lipitor [atorvastatin] Other (See Comments) 08/10/2015  . Lovastatin    . Mobic [meloxicam] Other (See Comments) 08/10/2015  . Niacin and related Itching 08/10/2015  . Other    . Polysaccharide k Hives 08/10/2015  . Pravachol [pravastatin sodium] Other (See Comments) 08/10/2015  . Statins Other (See Comments) 03/31/2016  . Tolmetin Hives 08/10/2015  . Voltaren [diclofenac sodium] Other (See Comments) 08/10/2015  . Zetia [ezetimibe] Other (See Comments) 08/10/2015  . Niacin Itching 08/10/2015    Family History  Problem Relation Age of Onset  . CAD Mother   . CAD Father   . CAD Brother     Social History   Socioeconomic History  . Marital status: Widowed    Spouse name: Not on file  . Number of children: Not on file  . Years of education: Not  on file  . Highest education level: Not on file  Occupational History  . Not on file  Social Needs  . Financial resource strain: Not on file  . Food insecurity:    Worry: Not on file    Inability: Not on file  . Transportation needs:    Medical: Not on file    Non-medical: Not on file  Tobacco Use  . Smoking status: Former Smoker    Types: Cigarettes  . Smokeless tobacco: Never Used  Substance and Sexual Activity  . Alcohol use: No  . Drug use: No  . Sexual activity: Not on file  Lifestyle  . Physical activity:    Days per week: Not on file    Minutes per session: Not on file  . Stress: Not on file  Relationships  . Social connections:    Talks on phone: Not on file    Gets together: Not on file    Attends religious service: Not on file    Active member of club or organization: Not on file    Attends meetings of  clubs or organizations: Not on file    Relationship status: Not on file  . Intimate partner violence:    Fear of current or ex partner: Not on file    Emotionally abused: Not on file    Physically abused: Not on file    Forced sexual activity: Not on file  Other Topics Concern  . Not on file  Social History Narrative  . Not on file    Review of Systems: See HPI, otherwise negative ROS  Physical Exam: BP (!) 157/73   Pulse 75   Temp (!) 96.8 F (36 C) (Tympanic)   Resp 18   Ht 5\' 8"  (1.727 m)   Wt 185 lb (83.9 kg)   SpO2 98%   BMI 28.13 kg/m  General:   Alert,  pleasant and cooperative in NAD Head:  Normocephalic and atraumatic. Neck:  Supple; no masses or thyromegaly. Lungs:  Clear throughout to auscultation, normal respiratory effort.    Heart:  +S1, +S2, Regular rate and rhythm, No edema. Abdomen:  Soft, nontender and nondistended. Normal bowel sounds, without guarding, and without rebound.   Neurologic:  Alert and  oriented x4;  grossly normal neurologically.  Impression/Plan: [name] [name] is here for an endoscopy  to be performed for  evaluation of gi bleed    Risks, benefits, limitations, and alternatives regarding endoscopy have been reviewed with the patient.  Questions have been answered.  All parties agreeable.   [name], MD  07/05/2017, 8:59 AM

## 2017-07-06 ENCOUNTER — Encounter: Payer: Self-pay | Admitting: Gastroenterology

## 2017-07-06 ENCOUNTER — Telehealth: Payer: Self-pay

## 2017-07-06 NOTE — Telephone Encounter (Signed)
1. Spoke to [employer]. 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   2. Faxed Upper Endoscopy and Capsule Study results to Dr. Egbert Garibaldi.

## 2017-07-06 NOTE — Telephone Encounter (Signed)
-----   Message from [name], MD sent at 07/05/2017  9:28 AM EDT ----- Regarding: capsule Ginger  I read her capsule - she was bleeding in the stomach- I did EGD today and burnt an AVM  She was also bleeding in her small bowel on the capsule and has had enteroscopy at [facility] for the same AVM . Can we inform them that she is bleeding again on the repeat capsule and likely will need repeat enteroscopy  Kiran

## 2017-07-10 ENCOUNTER — Inpatient Hospital Stay: Payer: Medicare Other

## 2017-07-17 ENCOUNTER — Other Ambulatory Visit: Payer: Self-pay

## 2017-07-17 ENCOUNTER — Inpatient Hospital Stay: Payer: Medicare Other | Attending: Oncology

## 2017-07-17 DIAGNOSIS — D500 Iron deficiency anemia secondary to blood loss (chronic): Secondary | ICD-10-CM | POA: Insufficient documentation

## 2017-07-17 LAB — CBC WITH DIFFERENTIAL/PLATELET
Basophils Absolute: 0.2 10*3/uL — ABNORMAL HIGH (ref 0–0.1)
Basophils Relative: 3 %
EOS ABS: 0.2 10*3/uL (ref 0–0.7)
Eosinophils Relative: 3 %
HEMATOCRIT: 37.7 % (ref 35.0–47.0)
HEMOGLOBIN: 12.7 g/dL (ref 12.0–16.0)
LYMPHS ABS: 1.1 10*3/uL (ref 1.0–3.6)
Lymphocytes Relative: 17 %
MCH: 28.7 pg (ref 26.0–34.0)
MCHC: 33.7 g/dL (ref 32.0–36.0)
MCV: 85.1 fL (ref 80.0–100.0)
MONO ABS: 0.9 10*3/uL (ref 0.2–0.9)
Monocytes Relative: 13 %
NEUTROS PCT: 64 %
Neutro Abs: 4.1 10*3/uL (ref 1.4–6.5)
Platelets: 301 10*3/uL (ref 150–440)
RBC: 4.43 MIL/uL (ref 3.80–5.20)
RDW: 20 % — AB (ref 11.5–14.5)
WBC: 6.5 10*3/uL (ref 3.6–11.0)

## 2017-07-31 ENCOUNTER — Encounter: Payer: Self-pay | Admitting: Gastroenterology

## 2017-09-20 DIAGNOSIS — Z9989 Dependence on other enabling machines and devices: Secondary | ICD-10-CM | POA: Insufficient documentation

## 2017-09-21 ENCOUNTER — Encounter (HOSPITAL_COMMUNITY): Payer: Self-pay | Admitting: Family Medicine

## 2017-09-25 ENCOUNTER — Inpatient Hospital Stay: Payer: Medicare Other | Attending: Oncology | Admitting: Oncology

## 2017-09-25 ENCOUNTER — Inpatient Hospital Stay: Payer: Medicare Other

## 2017-09-25 ENCOUNTER — Encounter: Payer: Self-pay | Admitting: Oncology

## 2017-09-25 VITALS — BP 115/70 | HR 71 | Temp 97.7°F | Resp 18 | Ht 68.0 in | Wt 188.0 lb

## 2017-09-25 DIAGNOSIS — Z87891 Personal history of nicotine dependence: Secondary | ICD-10-CM | POA: Insufficient documentation

## 2017-09-25 DIAGNOSIS — D508 Other iron deficiency anemias: Secondary | ICD-10-CM | POA: Insufficient documentation

## 2017-09-25 DIAGNOSIS — K5521 Angiodysplasia of colon with hemorrhage: Secondary | ICD-10-CM | POA: Diagnosis not present

## 2017-09-25 DIAGNOSIS — D500 Iron deficiency anemia secondary to blood loss (chronic): Secondary | ICD-10-CM

## 2017-09-25 DIAGNOSIS — E538 Deficiency of other specified B group vitamins: Secondary | ICD-10-CM

## 2017-09-25 LAB — CBC WITH DIFFERENTIAL/PLATELET
BASOS ABS: 0.2 10*3/uL — AB (ref 0–0.1)
BASOS PCT: 3 %
EOS ABS: 0.3 10*3/uL (ref 0–0.7)
EOS PCT: 4 %
HCT: 33.9 % — ABNORMAL LOW (ref 35.0–47.0)
Hemoglobin: 11.3 g/dL — ABNORMAL LOW (ref 12.0–16.0)
LYMPHS ABS: 1.2 10*3/uL (ref 1.0–3.6)
Lymphocytes Relative: 17 %
MCH: 28.5 pg (ref 26.0–34.0)
MCHC: 33.4 g/dL (ref 32.0–36.0)
MCV: 85.2 fL (ref 80.0–100.0)
Monocytes Absolute: 1 10*3/uL — ABNORMAL HIGH (ref 0.2–0.9)
Monocytes Relative: 14 %
Neutro Abs: 4.5 10*3/uL (ref 1.4–6.5)
Neutrophils Relative %: 62 %
PLATELETS: 294 10*3/uL (ref 150–440)
RBC: 3.97 MIL/uL (ref 3.80–5.20)
RDW: 14.7 % — ABNORMAL HIGH (ref 11.5–14.5)
WBC: 7.2 10*3/uL (ref 3.6–11.0)

## 2017-09-25 LAB — IRON AND TIBC
Iron: 31 ug/dL (ref 28–170)
SATURATION RATIOS: 9 % — AB (ref 10.4–31.8)
TIBC: 347 ug/dL (ref 250–450)
UIBC: 316 ug/dL

## 2017-09-25 LAB — FERRITIN: FERRITIN: 14 ng/mL (ref 11–307)

## 2017-09-25 NOTE — Progress Notes (Signed)
Pt is upset about her wt gain. It has been so hot all she is doing is sitting an eating

## 2017-09-26 ENCOUNTER — Telehealth: Payer: Self-pay

## 2017-09-26 NOTE — Telephone Encounter (Signed)
Spoke with [name] [name] to inform her, per Dr Janese Banks the patient ferritin levels (9) and would need Feraheme x 2 doses. The patient was understanding and agreeable for treatment. The patient has been schedule for feraheme treatment on 9/23 and 9/30.

## 2017-09-27 NOTE — Progress Notes (Signed)
Hematology/Oncology Consult note [facility]  [phone] [phone]  Patient Care Team: Kirk Ruths, MD as PCP - General (Internal Medicine)   Name of the patient: [name] [name]  [id number]  25-Mar-1925   Date of visit: 09/27/17  Diagnosis- iron deficiency anemia  Chief complaint/ Reason for visit- routine f/u of iron deficiency anemia  Heme/Onc history: Patient is a 84 year old female who was referred to the clinic in October 2017 for chronic iron deficiency anemia with history of diverticulosis. She has been unable to tolerate oral iron supplements due to abdominal cramping and diarrhea. She has required several blood transfusions in the past and received regular IV iron infusions. She has tolerated these without complications. Iron and blood transfusions have been given on a palliative basis with a goal hemoglobin of >10.   March 2018 she underwent an EGD and colonoscopy which did not reveal source of bleeding.  May 2/18 - enteroscopy revealed 3 avms which were ablated. She suffered melena and had a hemoglobin of 6.7 and was hospitalized. At that time, enteroscopy was repeated which was normal. She received a blood transfusion and her hemoglobin was 9.6.   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   08/07/2016 Capsule study showed active bleeding from single spot in proximal jejunum. She underwent push enteroscopy with argon laser therapy but no bleeding was found.    Interval history- reports 1 episode of dark melanotic stool a week ago. None since then. No BRBPR. She feels well overall. She is concerned about her weight gain  ECOG PS- 1-2 Pain scale- 0 Opioid associated constipation- no  Review of systems- Review of Systems  Constitutional: Negative for chills, fever, malaise/fatigue and weight loss.  HENT: Negative for congestion, ear discharge and  nosebleeds.   Eyes: Negative for blurred vision.  Respiratory: Negative for cough, hemoptysis, sputum production, shortness of breath and wheezing.   Cardiovascular: Negative for chest pain, palpitations, orthopnea and claudication.  Gastrointestinal: Negative for abdominal pain, blood in stool, constipation, diarrhea, heartburn, melena, nausea and vomiting.  Genitourinary: Negative for dysuria, flank pain, frequency, hematuria and urgency.  Musculoskeletal: Negative for back pain, joint pain and myalgias.  Skin: Negative for rash.  Neurological: Negative for dizziness, tingling, focal weakness, seizures, weakness and headaches.  Endo/Heme/Allergies: Does not bruise/bleed easily.  Psychiatric/Behavioral: Negative for depression and suicidal ideas. The patient does not have insomnia.      Allergies  Allergen Reactions  . Nsaids Hives  . Codeine Nausea And Vomiting  . Dextrans Hives  . Fentanyl Itching  . Lipitor [Atorvastatin] Other (See Comments)    Reaction: Muscle pain  . Lovastatin   . Mobic [Meloxicam] Other (See Comments)    Reaction: Mouth and tongue ulcers  . Niacin And Related Itching  . Other   . Polysaccharide K Hives  . Pravachol [Pravastatin Sodium] Other (See Comments)    Reaction: Muscle pain  . Statins Other (See Comments)  . Tolmetin Hives  . Voltaren [Diclofenac Sodium] Other (See Comments)    Reaction: Mouth and tongue ulcers  . Zetia [Ezetimibe] Other (See Comments)    Reaction: Leg pain  . Niacin Itching     Past Medical History:  Diagnosis Date  . Anemia   . COPD (chronic obstructive pulmonary disease) ([place])   . Diverticulitis   . GERD (gastroesophageal reflux disease)   . Glaucoma   . HLD (hyperlipidemia)   . Hypertension   .  Iron deficiency   . Rectal bleeding      Past Surgical History:  Procedure Laterality Date  . ABDOMINAL HYSTERECTOMY    . APPENDECTOMY    . COLONOSCOPY WITH PROPOFOL N/A 04/14/2016   Procedure: COLONOSCOPY WITH  PROPOFOL;  Surgeon: Lucilla Lame, MD;  Location: [facility] ENDOSCOPY;  Service: Endoscopy;  Laterality: N/A;  . ENTEROSCOPY N/A 05/10/2016   Procedure: Push ENTEROSCOPY with pediatric colonoscope;  Surgeon: [name], MD;  Location: [facility] ENDOSCOPY;  Service: Endoscopy;  Laterality: N/A;  . ENTEROSCOPY N/A 05/30/2016   Procedure: push ENTEROSCOPY;  Surgeon: [name], MD;  Location: [facility] ENDOSCOPY;  Service: Endoscopy;  Laterality: N/A;  . ENTEROSCOPY N/A 07/05/2017   Procedure: ENTEROSCOPY;  Surgeon: [name], MD;  Location: [facility] ENDOSCOPY;  Service: Gastroenterology;  Laterality: N/A;  . ESOPHAGOGASTRODUODENOSCOPY (EGD) WITH PROPOFOL N/A 08/12/2015   Procedure: ESOPHAGOGASTRODUODENOSCOPY (EGD) WITH PROPOFOL;  Surgeon: Lollie Sails, MD;  Location: [facility] ENDOSCOPY;  Service: Endoscopy;  Laterality: N/A;  . ESOPHAGOGASTRODUODENOSCOPY (EGD) WITH PROPOFOL N/A 04/03/2016   Procedure: ESOPHAGOGASTRODUODENOSCOPY (EGD) WITH PROPOFOL;  Surgeon: Lucilla Lame, MD;  Location: [facility] ENDOSCOPY;  Service: Endoscopy;  Laterality: N/A;  . GIVENS CAPSULE STUDY N/A 04/28/2016   Procedure: GIVENS CAPSULE STUDY;  Surgeon: [name], MD;  Location: [facility] ENDOSCOPY;  Service: Endoscopy;  Laterality: N/A;  . GIVENS CAPSULE STUDY N/A 06/21/2017   Procedure: GIVENS CAPSULE STUDY;  Surgeon: [name], MD;  Location: [facility] ENDOSCOPY;  Service: Gastroenterology;  Laterality: N/A;  . JOINT REPLACEMENT      Social History   Socioeconomic History  . Marital status: Widowed    Spouse name: Not on file  . Number of children: Not on file  . Years of education: Not on file  . Highest education level: Not on file  Occupational History  . Not on file  Social Needs  . Financial resource strain: Not on file  . Food insecurity:    Worry: Not on file    Inability: Not on file  . Transportation needs:    Medical: Not on file    Non-medical: Not on file  Tobacco Use  . Smoking status: Former Smoker    Types: Cigarettes  .  Smokeless tobacco: Never Used  Substance and Sexual Activity  . Alcohol use: No  . Drug use: No  . Sexual activity: Not on file  Lifestyle  . Physical activity:    Days per week: Not on file    Minutes per session: Not on file  . Stress: Not on file  Relationships  . Social connections:    Talks on phone: Not on file    Gets together: Not on file    Attends religious service: Not on file    Active member of club or organization: Not on file    Attends meetings of clubs or organizations: Not on file    Relationship status: Not on file  . Intimate partner violence:    Fear of current or ex partner: Not on file    Emotionally abused: Not on file    Physically abused: Not on file    Forced sexual activity: Not on file  Other Topics Concern  . Not on file  Social History Narrative  . Not on file    Family History  Problem Relation Age of Onset  . CAD Mother   . CAD Father   . CAD Brother      Current Outpatient Medications:  .  acetaminophen (TYLENOL) 650 MG CR tablet, Take  650 mg by mouth every 8 (eight) hours as needed for pain., Disp: , Rfl:  .  albuterol (PROVENTIL HFA;VENTOLIN HFA) 108 (90 Base) MCG/ACT inhaler, Inhale 2 puffs into the lungs every 4 (four) hours as needed for wheezing or shortness of breath., Disp: , Rfl:  .  chlorthalidone (HYGROTON) 25 MG tablet, Take 12.5 mg by mouth daily. , Disp: , Rfl:  .  Coenzyme Q10 10 MG capsule, Take 10 mg by mouth daily. , Disp: , Rfl:  .  COENZYME Q10 PO, Take 1 tablet by mouth daily., Disp: , Rfl:  .  Cyanocobalamin (B-12) 1000 MCG CAPS, Take 1,000 Units by mouth 2 (two) times daily. , Disp: , Rfl:  .  cycloSPORINE (RESTASIS) 0.05 % ophthalmic emulsion, Place 1 drop into both eyes 2 (two) times daily., Disp: , Rfl:  .  fluticasone (FLONASE) 50 MCG/ACT nasal spray, Place 1 spray into both nostrils daily as needed. , Disp: , Rfl:  .  losartan (COZAAR) 50 MG tablet, Take 50 mg by mouth daily., Disp: , Rfl:  .  menthol-zinc  oxide (GOLD BOND) powder, Apply topically once daily as needed (PRN under breasts for yeast)., Disp: , Rfl:  .  mometasone (ELOCON) 0.1 % lotion, Apply topically., Disp: , Rfl:  .  Multiple Vitamins-Minerals (OCUVITE PRESERVISION PO), Take 1 tablet by mouth 2 (two) times daily., Disp: , Rfl:  .  Multiple Vitamins-Minerals (OCUVITE PRESERVISION PO), Take by mouth., Disp: , Rfl:  .  omeprazole (PRILOSEC) 40 MG capsule, Take 1 capsule (40 mg total) by mouth daily., Disp: 90 capsule, Rfl: 3 .  PARoxetine (PAXIL) 40 MG tablet, Take 40 mg by mouth every morning., Disp: , Rfl:  .  Polyethyl Glycol-Propyl Glycol (SYSTANE) 0.4-0.3 % SOLN, Apply 1 drop to eye as needed., Disp: , Rfl:  .  potassium chloride (K-DUR) 10 MEQ tablet, Take 10 mEq by mouth daily. , Disp: , Rfl:  .  potassium chloride (K-DUR,KLOR-CON) 10 MEQ tablet, Take 10 mEq by mouth 2 (two) times daily. , Disp: , Rfl:  .  PROPYLENE GLYCOL-GLYCERIN OP, Apply to eye., Disp: , Rfl:  .  RABEprazole (ACIPHEX) 20 MG tablet, Take 20 mg by mouth 2 (two) times daily., Disp: , Rfl:  .  Spacer/Aero-Holding Chambers (E-Z SPACER) inhaler, Use as instructed., Disp: , Rfl:  .  sucralfate (CARAFATE) 1 g tablet, Take 1 tablet (1 g total) by mouth 4 (four) times daily -  with meals and at bedtime., Disp: 120 tablet, Rfl: 1 .  Tetrahydroz-Dextran-PEG-Povid (EYE DROPS ADVANCED RELIEF OP), Apply to eye., Disp: , Rfl:  .  timolol (TIMOPTIC-XR) 0.5 % ophthalmic gel-forming, Place 1 drop into both eyes daily., Disp: , Rfl:  .  tiZANidine (ZANAFLEX) 4 MG tablet, Take 4 mg by mouth every 6 (six) hours as needed. , Disp: , Rfl:  .  valACYclovir (VALTREX) 1000 MG tablet, once daily as needed. Reported on 03/22/2015, Disp: , Rfl:  .  colchicine 0.6 MG tablet, Take 0.6 mg by mouth daily. , Disp: , Rfl:  .  gabapentin (NEURONTIN) 300 MG capsule, Take 300 mg by mouth 2 (two) times daily., Disp: , Rfl:  .  guaiFENesin (MUCINEX) 600 MG 12 hr tablet, Take 600 mg by mouth 2 (two)  times daily as needed., Disp: , Rfl:  .  hydrocortisone (PROCTOSOL HC) 2.5 % rectal cream, PLACE RECTALLY TWICE A DAY, Disp: , Rfl:  .  latanoprost (XALATAN) 0.005 % ophthalmic solution, Apply to eye., Disp: , Rfl:  .  loratadine (CLARITIN) 10 MG tablet, Take 10 mg by mouth daily as needed for allergies., Disp: , Rfl:   Physical exam:  Vitals:   09/25/17 1443  BP: 115/70  Pulse: 71  Resp: 18  Temp: 97.7 F (36.5 C)  TempSrc: Tympanic  Weight: 188 lb (85.3 kg)  Height: 5\' 8"  (1.727 m)   Physical Exam  Constitutional: She is oriented to person, place, and time. She appears well-developed and well-nourished.  Elderly female who ambulates with a cane. Appears in no acute distress  HENT:  Head: Normocephalic and atraumatic.  Eyes: Pupils are equal, round, and reactive to light. EOM are normal.  Neck: Normal range of motion.  Cardiovascular: Normal rate, regular rhythm and normal heart sounds.  Pulmonary/Chest: Effort normal and breath sounds normal.  Abdominal: Soft. Bowel sounds are normal.  Neurological: She is alert and oriented to person, place, and time.  Skin: Skin is warm and dry.     CMP Latest Ref Rng & Units 06/26/2017  Glucose 65 - 99 mg/dL 93  BUN 6 - 20 mg/dL 20  Creatinine 0.44 - 1.00 mg/dL 0.68  Sodium 135 - 145 mmol/L 134(L)  Potassium 3.5 - 5.1 mmol/L 3.8  Chloride 101 - 111 mmol/L 97(L)  CO2 22 - 32 mmol/L 26  Calcium 8.9 - 10.3 mg/dL 9.3  Total Protein 6.5 - 8.1 g/dL -  Total Bilirubin 0.3 - 1.2 mg/dL -  Alkaline Phos 38 - 126 U/L -  AST 15 - 41 U/L -  ALT 14 - 54 U/L -   CBC Latest Ref Rng & Units 09/25/2017  WBC 3.6 - 11.0 K/uL 7.2  Hemoglobin 12.0 - 16.0 g/dL 11.3(L)  Hematocrit 35.0 - 47.0 % 33.9(L)  Platelets 150 - 440 K/uL 294      Assessment and plan- Patient is a 84 y.o. 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 She is here for routine f/u of iron deficiency anemia  Her hb is at baseline today. Iron studies were not back  at the time of my visit. However it came back a day later and showed evidence of continued iron deficiency. We have called her and set her up for 2 doses of feraheme. She has tolerated it well in the past. No issues with infusion reaction from feraheme. Repeat cbc ferritin and irons tudies in 3 and 6 months and I will see her back in 6 months. I will also check her b12 level in 6 months.   If she were to have any GI bleeding, she will get in touch with Dr. Vicente Males   Visit Diagnosis 1. Iron deficiency anemia due to chronic blood loss   2. B12 deficiency      Dr. Randa Evens, MD, MPH [facility] [phone] 09/27/2017 8:57 AM

## 2017-10-01 ENCOUNTER — Inpatient Hospital Stay: Payer: Medicare Other

## 2017-10-01 VITALS — BP 136/79 | HR 87 | Temp 98.9°F | Resp 20

## 2017-10-01 DIAGNOSIS — D500 Iron deficiency anemia secondary to blood loss (chronic): Secondary | ICD-10-CM

## 2017-10-01 DIAGNOSIS — D508 Other iron deficiency anemias: Secondary | ICD-10-CM | POA: Diagnosis not present

## 2017-10-01 MED ORDER — FERUMOXYTOL INJECTION 510 MG/17 ML
510.0000 mg | Freq: Once | INTRAVENOUS | Status: AC
  Administered 2017-10-01: 510 mg via INTRAVENOUS
  Filled 2017-10-01: qty 17

## 2017-10-01 MED ORDER — SODIUM CHLORIDE 0.9 % IV SOLN
Freq: Once | INTRAVENOUS | Status: AC
  Administered 2017-10-01: 14:00:00 via INTRAVENOUS
  Filled 2017-10-01: qty 250

## 2017-10-05 ENCOUNTER — Telehealth: Payer: Self-pay | Admitting: *Deleted

## 2017-10-05 NOTE — Telephone Encounter (Addendum)
Patient called to report that Prilosec is not doing her any good and she has gone back to Aciphex twice a day, She needs a refill of this and cannot get n touch with Dr Georgeann Oppenheim office so she has called her PCP to refill it for her

## 2017-10-08 ENCOUNTER — Other Ambulatory Visit: Payer: Self-pay | Admitting: Oncology

## 2017-10-08 ENCOUNTER — Inpatient Hospital Stay: Payer: Medicare Other

## 2017-10-08 VITALS — BP 129/71 | HR 73 | Temp 97.1°F | Resp 20

## 2017-10-08 DIAGNOSIS — D500 Iron deficiency anemia secondary to blood loss (chronic): Secondary | ICD-10-CM

## 2017-10-08 DIAGNOSIS — D508 Other iron deficiency anemias: Secondary | ICD-10-CM | POA: Diagnosis not present

## 2017-10-08 MED ORDER — SODIUM CHLORIDE 0.9 % IV SOLN
510.0000 mg | Freq: Once | INTRAVENOUS | Status: AC
  Administered 2017-10-08: 510 mg via INTRAVENOUS
  Filled 2017-10-08: qty 17

## 2017-10-08 MED ORDER — SODIUM CHLORIDE 0.9 % IV SOLN
INTRAVENOUS | Status: AC
  Administered 2017-10-08 – 2018-06-21 (×2): via INTRAVENOUS
  Filled 2017-10-08: qty 250

## 2017-12-25 ENCOUNTER — Inpatient Hospital Stay: Payer: Medicare Other | Attending: Oncology

## 2017-12-25 DIAGNOSIS — D500 Iron deficiency anemia secondary to blood loss (chronic): Secondary | ICD-10-CM

## 2017-12-25 DIAGNOSIS — D509 Iron deficiency anemia, unspecified: Secondary | ICD-10-CM | POA: Diagnosis present

## 2017-12-25 LAB — CBC WITH DIFFERENTIAL/PLATELET
ABS IMMATURE GRANULOCYTES: 0.02 10*3/uL (ref 0.00–0.07)
BASOS ABS: 0.2 10*3/uL — AB (ref 0.0–0.1)
Basophils Relative: 2 %
Eosinophils Absolute: 0.3 10*3/uL (ref 0.0–0.5)
Eosinophils Relative: 4 %
HEMATOCRIT: 36.3 % (ref 36.0–46.0)
HEMOGLOBIN: 11.7 g/dL — AB (ref 12.0–15.0)
IMMATURE GRANULOCYTES: 0 %
LYMPHS ABS: 1.2 10*3/uL (ref 0.7–4.0)
LYMPHS PCT: 16 %
MCH: 28.3 pg (ref 26.0–34.0)
MCHC: 32.2 g/dL (ref 30.0–36.0)
MCV: 87.7 fL (ref 80.0–100.0)
MONOS PCT: 15 %
Monocytes Absolute: 1.1 10*3/uL — ABNORMAL HIGH (ref 0.1–1.0)
NEUTROS ABS: 4.7 10*3/uL (ref 1.7–7.7)
NEUTROS PCT: 63 %
NRBC: 0 % (ref 0.0–0.2)
Platelets: 282 10*3/uL (ref 150–400)
RBC: 4.14 MIL/uL (ref 3.87–5.11)
RDW: 14.7 % (ref 11.5–15.5)
WBC: 7.4 10*3/uL (ref 4.0–10.5)

## 2017-12-25 LAB — IRON AND TIBC
IRON: 37 ug/dL (ref 28–170)
Saturation Ratios: 12 % (ref 10.4–31.8)
TIBC: 301 ug/dL (ref 250–450)
UIBC: 264 ug/dL

## 2017-12-25 LAB — FERRITIN: Ferritin: 39 ng/mL (ref 11–307)

## 2018-03-28 ENCOUNTER — Ambulatory Visit: Payer: Medicare Other | Admitting: Oncology

## 2018-03-28 ENCOUNTER — Inpatient Hospital Stay: Payer: Medicare Other

## 2018-03-28 ENCOUNTER — Other Ambulatory Visit: Payer: Medicare Other

## 2018-03-28 ENCOUNTER — Inpatient Hospital Stay: Payer: Medicare Other | Admitting: Oncology

## 2018-06-19 ENCOUNTER — Inpatient Hospital Stay
Admission: EM | Admit: 2018-06-19 | Discharge: 2018-06-21 | DRG: 378 | Disposition: A | Discharge status: Home / Self Care | Payer: Medicare Other | Attending: Internal Medicine | Admitting: Internal Medicine

## 2018-06-19 ENCOUNTER — Encounter: Payer: Self-pay | Admitting: Emergency Medicine

## 2018-06-19 ENCOUNTER — Other Ambulatory Visit: Payer: Self-pay

## 2018-06-19 DIAGNOSIS — K921 Melena: Secondary | ICD-10-CM | POA: Diagnosis present

## 2018-06-19 DIAGNOSIS — Z79899 Other long term (current) drug therapy: Secondary | ICD-10-CM | POA: Diagnosis not present

## 2018-06-19 DIAGNOSIS — Z8249 Family history of ischemic heart disease and other diseases of the circulatory system: Secondary | ICD-10-CM

## 2018-06-19 DIAGNOSIS — K31811 Angiodysplasia of stomach and duodenum with bleeding: Secondary | ICD-10-CM | POA: Diagnosis present

## 2018-06-19 DIAGNOSIS — Z791 Long term (current) use of non-steroidal anti-inflammatories (NSAID): Secondary | ICD-10-CM | POA: Diagnosis not present

## 2018-06-19 DIAGNOSIS — Z1159 Encounter for screening for other viral diseases: Secondary | ICD-10-CM | POA: Diagnosis not present

## 2018-06-19 DIAGNOSIS — K219 Gastro-esophageal reflux disease without esophagitis: Secondary | ICD-10-CM | POA: Diagnosis present

## 2018-06-19 DIAGNOSIS — D509 Iron deficiency anemia, unspecified: Secondary | ICD-10-CM | POA: Diagnosis present

## 2018-06-19 DIAGNOSIS — E785 Hyperlipidemia, unspecified: Secondary | ICD-10-CM | POA: Diagnosis present

## 2018-06-19 DIAGNOSIS — K922 Gastrointestinal hemorrhage, unspecified: Secondary | ICD-10-CM

## 2018-06-19 DIAGNOSIS — Z886 Allergy status to analgesic agent status: Secondary | ICD-10-CM

## 2018-06-19 DIAGNOSIS — Z888 Allergy status to other drugs, medicaments and biological substances status: Secondary | ICD-10-CM | POA: Diagnosis not present

## 2018-06-19 DIAGNOSIS — D62 Acute posthemorrhagic anemia: Secondary | ICD-10-CM | POA: Diagnosis present

## 2018-06-19 DIAGNOSIS — I10 Essential (primary) hypertension: Secondary | ICD-10-CM | POA: Diagnosis present

## 2018-06-19 DIAGNOSIS — H409 Unspecified glaucoma: Secondary | ICD-10-CM | POA: Diagnosis present

## 2018-06-19 DIAGNOSIS — J449 Chronic obstructive pulmonary disease, unspecified: Secondary | ICD-10-CM | POA: Diagnosis present

## 2018-06-19 DIAGNOSIS — Z87891 Personal history of nicotine dependence: Secondary | ICD-10-CM | POA: Diagnosis not present

## 2018-06-19 DIAGNOSIS — Z90710 Acquired absence of both cervix and uterus: Secondary | ICD-10-CM | POA: Diagnosis not present

## 2018-06-19 DIAGNOSIS — Z885 Allergy status to narcotic agent status: Secondary | ICD-10-CM

## 2018-06-19 DIAGNOSIS — Z66 Do not resuscitate: Secondary | ICD-10-CM | POA: Diagnosis present

## 2018-06-19 DIAGNOSIS — D500 Iron deficiency anemia secondary to blood loss (chronic): Secondary | ICD-10-CM

## 2018-06-19 LAB — COMPREHENSIVE METABOLIC PANEL WITH GFR
ALT: 15 U/L (ref 0–44)
AST: 24 U/L (ref 15–41)
Albumin: 3.8 g/dL (ref 3.5–5.0)
Alkaline Phosphatase: 71 U/L (ref 38–126)
Anion gap: 8 (ref 5–15)
BUN: 19 mg/dL (ref 8–23)
CO2: 24 mmol/L (ref 22–32)
Calcium: 8.8 mg/dL — ABNORMAL LOW (ref 8.9–10.3)
Chloride: 105 mmol/L (ref 98–111)
Creatinine, Ser: 0.56 mg/dL (ref 0.44–1.00)
GFR calc Af Amer: >60 mL/min
GFR calc non Af Amer: >60 mL/min
Glucose, Bld: 95 mg/dL (ref 70–99)
Potassium: 4.1 mmol/L (ref 3.5–5.1)
Sodium: 137 mmol/L (ref 135–145)
Total Bilirubin: 0.5 mg/dL (ref 0.3–1.2)
Total Protein: 7.3 g/dL (ref 6.5–8.1)

## 2018-06-19 LAB — CBC
HCT: 24.2 % — ABNORMAL LOW (ref 36.0–46.0)
Hemoglobin: 7.3 g/dL — ABNORMAL LOW (ref 12.0–15.0)
MCH: 21.7 pg — ABNORMAL LOW (ref 26.0–34.0)
MCHC: 30.2 g/dL (ref 30.0–36.0)
MCV: 72 fL — ABNORMAL LOW (ref 80.0–100.0)
Platelets: 387 10*3/uL (ref 150–400)
RBC: 3.36 MIL/uL — ABNORMAL LOW (ref 3.87–5.11)
RDW: 15.7 % — ABNORMAL HIGH (ref 11.5–15.5)
WBC: 7.1 10*3/uL (ref 4.0–10.5)
nRBC: 0 % (ref 0.0–0.2)

## 2018-06-19 LAB — IRON AND TIBC
Iron: 13 ug/dL — ABNORMAL LOW (ref 28–170)
Saturation Ratios: 3 % — ABNORMAL LOW (ref 10.4–31.8)
TIBC: 481 ug/dL — ABNORMAL HIGH (ref 250–450)
UIBC: 468 ug/dL

## 2018-06-19 LAB — SARS CORONAVIRUS 2 BY RT PCR (HOSPITAL ORDER, PERFORMED IN ~~LOC~~ HOSPITAL LAB): SARS Coronavirus 2: NEGATIVE

## 2018-06-19 LAB — PREPARE RBC (CROSSMATCH)

## 2018-06-19 MED ORDER — SODIUM CHLORIDE 0.9 % IV SOLN
INTRAVENOUS | Status: AC
  Administered 2018-06-20: 04:00:00 via INTRAVENOUS

## 2018-06-19 MED ORDER — ACETAMINOPHEN 325 MG PO TABS
650.0000 mg | ORAL_TABLET | Freq: Four times a day (QID) | ORAL | Status: DC | PRN
  Administered 2018-06-19 – 2018-06-20 (×2): 650 mg via ORAL
  Filled 2018-06-19 (×2): qty 2

## 2018-06-19 MED ORDER — ONDANSETRON HCL 4 MG PO TABS: 4.0000 mg | ORAL_TABLET | Freq: Four times a day (QID) | ORAL | Status: DC | PRN

## 2018-06-19 MED ORDER — PANTOPRAZOLE SODIUM 40 MG IV SOLR
40.0000 mg | Freq: Two times a day (BID) | INTRAVENOUS | Status: DC
  Administered 2018-06-19 – 2018-06-21 (×4): 40 mg via INTRAVENOUS
  Filled 2018-06-19 (×4): qty 40

## 2018-06-19 MED ORDER — ACETAMINOPHEN 650 MG RE SUPP: 650.0000 mg | Freq: Four times a day (QID) | RECTAL | Status: DC | PRN

## 2018-06-19 MED ORDER — ONDANSETRON HCL 4 MG/2ML IJ SOLN: 4.0000 mg | Freq: Four times a day (QID) | INTRAMUSCULAR | Status: DC | PRN

## 2018-06-19 MED ORDER — SODIUM CHLORIDE 0.9 % IV SOLN
10.0000 mL/h | Freq: Once | INTRAVENOUS | Status: AC
  Administered 2018-06-19: 10 mL/h via INTRAVENOUS

## 2018-06-19 NOTE — H&P (Signed)
[facility] [place] NAME: [name] [name]    MR#:  [id number]  DATE OF BIRTH:  [birth year]  DATE OF ADMISSION:  06/19/2018  PRIMARY CARE PHYSICIAN: Kirk Ruths, MD   REQUESTING/REFERRING PHYSICIAN: Jimmye Norman, MD  CHIEF COMPLAINT:   Chief Complaint  Patient presents with  . transfusion    HISTORY OF PRESENT ILLNESS:  [name] [name]  is a 85 y.o. female who presents with chief complaint as above.  Patient presents the ED with a complaint of melena, which has been significantly worse for the past 2 days.  She states that she has had diagnosed upper GI bleed multiple times in the past, and had multiple endoscopies.  She describes what may have been a dual Foy lesion on one endoscopy.  Today her hemoglobin is down to 7, from apparent baseline of 11 in the recent past.  She was ordered 1 unit of packed red blood cells in the ED and hospitalist were called for admission  PAST MEDICAL HISTORY:   Past Medical History:  Diagnosis Date  . Anemia   . COPD (chronic obstructive pulmonary disease) ([place])   . Diverticulitis   . GERD (gastroesophageal reflux disease)   . Glaucoma   . HLD (hyperlipidemia)   . Hypertension   . Iron deficiency   . Rectal bleeding      PAST SURGICAL HISTORY:   Past Surgical History:  Procedure Laterality Date  . ABDOMINAL HYSTERECTOMY    . APPENDECTOMY    . COLONOSCOPY WITH PROPOFOL N/A 04/14/2016   Procedure: COLONOSCOPY WITH PROPOFOL;  Surgeon: Lucilla Lame, MD;  Location: [facility] ENDOSCOPY;  Service: Endoscopy;  Laterality: N/A;  . ENTEROSCOPY N/A 05/10/2016   Procedure: Push ENTEROSCOPY with pediatric colonoscope;  Surgeon: [name], MD;  Location: [facility] ENDOSCOPY;  Service: Endoscopy;  Laterality: N/A;  . ENTEROSCOPY N/A 05/30/2016   Procedure: push ENTEROSCOPY;  Surgeon: [name], MD;  Location: [facility] ENDOSCOPY;  Service: Endoscopy;  Laterality: N/A;  . ENTEROSCOPY N/A 07/05/2017   Procedure:  ENTEROSCOPY;  Surgeon: [name], MD;  Location: [facility] ENDOSCOPY;  Service: Gastroenterology;  Laterality: N/A;  . ESOPHAGOGASTRODUODENOSCOPY (EGD) WITH PROPOFOL N/A 08/12/2015   Procedure: ESOPHAGOGASTRODUODENOSCOPY (EGD) WITH PROPOFOL;  Surgeon: Lollie Sails, MD;  Location: [facility] ENDOSCOPY;  Service: Endoscopy;  Laterality: N/A;  . ESOPHAGOGASTRODUODENOSCOPY (EGD) WITH PROPOFOL N/A 04/03/2016   Procedure: ESOPHAGOGASTRODUODENOSCOPY (EGD) WITH PROPOFOL;  Surgeon: Lucilla Lame, MD;  Location: [facility] ENDOSCOPY;  Service: Endoscopy;  Laterality: N/A;  . GIVENS CAPSULE STUDY N/A 04/28/2016   Procedure: GIVENS CAPSULE STUDY;  Surgeon: [name], MD;  Location: [facility] ENDOSCOPY;  Service: Endoscopy;  Laterality: N/A;  . GIVENS CAPSULE STUDY N/A 06/21/2017   Procedure: GIVENS CAPSULE STUDY;  Surgeon: [name], MD;  Location: [facility] ENDOSCOPY;  Service: Gastroenterology;  Laterality: N/A;  . JOINT REPLACEMENT       SOCIAL HISTORY:   Social History   Tobacco Use  . Smoking status: Former Smoker    Types: Cigarettes  . Smokeless tobacco: Never Used  Substance Use Topics  . Alcohol use: No     FAMILY HISTORY:   Family History  Problem Relation Age of Onset  . CAD Mother   . CAD Father   . CAD Brother      DRUG ALLERGIES:   Allergies  Allergen Reactions  . Nsaids Hives  . Codeine Nausea And Vomiting  . Dextrans Hives  . Fentanyl Itching  . Lipitor [Atorvastatin] Other (See Comments)  Reaction: Muscle pain  . Lovastatin   . Mobic [Meloxicam] Other (See Comments)    Reaction: Mouth and tongue ulcers  . Niacin And Related Itching  . Other   . Polysaccharide K Hives  . Pravachol [Pravastatin Sodium] Other (See Comments)    Reaction: Muscle pain  . Statins Other (See Comments)  . Tolmetin Hives  . Voltaren [Diclofenac Sodium] Other (See Comments)    Reaction: Mouth and tongue ulcers  . Zetia [Ezetimibe] Other (See Comments)    Reaction: Leg pain  . Niacin Itching     MEDICATIONS AT HOME:   Prior to Admission medications   Medication Sig Start Date End Date Taking? Authorizing Provider  acetaminophen (TYLENOL) 650 MG CR tablet Take 650 mg by mouth every 8 (eight) hours as needed for pain.    [provider]  albuterol (PROVENTIL HFA;VENTOLIN HFA) 108 (90 Base) MCG/ACT inhaler Inhale 2 puffs into the lungs every 4 (four) hours as needed for wheezing or shortness of breath.    [provider]  chlorthalidone (HYGROTON) 25 MG tablet Take 12.5 mg by mouth daily.  01/18/16   [provider]  Coenzyme Q10 10 MG capsule Take 10 mg by mouth daily.     [provider]  COENZYME Q10 PO Take 1 tablet by mouth daily.    [provider]  colchicine 0.6 MG tablet Take 0.6 mg by mouth daily.  08/28/13   [provider]  Cyanocobalamin (B-12) 1000 MCG CAPS Take 1,000 Units by mouth 2 (two) times daily.     [provider]  cycloSPORINE (RESTASIS) 0.05 % ophthalmic emulsion Place 1 drop into both eyes 2 (two) times daily.    [provider]  fluticasone (FLONASE) 50 MCG/ACT nasal spray Place 1 spray into both nostrils daily as needed.     [provider]  gabapentin (NEURONTIN) 300 MG capsule Take 300 mg by mouth 2 (two) times daily.    [provider]  guaiFENesin (MUCINEX) 600 MG 12 hr tablet Take 600 mg by mouth 2 (two) times daily as needed.    [provider]  hydrocortisone (PROCTOSOL HC) 2.5 % rectal cream PLACE RECTALLY TWICE A DAY 10/13/15   [provider]  loratadine (CLARITIN) 10 MG tablet Take 10 mg by mouth daily as needed for allergies.    [provider]  losartan (COZAAR) 50 MG tablet Take 50 mg by mouth daily.    [provider]  menthol-zinc oxide (GOLD BOND) powder Apply topically once daily as needed (PRN under breasts for yeast).    [provider]  mometasone (ELOCON) 0.1 % lotion Apply topically.    [provider]  Multiple Vitamins-Minerals (OCUVITE PRESERVISION PO) Take 1 tablet by mouth 2 (two) times daily.    [provider]  Multiple Vitamins-Minerals (OCUVITE PRESERVISION PO) Take by mouth.    [provider]  PARoxetine (PAXIL) 40 MG tablet Take 40 mg by mouth every morning.    [provider]  Polyethyl Glycol-Propyl Glycol (SYSTANE) 0.4-0.3 % SOLN Apply 1 drop to eye as needed.    [provider]  potassium chloride (K-DUR) 10 MEQ tablet Take 10 mEq by mouth daily.  04/30/17   [provider]  potassium chloride (K-DUR,KLOR-CON) 10 MEQ tablet Take 10 mEq by mouth 2 (two) times daily.     [provider]  PROPYLENE GLYCOL-GLYCERIN OP Apply to eye.    [provider]  RABEprazole (ACIPHEX) 20 MG tablet Take  20 mg by mouth 2 (two) times daily.    [provider]  Spacer/Aero-Holding Chambers (E-Z SPACER) inhaler Use as instructed. 11/05/15   [provider]  sucralfate (CARAFATE) 1 g tablet Take 1 tablet (1 g total) by mouth 4 (four) times daily -  with meals and at bedtime. 08/13/15   Dustin Flock, MD  Tetrahydroz-Dextran-PEG-Povid (EYE DROPS ADVANCED RELIEF OP) Apply to eye.    [provider]  timolol (TIMOPTIC-XR) 0.5 % ophthalmic gel-forming Place 1 drop into both eyes daily.    [provider]  valACYclovir (VALTREX) 1000 MG tablet once daily as needed. Reported on 03/22/2015 01/18/13   [provider]    REVIEW OF SYSTEMS:  Review of Systems  Constitutional: Positive for malaise/fatigue. Negative for chills, fever and weight loss.  HENT: Negative for ear pain, hearing loss and tinnitus.   Eyes: Negative for blurred vision, double vision, pain and redness.  Respiratory: Negative for cough, hemoptysis and shortness of breath.   Cardiovascular: Negative for chest pain, palpitations, orthopnea and leg swelling.  Gastrointestinal: Negative for abdominal pain, constipation,  diarrhea, nausea and vomiting.  Genitourinary: Negative for dysuria, frequency and hematuria.  Musculoskeletal: Negative for back pain, joint pain and neck pain.  Skin:       No acne, rash, or lesions  Neurological: Negative for dizziness, tremors, focal weakness and weakness.  Endo/Heme/Allergies: Negative for polydipsia. Does not bruise/bleed easily.  Psychiatric/Behavioral: Negative for depression. The patient is not nervous/anxious and does not have insomnia.      VITAL SIGNS:   Vitals:   06/19/18 1734 06/19/18 1735  BP:  (!) 131/55  Pulse:  79  Resp:  16  Temp:  98.4 F (36.9 C)  TempSrc:  Oral  SpO2:  96%  Weight: 85.3 kg    Wt Readings from Last 3 Encounters:  06/19/18 85.3 kg  09/25/17 85.3 kg  07/05/17 83.9 kg    PHYSICAL EXAMINATION:  Physical Exam  Vitals reviewed. Constitutional: She is oriented to person, place, and time. She appears well-developed and well-nourished. No distress.  HENT:  Head: Normocephalic and atraumatic.  Mouth/Throat: Oropharynx is clear and moist.  Eyes: Pupils are equal, round, and reactive to light. EOM are normal. No scleral icterus.  Pale conjunctiva  Neck: Normal range of motion. Neck supple. No JVD present. No thyromegaly present.  Cardiovascular: Normal rate, regular rhythm and intact distal pulses. Exam reveals no gallop and no friction rub.  No murmur heard. Respiratory: Effort normal and breath sounds normal. No respiratory distress. She has no wheezes. She has no rales.  GI: Soft. Bowel sounds are normal. She exhibits no distension. There is no abdominal tenderness.  Musculoskeletal: Normal range of motion.        General: No edema.     Comments: No arthritis, no gout  Lymphadenopathy:    She has no cervical adenopathy.  Neurological: She is alert and oriented to person, place, and time. No cranial nerve deficit.  No dysarthria, no aphasia  Skin: Skin is warm and dry. No rash noted. No erythema.  Psychiatric: She has a  normal mood and affect. Her behavior is normal. Judgment and thought content normal.    LABORATORY PANEL:   CBC Recent Labs  Lab 06/19/18 1744  WBC 7.1  HGB 7.3*  HCT 24.2*  PLT 387   ------------------------------------------------------------------------------------------------------------------  Chemistries  Recent Labs  Lab 06/19/18 1744  NA 137  K 4.1  CL 105  CO2 24  GLUCOSE 95  BUN  19  CREATININE 0.56  CALCIUM 8.8*  AST 24  ALT 15  ALKPHOS 71  BILITOT 0.5   ------------------------------------------------------------------------------------------------------------------  Cardiac Enzymes No results for input(s): TROPONINI in the last 168 hours. ------------------------------------------------------------------------------------------------------------------  RADIOLOGY:  No results found.  EKG:   Orders placed or performed during the hospital encounter of 06/26/17  . ED EKG  . ED EKG  . EKG    IMPRESSION AND PLAN:  Principal Problem:   Melena -IV PPI, n.p.o. for now, PRBC as ordered by ED physician, monitor hemoglobin, GI consult Active Problems:   HTN (hypertension), benign -continue home meds   Chronic obstructive pulmonary disease ([place]) -continue home medications   HLD (hyperlipidemia) -home dose antilipid   GERD (gastroesophageal reflux disease) -PPI as above  Chart review performed and case discussed with ED provider. Labs, imaging and/or ECG reviewed by provider and discussed with patient/family. Management plans discussed with the patient and/or family.  COVID-19 status: Test pending  DVT PROPHYLAXIS: Mechanical only  GI PROPHYLAXIS:  PPI   ADMISSION STATUS: Inpatient     CODE STATUS: DNR Code Status History    Date Active Date Inactive Code Status Order ID Comments User Context   05/30/2016 1239 05/31/2016 1624 DNR [id number]  Loletha Grayer, MD ED   05/09/2016 1615 05/12/2016 1904 Full Code [id number]  Demetrios Loll, MD Inpatient    04/12/2016 1716 04/15/2016 1610 Full Code [id number]  Bettey Costa, MD Inpatient   03/31/2016 2257 04/04/2016 1741 Partial Code [id number]  Harrie Foreman, MD Inpatient   03/31/2016 1925 03/31/2016 2257 Full Code [id number]  Loletha Grayer, MD ED   10/10/2015 2232 10/13/2015 2147 Full Code [id number]  Harvie Bridge, DO ED   08/11/2015 0141 08/13/2015 1858 Full Code [id number]  Lance Coon, MD Inpatient    Questions for Most Recent Historical Code Status (Order [id number])    Question Answer Comment   In the event of cardiac or respiratory ARREST Do not call a "code blue"    In the event of cardiac or respiratory ARREST Do not perform Intubation, CPR, defibrillation or ACLS    In the event of cardiac or respiratory ARREST Use medication by any route, position, wound care, and other measures to relive pain and suffering. May use oxygen, suction and manual treatment of airway obstruction as needed for comfort.    Comments nurse may pronounce       TOTAL TIME TAKING CARE OF THIS PATIENT: 45 minutes.   This patient was evaluated in the context of the global COVID-19 pandemic, which necessitated consideration that the patient might be at risk for infection with the SARS-CoV-2 virus that causes COVID-19. Institutional protocols and algorithms that pertain to the evaluation of patients at risk for COVID-19 are in a state of rapid change based on information released by regulatory bodies including the CDC and federal and state organizations. These policies and algorithms were followed to the best of this provider's knowledge to date during the patient's care at this facility.  Ethlyn Daniels 06/19/2018, 9:11 PM  [facility]  Office  [phone]  CC: Primary care physician; Kirk Ruths, MD  Note:  This document was prepared using Dragon voice recognition software and may include unintentional dictation errors.

## 2018-06-19 NOTE — ED Provider Notes (Addendum)
[facility] Emergency Department Provider Note       Time seen: ----------------------------------------- 8:29 PM on 06/19/2018 -----------------------------------------   I have reviewed the triage vital signs and the nursing notes.  HISTORY   Chief Complaint transfusion    HPI [name] [name] is a 85 y.o. female with a history of anemia, COPD, diverticulitis, GERD, hyperlipidemia, hypertension, iron deficiency, rectal bleeding who presents to the ED for anemia.  Patient had blood work drawn as an outpatient today and was referred to the ER for possible transfusion.  She has had a history of gastrointestinal bleeding and has had black stools for the past 2 months.  She has been feeling weak.  She also reports she is under significant amount of stress.  Past Medical History:  Diagnosis Date  . Anemia   . COPD (chronic obstructive pulmonary disease) ([place])   . Diverticulitis   . GERD (gastroesophageal reflux disease)   . Glaucoma   . HLD (hyperlipidemia)   . Hypertension   . Iron deficiency   . Rectal bleeding     Patient Active Problem List   Diagnosis Date Noted  . Fibromyositis 06/12/2017  . Pain 06/12/2017  . Healthcare maintenance 01/17/2017  . Anemia 05/30/2016  . Angiodysplasia of intestinal tract   . Blood in stool   . Benign neoplasm of ascending colon   . UGIB (upper gastrointestinal bleed)   . GIB (gastrointestinal bleeding) 04/12/2016  . Melena   . Healthcare-associated pertussis 10/10/2015  . Healthcare-associated pneumonia 10/10/2015  . GI bleed 08/10/2015  . Iron deficiency anemia due to chronic blood loss 08/10/2015  . HTN (hypertension), benign 08/10/2015  . Other and unspecified hyperlipidemia 08/10/2015  . GERD (gastroesophageal reflux disease) 08/10/2015  . Chronic obstructive pulmonary disease ([place]) 08/10/2015  . Glaucoma suspect 12/26/2012  . Fatigue 02/12/2012  . H/O total hip arthroplasty 01/25/2012  . Total  knee replacement status 01/25/2012  . Arthritis of knee 01/18/2012  . ARMD (age related macular degeneration) 11/21/2011  . Foot drop 11/15/2011  . Hallux valgus 11/15/2011  . Hammer toe, acquired 11/15/2011  . Recurrent major depressive disorder, in partial remission ([place]) 07/10/2011  . Degenerative disc disease 04/11/2011  . Fibromyalgia 02/10/2011  . Diverticulosis of colon 07/18/2010  . Gastric ulcer 07/18/2010  . Hereditary and idiopathic peripheral neuropathy 07/18/2010  . Meniere's disease 07/18/2010  . Osteoarthritis, generalized 07/18/2010  . Hemorrhoids 07/18/2010    Past Surgical History:  Procedure Laterality Date  . ABDOMINAL HYSTERECTOMY    . APPENDECTOMY    . COLONOSCOPY WITH PROPOFOL N/A 04/14/2016   Procedure: COLONOSCOPY WITH PROPOFOL;  Surgeon: Lucilla Lame, MD;  Location: [facility] ENDOSCOPY;  Service: Endoscopy;  Laterality: N/A;  . ENTEROSCOPY N/A 05/10/2016   Procedure: Push ENTEROSCOPY with pediatric colonoscope;  Surgeon: [name], MD;  Location: [facility] ENDOSCOPY;  Service: Endoscopy;  Laterality: N/A;  . ENTEROSCOPY N/A 05/30/2016   Procedure: push ENTEROSCOPY;  Surgeon: [name], MD;  Location: [facility] ENDOSCOPY;  Service: Endoscopy;  Laterality: N/A;  . ENTEROSCOPY N/A 07/05/2017   Procedure: ENTEROSCOPY;  Surgeon: [name], MD;  Location: [facility] ENDOSCOPY;  Service: Gastroenterology;  Laterality: N/A;  . ESOPHAGOGASTRODUODENOSCOPY (EGD) WITH PROPOFOL N/A 08/12/2015   Procedure: ESOPHAGOGASTRODUODENOSCOPY (EGD) WITH PROPOFOL;  Surgeon: Lollie Sails, MD;  Location: [facility] ENDOSCOPY;  Service: Endoscopy;  Laterality: N/A;  . ESOPHAGOGASTRODUODENOSCOPY (EGD) WITH PROPOFOL N/A 04/03/2016   Procedure: ESOPHAGOGASTRODUODENOSCOPY (EGD) WITH PROPOFOL;  Surgeon: Lucilla Lame, MD;  Location: [facility] ENDOSCOPY;  Service: Endoscopy;  Laterality: N/A;  .  GIVENS CAPSULE STUDY N/A 04/28/2016   Procedure: GIVENS CAPSULE STUDY;  Surgeon: [name], MD;  Location: [facility] ENDOSCOPY;   Service: Endoscopy;  Laterality: N/A;  . GIVENS CAPSULE STUDY N/A 06/21/2017   Procedure: GIVENS CAPSULE STUDY;  Surgeon: [name], MD;  Location: [facility] ENDOSCOPY;  Service: Gastroenterology;  Laterality: N/A;  . JOINT REPLACEMENT      Allergies Nsaids; Codeine; Dextrans; Fentanyl; Lipitor [atorvastatin]; Lovastatin; Mobic [meloxicam]; Niacin and related; Other; Polysaccharide k; Pravachol [pravastatin sodium]; Statins; Tolmetin; Voltaren [diclofenac sodium]; Zetia [ezetimibe]; and Niacin  Social History Social History   Tobacco Use  . Smoking status: Former Smoker    Types: Cigarettes  . Smokeless tobacco: Never Used  Substance Use Topics  . Alcohol use: No  . Drug use: No   Review of Systems Constitutional: Negative for fever. Cardiovascular: Negative for chest pain. Respiratory: Negative for shortness of breath. Gastrointestinal: Negative for abdominal pain, vomiting and diarrhea.  Positive for black stools Musculoskeletal: Negative for back pain. Skin: Negative for rash. Neurological: Positive for weakness  All systems negative/normal/unremarkable except as stated in the HPI  ____________________________________________   PHYSICAL EXAM:  VITAL SIGNS: ED Triage Vitals  Enc Vitals Group     BP 06/19/18 1735 (!) 131/55     Pulse Rate 06/19/18 1735 79     Resp 06/19/18 1735 16     Temp 06/19/18 1735 98.4 F (36.9 C)     Temp Source 06/19/18 1735 Oral     SpO2 06/19/18 1735 96 %     Weight 06/19/18 1734 188 lb 0.8 oz (85.3 kg)     Height --      Head Circumference --      Peak Flow --      Pain Score 06/19/18 1734 0     Pain Loc --      Pain Edu? --      Excl. in [place]? --    Constitutional: Alert and oriented. Well appearing and in no distress. Eyes: Conjunctivae are pale. Normal extraocular movements. Cardiovascular: Normal rate, regular rhythm. No murmurs, rubs, or gallops. Respiratory: Normal respiratory effort without tachypnea nor retractions. Breath  sounds are clear and equal bilaterally. No wheezes/rales/rhonchi. Gastrointestinal: Soft and nontender. Normal bowel sounds Musculoskeletal: Nontender with normal range of motion in extremities. No lower extremity tenderness nor edema. Neurologic:  Normal speech and language. No gross focal neurologic deficits are appreciated.  Skin:  Skin is warm, dry and intact. No rash noted. Psychiatric: Mood and affect are normal. Speech and behavior are normal.  ____________________________________________  ED COURSE:  As part of my medical decision making, I reviewed the following data within the [place] History obtained from family if available, nursing notes, old chart and ekg, as well as notes from prior ED visits. Patient presented for gastrointestinal bleeding and possible need for transfusion, we will assess with labs as indicated at this time.   Procedures  Alyxandra Tenbrink Mulkern was evaluated in Emergency Department on 06/19/2018 for the symptoms described in the history of present illness. She was evaluated in the context of the global COVID-19 pandemic, which necessitated consideration that the patient might be at risk for infection with the SARS-CoV-2 virus that causes COVID-19. Institutional protocols and algorithms that pertain to the evaluation of patients at risk for COVID-19 are in a state of rapid change based on information released by regulatory bodies including the CDC and federal and state organizations. These policies and algorithms were followed during the patient's care in  the ED.  ____________________________________________   LABS (pertinent positives/negatives)  Labs Reviewed  COMPREHENSIVE METABOLIC PANEL - Abnormal; Notable for the following components:      Result Value   Calcium 8.8 (*)    All other components within normal limits  CBC - Abnormal; Notable for the following components:   RBC 3.36 (*)    Hemoglobin 7.3 (*)    HCT 24.2 (*)    MCV 72.0  (*)    MCH 21.7 (*)    RDW 15.7 (*)    All other components within normal limits  IRON AND TIBC  POC OCCULT BLOOD, ED  TYPE AND SCREEN  PREPARE RBC (CROSSMATCH)  ___________________________________________   CRITICAL CARE Performed by: Laurence Aly   Total critical care time: 30 minutes  Critical care time was exclusive of separately billable procedures and treating other patients.  Critical care was necessary to treat or prevent imminent or life-threatening deterioration.  Critical care was time spent personally by me on the following activities: development of treatment plan with patient and/or surrogate as well as nursing, discussions with consultants, evaluation of patient's response to treatment, examination of patient, obtaining history from patient or surrogate, ordering and performing treatments and interventions, ordering and review of laboratory studies, ordering and review of radiographic studies, pulse oximetry and re-evaluation of patient's condition.   DIFFERENTIAL DIAGNOSIS   Gastrointestinal bleeding, anemia, occult infection  FINAL ASSESSMENT AND PLAN  Gastrointestinal bleeding, anemia   Plan: The patient had presented for anemia with a history of black stools for 2 months. Patient's labs did indicate a significant drop in her hemoglobin compared to 6 months ago.  She has dropped down to 7.3.  I have ordered a unit of blood for her.  Her hematologist and gastroenterologist are here at [facility].  I will discuss with the hospitalist for admission.   Laurence Aly, MD    Note: This note was generated in part or whole with voice recognition software. Voice recognition is usually quite accurate but there are transcription errors that can and very often do occur. I apologize for any typographical errors that were not detected and corrected.     Earleen Newport, MD 06/19/18 2031    Earleen Newport, MD 07/01/18 [phone]

## 2018-06-19 NOTE — ED Notes (Signed)
Report called to [facility] RN pt admitted to 127

## 2018-06-19 NOTE — ED Notes (Signed)
ED TO INPATIENT HANDOFF REPORT  ED Nurse Name and Phone #: Gershon Mussel [phone]  S Name/Age/Gender [name] [name] 84 y.o. female Room/Bed: ED09A/ED09A  Code Status   Code Status: Prior  Home/SNF/Other Home Patient oriented to: self, place, time and situation Is this baseline? Yes   Triage Complete: Triage complete  Chief Complaint referred by [facility] Transfusion  Triage Note Had bloodwork drawn today and referred to ED for transfusion.  H/H 7.5/ 25.2.  Patient states she has been having black stools for about 2- months.  Patient AAOx3.  Skin warm and dry.  NAD   Allergies Allergies  Allergen Reactions  . Nsaids Hives  . Codeine Nausea And Vomiting  . Dextrans Hives  . Fentanyl Itching  . Lipitor [Atorvastatin] Other (See Comments)    Reaction: Muscle pain  . Lovastatin   . Mobic [Meloxicam] Other (See Comments)    Reaction: Mouth and tongue ulcers  . Niacin And Related Itching  . Other   . Polysaccharide K Hives  . Pravachol [Pravastatin Sodium] Other (See Comments)    Reaction: Muscle pain  . Statins Other (See Comments)  . Tolmetin Hives  . Voltaren [Diclofenac Sodium] Other (See Comments)    Reaction: Mouth and tongue ulcers  . Zetia [Ezetimibe] Other (See Comments)    Reaction: Leg pain  . Niacin Itching    Level of Care/Admitting Diagnosis ED Disposition    ED Disposition Condition [facility]: [place] [[id number]]  Level of Care: Med-Surg [16]  Covid Evaluation: Screening Protocol (No Symptoms)  Diagnosis: Melena [[id number]]  Admitting Physician: Lance Coon [[id number]]  Attending Physician: Jannifer Franklin, DAVID [phone]  Estimated length of stay: past midnight tomorrow  Certification:: I certify this patient will need inpatient services for at least 2 midnights  PT Class (Do Not Modify): Inpatient [101]  PT Acc Code (Do Not Modify): Private [1]       B Medical/Surgery History Past Medical History:  Diagnosis Date  .  Anemia   . COPD (chronic obstructive pulmonary disease) ([place])   . Diverticulitis   . GERD (gastroesophageal reflux disease)   . Glaucoma   . HLD (hyperlipidemia)   . Hypertension   . Iron deficiency   . Rectal bleeding    Past Surgical History:  Procedure Laterality Date  . ABDOMINAL HYSTERECTOMY    . APPENDECTOMY    . COLONOSCOPY WITH PROPOFOL N/A 04/14/2016   Procedure: COLONOSCOPY WITH PROPOFOL;  Surgeon: Lucilla Lame, MD;  Location: [facility] ENDOSCOPY;  Service: Endoscopy;  Laterality: N/A;  . ENTEROSCOPY N/A 05/10/2016   Procedure: Push ENTEROSCOPY with pediatric colonoscope;  Surgeon: [name], MD;  Location: [facility] ENDOSCOPY;  Service: Endoscopy;  Laterality: N/A;  . ENTEROSCOPY N/A 05/30/2016   Procedure: push ENTEROSCOPY;  Surgeon: [name], MD;  Location: [facility] ENDOSCOPY;  Service: Endoscopy;  Laterality: N/A;  . ENTEROSCOPY N/A 07/05/2017   Procedure: ENTEROSCOPY;  Surgeon: [name], MD;  Location: [facility] ENDOSCOPY;  Service: Gastroenterology;  Laterality: N/A;  . ESOPHAGOGASTRODUODENOSCOPY (EGD) WITH PROPOFOL N/A 08/12/2015   Procedure: ESOPHAGOGASTRODUODENOSCOPY (EGD) WITH PROPOFOL;  Surgeon: Lollie Sails, MD;  Location: [facility] ENDOSCOPY;  Service: Endoscopy;  Laterality: N/A;  . ESOPHAGOGASTRODUODENOSCOPY (EGD) WITH PROPOFOL N/A 04/03/2016   Procedure: ESOPHAGOGASTRODUODENOSCOPY (EGD) WITH PROPOFOL;  Surgeon: Lucilla Lame, MD;  Location: [facility] ENDOSCOPY;  Service: Endoscopy;  Laterality: N/A;  . GIVENS CAPSULE STUDY N/A 04/28/2016   Procedure: GIVENS CAPSULE STUDY;  Surgeon: [name], MD;  Location: [facility] ENDOSCOPY;  Service: Endoscopy;  Laterality: N/A;  . GIVENS CAPSULE STUDY N/A 06/21/2017   Procedure: GIVENS CAPSULE STUDY;  Surgeon: [name], MD;  Location: [facility] ENDOSCOPY;  Service: Gastroenterology;  Laterality: N/A;  . JOINT REPLACEMENT       A IV Location/Drains/Wounds Patient Lines/Drains/Airways Status   Active Line/Drains/Airways    Name:   Placement date:    Placement time:   Site:   Days:   Peripheral IV 06/19/18 Left Hand   06/19/18    2209    Hand   less than 1          Intake/Output Last 24 hours No intake or output data in the 24 hours ending 06/19/18 2220  Labs/Imaging Results for orders placed or performed during the hospital encounter of 06/19/18 (from the past 48 hour(s))  Comprehensive metabolic panel     Status: Abnormal   Collection Time: 06/19/18  5:44 PM  Result Value Ref Range   Sodium 137 135 - 145 mmol/L   Potassium 4.1 3.5 - 5.1 mmol/L   Chloride 105 98 - 111 mmol/L   CO2 24 22 - 32 mmol/L   Glucose, Bld 95 70 - 99 mg/dL   BUN 19 8 - 23 mg/dL   Creatinine, Ser 0.56 0.44 - 1.00 mg/dL   Calcium 8.8 (L) 8.9 - 10.3 mg/dL   Total Protein 7.3 6.5 - 8.1 g/dL   Albumin 3.8 3.5 - 5.0 g/dL   AST 24 15 - 41 U/L   ALT 15 0 - 44 U/L   Alkaline Phosphatase 71 38 - 126 U/L   Total Bilirubin 0.5 0.3 - 1.2 mg/dL   GFR calc non Af Amer >60 >60 mL/min   GFR calc Af Amer >60 >60 mL/min   Anion gap 8 5 - 15    Comment: Performed at [facility], [address] [place], [place] [address]  CBC     Status: Abnormal   Collection Time: 06/19/18  5:44 PM  Result Value Ref Range   WBC 7.1 4.0 - 10.5 K/uL   RBC 3.36 (L) 3.87 - 5.11 MIL/uL   Hemoglobin 7.3 (L) 12.0 - 15.0 g/dL    Comment: Reticulocyte Hemoglobin testing may be clinically indicated, consider ordering this additional test [id number]    HCT 24.2 (L) 36.0 - 46.0 %   MCV 72.0 (L) 80.0 - 100.0 fL   MCH 21.7 (L) 26.0 - 34.0 pg   MCHC 30.2 30.0 - 36.0 g/dL   RDW 15.7 (H) 11.5 - 15.5 %   Platelets 387 150 - 400 K/uL   nRBC 0.0 0.0 - 0.2 %    Comment: Performed at [facility], [address] [place], [place] [address]  Type and screen [place]     Status: None   Collection Time: 06/19/18  5:44 PM  Result Value Ref Range   ABO/RH(D) O NEG    Antibody Screen NEG    Sample Expiration 06/22/2018,2359    PT AG Type       POSITIVE FOR c ANTIGEN Performed at [facility], [address] [place], [place] [address]   Prepare RBC     Status: None (Preliminary result)   Collection Time: 06/19/18  9:00 PM  Result Value Ref Range   Order Confirmation PENDING    No results found.  Pending Labs Unresulted Labs (From admission, onward)    Start     Ordered   06/19/18 2102  SARS Coronavirus 2 (CEPHEID - Performed in [place]  hospital lab), Hosp Order  (Asymptomatic Patients Labs)  ONCE - STAT,   STAT    Question:  Rule Out  Answer:  Yes   06/19/18 2101   06/19/18 2021  Iron and TIBC  ONCE - STAT,   STAT     06/19/18 2020   Signed and Held  Basic metabolic panel  Tomorrow morning,   R     Signed and Held   Signed and Held  CBC  Tomorrow morning,   R     Signed and Held          Vitals/Pain Today's Vitals   06/19/18 1734 06/19/18 1735 06/19/18 2204  BP:  (!) 131/55 (!) 180/88  Pulse:  79 82  Resp:  16 20  Temp:  98.4 F (36.9 C)   TempSrc:  Oral   SpO2:  96% 98%  Weight: 85.3 kg    PainSc: 0-No pain  0-No pain    Isolation Precautions No active isolations  Medications Medications  acetaminophen (TYLENOL) tablet 650 mg (has no administration in time range)    Or  acetaminophen (TYLENOL) suppository 650 mg (has no administration in time range)  pantoprazole (PROTONIX) injection 40 mg (40 mg Intravenous Given 06/19/18 2210)  0.9 %  sodium chloride infusion (10 mL/hr Intravenous New Bag/Given 06/19/18 2210)    Mobility walks with device Low fall risk   Focused Assessments blood trans   R Recommendations: See Admitting Provider Note  Report given to:   Additional Notes: n/a

## 2018-06-19 NOTE — ED Triage Notes (Signed)
Had bloodwork drawn today and referred to ED for transfusion.  H/H 7.5/ 25.2.  Patient states she has been having black stools for about 2- months.  Patient AAOx3.  Skin warm and dry.  NAD

## 2018-06-20 DIAGNOSIS — K921 Melena: Secondary | ICD-10-CM

## 2018-06-20 LAB — BASIC METABOLIC PANEL WITH GFR
Anion gap: 7 (ref 5–15)
BUN: 18 mg/dL (ref 8–23)
CO2: 24 mmol/L (ref 22–32)
Calcium: 8.4 mg/dL — ABNORMAL LOW (ref 8.9–10.3)
Chloride: 108 mmol/L (ref 98–111)
Creatinine, Ser: 0.6 mg/dL (ref 0.44–1.00)
GFR calc Af Amer: >60 mL/min
GFR calc non Af Amer: >60 mL/min
Glucose, Bld: 97 mg/dL (ref 70–99)
Potassium: 3.6 mmol/L (ref 3.5–5.1)
Sodium: 139 mmol/L (ref 135–145)

## 2018-06-20 LAB — CBC
HCT: 26.9 % — ABNORMAL LOW (ref 36.0–46.0)
Hemoglobin: 8.2 g/dL — ABNORMAL LOW (ref 12.0–15.0)
MCH: 22.9 pg — ABNORMAL LOW (ref 26.0–34.0)
MCHC: 30.5 g/dL (ref 30.0–36.0)
MCV: 75.1 fL — ABNORMAL LOW (ref 80.0–100.0)
Platelets: 342 10*3/uL (ref 150–400)
RBC: 3.58 MIL/uL — ABNORMAL LOW (ref 3.87–5.11)
RDW: 17.2 % — ABNORMAL HIGH (ref 11.5–15.5)
WBC: 5.8 10*3/uL (ref 4.0–10.5)
nRBC: 0 % (ref 0.0–0.2)

## 2018-06-20 LAB — HEMOGLOBIN: Hemoglobin: 8.6 g/dL — ABNORMAL LOW (ref 12.0–15.0)

## 2018-06-20 MED ORDER — SUCRALFATE 1 G PO TABS
1.0000 g | ORAL_TABLET | Freq: Three times a day (TID) | ORAL | Status: DC
  Administered 2018-06-20: 13:00:00 1 g via ORAL
  Filled 2018-06-20: qty 1

## 2018-06-20 MED ORDER — SODIUM CHLORIDE 0.9 % IV SOLN
510.0000 mg | Freq: Once | INTRAVENOUS | Status: AC
  Administered 2018-06-20: 15:00:00 510 mg via INTRAVENOUS
  Filled 2018-06-20: qty 17

## 2018-06-20 MED ORDER — SUCRALFATE 1 GM/10ML PO SUSP
1.0000 g | Freq: Three times a day (TID) | ORAL | Status: DC
  Administered 2018-06-20 (×2): 1 g via ORAL
  Filled 2018-06-20 (×2): qty 10

## 2018-06-20 NOTE — Plan of Care (Signed)
  Problem: Education: Goal: Ability to identify signs and symptoms of gastrointestinal bleeding will improve Outcome: Progressing   Problem: Bowel/Gastric: Goal: Will show no signs and symptoms of gastrointestinal bleeding Outcome: Progressing   Problem: Fluid Volume: Goal: Will show no signs and symptoms of excessive bleeding Outcome: Progressing   Problem: Clinical Measurements: Goal: Complications related to the disease process, condition or treatment will be avoided or minimized Outcome: Progressing   Problem: Health Behavior/Discharge Planning: Goal: Ability to manage health-related needs will improve Outcome: Progressing   Problem: Clinical Measurements: Goal: Ability to maintain clinical measurements within normal limits will improve Outcome: Progressing Goal: Will remain free from infection Outcome: Progressing Goal: Respiratory complications will improve Outcome: Progressing   Problem: Activity: Goal: Risk for activity intolerance will decrease Outcome: Progressing   Problem: Nutrition: Goal: Adequate nutrition will be maintained Outcome: Progressing   Problem: Coping: Goal: Level of anxiety will decrease Outcome: Progressing   Problem: Elimination: Goal: Will not experience complications related to bowel motility Outcome: Progressing Goal: Will not experience complications related to urinary retention Outcome: Progressing   Problem: Pain Managment: Goal: General experience of comfort will improve Outcome: Progressing   Problem: Safety: Goal: Ability to remain free from injury will improve Outcome: Progressing   Problem: Skin Integrity: Goal: Risk for impaired skin integrity will decrease Outcome: Progressing

## 2018-06-20 NOTE — Consult Note (Signed)
[name] [name] , MD [address] [place], [place], [place], [address] [address], [place], [place], [place], [address] Phone: [phone]  Fax: [phone]  Consultation  Referring Provider:  Dr Darvin Neighbours Primary Care Physician:  Kirk Ruths, MD Primary Gastroenterologist:  Dr. Vicente Males         Reason for Consultation:     GI bleed   Date of Admission:  06/19/2018 Date of Consultation:  06/20/2018         HPI:   [name] [name] is a 85 y.o. female is known to myself since 2018.  I have followed her for iron deficiency anemia, melena.  Back in 2018 a colonoscopy was negative but a capsule study showed proximal jejunal AVMs.  I ablated them with a push enteroscopy.  She is followed by hematology and receives IV iron from them.  She has had a few admissions for anemia.  She underwent a double-balloon enteroscopy in May June 2018 revealing 1 bleeding angiectasia in the jejunum which was ablated.  Repeat a capsule study in June 2019.  Found some coffee-ground material in the stomach and some AVMs in the mid small bowel.  A bleeding AVM in the stomach was noted which was treated. She was referred back to [facility] to evaluate for the AVM seen in the mid small bowel on the capsule study.  Enteroscopy was held off as her hemoglobin was stable.  She presented to the hospital yesterday with complaint of melena.  Hemoglobin is down to 7.3 g from a baseline of 11.75 months back.  MCV also has dropped from 87.7-75.1.  Iron studies revealed an iron level of 13.  In fact her iron levels were normal in December 2019 at 2.  She says her stools have been black on and off for the past few days some lower abdominal discomfort at times. Today had brown stools. Denies any NSAID use.  Past Medical History:  Diagnosis Date  . Anemia   . COPD (chronic obstructive pulmonary disease) ([place])   . Diverticulitis   . GERD (gastroesophageal reflux disease)   . Glaucoma   . HLD (hyperlipidemia)   .  Hypertension   . Iron deficiency   . Rectal bleeding     Past Surgical History:  Procedure Laterality Date  . ABDOMINAL HYSTERECTOMY    . APPENDECTOMY    . COLONOSCOPY WITH PROPOFOL N/A 04/14/2016   Procedure: COLONOSCOPY WITH PROPOFOL;  Surgeon: Lucilla Lame, MD;  Location: [facility] ENDOSCOPY;  Service: Endoscopy;  Laterality: N/A;  . ENTEROSCOPY N/A 05/10/2016   Procedure: Push ENTEROSCOPY with pediatric colonoscope;  Surgeon: [name] Bellows, MD;  Location: [facility] ENDOSCOPY;  Service: Endoscopy;  Laterality: N/A;  . ENTEROSCOPY N/A 05/30/2016   Procedure: push ENTEROSCOPY;  Surgeon: [name] Bellows, MD;  Location: [facility] ENDOSCOPY;  Service: Endoscopy;  Laterality: N/A;  . ENTEROSCOPY N/A 07/05/2017   Procedure: ENTEROSCOPY;  Surgeon: [name] Bellows, MD;  Location: [facility] ENDOSCOPY;  Service: Gastroenterology;  Laterality: N/A;  . ESOPHAGOGASTRODUODENOSCOPY (EGD) WITH PROPOFOL N/A 08/12/2015   Procedure: ESOPHAGOGASTRODUODENOSCOPY (EGD) WITH PROPOFOL;  Surgeon: Lollie Sails, MD;  Location: [facility] ENDOSCOPY;  Service: Endoscopy;  Laterality: N/A;  . ESOPHAGOGASTRODUODENOSCOPY (EGD) WITH PROPOFOL N/A 04/03/2016   Procedure: ESOPHAGOGASTRODUODENOSCOPY (EGD) WITH PROPOFOL;  Surgeon: Lucilla Lame, MD;  Location: [facility] ENDOSCOPY;  Service: Endoscopy;  Laterality: N/A;  . GIVENS CAPSULE STUDY N/A 04/28/2016   Procedure: GIVENS CAPSULE STUDY;  Surgeon: [name] Bellows, MD;  Location: [facility] ENDOSCOPY;  Service: Endoscopy;  Laterality: N/A;  .  GIVENS CAPSULE STUDY N/A 06/21/2017   Procedure: GIVENS CAPSULE STUDY;  Surgeon: [name] Bellows, MD;  Location: [facility] ENDOSCOPY;  Service: Gastroenterology;  Laterality: N/A;  . JOINT REPLACEMENT      Prior to Admission medications   Medication Sig Start Date End Date Taking? Authorizing Provider  acetaminophen (TYLENOL) 650 MG CR tablet Take 650 mg by mouth every 8 (eight) hours as needed for pain.    [provider]  albuterol (PROVENTIL HFA;VENTOLIN HFA) 108 (90 Base) MCG/ACT inhaler  Inhale 2 puffs into the lungs every 4 (four) hours as needed for wheezing or shortness of breath.    [provider]  chlorthalidone (HYGROTON) 25 MG tablet Take 12.5 mg by mouth daily.  01/18/16   [provider]  Coenzyme Q10 10 MG capsule Take 10 mg by mouth daily.     [provider]  COENZYME Q10 PO Take 1 tablet by mouth daily.    [provider]  colchicine 0.6 MG tablet Take 0.6 mg by mouth daily.  08/28/13   [provider]  Cyanocobalamin (B-12) 1000 MCG CAPS Take 1,000 Units by mouth 2 (two) times daily.     [provider]  cycloSPORINE (RESTASIS) 0.05 % ophthalmic emulsion Place 1 drop into both eyes 2 (two) times daily.    [provider]  fluticasone (FLONASE) 50 MCG/ACT nasal spray Place 1 spray into both nostrils daily as needed.     [provider]  gabapentin (NEURONTIN) 300 MG capsule Take 300 mg by mouth 2 (two) times daily.    [provider]  guaiFENesin (MUCINEX) 600 MG 12 hr tablet Take 600 mg by mouth 2 (two) times daily as needed.    [provider]  hydrocortisone (PROCTOSOL HC) 2.5 % rectal cream PLACE RECTALLY TWICE A DAY 10/13/15   [provider]  loratadine (CLARITIN) 10 MG tablet Take 10 mg by mouth daily as needed for allergies.    [provider]  losartan (COZAAR) 50 MG tablet Take 50 mg by mouth daily.    [provider]  menthol-zinc oxide (GOLD BOND) powder Apply topically once daily as needed (PRN under breasts for yeast).    [provider]  mometasone (ELOCON) 0.1 % lotion Apply topically.    [provider]  Multiple Vitamins-Minerals (OCUVITE PRESERVISION PO) Take 1 tablet by mouth 2 (two) times daily.    [provider]  Multiple Vitamins-Minerals (OCUVITE PRESERVISION PO) Take by mouth.    [provider]  PARoxetine (PAXIL) 40 MG tablet Take 40 mg by mouth every morning.    [provider]   Polyethyl Glycol-Propyl Glycol (SYSTANE) 0.4-0.3 % SOLN Apply 1 drop to eye as needed.    [provider]  potassium chloride (K-DUR) 10 MEQ tablet Take 10 mEq by mouth daily.  04/30/17   [provider]  potassium chloride (K-DUR,KLOR-CON) 10 MEQ tablet Take 10 mEq by mouth 2 (two) times daily.     [provider]  PROPYLENE GLYCOL-GLYCERIN OP Apply to eye.    [provider]  RABEprazole (ACIPHEX) 20 MG tablet Take 20 mg by mouth 2 (two) times daily.    [provider]  Spacer/Aero-Holding Chambers (E-Z SPACER) inhaler Use as instructed. 11/05/15   [provider]  sucralfate (CARAFATE) 1 g tablet Take 1 tablet (1 g total) by mouth 4 (four) times daily -  with meals and at bedtime. 08/13/15   Dustin Flock, MD  Tetrahydroz-Dextran-PEG-Povid (EYE DROPS ADVANCED RELIEF OP)  Apply to eye.    [provider]  timolol (TIMOPTIC-XR) 0.5 % ophthalmic gel-forming Place 1 drop into both eyes daily.    [provider]  valACYclovir (VALTREX) 1000 MG tablet once daily as needed. Reported on 03/22/2015 01/18/13   [provider]    Family History  Problem Relation Age of Onset  . CAD Mother   . CAD Father   . CAD Brother      Social History   Tobacco Use  . Smoking status: Former Smoker    Types: Cigarettes  . Smokeless tobacco: Never Used  Substance Use Topics  . Alcohol use: No  . Drug use: No    Allergies as of 06/19/2018 - Review Complete 06/19/2018  Allergen Reaction Noted  . Nsaids Hives 08/10/2015  . Codeine Nausea And Vomiting 08/10/2015  . Dextrans Hives 08/10/2015  . Fentanyl Itching 08/10/2015  . Lipitor [atorvastatin] Other (See Comments) 08/10/2015  . Lovastatin    . Mobic [meloxicam] Other (See Comments) 08/10/2015  . Niacin and related Itching 08/10/2015  . Other    . Polysaccharide k Hives 08/10/2015  . Pravachol [pravastatin sodium] Other (See Comments) 08/10/2015  . Statins Other (See  Comments) 03/31/2016  . Tolmetin Hives 08/10/2015  . Voltaren [diclofenac sodium] Other (See Comments) 08/10/2015  . Zetia [ezetimibe] Other (See Comments) 08/10/2015  . Niacin Itching 08/10/2015    Review of Systems:    All systems reviewed and negative except where noted in HPI.   Physical Exam:  Vital signs in last 24 hours: Temp:  [97.8 F (36.6 C)-98.4 F (36.9 C)] 97.9 F (36.6 C) (06/11 0346) Pulse Rate:  [71-82] 79 (06/11 0346) Resp:  [16-20] 17 (06/11 0346) BP: (131-180)/(55-88) 171/76 (06/11 0346) SpO2:  [95 %-98 %] 95 % (06/11 0346) Weight:  [85.3 kg] 85.3 kg (06/10 1734) Last BM Date: 06/19/18 General:   Pleasant, cooperative in NAD Head:  Normocephalic and atraumatic. Eyes:   No icterus.   Conjunctiva pink. PERRLA. Ears:  Normal auditory acuity. Neck:  Supple; no masses or thyroidomegaly Lungs: Respirations even and unlabored. Lungs clear to auscultation bilaterally.   No wheezes, crackles, or rhonchi.  Heart:  Regular rate and rhythm;  Without murmur, clicks, rubs or gallops Abdomen:  Soft, nondistended, nontender. Normal bowel sounds. No appreciable masses or hepatomegaly.  No rebound or guarding.  Neurologic:  Alert and oriented x3;  grossly normal neurologically. Skin:  Intact without significant lesions or rashes. Cervical Nodes:  No significant cervical adenopathy. Psych:  Alert and cooperative. Normal affect.  LAB RESULTS: Recent Labs    06/19/18 1744 06/20/18 0432  WBC 7.1 5.8  HGB 7.3* 8.2*  HCT 24.2* 26.9*  PLT 387 342   BMET Recent Labs    06/19/18 1744 06/20/18 0432  NA 137 139  K 4.1 3.6  CL 105 108  CO2 24 24  GLUCOSE 95 97  BUN 19 18  CREATININE 0.56 0.60  CALCIUM 8.8* 8.4*   LFT Recent Labs    06/19/18 1744  PROT 7.3  ALBUMIN 3.8  AST 24  ALT 15  ALKPHOS 71  BILITOT 0.5   PT/INR No results for input(s): LABPROT, INR in the last 72 hours.  STUDIES: No results found.    Impression / Plan:   Nasiya Pascual  is a 85 y.o. y/o female with a history of multiple upper endoscopies small bowel enteroscopy's for bleeding AVMs.  She follows with hematology for iron infusions.  She was doing well till her  last blood draw up a few months back with stable hemoglobin and iron studies.  She is presently admitted with melena and noted to have worsening of her iron deficiency likely from bleeding from underlying AVMs.  Plan 1.  Suggest IV iron as inpatient and continue as an outpatient with Dr. Janese Banks 2.  Push enteroscopy tomorrow. 3.  IV PPI and sucralfate. 4.  If push enteroscopy is negative then she will need a capsule study of the small bowel. 5.  Discontinue any stool occult testing she has obviously had a bleed and a positive or negative stool occult test does not rule in or rule out a GI bleed.  I have discussed alternative options, risks & benefits,  which include, but are not limited to, bleeding, infection, perforation,respiratory complication & drug reaction.  The patient agrees with this plan & written consent will be obtained.      Thank you for involving me in the care of this patient.      LOS: 1 day   [name] Bellows, MD  06/20/2018, 9:22 AM

## 2018-06-20 NOTE — Progress Notes (Signed)
[facility] [place] NAME: Narelle Schoening    MR#:  [id number]  DATE OF BIRTH:  Aug 29, 1925  SUBJECTIVE:  CHIEF COMPLAINT:   Chief Complaint  Patient presents with  . transfusion   2 BMs today and no blood. Feels weak and epigastric pain  REVIEW OF SYSTEMS:    Review of Systems  Constitutional: Positive for malaise/fatigue. Negative for chills and fever.  HENT: Negative for sore throat.   Eyes: Negative for blurred vision, double vision and pain.  Respiratory: Negative for cough, hemoptysis, shortness of breath and wheezing.   Cardiovascular: Negative for chest pain, palpitations, orthopnea and leg swelling.  Gastrointestinal: Positive for abdominal pain. Negative for constipation, diarrhea, heartburn, nausea and vomiting.  Genitourinary: Negative for dysuria and hematuria.  Musculoskeletal: Negative for back pain and joint pain.  Skin: Negative for rash.  Neurological: Negative for sensory change, speech change, focal weakness and headaches.  Endo/Heme/Allergies: Does not bruise/bleed easily.  Psychiatric/Behavioral: Negative for depression. The patient is not nervous/anxious.     DRUG ALLERGIES:   Allergies  Allergen Reactions  . Nsaids Hives  . Codeine Nausea And Vomiting  . Dextrans Hives  . Fentanyl Itching  . Lipitor [Atorvastatin] Other (See Comments)    Reaction: Muscle pain  . Lovastatin   . Mobic [Meloxicam] Other (See Comments)    Reaction: Mouth and tongue ulcers  . Niacin And Related Itching  . Other   . Polysaccharide K Hives  . Pravachol [Pravastatin Sodium] Other (See Comments)    Reaction: Muscle pain  . Statins Other (See Comments)  . Tolmetin Hives  . Voltaren [Diclofenac Sodium] Other (See Comments)    Reaction: Mouth and tongue ulcers  . Zetia [Ezetimibe] Other (See Comments)    Reaction: Leg pain  . Niacin Itching    VITALS:  Blood pressure (!) 171/76, pulse 79, temperature 97.9 F (36.6 C),  resp. rate 17, height 5\' 8"  (1.727 m), weight 85.3 kg, SpO2 95 %.  PHYSICAL EXAMINATION:   Physical Exam  GENERAL:  85 y.o.-year-old patient lying in the bed with no acute distress.  EYES: Pupils equal, round, reactive to light and accommodation. No scleral icterus. Extraocular muscles intact.  HEENT: Head atraumatic, normocephalic. Oropharynx and nasopharynx clear.  NECK:  Supple, no jugular venous distention. No thyroid enlargement, no tenderness.  LUNGS: Normal breath sounds bilaterally, no wheezing, rales, rhonchi. No use of accessory muscles of respiration.  CARDIOVASCULAR: S1, S2 normal. No murmurs, rubs, or gallops.  ABDOMEN: Soft, nontender, nondistended. Bowel sounds present. No organomegaly or mass.  EXTREMITIES: No cyanosis, clubbing or edema b/l.    NEUROLOGIC: Cranial nerves II through XII are intact. No focal Motor or sensory deficits b/l.   PSYCHIATRIC: The patient is alert and oriented x 3.  SKIN: No obvious rash, lesion, or ulcer.   LABORATORY PANEL:   CBC Recent Labs  Lab 06/20/18 0432  WBC 5.8  HGB 8.2*  HCT 26.9*  PLT 342   ------------------------------------------------------------------------------------------------------------------ Chemistries  Recent Labs  Lab 06/19/18 1744 06/20/18 0432  NA 137 139  K 4.1 3.6  CL 105 108  CO2 24 24  GLUCOSE 95 97  BUN 19 18  CREATININE 0.56 0.60  CALCIUM 8.8* 8.4*  AST 24  --   ALT 15  --   ALKPHOS 71  --   BILITOT 0.5  --    ------------------------------------------------------------------------------------------------------------------  Cardiac Enzymes No results for input(s): TROPONINI in the last 168 hours. ------------------------------------------------------------------------------------------------------------------  RADIOLOGY:  No results found.   ASSESSMENT AND PLAN:   *Melena with acute blood loss anemia Status post 1 unit packed RBC transfusion.  Hemoglobin improved.  Still low. IV  iron dose ordered. Enteroscopy scheduled for tomorrow.  Will start clear liquids.  On PPI and Carafate. Repeat hemoglobin later in the evening and in the morning.     HTN (hypertension), benign -continue home meds    Chronic obstructive pulmonary disease ([place]) -continue home medications    HLD (hyperlipidemia) -home dose antilipid    GERD (gastroesophageal reflux disease) -PPI as above  COPD- stable  All the records are reviewed and case discussed with Care Management/Social Worker. Management plans discussed with the patient, family and they are in agreement.  CODE STATUS: DNR/DNI  DVT Prophylaxis: SCDs  TOTAL TIME TAKING CARE OF THIS PATIENT: 35 minutes.   POSSIBLE D/C IN 1-2 DAYS, DEPENDING ON CLINICAL CONDITION.  Neita Carp M.D on 06/20/2018 at 12:38 PM  Between 7am to 6pm - Pager - [phone]  After 6pm go to [email or web address] - password [email or web address] [facility]  Office  [phone]  CC: Primary care physician; Kirk Ruths, MD  Note: This dictation was prepared with Dragon dictation along with smaller phrase technology. Any transcriptional errors that result from this process are unintentional.

## 2018-06-21 ENCOUNTER — Inpatient Hospital Stay: Payer: Medicare Other | Admitting: Anesthesiology

## 2018-06-21 ENCOUNTER — Encounter
Admission: EM | Disposition: A | Discharge status: Home / Self Care | Payer: Self-pay | Source: Home / Self Care | Attending: Internal Medicine

## 2018-06-21 HISTORY — PX: ENTEROSCOPY: SHX5533

## 2018-06-21 LAB — HEMOGLOBIN: Hemoglobin: 9.6 g/dL — ABNORMAL LOW (ref 12.0–15.0)

## 2018-06-21 SURGERY — ENTEROSCOPY
Anesthesia: General

## 2018-06-21 MED ORDER — BUDESONIDE 0.25 MG/2ML IN SUSP: 0.2500 mg | Freq: Two times a day (BID) | RESPIRATORY_TRACT | Status: DC

## 2018-06-21 MED ORDER — TIZANIDINE HCL 2 MG PO TABS
2.0000 mg | ORAL_TABLET | Freq: Two times a day (BID) | ORAL | Status: DC | PRN
  Filled 2018-06-21: qty 2

## 2018-06-21 MED ORDER — CYCLOSPORINE 0.05 % OP EMUL
1.0000 [drp] | Freq: Two times a day (BID) | OPHTHALMIC | Status: DC
  Administered 2018-06-21: 1 [drp] via OPHTHALMIC
  Filled 2018-06-21 (×2): qty 30

## 2018-06-21 MED ORDER — PROPOFOL 500 MG/50ML IV EMUL
INTRAVENOUS | Status: DC | PRN
  Administered 2018-06-21: 100 ug/kg/min via INTRAVENOUS

## 2018-06-21 MED ORDER — GABAPENTIN 300 MG PO CAPS
300.0000 mg | ORAL_CAPSULE | Freq: Two times a day (BID) | ORAL | Status: DC
  Administered 2018-06-21: 300 mg via ORAL
  Filled 2018-06-21: qty 1

## 2018-06-21 MED ORDER — LOSARTAN POTASSIUM 50 MG PO TABS
50.0000 mg | ORAL_TABLET | Freq: Every day | ORAL | Status: DC
  Administered 2018-06-21: 50 mg via ORAL
  Filled 2018-06-21: qty 1

## 2018-06-21 MED ORDER — LIDOCAINE HCL (PF) 2 % IJ SOLN
INTRAMUSCULAR | Status: DC | PRN
  Administered 2018-06-21: 80 mg via INTRADERMAL

## 2018-06-21 MED ORDER — CHLORTHALIDONE 25 MG PO TABS
12.5000 mg | ORAL_TABLET | Freq: Every day | ORAL | Status: DC
  Administered 2018-06-21: 12.5 mg via ORAL
  Filled 2018-06-21: qty 0.5

## 2018-06-21 MED ORDER — TIMOLOL MALEATE 0.5 % OP SOLG: 1.0000 [drp] | Freq: Every day | OPHTHALMIC | Status: DC

## 2018-06-21 MED ORDER — SODIUM CHLORIDE 0.9 % IV SOLN
INTRAVENOUS | Status: DC
  Administered 2018-06-21: 09:00:00 via INTRAVENOUS

## 2018-06-21 MED ORDER — HYDRALAZINE HCL 20 MG/ML IJ SOLN
10.0000 mg | Freq: Four times a day (QID) | INTRAMUSCULAR | Status: DC | PRN
  Administered 2018-06-21: 10 mg via INTRAVENOUS
  Administered 2018-06-21: 10:00:00 5 mg via INTRAVENOUS
  Filled 2018-06-21: qty 1

## 2018-06-21 MED ORDER — PROPOFOL 10 MG/ML IV BOLUS
INTRAVENOUS | Status: DC | PRN
  Administered 2018-06-21: 20 mg via INTRAVENOUS
  Administered 2018-06-21: 40 mg via INTRAVENOUS
  Administered 2018-06-21: 20 mg via INTRAVENOUS

## 2018-06-21 MED ORDER — ESCITALOPRAM OXALATE 10 MG PO TABS
20.0000 mg | ORAL_TABLET | Freq: Every day | ORAL | Status: DC
  Administered 2018-06-21: 20 mg via ORAL
  Filled 2018-06-21: qty 2

## 2018-06-21 NOTE — H&P (Signed)
[name], MD [address], [place], [place], [place], [address] [address], [place], [place], [place], [address] Phone: [phone]  Fax: [phone]  Primary Care Physician:  Kirk Ruths, MD   Pre-Procedure History & Physical: HPI:  [name] [name] is a 85 y.o. female is here for an endoscopy    Past Medical History:  Diagnosis Date  . Anemia   . COPD (chronic obstructive pulmonary disease) ([place])   . Diverticulitis   . GERD (gastroesophageal reflux disease)   . Glaucoma   . HLD (hyperlipidemia)   . Hypertension   . Iron deficiency   . Rectal bleeding     Past Surgical History:  Procedure Laterality Date  . ABDOMINAL HYSTERECTOMY    . APPENDECTOMY    . COLONOSCOPY WITH PROPOFOL N/A 04/14/2016   Procedure: COLONOSCOPY WITH PROPOFOL;  Surgeon: Lucilla Lame, MD;  Location: [facility] ENDOSCOPY;  Service: Endoscopy;  Laterality: N/A;  . ENTEROSCOPY N/A 05/10/2016   Procedure: Push ENTEROSCOPY with pediatric colonoscope;  Surgeon: [name], MD;  Location: [facility] ENDOSCOPY;  Service: Endoscopy;  Laterality: N/A;  . ENTEROSCOPY N/A 05/30/2016   Procedure: push ENTEROSCOPY;  Surgeon: [name], MD;  Location: [facility] ENDOSCOPY;  Service: Endoscopy;  Laterality: N/A;  . ENTEROSCOPY N/A 07/05/2017   Procedure: ENTEROSCOPY;  Surgeon: [name], MD;  Location: [facility] ENDOSCOPY;  Service: Gastroenterology;  Laterality: N/A;  . ESOPHAGOGASTRODUODENOSCOPY (EGD) WITH PROPOFOL N/A 08/12/2015   Procedure: ESOPHAGOGASTRODUODENOSCOPY (EGD) WITH PROPOFOL;  Surgeon: Lollie Sails, MD;  Location: [facility] ENDOSCOPY;  Service: Endoscopy;  Laterality: N/A;  . ESOPHAGOGASTRODUODENOSCOPY (EGD) WITH PROPOFOL N/A 04/03/2016   Procedure: ESOPHAGOGASTRODUODENOSCOPY (EGD) WITH PROPOFOL;  Surgeon: Lucilla Lame, MD;  Location: [facility] ENDOSCOPY;  Service: Endoscopy;  Laterality: N/A;  . GIVENS CAPSULE STUDY N/A 04/28/2016   Procedure: GIVENS CAPSULE STUDY;  Surgeon: [name], MD;  Location: [facility]  ENDOSCOPY;  Service: Endoscopy;  Laterality: N/A;  . GIVENS CAPSULE STUDY N/A 06/21/2017   Procedure: GIVENS CAPSULE STUDY;  Surgeon: [name], MD;  Location: [facility] ENDOSCOPY;  Service: Gastroenterology;  Laterality: N/A;  . JOINT REPLACEMENT      Prior to Admission medications   Medication Sig Start Date End Date Taking? Authorizing Provider  acetaminophen (TYLENOL) 500 MG tablet Take 500-1,000 mg by mouth every 6 (six) hours as needed for mild pain, moderate pain or fever.   Yes [provider]  albuterol (PROVENTIL HFA;VENTOLIN HFA) 108 (90 Base) MCG/ACT inhaler Inhale 2 puffs into the lungs every 4 (four) hours as needed for wheezing or shortness of breath.   Yes [provider]  chlorthalidone (HYGROTON) 25 MG tablet Take 12.5 mg by mouth daily.  01/18/16  Yes [provider]  Coenzyme Q10 10 MG capsule Take 10 mg by mouth daily.    Yes [provider]  Cyanocobalamin (B-12) 1000 MCG CAPS Take 2,000 Units by mouth daily.    Yes [provider]  cycloSPORINE (RESTASIS) 0.05 % ophthalmic emulsion Place 1 drop into both eyes 2 (two) times daily.   Yes [provider]  escitalopram (LEXAPRO) 20 MG tablet Take 20 mg by mouth daily.   Yes [provider]  fluticasone (FLOVENT HFA) 110 MCG/ACT inhaler Inhale 2 puffs into the lungs 2 (two) times daily.   Yes [provider]  gabapentin (NEURONTIN) 300 MG capsule Take 300 mg by mouth 2 (two) times daily.   Yes [provider]  guaiFENesin (MUCINEX) 600 MG 12 hr tablet Take 600 mg by  mouth 2 (two) times daily as needed for cough or to loosen phlegm.    Yes [provider]  losartan (COZAAR) 50 MG tablet Take 50 mg by mouth daily.   Yes [provider]  mometasone (ELOCON) 0.1 % lotion Apply 1 application topically daily as needed (skin irritation).    Yes [provider]  Multiple Vitamins-Minerals (OCUVITE PRESERVISION PO) Take 1 tablet by mouth  2 (two) times daily.   Yes [provider]  potassium chloride (K-DUR,KLOR-CON) 10 MEQ tablet Take 10 mEq by mouth 3 (three) times daily.    Yes [provider]  RABEprazole (ACIPHEX) 20 MG tablet Take 20 mg by mouth 2 (two) times daily.   Yes [provider]  sucralfate (CARAFATE) 1 g tablet Take 1 tablet (1 g total) by mouth 4 (four) times daily -  with meals and at bedtime. 08/13/15  Yes Dustin Flock, MD  timolol (TIMOPTIC-XR) 0.5 % ophthalmic gel-forming Place 1 drop into both eyes daily.   Yes [provider]  tiZANidine (ZANAFLEX) 4 MG tablet Take 2-4 mg by mouth 2 (two) times daily as needed for muscle spasms.   Yes [provider]  valACYclovir (VALTREX) 1000 MG tablet Take 1,000 mg by mouth daily as needed (breakouts).    Yes [provider]    Allergies as of 06/19/2018 - Review Complete 06/19/2018  Allergen Reaction Noted  . Nsaids Hives 08/10/2015  . Codeine Nausea And Vomiting 08/10/2015  . Dextrans Hives 08/10/2015  . Fentanyl Itching 08/10/2015  . Lipitor [atorvastatin] Other (See Comments) 08/10/2015  . Lovastatin    . Mobic [meloxicam] Other (See Comments) 08/10/2015  . Niacin and related Itching 08/10/2015  . Other    . Polysaccharide k Hives 08/10/2015  . Pravachol [pravastatin sodium] Other (See Comments) 08/10/2015  . Statins Other (See Comments) 03/31/2016  . Tolmetin Hives 08/10/2015  . Voltaren [diclofenac sodium] Other (See Comments) 08/10/2015  . Zetia [ezetimibe] Other (See Comments) 08/10/2015  . Niacin Itching 08/10/2015    Family History  Problem Relation Age of Onset  . CAD Mother   . CAD Father   . CAD Brother     Social History   Socioeconomic History  . Marital status: Widowed    Spouse name: Not on file  . Number of children: Not on file  . Years of education: Not on file  . Highest education level: Not on file  Occupational History  . Not on file  Social Needs  . Financial  resource strain: Not on file  . Food insecurity    Worry: Not on file    Inability: Not on file  . Transportation needs    Medical: Not on file    Non-medical: Not on file  Tobacco Use  . Smoking status: Former Smoker    Types: Cigarettes  . Smokeless tobacco: Never Used  Substance and Sexual Activity  . Alcohol use: No  . Drug use: No  . Sexual activity: Not on file  Lifestyle  . Physical activity    Days per week: Not on file    Minutes per session: Not on file  . Stress: Not on file  Relationships  . Social Herbalist on phone: Not on file    Gets together: Not on file    Attends religious service: Not on file    Active member of club or organization: Not on file    Attends meetings of clubs or organizations: Not on file  Relationship status: Not on file  . Intimate partner violence    Fear of current or ex partner: Not on file    Emotionally abused: Not on file    Physically abused: Not on file    Forced sexual activity: Not on file  Other Topics Concern  . Not on file  Social History Narrative  . Not on file    Review of Systems: See HPI, otherwise negative ROS  Physical Exam: BP (!) 178/81   Pulse 76   Temp 98.1 F (36.7 C) (Oral)   Resp 16   Ht 5\' 8"  (1.727 m)   Wt 85.3 kg   SpO2 98%   BMI 28.59 kg/m  General:   Alert,  pleasant and cooperative in NAD Head:  Normocephalic and atraumatic. Neck:  Supple; no masses or thyromegaly. Lungs:  Clear throughout to auscultation, normal respiratory effort.    Heart:  +S1, +S2, Regular rate and rhythm, No edema. Abdomen:  Soft, nontender and nondistended. Normal bowel sounds, without guarding, and without rebound.   Neurologic:  Alert and  oriented x4;  grossly normal neurologically.  Impression/Plan: [name] [name] is here for an endoscopy  to be performed for  evaluation of melena, anemia    Risks, benefits, limitations, and alternatives regarding endoscopy have been reviewed with the  patient.  Questions have been answered.  All parties agreeable.   [name], MD  06/21/2018, 9:47 AM

## 2018-06-21 NOTE — Care Management Important Message (Signed)
Important Message  Patient Details  Name: Rozalia Dino MRN: [id number] Date of Birth: 12/17/1925   Medicare Important Message Given:       Marshell Garfinkel, RN 06/21/2018, 10:38 AM

## 2018-06-21 NOTE — Plan of Care (Signed)
  Problem: Education: Goal: Ability to identify signs and symptoms of gastrointestinal bleeding will improve Outcome: Progressing   Problem: Bowel/Gastric: Goal: Will show no signs and symptoms of gastrointestinal bleeding Outcome: Progressing   Problem: Fluid Volume: Goal: Will show no signs and symptoms of excessive bleeding Outcome: Progressing   Problem: Clinical Measurements: Goal: Complications related to the disease process, condition or treatment will be avoided or minimized Outcome: Progressing   Problem: Health Behavior/Discharge Planning: Goal: Ability to manage health-related needs will improve Outcome: Progressing   Problem: Clinical Measurements: Goal: Ability to maintain clinical measurements within normal limits will improve Outcome: Progressing Goal: Will remain free from infection Outcome: Progressing Goal: Respiratory complications will improve Outcome: Progressing   Problem: Activity: Goal: Risk for activity intolerance will decrease Outcome: Progressing   Problem: Nutrition: Goal: Adequate nutrition will be maintained Outcome: Progressing   Problem: Coping: Goal: Level of anxiety will decrease Outcome: Progressing   Problem: Elimination: Goal: Will not experience complications related to bowel motility Outcome: Progressing Goal: Will not experience complications related to urinary retention Outcome: Progressing   Problem: Pain Managment: Goal: General experience of comfort will improve Outcome: Progressing   Problem: Safety: Goal: Ability to remain free from injury will improve Outcome: Progressing   Problem: Skin Integrity: Goal: Risk for impaired skin integrity will decrease Outcome: Progressing

## 2018-06-21 NOTE — Anesthesia Preprocedure Evaluation (Signed)
Anesthesia Evaluation  Patient identified by MRN, date of birth, ID band Patient awake    Reviewed: Allergy & Precautions, NPO status , Patient's Chart, lab work & pertinent test results, reviewed documented beta blocker date and time   History of Anesthesia Complications Negative for: history of anesthetic complications  Airway Mallampati: III  TM Distance: >3 FB     Dental  (+) Chipped   Pulmonary shortness of breath (from anemia), pneumonia, resolved, COPD, Recent URI , Residual Cough, former smoker,           Cardiovascular Exercise Tolerance: Good hypertension, Pt. on medications and Pt. on home beta blockers (-) angina     Neuro/Psych PSYCHIATRIC DISORDERS Depression  Neuromuscular disease    GI/Hepatic Neg liver ROS, PUD, GERD  ,  Endo/Other  negative endocrine ROS  Renal/GU negative Renal ROS     Musculoskeletal  (+) Arthritis , Osteoarthritis,  Fibromyalgia -  Abdominal   Peds  Hematology  (+) Blood dyscrasia, anemia ,   Anesthesia Other Findings Past Medical History: No date: Anemia No date: COPD (chronic obstructive pulmonary disease) ([facility]) No date: Diverticulitis No date: GERD (gastroesophageal reflux disease) No date: Glaucoma No date: HLD (hyperlipidemia) No date: Hypertension No date: Iron deficiency No date: Rectal bleeding   Reproductive/Obstetrics                             Anesthesia Physical  Anesthesia Plan  ASA: III  Anesthesia Plan: General   Post-op Pain Management:    Induction: Intravenous  PONV Risk Score and Plan: 3 and Propofol infusion and TIVA  Airway Management Planned: Nasal Cannula and Natural Airway  Additional Equipment:   Intra-op Plan:   Post-operative Plan:   Informed Consent: I have reviewed the patients History and Physical, chart, labs and discussed the procedure including the risks, benefits and alternatives for the  proposed anesthesia with the patient or authorized representative who has indicated his/her understanding and acceptance.       Plan Discussed with: CRNA  Anesthesia Plan Comments:         Anesthesia Quick Evaluation

## 2018-06-21 NOTE — Transfer of Care (Signed)
Immediate Anesthesia Transfer of Care Note  Patient: [name] [name]  Procedure(s) Performed: ENTEROSCOPY (N/A )  Patient Location: PACU  Anesthesia Type:General  Level of Consciousness: awake and sedated  Airway & Oxygen Therapy: Patient Spontanous Breathing and Patient connected to nasal cannula oxygen  Post-op Assessment: Report given to RN and Post -op Vital signs reviewed and stable  Post vital signs: Reviewed and stable  Last Vitals:  Vitals Value Taken Time  BP 163/75 06/21/18 1017  Temp    Pulse 87 06/21/18 1018  Resp 17 06/21/18 1018  SpO2 98 % 06/21/18 1018  Vitals shown include unvalidated device data.  Last Pain:  Vitals:   06/21/18 0924  TempSrc: Oral  PainSc: 0-No pain         Complications: No apparent anesthesia complications

## 2018-06-21 NOTE — Op Note (Signed)
[facility] Gastroenterology Patient Name: [name] [name] Procedure Date: 06/21/2018 9:46 AM MRN: [id number] Account #: [id number] Date of Birth: 1925-03-28 Admit Type: Inpatient Age: 85 Room: [facility] ENDO ROOM 3 Gender: Female Note Status: Finalized Procedure:            Small bowel enteroscopy Indications:          Melena Providers:            [name] MD, MD Medicines:            Monitored Anesthesia Care Complications:        No immediate complications. Procedure:            Pre-Anesthesia Assessment:                       - Prior to the procedure, a History and Physical was                        performed, and patient medications, allergies and                        sensitivities were reviewed. The patient's tolerance of                        previous anesthesia was reviewed.                       - The risks and benefits of the procedure and the                        sedation options and risks were discussed with the                        patient. All questions were answered and informed                        consent was obtained.                       - ASA Grade Assessment: III - A patient with severe                        systemic disease.                       After obtaining informed consent, the endoscope was                        passed under direct vision. Throughout the procedure,                        the patient's blood pressure, pulse, and oxygen                        saturations were monitored continuously. The                        Colonoscope was introduced through the mouth and                        advanced to the mid-jejunum. The small bowel  enteroscopy was accomplished with ease. The patient                        tolerated the procedure well. Findings:      The stomach was normal.      The esophagus was normal.      The examined duodenum was normal.      Two angioectasias with no bleeding were found  in the proximal jejunum       and in the mid-jejunum. Coagulation for hemostasis using argon plasma at       0.5 liters/minute and 20 watts was successful. Endoscope inserted to the       hilt 160 cm . no active bleeding seen anywehre good visualization Impression:           - Normal stomach.                       - Normal esophagus.                       - Normal examined duodenum.                       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                        Treated with argon plasma coagulation (APC).                       - No specimens collected. Recommendation:       - Return patient to hospital ward for ongoing care.                       - Advance diet as tolerated.                       - Continue present medications.                       - If Hb stable home as she has not had abowel movement                        in the hospital                       IV iron before discharge if not already had any                       Outpatient follow up with me to determine if small                        bowel capsule repeat needed                       F/u with hematology for IV iron Procedure Code(s):    --- Professional ---                       [phone], Small intestinal endoscopy, enteroscopy beyond                        second portion of duodenum, not including ileum; with  control of bleeding (eg, injection, bipolar cautery,                        unipolar cautery, laser, heater probe, stapler, plasma                        coagulator) CPT copyright 2019 American Medical Association. All rights reserved. The codes documented in this report are preliminary and upon coder review may  be revised to meet current compliance requirements. [name], MD [name] MD, MD 06/21/2018 10:16:40 AM This report has been signed electronically. Number of Addenda: 0 Note Initiated On: 06/21/2018 9:46 AM Estimated Blood Loss: Estimated blood loss: none.       [facility]

## 2018-06-21 NOTE — Plan of Care (Signed)
  Problem: Education: Goal: Ability to identify signs and symptoms of gastrointestinal bleeding will improve 06/21/2018 1429 by Rance Muir, RN Outcome: Adequate for Discharge 06/21/2018 0917 by Rance Muir, RN Outcome: Progressing   Problem: Bowel/Gastric: Goal: Will show no signs and symptoms of gastrointestinal bleeding 06/21/2018 1429 by Rance Muir, RN Outcome: Adequate for Discharge 06/21/2018 0917 by Rance Muir, RN Outcome: Progressing   Problem: Fluid Volume: Goal: Will show no signs and symptoms of excessive bleeding 06/21/2018 1429 by Rance Muir, RN Outcome: Adequate for Discharge 06/21/2018 0917 by Rance Muir, RN Outcome: Progressing   Problem: Clinical Measurements: Goal: Complications related to the disease process, condition or treatment will be avoided or minimized 06/21/2018 1429 by Rance Muir, RN Outcome: Adequate for Discharge 06/21/2018 0917 by Rance Muir, RN Outcome: Progressing   Problem: Health Behavior/Discharge Planning: Goal: Ability to manage health-related needs will improve 06/21/2018 1429 by Rance Muir, RN Outcome: Adequate for Discharge 06/21/2018 0917 by Rance Muir, RN Outcome: Progressing   Problem: Clinical Measurements: Goal: Ability to maintain clinical measurements within normal limits will improve 06/21/2018 1429 by Rance Muir, RN Outcome: Adequate for Discharge 06/21/2018 0917 by Rance Muir, RN Outcome: Progressing Goal: Will remain free from infection 06/21/2018 1429 by Rance Muir, RN Outcome: Adequate for Discharge 06/21/2018 0917 by Rance Muir, RN Outcome: Progressing Goal: Respiratory complications will improve 06/21/2018 1429 by Rance Muir, RN Outcome: Adequate for Discharge 06/21/2018 0917 by Rance Muir, RN Outcome: Progressing   Problem: Activity: Goal: Risk for activity intolerance will decrease 06/21/2018 1429 by Rance Muir, RN Outcome: Adequate for Discharge 06/21/2018 0917 by Rance Muir, RN Outcome: Progressing   Problem: Nutrition: Goal: Adequate  nutrition will be maintained 06/21/2018 1429 by Rance Muir, RN Outcome: Adequate for Discharge 06/21/2018 0917 by Rance Muir, RN Outcome: Progressing   Problem: Coping: Goal: Level of anxiety will decrease 06/21/2018 1429 by Rance Muir, RN Outcome: Adequate for Discharge 06/21/2018 0917 by Rance Muir, RN Outcome: Progressing   Problem: Elimination: Goal: Will not experience complications related to bowel motility 06/21/2018 1429 by Rance Muir, RN Outcome: Adequate for Discharge 06/21/2018 [place] by Rance Muir, RN Outcome: Progressing Goal: Will not experience complications related to urinary retention 06/21/2018 1429 by Rance Muir, RN Outcome: Adequate for Discharge 06/21/2018 0917 by Rance Muir, RN Outcome: Progressing   Problem: Pain Managment: Goal: General experience of comfort will improve 06/21/2018 1429 by Rance Muir, RN Outcome: Adequate for Discharge 06/21/2018 0917 by Rance Muir, RN Outcome: Progressing   Problem: Safety: Goal: Ability to remain free from injury will improve 06/21/2018 1429 by Rance Muir, RN Outcome: Adequate for Discharge 06/21/2018 0917 by Rance Muir, RN Outcome: Progressing   Problem: Skin Integrity: Goal: Risk for impaired skin integrity will decrease 06/21/2018 1429 by Rance Muir, RN Outcome: Adequate for Discharge 06/21/2018 [place] by Rance Muir, RN Outcome: Progressing

## 2018-06-21 NOTE — Anesthesia Procedure Notes (Signed)
Performed by: Sherlin Sonier, CRNA Pre-anesthesia Checklist: Patient identified, Emergency Drugs available, Suction available, Patient being monitored and Timeout performed Oxygen Delivery Method: Nasal cannula       

## 2018-06-21 NOTE — Anesthesia Post-op Follow-up Note (Signed)
Anesthesia QCDR form completed.        

## 2018-06-21 NOTE — Anesthesia Postprocedure Evaluation (Signed)
Anesthesia Post Note  Patient: [name] [name]  Procedure(s) Performed: ENTEROSCOPY (N/A )  Patient location during evaluation: Endoscopy Anesthesia Type: General Level of consciousness: awake and alert Pain management: pain level controlled Vital Signs Assessment: post-procedure vital signs reviewed and stable Respiratory status: spontaneous breathing, nonlabored ventilation, respiratory function stable and patient connected to nasal cannula oxygen Cardiovascular status: blood pressure returned to baseline and stable Postop Assessment: no apparent nausea or vomiting Anesthetic complications: no     Last Vitals:  Vitals:   06/21/18 1027 06/21/18 1037  BP: (!) 153/69 (!) 168/65  Pulse: 82 87  Resp: 17 15  Temp:    SpO2: 99% 98%    Last Pain:  Vitals:   06/21/18 1037  TempSrc:   PainSc: 4                  Martha Clan

## 2018-06-22 LAB — TYPE AND SCREEN
ABO/RH(D): O NEG
Antibody Screen: NEGATIVE
Donor AG Type: NEGATIVE
PT AG Type: POSITIVE
Unit division: 0
Unit division: 0
Unit division: 0

## 2018-06-22 LAB — BPAM RBC
Blood Product Expiration Date: 202007142359
Blood Product Expiration Date: 202007152359
Blood Product Expiration Date: 202007152359
ISSUE DATE / TIME: 202006110006
Unit Type and Rh: 5100
Unit Type and Rh: 5100
Unit Type and Rh: 5100

## 2018-06-24 ENCOUNTER — Other Ambulatory Visit: Payer: Self-pay | Admitting: Oncology

## 2018-06-25 ENCOUNTER — Other Ambulatory Visit: Payer: Medicare Other

## 2018-06-25 ENCOUNTER — Ambulatory Visit: Payer: Medicare Other | Admitting: Oncology

## 2018-06-26 ENCOUNTER — Other Ambulatory Visit: Payer: Self-pay

## 2018-06-26 ENCOUNTER — Inpatient Hospital Stay: Payer: Medicare Other | Attending: Oncology

## 2018-06-26 VITALS — BP 137/71 | HR 73 | Temp 98.2°F | Resp 20

## 2018-06-26 DIAGNOSIS — D500 Iron deficiency anemia secondary to blood loss (chronic): Secondary | ICD-10-CM | POA: Diagnosis not present

## 2018-06-26 DIAGNOSIS — Z79899 Other long term (current) drug therapy: Secondary | ICD-10-CM | POA: Diagnosis not present

## 2018-06-26 MED ORDER — SODIUM CHLORIDE 0.9 % IV SOLN
510.0000 mg | Freq: Once | INTRAVENOUS | Status: AC
  Administered 2018-06-26: 510 mg via INTRAVENOUS
  Filled 2018-06-26: qty 17

## 2018-06-26 MED ORDER — SODIUM CHLORIDE 0.9 % IV SOLN
Freq: Once | INTRAVENOUS | Status: AC
  Administered 2018-06-26: 14:00:00 via INTRAVENOUS
  Filled 2018-06-26: qty 250

## 2018-06-26 NOTE — Discharge Summary (Signed)
[facility] NAME: [name] [name]    MR#:  [id number]  DATE OF BIRTH:  03/21/1925  DATE OF ADMISSION:  06/19/2018 ADMITTING PHYSICIAN: Lance Coon, MD  DATE OF DISCHARGE: 06/21/2018  3:41 PM  PRIMARY CARE PHYSICIAN: Kirk Ruths, MD   ADMISSION DIAGNOSIS:  Iron deficiency anemia due to chronic blood loss [D50.0] Gastrointestinal hemorrhage, unspecified gastrointestinal hemorrhage type [K92.2]  DISCHARGE DIAGNOSIS:  Principal Problem:   Melena Active Problems:   HTN (hypertension), benign   HLD (hyperlipidemia)   GERD (gastroesophageal reflux disease)   Chronic obstructive pulmonary disease ([place])   SECONDARY DIAGNOSIS:   Past Medical History:  Diagnosis Date  . Anemia   . COPD (chronic obstructive pulmonary disease) ([place])   . Diverticulitis   . GERD (gastroesophageal reflux disease)   . Glaucoma   . HLD (hyperlipidemia)   . Hypertension   . Iron deficiency   . Rectal bleeding      ADMITTING HISTORY  HISTORY OF PRESENT ILLNESS:  [name] [name]  is a 85 y.o. female who presents with chief complaint as above.  Patient presents the ED with a complaint of melena, which has been significantly worse for the past 2 days.  She states that she has had diagnosed upper GI bleed multiple times in the past, and had multiple endoscopies.  She describes what may have been a dual Foy lesion on one endoscopy.  Today her hemoglobin is down to 7, from apparent baseline of 11 in the recent past.  She was ordered 1 unit of packed red blood cells in the ED and hospitalist were called for admission   HOSPITAL COURSE:   * Acute blood loss anemia secondary to upper GI bleed.  Patient was transfused packed RBC.  Hemoglobin improved and remained stable.  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                        Treated with argon plasma coagulation (APC).  Discussed with Dr. Vicente Males and patient discharged home  in stable condition to follow-up with him in 1 week.  IV iron given prior to discharge.  Follow-up with Dr. Janese Banks at the oncology clinic for further IV iron infusions.  Hypertension, COPD, hyperlipidemia remained stable during hospital stay.  Discharging home in stable condition with no further bleeding and hemoglobin remaining stable follow-up with GI as outpatient.    CONSULTS OBTAINED:  Treatment Team:  [name], MD  DRUG ALLERGIES:   Allergies  Allergen Reactions  . Nsaids Hives  . Codeine Nausea And Vomiting  . Dextrans Hives  . Fentanyl Itching  . Lipitor [Atorvastatin] Other (See Comments)    Reaction: Muscle pain  . Lovastatin   . Mobic [Meloxicam] Other (See Comments)    Reaction: Mouth and tongue ulcers  . Niacin And Related Itching  . Other   . Polysaccharide K Hives  . Pravachol [Pravastatin Sodium] Other (See Comments)    Reaction: Muscle pain  . Statins Other (See Comments)  . Tolmetin Hives  . Voltaren [Diclofenac Sodium] Other (See Comments)    Reaction: Mouth and tongue ulcers  . Zetia [Ezetimibe] Other (See Comments)    Reaction: Leg pain  . Niacin Itching    DISCHARGE MEDICATIONS:   Allergies as of 06/21/2018      Reactions   Nsaids Hives   Codeine Nausea And Vomiting   Dextrans Hives   Fentanyl Itching  Lipitor [atorvastatin] Other (See Comments)   Reaction: Muscle pain   Lovastatin    Mobic [meloxicam] Other (See Comments)   Reaction: Mouth and tongue ulcers   Niacin And Related Itching   Other    Polysaccharide K Hives   Pravachol [pravastatin Sodium] Other (See Comments)   Reaction: Muscle pain   Statins Other (See Comments)   Tolmetin Hives   Voltaren [diclofenac Sodium] Other (See Comments)   Reaction: Mouth and tongue ulcers   Zetia [ezetimibe] Other (See Comments)   Reaction: Leg pain   Niacin Itching      Medication List    TAKE these medications   acetaminophen 500 MG tablet Commonly known as: TYLENOL Take 500-1,000  mg by mouth every 6 (six) hours as needed for mild pain, moderate pain or fever.   albuterol 108 (90 Base) MCG/ACT inhaler Commonly known as: VENTOLIN HFA Inhale 2 puffs into the lungs every 4 (four) hours as needed for wheezing or shortness of breath.   B-12 1000 MCG Caps Take 2,000 Units by mouth daily.   chlorthalidone 25 MG tablet Commonly known as: HYGROTON Take 12.5 mg by mouth daily.   Coenzyme Q10 10 MG capsule Take 10 mg by mouth daily.   cycloSPORINE 0.05 % ophthalmic emulsion Commonly known as: RESTASIS Place 1 drop into both eyes 2 (two) times daily.   escitalopram 20 MG tablet Commonly known as: LEXAPRO Take 20 mg by mouth daily.   fluticasone 110 MCG/ACT inhaler Commonly known as: FLOVENT HFA Inhale 2 puffs into the lungs 2 (two) times daily.   gabapentin 300 MG capsule Commonly known as: NEURONTIN Take 300 mg by mouth 2 (two) times daily.   guaiFENesin 600 MG 12 hr tablet Commonly known as: MUCINEX Take 600 mg by mouth 2 (two) times daily as needed for cough or to loosen phlegm.   losartan 50 MG tablet Commonly known as: COZAAR Take 50 mg by mouth daily.   mometasone 0.1 % lotion Commonly known as: ELOCON Apply 1 application topically daily as needed (skin irritation).   OCUVITE PRESERVISION PO Take 1 tablet by mouth 2 (two) times daily.   potassium chloride 10 MEQ tablet Commonly known as: K-DUR Take 10 mEq by mouth 3 (three) times daily.   RABEprazole 20 MG tablet Commonly known as: ACIPHEX Take 20 mg by mouth 2 (two) times daily.   sucralfate 1 g tablet Commonly known as: CARAFATE Take 1 tablet (1 g total) by mouth 4 (four) times daily -  with meals and at bedtime.   timolol 0.5 % ophthalmic gel-forming Commonly known as: TIMOPTIC-XR Place 1 drop into both eyes daily.   tiZANidine 4 MG tablet Commonly known as: ZANAFLEX Take 2-4 mg by mouth 2 (two) times daily as needed for muscle spasms.   valACYclovir 1000 MG tablet Commonly  known as: VALTREX Take 1,000 mg by mouth daily as needed (breakouts).       Today   VITAL SIGNS:  Blood pressure (!) 168/65, pulse 87, temperature (!) 97 F (36.1 C), temperature source Tympanic, resp. rate 15, height 5\' 8"  (1.727 m), weight 85.3 kg, SpO2 98 %.  I/O:  No intake or output data in the 24 hours ending 06/26/18 0125  PHYSICAL EXAMINATION:  Physical Exam  GENERAL:  85 y.o.-year-old patient lying in the bed with no acute distress.  LUNGS: Normal breath sounds bilaterally, no wheezing, rales,rhonchi or crepitation. No use of accessory muscles of respiration.  CARDIOVASCULAR: S1, S2 normal. No murmurs, rubs, or gallops.  ABDOMEN: Soft, non-tender, non-distended. Bowel sounds present. No organomegaly or mass.  NEUROLOGIC: Moves all 4 extremities. PSYCHIATRIC: The patient is alert and oriented x 3.  SKIN: No obvious rash, lesion, or ulcer.   DATA REVIEW:   CBC Recent Labs  Lab 06/20/18 0432  06/21/18 0709  WBC 5.8  --   --   HGB 8.2*   < > 9.6*  HCT 26.9*  --   --   PLT 342  --   --    < > = values in this interval not displayed.    Chemistries  Recent Labs  Lab 06/19/18 1744 06/20/18 0432  NA 137 139  K 4.1 3.6  CL 105 108  CO2 24 24  GLUCOSE 95 97  BUN 19 18  CREATININE 0.56 0.60  CALCIUM 8.8* 8.4*  AST 24  --   ALT 15  --   ALKPHOS 71  --   BILITOT 0.5  --     Cardiac Enzymes No results for input(s): TROPONINI in the last 168 hours.  Microbiology Results  Results for orders placed or performed during the hospital encounter of 06/19/18  SARS Coronavirus 2 (CEPHEID - Performed in [facility] hospital lab), Hosp Order     Status: None   Collection Time: 06/19/18 10:03 PM   Specimen: Nasopharyngeal Swab  Result Value Ref Range Status   SARS Coronavirus 2 NEGATIVE NEGATIVE Final    Comment: (NOTE) If result is NEGATIVE SARS-CoV-2 target nucleic acids are NOT DETECTED. The SARS-CoV-2 RNA is generally detectable in upper and lower   respiratory specimens during the acute phase of infection. The lowest  concentration of SARS-CoV-2 viral copies this assay can detect is 250  copies / mL. A negative result does not preclude SARS-CoV-2 infection  and should not be used as the sole basis for treatment or other  patient management decisions.  A negative result may occur with  improper specimen collection / handling, submission of specimen other  than nasopharyngeal swab, presence of viral mutation(s) within the  areas targeted by this assay, and inadequate number of viral copies  (<250 copies / mL). A negative result must be combined with clinical  observations, patient history, and epidemiological information. If result is POSITIVE SARS-CoV-2 target nucleic acids are DETECTED. The SARS-CoV-2 RNA is generally detectable in upper and lower  respiratory specimens dur ing the acute phase of infection.  Positive  results are indicative of active infection with SARS-CoV-2.  Clinical  correlation with patient history and other diagnostic information is  necessary to determine patient infection status.  Positive results do  not rule out bacterial infection or co-infection with other viruses. If result is PRESUMPTIVE POSTIVE SARS-CoV-2 nucleic acids MAY BE PRESENT.   A presumptive positive result was obtained on the submitted specimen  and confirmed on repeat testing.  While 2019 novel coronavirus  (SARS-CoV-2) nucleic acids may be present in the submitted sample  additional confirmatory testing may be necessary for epidemiological  and / or clinical management purposes  to differentiate between  SARS-CoV-2 and other Sarbecovirus currently known to infect humans.  If clinically indicated additional testing with an alternate test  methodology [phone]) is advised. The SARS-CoV-2 RNA is generally  detectable in upper and lower respiratory sp ecimens during the acute  phase of infection. The expected result is Negative. Fact  Sheet for Patients:  StrictlyIdeas.no Fact Sheet for Healthcare Providers: BankingDealers.co.za This test is not yet approved or cleared by the [place] FDA and has  been authorized for detection and/or diagnosis of SARS-CoV-2 by FDA under an Emergency Use Authorization (EUA).  This EUA will remain in effect (meaning this test can be used) for the duration of the COVID-19 declaration under Section 564(b)(1) of the Act, 21 U.S.C. section 360bbb-3(b)(1), unless the authorization is terminated or revoked sooner. Performed at [facility], [address] [place], [place] [address]     RADIOLOGY:  No results found.  Follow up with PCP in 1 week.  Management plans discussed with the patient, family and they are in agreement.  CODE STATUS:  Code Status History    Date Active Date Inactive Code Status Order ID Comments User Context   06/19/2018 2305 06/21/2018 1847 DNR [id number]  Lance Coon, MD Inpatient   05/30/2016 1239 05/31/2016 1624 DNR [id number]  Loletha Grayer, MD ED   05/09/2016 1615 05/12/2016 1904 Full Code [id number]  Demetrios Loll, MD Inpatient   04/12/2016 1716 04/15/2016 1610 Full Code [id number]  Bettey Costa, MD Inpatient   03/31/2016 2257 04/04/2016 1741 Partial Code [id number]  Harrie Foreman, MD Inpatient   03/31/2016 1925 03/31/2016 2257 Full Code [id number]  Loletha Grayer, MD ED   10/10/2015 2232 10/13/2015 2147 Full Code [id number]  Harvie Bridge, DO ED   08/11/2015 0141 08/13/2015 1858 Full Code [id number]  Lance Coon, MD Inpatient   Advance Care Planning Activity    Questions for Most Recent Historical Code Status (Order [id number])    Question Answer Comment   In the event of cardiac or respiratory ARREST Do not call a "code blue"    In the event of cardiac or respiratory ARREST Do not perform Intubation, CPR, defibrillation or ACLS    In the event of cardiac or respiratory ARREST Use medication by any route,  position, wound care, and other measures to relive pain and suffering. May use oxygen, suction and manual treatment of airway obstruction as needed for comfort.    Comments nurse may pronounce         Advance Directive Documentation     Most Recent Value  Type of Advance Directive  Out of facility DNR (pink MOST or yellow form)  Pre-existing out of facility DNR order (yellow form or pink MOST form)  -  "MOST" Form in Place?  -      TOTAL TIME TAKING CARE OF THIS PATIENT ON DAY OF DISCHARGE: more than 30 minutes.   Leia Alf Adeliz Tonkinson M.D on 06/26/2018 at 1:25 AM  Between 7am to 6pm - Pager - [phone]  After 6pm go to [email or web address] - password [email or web address] [facility]  Office  [phone]  CC: Primary care physician; Kirk Ruths, MD  Note: This dictation was prepared with Dragon dictation along with smaller phrase technology. Any transcriptional errors that result from this process are unintentional.

## 2018-07-02 ENCOUNTER — Other Ambulatory Visit: Payer: Self-pay

## 2018-07-02 ENCOUNTER — Telehealth: Payer: Self-pay | Admitting: Oncology

## 2018-07-02 NOTE — Telephone Encounter (Signed)
Spoke with pt to confirm appt date/time, do pre-appt screen which was completed, and adv of Covid-19 guidelines for appt regarding screening questions, temperature check, face mask required, and no visitors allowed °

## 2018-07-03 ENCOUNTER — Inpatient Hospital Stay: Payer: Medicare Other

## 2018-07-03 ENCOUNTER — Ambulatory Visit: Payer: Medicare Other | Admitting: Gastroenterology

## 2018-07-03 ENCOUNTER — Other Ambulatory Visit: Payer: Self-pay

## 2018-07-03 VITALS — BP 150/71 | HR 78 | Temp 96.2°F | Resp 18

## 2018-07-03 DIAGNOSIS — D500 Iron deficiency anemia secondary to blood loss (chronic): Secondary | ICD-10-CM | POA: Diagnosis not present

## 2018-07-03 MED ORDER — SODIUM CHLORIDE 0.9 % IV SOLN
510.0000 mg | Freq: Once | INTRAVENOUS | Status: AC
  Administered 2018-07-03: 15:00:00 510 mg via INTRAVENOUS
  Filled 2018-07-03: qty 17

## 2018-07-03 MED ORDER — SODIUM CHLORIDE 0.9 % IV SOLN
Freq: Once | INTRAVENOUS | Status: AC
  Administered 2018-07-03: 15:00:00 via INTRAVENOUS
  Filled 2018-07-03: qty 250

## 2018-07-03 NOTE — Progress Notes (Signed)
Pt tolerated infusion well. Pt and VS stable at discharge.  

## 2018-07-11 ENCOUNTER — Other Ambulatory Visit: Payer: Medicare Other

## 2018-07-11 ENCOUNTER — Ambulatory Visit: Payer: Medicare Other | Admitting: Oncology

## 2018-07-18 ENCOUNTER — Telehealth: Payer: Self-pay | Admitting: Gastroenterology

## 2018-07-18 NOTE — Telephone Encounter (Signed)
I spoke with patient to offer her to come in earlier for an appointment. She was very upset stating she would not be able to come because her daughter just passed away & she was alone.

## 2018-07-18 NOTE — Telephone Encounter (Signed)
Noted unfortunate news of the passing of her daughter. Appears she was set up for a follow up to recent hospitalization elsewhere where she had a GI bleed and treated for jejunal AVM's with APC.   Suggest she can follow up withy DR Janese Banks for regular CBC monitoring and if there is a drop and concern for a bleed then I can advise on endoscopy .   C/c Dr Janese Banks

## 2018-07-25 ENCOUNTER — Inpatient Hospital Stay: Payer: Medicare Other | Attending: Oncology

## 2018-07-25 ENCOUNTER — Encounter

## 2018-07-25 ENCOUNTER — Encounter: Payer: Self-pay | Admitting: Oncology

## 2018-07-25 ENCOUNTER — Inpatient Hospital Stay (HOSPITAL_BASED_OUTPATIENT_CLINIC_OR_DEPARTMENT_OTHER): Payer: Medicare Other | Admitting: Oncology

## 2018-07-25 ENCOUNTER — Other Ambulatory Visit: Payer: Self-pay

## 2018-07-25 ENCOUNTER — Ambulatory Visit (INDEPENDENT_AMBULATORY_CARE_PROVIDER_SITE_OTHER): Payer: Medicare Other | Admitting: Gastroenterology

## 2018-07-25 VITALS — BP 179/82 | HR 91 | Temp 98.2°F | Ht 68.0 in | Wt 189.0 lb

## 2018-07-25 VITALS — BP 123/78 | HR 78 | Temp 99.1°F | Resp 18 | Wt 186.0 lb

## 2018-07-25 DIAGNOSIS — R531 Weakness: Secondary | ICD-10-CM | POA: Insufficient documentation

## 2018-07-25 DIAGNOSIS — E785 Hyperlipidemia, unspecified: Secondary | ICD-10-CM

## 2018-07-25 DIAGNOSIS — Z87891 Personal history of nicotine dependence: Secondary | ICD-10-CM | POA: Insufficient documentation

## 2018-07-25 DIAGNOSIS — D500 Iron deficiency anemia secondary to blood loss (chronic): Secondary | ICD-10-CM

## 2018-07-25 DIAGNOSIS — Z79899 Other long term (current) drug therapy: Secondary | ICD-10-CM

## 2018-07-25 DIAGNOSIS — I10 Essential (primary) hypertension: Secondary | ICD-10-CM

## 2018-07-25 DIAGNOSIS — J449 Chronic obstructive pulmonary disease, unspecified: Secondary | ICD-10-CM

## 2018-07-25 DIAGNOSIS — K219 Gastro-esophageal reflux disease without esophagitis: Secondary | ICD-10-CM | POA: Diagnosis not present

## 2018-07-25 DIAGNOSIS — R5383 Other fatigue: Secondary | ICD-10-CM | POA: Insufficient documentation

## 2018-07-25 DIAGNOSIS — K5520 Angiodysplasia of colon without hemorrhage: Secondary | ICD-10-CM | POA: Diagnosis not present

## 2018-07-25 DIAGNOSIS — D509 Iron deficiency anemia, unspecified: Secondary | ICD-10-CM

## 2018-07-25 DIAGNOSIS — E538 Deficiency of other specified B group vitamins: Secondary | ICD-10-CM

## 2018-07-25 LAB — CBC WITH DIFFERENTIAL/PLATELET
Abs Immature Granulocytes: 0.01 10*3/uL (ref 0.00–0.07)
Basophils Absolute: 0.1 10*3/uL (ref 0.0–0.1)
Basophils Relative: 2 %
Eosinophils Absolute: 0.3 10*3/uL (ref 0.0–0.5)
Eosinophils Relative: 4 %
HCT: 34.4 % — ABNORMAL LOW (ref 36.0–46.0)
Hemoglobin: 11.1 g/dL — ABNORMAL LOW (ref 12.0–15.0)
Immature Granulocytes: 0 %
Lymphocytes Relative: 14 %
Lymphs Abs: 0.9 10*3/uL (ref 0.7–4.0)
MCH: 26.7 pg (ref 26.0–34.0)
MCHC: 32.3 g/dL (ref 30.0–36.0)
MCV: 82.9 fL (ref 80.0–100.0)
Monocytes Absolute: 0.9 10*3/uL (ref 0.1–1.0)
Monocytes Relative: 15 %
Neutro Abs: 4.2 10*3/uL (ref 1.7–7.7)
Neutrophils Relative %: 65 %
Platelets: 283 10*3/uL (ref 150–400)
RBC: 4.15 MIL/uL (ref 3.87–5.11)
Smear Review: ADEQUATE
WBC: 6.5 10*3/uL (ref 4.0–10.5)
nRBC: 0 % (ref 0.0–0.2)

## 2018-07-25 LAB — IRON AND TIBC
Iron: 66 ug/dL (ref 28–170)
Saturation Ratios: 21 % (ref 10.4–31.8)
TIBC: 308 ug/dL (ref 250–450)
UIBC: 242 ug/dL

## 2018-07-25 LAB — VITAMIN B12: Vitamin B-12: 3407 pg/mL — ABNORMAL HIGH (ref 180–914)

## 2018-07-25 LAB — FERRITIN: Ferritin: 274 ng/mL (ref 11–307)

## 2018-07-25 NOTE — Progress Notes (Signed)
[name] MD, MRCP(U.K) [address]  [place]  [place], [place] [address]  Main: [phone]  Fax: [phone]   Primary Care Physician: Kirk Ruths, MD  Primary Gastroenterologist:  Dr. Harland German follow-up from 06/20/2018.  HPI: [name] [name] is a 85 y.o. female    Summary of history [name] [name] is a 85 y.o. female is known to myself since 2018.  I have followed her for iron deficiency anemia, melena.  Back in 2018 a colonoscopy was negative but a capsule study showed proximal jejunal AVMs.  I ablated them with a push enteroscopy.  She is followed by hematology and receives IV iron from them.  She has had a few admissions for anemia.  She underwent a double-balloon enteroscopy in May June 2018 revealing 1 bleeding angiectasia in the jejunum which was ablated.  Repeat a capsule study in June 2019.  Found some coffee-ground material in the stomach and some AVMs in the mid small bowel.  A bleeding AVM in the stomach was noted which was treated. She was referred back to [facility] to evaluate for the AVM seen in the mid small bowel on the capsule study.  Enteroscopy was held off as her hemoglobin was stable.  She presented to the hospital on 06/19/2018 with melena.  Hemoglobin was down to 7.3 g from a baseline of 11.75 a few months back.  MCV also dropped to 75.1.  She has found to have iron deficiency.  She follows with Dr. Janese Banks for IV iron.  I performed a push enteroscopy on 06/20/2020 tiny nonbleeding small AVMs were seen in the proximal jejunum that were ablated with APC.  And she was discharged home.  On 06/26/2018 she was seen by [facility] I do not see any clear plan from her last office note.  No mention if there is a plan for a repeat capsule study.  She was seen by Dr. Janese Banks today.  Unfortunately her daughter passed away recently and she is very depressed.  No other active GI issues at this point of time.  She did mention that in the future she may  decide she is that she may not want any more treatment apart from IV iron and blood.  I said that when she did make that decision about no further procedures we would respect that completely. Current Outpatient Medications  Medication Sig Dispense Refill  . acetaminophen (TYLENOL) 500 MG tablet Take 500-1,000 mg by mouth every 6 (six) hours as needed for mild pain, moderate pain or fever.    Marland Kitchen albuterol (PROVENTIL HFA;VENTOLIN HFA) 108 (90 Base) MCG/ACT inhaler Inhale 2 puffs into the lungs every 4 (four) hours as needed for wheezing or shortness of breath.    . chlorthalidone (HYGROTON) 25 MG tablet Take 12.5 mg by mouth daily.     . Coenzyme Q10 10 MG capsule Take 10 mg by mouth daily.     . Cyanocobalamin (B-12) 1000 MCG CAPS Take 2,000 Units by mouth daily.     . cycloSPORINE (RESTASIS) 0.05 % ophthalmic emulsion Place 1 drop into both eyes 2 (two) times daily.    Marland Kitchen escitalopram (LEXAPRO) 20 MG tablet Take 20 mg by mouth daily.    . fluticasone (FLOVENT HFA) 110 MCG/ACT inhaler Inhale 2 puffs into the lungs 2 (two) times daily.    Marland Kitchen gabapentin (NEURONTIN) 300 MG capsule Take 300 mg by mouth 2 (two) times daily.    Marland Kitchen guaiFENesin (MUCINEX) 600 MG 12  hr tablet Take 600 mg by mouth 2 (two) times daily as needed for cough or to loosen phlegm.     Marland Kitchen losartan (COZAAR) 50 MG tablet Take 50 mg by mouth daily.    . mometasone (ELOCON) 0.1 % lotion Apply 1 application topically daily as needed (skin irritation).     . Multiple Vitamins-Minerals (OCUVITE PRESERVISION PO) Take 1 tablet by mouth 2 (two) times daily.    . potassium chloride (K-DUR,KLOR-CON) 10 MEQ tablet Take 10 mEq by mouth 3 (three) times daily.     . RABEprazole (ACIPHEX) 20 MG tablet Take 20 mg by mouth 2 (two) times daily.    . sucralfate (CARAFATE) 1 g tablet Take 1 tablet (1 g total) by mouth 4 (four) times daily -  with meals and at bedtime. 120 tablet 1  . timolol (TIMOPTIC-XR) 0.5 % ophthalmic gel-forming Place 1 drop into both  eyes daily.    Marland Kitchen tiZANidine (ZANAFLEX) 4 MG tablet Take 2-4 mg by mouth 2 (two) times daily as needed for muscle spasms.    . valACYclovir (VALTREX) 1000 MG tablet Take 1,000 mg by mouth daily as needed (breakouts).      No current facility-administered medications for this visit.    Facility-Administered Medications Ordered in Other Visits  Medication Dose Route Frequency Provider Last Rate Last Dose  . 0.9 %  sodium chloride infusion   Intravenous Continuous Sindy Guadeloupe, MD 0 mL/hr at 10/08/17 1518      Allergies as of 07/25/2018 - Review Complete 07/25/2018  Allergen Reaction Noted  . Nsaids Hives 08/10/2015  . Codeine Nausea And Vomiting 08/10/2015  . Dextrans Hives 08/10/2015  . Fentanyl Itching 08/10/2015  . Lipitor [atorvastatin] Other (See Comments) 08/10/2015  . Lovastatin    . Mobic [meloxicam] Other (See Comments) 08/10/2015  . Niacin and related Itching 08/10/2015  . Other    . Polysaccharide k Hives 08/10/2015  . Pravachol [pravastatin sodium] Other (See Comments) 08/10/2015  . Statins Other (See Comments) 03/31/2016  . Tolmetin Hives 08/10/2015  . Voltaren [diclofenac sodium] Other (See Comments) 08/10/2015  . Zetia [ezetimibe] Other (See Comments) 08/10/2015  . Niacin Itching 08/10/2015    ROS:  General: Negative for anorexia, weight loss, fever, chills, fatigue, weakness. ENT: Negative for hoarseness, difficulty swallowing , nasal congestion. CV: Negative for chest pain, angina, palpitations, dyspnea on exertion, peripheral edema.  Respiratory: Negative for dyspnea at rest, dyspnea on exertion, cough, sputum, wheezing.  GI: See history of present illness. GU:  Negative for dysuria, hematuria, urinary incontinence, urinary frequency, nocturnal urination.  Endo: Negative for unusual weight change.    Physical Examination:   There were no vitals taken for this visit.  General: Well-nourished, well-developed in no acute distress.  Eyes: No icterus.  Conjunctivae pink. Mouth: Oropharyngeal mucosa moist and pink , no lesions erythema or exudate. Lungs: Clear to auscultation bilaterally. Non-labored. Heart: Regular rate and rhythm, no murmurs rubs or gallops.  Abdomen: Bowel sounds are normal, nontender, nondistended, no hepatosplenomegaly or masses, no abdominal bruits or hernia , no rebound or guarding.   Extremities: No lower extremity edema. No clubbing or deformities. Neuro: Alert and oriented x 3.  Grossly intact. Skin: Warm and dry, no jaundice.   Psych: Alert and cooperative, normal mood and affect.   Imaging Studies: No results found.  Assessment and Plan:   [name] [name] is a 85 y.o. y/o female with a history of iron deficiency anemia.  Has had AVMs of the small bowel ablated.  She follows with [facility] as well in terms of small bowel enteroscopy.  Recently also seen by them.  Recent admission to the hospital for melena and treated with APC of small AVMs in the jejunum.  Since then doing okay and has received IV iron.  Hemoglobin is 11.1 g today.  Clinically she is doing well.  She does appear depressed which would be expected as her daughter passed away recently.  She will follow-up with Dr. Janese Banks for IV iron.    Dr [name]  MD,MRCP [facility]) Follow up in as needed

## 2018-07-25 NOTE — Progress Notes (Signed)
Hematology/Oncology Consult note [facility]  [phone] [phone]  Patient Care Team: Kirk Ruths, MD as PCP - General (Internal Medicine)   Name of the patient: [name] [name]  [id number]  1925/10/01   Date of visit: 07/25/18  Diagnosis-iron deficiency anemia  Chief complaint/ Reason for visit-routine follow-up of iron deficiency anemia  Heme/Onc history: Patient is a 85 year old female who was referred to the clinic in October 2017 for chronic iron deficiency anemia with history of diverticulosis. She has been unable to tolerate oral iron supplements due to abdominal cramping and diarrhea. She has required several blood transfusions in the past and received regular IV iron infusions. She has tolerated these without complications. Iron and blood transfusions have been given on a palliative basis with a goal hemoglobin of >10.   March 2018 she underwent an EGD and colonoscopy which did not reveal source of bleeding.  May 2/18 - enteroscopy revealed 3 avms which were ablated. She suffered melena and had a hemoglobin of 6.7 and was hospitalized. At that time, enteroscopy was repeated which was normal. She received a blood transfusion and her hemoglobin was 9.6.   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   08/07/2016 Capsule study showed active bleeding from single spot in proximal jejunum. She underwent push enteroscopy with argon laser therapy but no bleeding was found.   Interval history-she is still mourning the loss of her daughter.  Reports fatigue and states that her stools have been dark over the last 1 week.  ECOG PS- 2 Pain scale- 0 Opioid associated constipation- no  Review of systems- Review of Systems  Constitutional: Positive for malaise/fatigue. Negative for chills, fever and weight loss.  HENT: Negative for congestion, ear discharge and  nosebleeds.   Eyes: Negative for blurred vision.  Respiratory: Negative for cough, hemoptysis, sputum production, shortness of breath and wheezing.   Cardiovascular: Negative for chest pain, palpitations, orthopnea and claudication.  Gastrointestinal: Positive for melena. Negative for abdominal pain, blood in stool, constipation, diarrhea, heartburn, nausea and vomiting.  Genitourinary: Negative for dysuria, flank pain, frequency, hematuria and urgency.  Musculoskeletal: Negative for back pain, joint pain and myalgias.  Skin: Negative for rash.  Neurological: Negative for dizziness, tingling, focal weakness, seizures, weakness and headaches.  Endo/Heme/Allergies: Does not bruise/bleed easily.  Psychiatric/Behavioral: Negative for depression and suicidal ideas. The patient does not have insomnia.       Allergies  Allergen Reactions   Nsaids Hives   Codeine Nausea And Vomiting   Dextrans Hives   Fentanyl Itching   Lipitor [Atorvastatin] Other (See Comments)    Reaction: Muscle pain   Lovastatin    Mobic [Meloxicam] Other (See Comments)    Reaction: Mouth and tongue ulcers   Niacin And Related Itching   Other    Polysaccharide K Hives   Pravachol [Pravastatin Sodium] Other (See Comments)    Reaction: Muscle pain   Statins Other (See Comments)   Tolmetin Hives   Voltaren [Diclofenac Sodium] Other (See Comments)    Reaction: Mouth and tongue ulcers   Zetia [Ezetimibe] Other (See Comments)    Reaction: Leg pain   Niacin Itching     Past Medical History:  Diagnosis Date   Anemia    COPD (chronic obstructive pulmonary disease) (HCC)    Diverticulitis    GERD (gastroesophageal reflux disease)    Glaucoma    HLD (hyperlipidemia)    Hypertension  Iron deficiency    Rectal bleeding      Past Surgical History:  Procedure Laterality Date   ABDOMINAL HYSTERECTOMY     APPENDECTOMY     COLONOSCOPY WITH PROPOFOL N/A 04/14/2016   Procedure:  COLONOSCOPY WITH PROPOFOL;  Surgeon: Lucilla Lame, MD;  Location: [facility] ENDOSCOPY;  Service: Endoscopy;  Laterality: N/A;   ENTEROSCOPY N/A 05/10/2016   Procedure: Push ENTEROSCOPY with pediatric colonoscope;  Surgeon: [name], MD;  Location: [facility] ENDOSCOPY;  Service: Endoscopy;  Laterality: N/A;   ENTEROSCOPY N/A 05/30/2016   Procedure: push ENTEROSCOPY;  Surgeon: [name], MD;  Location: [facility] ENDOSCOPY;  Service: Endoscopy;  Laterality: N/A;   ENTEROSCOPY N/A 07/05/2017   Procedure: ENTEROSCOPY;  Surgeon: [name], MD;  Location: [facility] ENDOSCOPY;  Service: Gastroenterology;  Laterality: N/A;   ENTEROSCOPY N/A 06/21/2018   Procedure: ENTEROSCOPY;  Surgeon: [name], MD;  Location: [facility] ENDOSCOPY;  Service: Gastroenterology;  Laterality: N/A;   ESOPHAGOGASTRODUODENOSCOPY (EGD) WITH PROPOFOL N/A 08/12/2015   Procedure: ESOPHAGOGASTRODUODENOSCOPY (EGD) WITH PROPOFOL;  Surgeon: Lollie Sails, MD;  Location: [facility] ENDOSCOPY;  Service: Endoscopy;  Laterality: N/A;   ESOPHAGOGASTRODUODENOSCOPY (EGD) WITH PROPOFOL N/A 04/03/2016   Procedure: ESOPHAGOGASTRODUODENOSCOPY (EGD) WITH PROPOFOL;  Surgeon: Lucilla Lame, MD;  Location: [facility] ENDOSCOPY;  Service: Endoscopy;  Laterality: N/A;   GIVENS CAPSULE STUDY N/A 04/28/2016   Procedure: GIVENS CAPSULE STUDY;  Surgeon: [name], MD;  Location: [facility] ENDOSCOPY;  Service: Endoscopy;  Laterality: N/A;   GIVENS CAPSULE STUDY N/A 06/21/2017   Procedure: GIVENS CAPSULE STUDY;  Surgeon: [name], MD;  Location: [facility] ENDOSCOPY;  Service: Gastroenterology;  Laterality: N/A;   JOINT REPLACEMENT      Social History   Socioeconomic History   Marital status: Widowed    Spouse name: Not on file   Number of children: Not on file   Years of education: Not on file   Highest education level: Not on file  Occupational History   Not on file  Social Needs   Financial resource strain: Not on file   Food insecurity    Worry: Not on file     Inability: Not on file   Transportation needs    Medical: Not on file    Non-medical: Not on file  Tobacco Use   Smoking status: Former Smoker    Types: Cigarettes   Smokeless tobacco: Never Used  Substance and Sexual Activity   Alcohol use: No   Drug use: No   Sexual activity: Not on file  Lifestyle   Physical activity    Days per week: Not on file    Minutes per session: Not on file   Stress: Not on file  Relationships   Social connections    Talks on phone: Not on file    Gets together: Not on file    Attends religious service: Not on file    Active member of club or organization: Not on file    Attends meetings of clubs or organizations: Not on file    Relationship status: Not on file   Intimate partner violence    Fear of current or ex partner: Not on file    Emotionally abused: Not on file    Physically abused: Not on file    Forced sexual activity: Not on file  Other Topics Concern   Not on file  Social History Narrative   Not on file    Family History  Problem Relation Age of Onset   CAD Mother    CAD  Father    CAD Brother      Current Outpatient Medications:    acetaminophen (TYLENOL) 500 MG tablet, Take 500-1,000 mg by mouth every 6 (six) hours as needed for mild pain, moderate pain or fever., Disp: , Rfl:    albuterol (PROVENTIL HFA;VENTOLIN HFA) 108 (90 Base) MCG/ACT inhaler, Inhale 2 puffs into the lungs every 4 (four) hours as needed for wheezing or shortness of breath., Disp: , Rfl:    chlorthalidone (HYGROTON) 25 MG tablet, Take 12.5 mg by mouth daily. , Disp: , Rfl:    Coenzyme Q10 10 MG capsule, Take 10 mg by mouth daily. , Disp: , Rfl:    Cyanocobalamin (B-12) 1000 MCG CAPS, Take 2,000 Units by mouth daily. , Disp: , Rfl:    cycloSPORINE (RESTASIS) 0.05 % ophthalmic emulsion, Place 1 drop into both eyes 2 (two) times daily., Disp: , Rfl:    escitalopram (LEXAPRO) 20 MG tablet, Take 20 mg by mouth daily., Disp: , Rfl:     fluticasone (FLOVENT HFA) 110 MCG/ACT inhaler, Inhale 2 puffs into the lungs 2 (two) times daily., Disp: , Rfl:    gabapentin (NEURONTIN) 300 MG capsule, Take 300 mg by mouth 2 (two) times daily., Disp: , Rfl:    guaiFENesin (MUCINEX) 600 MG 12 hr tablet, Take 600 mg by mouth 2 (two) times daily as needed for cough or to loosen phlegm. , Disp: , Rfl:    losartan (COZAAR) 50 MG tablet, Take 50 mg by mouth daily., Disp: , Rfl:    mometasone (ELOCON) 0.1 % lotion, Apply 1 application topically daily as needed (skin irritation). , Disp: , Rfl:    Multiple Vitamins-Minerals (OCUVITE PRESERVISION PO), Take 1 tablet by mouth 2 (two) times daily., Disp: , Rfl:    potassium chloride (K-DUR,KLOR-CON) 10 MEQ tablet, Take 10 mEq by mouth 3 (three) times daily. , Disp: , Rfl:    RABEprazole (ACIPHEX) 20 MG tablet, Take 20 mg by mouth 2 (two) times daily., Disp: , Rfl:    sucralfate (CARAFATE) 1 g tablet, Take 1 tablet (1 g total) by mouth 4 (four) times daily -  with meals and at bedtime., Disp: 120 tablet, Rfl: 1   timolol (TIMOPTIC-XR) 0.5 % ophthalmic gel-forming, Place 1 drop into both eyes daily., Disp: , Rfl:    tiZANidine (ZANAFLEX) 4 MG tablet, Take 2-4 mg by mouth 2 (two) times daily as needed for muscle spasms., Disp: , Rfl:    valACYclovir (VALTREX) 1000 MG tablet, Take 1,000 mg by mouth daily as needed (breakouts). , Disp: , Rfl:  No current facility-administered medications for this visit.   Facility-Administered Medications Ordered in Other Visits:    0.9 %  sodium chloride infusion, , Intravenous, Continuous, Sindy Guadeloupe, MD, Last Rate: 0 mL/hr at 10/08/17 1518  Physical exam:  Vitals:   07/25/18 1022  BP: 123/78  Pulse: 78  Resp: 18  Temp: 99.1 F (37.3 C)  Weight: 186 lb (84.4 kg)   Physical Exam HENT:     Head: Normocephalic and atraumatic.  Eyes:     Pupils: Pupils are equal, round, and reactive to light.  Neck:     Musculoskeletal: Normal range of motion.    Cardiovascular:     Rate and Rhythm: Normal rate and regular rhythm.     Heart sounds: Normal heart sounds.  Pulmonary:     Effort: Pulmonary effort is normal.     Breath sounds: Normal breath sounds.  Abdominal:     General: Bowel  sounds are normal.     Palpations: Abdomen is soft.  Skin:    General: Skin is warm and dry.  Neurological:     Mental Status: She is alert and oriented to person, place, and time.      CMP Latest Ref Rng & Units 06/20/2018  Glucose 70 - 99 mg/dL 97  BUN 8 - 23 mg/dL 18  Creatinine 0.44 - 1.00 mg/dL 0.60  Sodium 135 - 145 mmol/L 139  Potassium 3.5 - 5.1 mmol/L 3.6  Chloride 98 - 111 mmol/L 108  CO2 22 - 32 mmol/L 24  Calcium 8.9 - 10.3 mg/dL 8.4(L)  Total Protein 6.5 - 8.1 g/dL -  Total Bilirubin 0.3 - 1.2 mg/dL -  Alkaline Phos 38 - 126 U/L -  AST 15 - 41 U/L -  ALT 0 - 44 U/L -   CBC Latest Ref Rng & Units 06/21/2018  WBC 4.0 - 10.5 K/uL -  Hemoglobin 12.0 - 15.0 g/dL 9.6(L)  Hematocrit 36.0 - 46.0 % -  Platelets 150 - 400 K/uL -      Assessment and plan- Patient is a 85 y.o. female with iron deficiency anemia secondary to small bowel bleed  Patient reports dark stools and possible melena over the last 1 week.  Hemoglobin today however is 11.1.  Iron studies are pending.Patient received 3 doses of Feraheme in June 2020.  If her iron studies continue to be low we will call her for more Feraheme.  Repeat CBC ferritin and iron studies in 2 months in 4 months and I will see her back in 4 months.  She will call us sooner if she notices worsening melena/bright red blood per rectum.  She recently underwent small bowel enteroscopy and enteroscopy in June 2020 by Dr. Vicente Males   Visit Diagnosis 1. Iron deficiency anemia, unspecified iron deficiency anemia type      Dr. Randa Evens, MD, MPH [facility] [phone] 07/25/2018 10:09 AM

## 2018-07-25 NOTE — Progress Notes (Signed)
Patient was discharged on 06/21/18 from a recent hospitalization with GI bleed and received blood transfusion.  Reports that she has started bleeding starting on the July 4th weekend.  She will have stomach pain that will be followed by black stools 2 days later.  The last episode of black stool was yesterday.  She is dealing with the recent loss of her daughter

## 2018-09-30 ENCOUNTER — Inpatient Hospital Stay: Payer: Medicare Other | Attending: Oncology

## 2018-09-30 ENCOUNTER — Other Ambulatory Visit: Payer: Self-pay

## 2018-09-30 DIAGNOSIS — D509 Iron deficiency anemia, unspecified: Secondary | ICD-10-CM | POA: Insufficient documentation

## 2018-09-30 LAB — CBC WITH DIFFERENTIAL/PLATELET
Abs Immature Granulocytes: 0.03 10*3/uL (ref 0.00–0.07)
Basophils Absolute: 0.1 10*3/uL (ref 0.0–0.1)
Basophils Relative: 2 %
Eosinophils Absolute: 0.3 10*3/uL (ref 0.0–0.5)
Eosinophils Relative: 3 %
HCT: 35.3 % — ABNORMAL LOW (ref 36.0–46.0)
Hemoglobin: 11.4 g/dL — ABNORMAL LOW (ref 12.0–15.0)
Immature Granulocytes: 0 %
Lymphocytes Relative: 15 %
Lymphs Abs: 1.3 10*3/uL (ref 0.7–4.0)
MCH: 28.1 pg (ref 26.0–34.0)
MCHC: 32.3 g/dL (ref 30.0–36.0)
MCV: 86.9 fL (ref 80.0–100.0)
Monocytes Absolute: 1.8 10*3/uL — ABNORMAL HIGH (ref 0.1–1.0)
Monocytes Relative: 20 %
Neutro Abs: 5.3 10*3/uL (ref 1.7–7.7)
Neutrophils Relative %: 60 %
Platelets: 269 10*3/uL (ref 150–400)
RBC: 4.06 MIL/uL (ref 3.87–5.11)
RDW: 13.8 % (ref 11.5–15.5)
WBC: 8.9 10*3/uL (ref 4.0–10.5)
nRBC: 0 % (ref 0.0–0.2)

## 2018-09-30 LAB — IRON AND TIBC
Iron: 21 ug/dL — ABNORMAL LOW (ref 28–170)
Saturation Ratios: 7 % — ABNORMAL LOW (ref 10.4–31.8)
TIBC: 315 ug/dL (ref 250–450)
UIBC: 294 ug/dL

## 2018-09-30 LAB — FERRITIN: Ferritin: 45 ng/mL (ref 11–307)

## 2018-10-01 ENCOUNTER — Other Ambulatory Visit: Payer: Self-pay | Admitting: Oncology

## 2018-10-01 NOTE — Progress Notes (Signed)
I have called pt and got her voicemail. I left message about ferritin level and the request that MD would like her to get 2 more doses of feraheme and left my number to call me back

## 2018-10-04 NOTE — Progress Notes (Signed)
Called pt back and able to get her on the phone. She said the day I called her earlier in the week she was at the dentist. She would like to get iron treatment and appt made 10/1 and 10/8. She is agreeable to both appts

## 2018-10-10 ENCOUNTER — Inpatient Hospital Stay: Payer: Medicare Other | Attending: Oncology

## 2018-10-10 ENCOUNTER — Other Ambulatory Visit: Payer: Self-pay

## 2018-10-10 VITALS — BP 126/67 | HR 64 | Temp 98.1°F | Resp 18

## 2018-10-10 DIAGNOSIS — Z79899 Other long term (current) drug therapy: Secondary | ICD-10-CM | POA: Diagnosis not present

## 2018-10-10 DIAGNOSIS — D500 Iron deficiency anemia secondary to blood loss (chronic): Secondary | ICD-10-CM | POA: Diagnosis present

## 2018-10-10 MED ORDER — SODIUM CHLORIDE 0.9 % IV SOLN
Freq: Once | INTRAVENOUS | Status: AC
  Administered 2018-10-10: 11:00:00 via INTRAVENOUS
  Filled 2018-10-10: qty 250

## 2018-10-10 MED ORDER — SODIUM CHLORIDE 0.9 % IV SOLN
510.0000 mg | Freq: Once | INTRAVENOUS | Status: AC
  Administered 2018-10-10: 510 mg via INTRAVENOUS
  Filled 2018-10-10: qty 17

## 2018-10-17 ENCOUNTER — Other Ambulatory Visit: Payer: Self-pay

## 2018-10-17 ENCOUNTER — Inpatient Hospital Stay: Payer: Medicare Other

## 2018-10-17 VITALS — BP 156/69 | HR 66 | Resp 18

## 2018-10-17 DIAGNOSIS — D500 Iron deficiency anemia secondary to blood loss (chronic): Secondary | ICD-10-CM

## 2018-10-17 MED ORDER — SODIUM CHLORIDE 0.9 % IV SOLN
510.0000 mg | Freq: Once | INTRAVENOUS | Status: AC
  Administered 2018-10-17: 510 mg via INTRAVENOUS
  Filled 2018-10-17: qty 17

## 2018-10-17 MED ORDER — SODIUM CHLORIDE 0.9 % IV SOLN
Freq: Once | INTRAVENOUS | Status: AC
  Administered 2018-10-17: 14:00:00 via INTRAVENOUS
  Filled 2018-10-17: qty 250

## 2018-10-28 ENCOUNTER — Other Ambulatory Visit: Payer: Medicare Other

## 2018-11-29 ENCOUNTER — Other Ambulatory Visit: Payer: Self-pay

## 2018-11-29 NOTE — Progress Notes (Signed)
Patient pre screened for office appointment, no questions or concerns today. Patient reminded of upcoming appointment time and date. 

## 2018-12-02 ENCOUNTER — Other Ambulatory Visit: Payer: Self-pay

## 2018-12-02 ENCOUNTER — Inpatient Hospital Stay: Payer: Medicare Other | Attending: Oncology

## 2018-12-02 ENCOUNTER — Inpatient Hospital Stay (HOSPITAL_BASED_OUTPATIENT_CLINIC_OR_DEPARTMENT_OTHER): Payer: Medicare Other | Admitting: Oncology

## 2018-12-02 VITALS — BP 191/84 | HR 77 | Temp 97.8°F | Wt 188.0 lb

## 2018-12-02 DIAGNOSIS — D509 Iron deficiency anemia, unspecified: Secondary | ICD-10-CM | POA: Diagnosis present

## 2018-12-02 DIAGNOSIS — I10 Essential (primary) hypertension: Secondary | ICD-10-CM | POA: Insufficient documentation

## 2018-12-02 DIAGNOSIS — Z79899 Other long term (current) drug therapy: Secondary | ICD-10-CM | POA: Insufficient documentation

## 2018-12-02 DIAGNOSIS — R0602 Shortness of breath: Secondary | ICD-10-CM | POA: Diagnosis not present

## 2018-12-02 DIAGNOSIS — K219 Gastro-esophageal reflux disease without esophagitis: Secondary | ICD-10-CM | POA: Diagnosis not present

## 2018-12-02 DIAGNOSIS — J449 Chronic obstructive pulmonary disease, unspecified: Secondary | ICD-10-CM | POA: Insufficient documentation

## 2018-12-02 DIAGNOSIS — Z87891 Personal history of nicotine dependence: Secondary | ICD-10-CM | POA: Diagnosis not present

## 2018-12-02 DIAGNOSIS — R5383 Other fatigue: Secondary | ICD-10-CM | POA: Insufficient documentation

## 2018-12-02 LAB — CBC WITH DIFFERENTIAL/PLATELET
Abs Immature Granulocytes: 0.02 10*3/uL (ref 0.00–0.07)
Basophils Absolute: 0.2 10*3/uL — ABNORMAL HIGH (ref 0.0–0.1)
Basophils Relative: 2 %
Eosinophils Absolute: 0.4 10*3/uL (ref 0.0–0.5)
Eosinophils Relative: 5 %
HCT: 38.6 % (ref 36.0–46.0)
Hemoglobin: 12.3 g/dL (ref 12.0–15.0)
Immature Granulocytes: 0 %
Lymphocytes Relative: 19 %
Lymphs Abs: 1.3 10*3/uL (ref 0.7–4.0)
MCH: 28.3 pg (ref 26.0–34.0)
MCHC: 31.9 g/dL (ref 30.0–36.0)
MCV: 88.7 fL (ref 80.0–100.0)
Monocytes Absolute: 1.1 10*3/uL — ABNORMAL HIGH (ref 0.1–1.0)
Monocytes Relative: 15 %
Neutro Abs: 4.1 10*3/uL (ref 1.7–7.7)
Neutrophils Relative %: 59 %
Platelets: 282 10*3/uL (ref 150–400)
RBC: 4.35 MIL/uL (ref 3.87–5.11)
RDW: 15.9 % — ABNORMAL HIGH (ref 11.5–15.5)
WBC: 7.1 10*3/uL (ref 4.0–10.5)
nRBC: 0 % (ref 0.0–0.2)

## 2018-12-02 LAB — FERRITIN: Ferritin: 120 ng/mL (ref 11–307)

## 2018-12-02 LAB — IRON AND TIBC
Iron: 54 ug/dL (ref 28–170)
Saturation Ratios: 19 % (ref 10.4–31.8)
TIBC: 288 ug/dL (ref 250–450)
UIBC: 234 ug/dL

## 2018-12-02 NOTE — Progress Notes (Signed)
Patient stated that she had been doing well. Patient stated that she lost a daughter in July and her sister yesterday.

## 2018-12-03 ENCOUNTER — Encounter: Payer: Self-pay | Admitting: Oncology

## 2018-12-03 NOTE — Progress Notes (Signed)
Hematology/Oncology Consult note [facility]  [phone] [phone]  Patient Care Team: Kirk Ruths, MD as PCP - General (Internal Medicine)   Name of the patient: [name] [name]  [id number]  September 25, 1925   Date of visit: 12/03/18  Diagnosis-iron deficiency anemia  Chief complaint/ Reason for visit-routine follow-up of iron deficiency anemia  Heme/Onc history: Patient is a 85 year old female who was referred to the clinic in October 2017 for chronic iron deficiency anemia with history of diverticulosis. She has been unable to tolerate oral iron supplements due to abdominal cramping and diarrhea. She has required several blood transfusions in the past and received regular IV iron infusions. She has tolerated these without complications. Iron and blood transfusions have been given on a palliative basis with a goal hemoglobin of >10.   March 2018 she underwent an EGD and colonoscopy which did not reveal source of bleeding.  May 2/18 - enteroscopy revealed 3 avms which were ablated. She suffered melena and had a hemoglobin of 6.7 and was hospitalized. At that time, enteroscopy was repeated which was normal. She received a blood transfusion and her hemoglobin was 9.6.   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   08/07/2016 Capsule study showed active bleeding from single spot in proximal jejunum. She underwent push enteroscopy with argon laser therapy but no bleeding was found.  Interval history-one of her sisters died yesterday.  She has been stressed because of that.  Reports chronic fatigue and exertional shortness of breath.  Denies any blood in her stools or dark tarry stools.  ECOG PS- 1 Pain scale- 0 Opioid associated constipation- no  Review of systems- Review of Systems  Constitutional: Positive for malaise/fatigue. Negative for chills, fever and weight  loss.  HENT: Negative for congestion, ear discharge and nosebleeds.   Eyes: Negative for blurred vision.  Respiratory: Positive for shortness of breath. Negative for cough, hemoptysis, sputum production and wheezing.   Cardiovascular: Negative for chest pain, palpitations, orthopnea and claudication.  Gastrointestinal: Negative for abdominal pain, blood in stool, constipation, diarrhea, heartburn, melena, nausea and vomiting.  Genitourinary: Negative for dysuria, flank pain, frequency, hematuria and urgency.  Musculoskeletal: Negative for back pain, joint pain and myalgias.  Skin: Negative for rash.  Neurological: Negative for dizziness, tingling, focal weakness, seizures, weakness and headaches.  Endo/Heme/Allergies: Does not bruise/bleed easily.  Psychiatric/Behavioral: Negative for depression and suicidal ideas. The patient does not have insomnia.       Allergies  Allergen Reactions  . Nsaids Hives  . Codeine Nausea And Vomiting  . Dextrans Hives  . Fentanyl Itching  . Lipitor [Atorvastatin] Other (See Comments)    Reaction: Muscle pain  . Lovastatin   . Mobic [Meloxicam] Other (See Comments)    Reaction: Mouth and tongue ulcers  . Niacin And Related Itching  . Other   . Polysaccharide K Hives  . Pravachol [Pravastatin Sodium] Other (See Comments)    Reaction: Muscle pain  . Statins Other (See Comments)  . Tolmetin Hives  . Voltaren [Diclofenac Sodium] Other (See Comments)    Reaction: Mouth and tongue ulcers  . Zetia [Ezetimibe] Other (See Comments)    Reaction: Leg pain  . Niacin Itching     Past Medical History:  Diagnosis Date  . Anemia   . COPD (chronic obstructive pulmonary disease) ([place])   . Diverticulitis   . GERD (gastroesophageal reflux disease)   . Glaucoma   . HLD (  hyperlipidemia)   . Hypertension   . Iron deficiency   . Rectal bleeding      Past Surgical History:  Procedure Laterality Date  . ABDOMINAL HYSTERECTOMY    . APPENDECTOMY    .  COLONOSCOPY WITH PROPOFOL N/A 04/14/2016   Procedure: COLONOSCOPY WITH PROPOFOL;  Surgeon: Lucilla Lame, MD;  Location: [facility] ENDOSCOPY;  Service: Endoscopy;  Laterality: N/A;  . ENTEROSCOPY N/A 05/10/2016   Procedure: Push ENTEROSCOPY with pediatric colonoscope;  Surgeon: [name], MD;  Location: [facility] ENDOSCOPY;  Service: Endoscopy;  Laterality: N/A;  . ENTEROSCOPY N/A 05/30/2016   Procedure: push ENTEROSCOPY;  Surgeon: [name], MD;  Location: [facility] ENDOSCOPY;  Service: Endoscopy;  Laterality: N/A;  . ENTEROSCOPY N/A 07/05/2017   Procedure: ENTEROSCOPY;  Surgeon: [name], MD;  Location: [facility] ENDOSCOPY;  Service: Gastroenterology;  Laterality: N/A;  . ENTEROSCOPY N/A 06/21/2018   Procedure: ENTEROSCOPY;  Surgeon: [name], MD;  Location: [facility] ENDOSCOPY;  Service: Gastroenterology;  Laterality: N/A;  . ESOPHAGOGASTRODUODENOSCOPY (EGD) WITH PROPOFOL N/A 08/12/2015   Procedure: ESOPHAGOGASTRODUODENOSCOPY (EGD) WITH PROPOFOL;  Surgeon: Lollie Sails, MD;  Location: [facility] ENDOSCOPY;  Service: Endoscopy;  Laterality: N/A;  . ESOPHAGOGASTRODUODENOSCOPY (EGD) WITH PROPOFOL N/A 04/03/2016   Procedure: ESOPHAGOGASTRODUODENOSCOPY (EGD) WITH PROPOFOL;  Surgeon: Lucilla Lame, MD;  Location: [facility] ENDOSCOPY;  Service: Endoscopy;  Laterality: N/A;  . GIVENS CAPSULE STUDY N/A 04/28/2016   Procedure: GIVENS CAPSULE STUDY;  Surgeon: [name], MD;  Location: [facility] ENDOSCOPY;  Service: Endoscopy;  Laterality: N/A;  . GIVENS CAPSULE STUDY N/A 06/21/2017   Procedure: GIVENS CAPSULE STUDY;  Surgeon: [name], MD;  Location: [facility] ENDOSCOPY;  Service: Gastroenterology;  Laterality: N/A;  . JOINT REPLACEMENT      Social History   Socioeconomic History  . Marital status: Widowed    Spouse name: Not on file  . Number of children: Not on file  . Years of education: Not on file  . Highest education level: Not on file  Occupational History  . Not on file  Social Needs  . Financial resource strain: Not on file   . Food insecurity    Worry: Not on file    Inability: Not on file  . Transportation needs    Medical: Not on file    Non-medical: Not on file  Tobacco Use  . Smoking status: Former Smoker    Types: Cigarettes  . Smokeless tobacco: Never Used  Substance and Sexual Activity  . Alcohol use: No  . Drug use: No  . Sexual activity: Not on file  Lifestyle  . Physical activity    Days per week: Not on file    Minutes per session: Not on file  . Stress: Not on file  Relationships  . Social Herbalist on phone: Not on file    Gets together: Not on file    Attends religious service: Not on file    Active member of club or organization: Not on file    Attends meetings of clubs or organizations: Not on file    Relationship status: Not on file  . Intimate partner violence    Fear of current or ex partner: Not on file    Emotionally abused: Not on file    Physically abused: Not on file    Forced sexual activity: Not on file  Other Topics Concern  . Not on file  Social History Narrative  . Not on file    Family History  Problem Relation Age of Onset  .  CAD Mother   . CAD Father   . CAD Brother      Current Outpatient Medications:  .  acetaminophen (TYLENOL) 500 MG tablet, Take 500-1,000 mg by mouth every 6 (six) hours as needed for mild pain, moderate pain or fever., Disp: , Rfl:  .  albuterol (PROVENTIL HFA;VENTOLIN HFA) 108 (90 Base) MCG/ACT inhaler, Inhale 2 puffs into the lungs every 4 (four) hours as needed for wheezing or shortness of breath., Disp: , Rfl:  .  chlorthalidone (HYGROTON) 25 MG tablet, Take 12.5 mg by mouth daily. , Disp: , Rfl:  .  Coenzyme Q10 10 MG capsule, Take 10 mg by mouth daily. , Disp: , Rfl:  .  Cyanocobalamin (B-12) 1000 MCG CAPS, Take 2,000 Units by mouth daily. , Disp: , Rfl:  .  cycloSPORINE (RESTASIS) 0.05 % ophthalmic emulsion, Place 1 drop into both eyes 2 (two) times daily., Disp: , Rfl:  .  escitalopram (LEXAPRO) 20 MG tablet,  Take 20 mg by mouth daily., Disp: , Rfl:  .  fluticasone (FLOVENT HFA) 110 MCG/ACT inhaler, Inhale 2 puffs into the lungs 2 (two) times daily., Disp: , Rfl:  .  gabapentin (NEURONTIN) 300 MG capsule, Take 300 mg by mouth 2 (two) times daily., Disp: , Rfl:  .  guaiFENesin (MUCINEX) 600 MG 12 hr tablet, Take 600 mg by mouth 2 (two) times daily as needed for cough or to loosen phlegm. , Disp: , Rfl:  .  losartan (COZAAR) 50 MG tablet, Take 50 mg by mouth daily., Disp: , Rfl:  .  mometasone (ELOCON) 0.1 % lotion, Apply 1 application topically daily as needed (skin irritation). , Disp: , Rfl:  .  Multiple Vitamins-Minerals (OCUVITE PRESERVISION PO), Take 1 tablet by mouth 2 (two) times daily., Disp: , Rfl:  .  potassium chloride (K-DUR,KLOR-CON) 10 MEQ tablet, Take 10 mEq by mouth 3 (three) times daily. , Disp: , Rfl:  .  RABEprazole (ACIPHEX) 20 MG tablet, Take 20 mg by mouth 2 (two) times daily., Disp: , Rfl:  .  sucralfate (CARAFATE) 1 g tablet, Take 1 tablet (1 g total) by mouth 4 (four) times daily -  with meals and at bedtime., Disp: 120 tablet, Rfl: 1 .  timolol (TIMOPTIC-XR) 0.5 % ophthalmic gel-forming, Place 1 drop into both eyes daily., Disp: , Rfl:  .  tiZANidine (ZANAFLEX) 4 MG tablet, Take 2-4 mg by mouth 2 (two) times daily as needed for muscle spasms., Disp: , Rfl:  .  valACYclovir (VALTREX) 1000 MG tablet, Take 1,000 mg by mouth daily as needed (breakouts). , Disp: , Rfl:  No current facility-administered medications for this visit.   Facility-Administered Medications Ordered in Other Visits:  .  0.9 %  sodium chloride infusion, , Intravenous, Continuous, Sindy Guadeloupe, MD, Last Rate: 0 mL/hr at 10/08/17 1518  Physical exam:  Vitals:   12/02/18 1301  BP: (!) 191/84  Pulse: 77  Temp: 97.8 F (36.6 C)  TempSrc: Tympanic  SpO2: 99%  Weight: 188 lb (85.3 kg)   Physical Exam Constitutional:      General: She is not in acute distress. HENT:     Head: Normocephalic and  atraumatic.  Eyes:     Pupils: Pupils are equal, round, and reactive to light.  Neck:     Musculoskeletal: Normal range of motion.  Cardiovascular:     Rate and Rhythm: Normal rate and regular rhythm.     Heart sounds: Normal heart sounds.  Pulmonary:  Effort: Pulmonary effort is normal.     Breath sounds: Normal breath sounds.  Abdominal:     General: Bowel sounds are normal.     Palpations: Abdomen is soft.  Skin:    General: Skin is warm and dry.  Neurological:     Mental Status: She is alert and oriented to person, place, and time.      CMP Latest Ref Rng & Units 06/20/2018  Glucose 70 - 99 mg/dL 97  BUN 8 - 23 mg/dL 18  Creatinine 0.44 - 1.00 mg/dL 0.60  Sodium 135 - 145 mmol/L 139  Potassium 3.5 - 5.1 mmol/L 3.6  Chloride 98 - 111 mmol/L 108  CO2 22 - 32 mmol/L 24  Calcium 8.9 - 10.3 mg/dL 8.4(L)  Total Protein 6.5 - 8.1 g/dL -  Total Bilirubin 0.3 - 1.2 mg/dL -  Alkaline Phos 38 - 126 U/L -  AST 15 - 41 U/L -  ALT 0 - 44 U/L -   CBC Latest Ref Rng & Units 12/02/2018  WBC 4.0 - 10.5 K/uL 7.1  Hemoglobin 12.0 - 15.0 g/dL 12.3  Hematocrit 36.0 - 46.0 % 38.6  Platelets 150 - 400 K/uL 282     Assessment and plan- Patient is a 85 y.o. female with iron deficiency anemia here for routine follow-up  Iron studies also came back normal.Patient is not currently anemic and hemoglobin is normal at 12.3.  She does not require any IV iron at this time.  In the past she has had a sudden drop in hemoglobin likely secondary to small bowel bleeding and follows up with Dr. Orvil Feil as well.  I will repeat CBC ferritin and iron studies in 3 months in 6 months and see her back in 6 months.  Uncontrolled hypertension: Patient will need to follow-up with Dr. Ouida Sills regarding this.  She is asymptomatic today and reports no headache chest pain or blurry vision.   Visit Diagnosis 1. Iron deficiency anemia, unspecified iron deficiency anemia type      Dr. Randa Evens, MD, MPH [facility]  at [facility] [id number] 12/03/2018 10:47 AM

## 2019-01-06 ENCOUNTER — Telehealth: Payer: Self-pay | Admitting: *Deleted

## 2019-01-06 NOTE — Telephone Encounter (Signed)
Yes she can take the covid vaccine

## 2019-01-06 NOTE — Telephone Encounter (Signed)
Patient informed of physician response 

## 2019-01-06 NOTE — Telephone Encounter (Signed)
Patient called reporting that she is to get the COVID vaccine at [facility] and wants to know if she is safe to take it with her condition. Please advise

## 2019-01-28 ENCOUNTER — Ambulatory Visit: Payer: Medicare Other | Admitting: Oncology

## 2019-01-28 ENCOUNTER — Other Ambulatory Visit: Payer: Medicare Other

## 2019-03-03 ENCOUNTER — Inpatient Hospital Stay: Payer: Medicare Other | Attending: Oncology

## 2019-03-03 ENCOUNTER — Other Ambulatory Visit: Payer: Self-pay

## 2019-03-03 ENCOUNTER — Other Ambulatory Visit: Payer: Self-pay | Admitting: Oncology

## 2019-03-03 DIAGNOSIS — D509 Iron deficiency anemia, unspecified: Secondary | ICD-10-CM | POA: Diagnosis present

## 2019-03-03 LAB — CBC WITH DIFFERENTIAL/PLATELET
Abs Immature Granulocytes: 0.02 10*3/uL (ref 0.00–0.07)
Basophils Absolute: 0.2 10*3/uL — ABNORMAL HIGH (ref 0.0–0.1)
Basophils Relative: 2 %
Eosinophils Absolute: 0.3 10*3/uL (ref 0.0–0.5)
Eosinophils Relative: 4 %
HCT: 33.1 % — ABNORMAL LOW (ref 36.0–46.0)
Hemoglobin: 10.5 g/dL — ABNORMAL LOW (ref 12.0–15.0)
Immature Granulocytes: 0 %
Lymphocytes Relative: 20 %
Lymphs Abs: 1.4 10*3/uL (ref 0.7–4.0)
MCH: 27.6 pg (ref 26.0–34.0)
MCHC: 31.7 g/dL (ref 30.0–36.0)
MCV: 87.1 fL (ref 80.0–100.0)
Monocytes Absolute: 1 10*3/uL (ref 0.1–1.0)
Monocytes Relative: 15 %
Neutro Abs: 4.1 10*3/uL (ref 1.7–7.7)
Neutrophils Relative %: 59 %
Platelets: 316 10*3/uL (ref 150–400)
RBC: 3.8 MIL/uL — ABNORMAL LOW (ref 3.87–5.11)
RDW: 12.9 % (ref 11.5–15.5)
WBC: 6.9 10*3/uL (ref 4.0–10.5)
nRBC: 0 % (ref 0.0–0.2)

## 2019-03-03 LAB — IRON AND TIBC
Iron: 32 ug/dL (ref 28–170)
Saturation Ratios: 9 % — ABNORMAL LOW (ref 10.4–31.8)
TIBC: 374 ug/dL (ref 250–450)
UIBC: 342 ug/dL

## 2019-03-03 LAB — FERRITIN: Ferritin: 15 ng/mL (ref 11–307)

## 2019-03-05 ENCOUNTER — Inpatient Hospital Stay: Payer: Medicare Other | Attending: Oncology

## 2019-03-05 ENCOUNTER — Other Ambulatory Visit: Payer: Self-pay

## 2019-03-05 VITALS — BP 131/57 | HR 65 | Temp 98.6°F | Resp 18

## 2019-03-05 DIAGNOSIS — Z79899 Other long term (current) drug therapy: Secondary | ICD-10-CM | POA: Diagnosis not present

## 2019-03-05 DIAGNOSIS — D500 Iron deficiency anemia secondary to blood loss (chronic): Secondary | ICD-10-CM | POA: Diagnosis present

## 2019-03-05 MED ORDER — SODIUM CHLORIDE 0.9 % IV SOLN
510.0000 mg | Freq: Once | INTRAVENOUS | Status: AC
  Administered 2019-03-05: 14:00:00 510 mg via INTRAVENOUS
  Filled 2019-03-05: qty 510

## 2019-03-05 MED ORDER — SODIUM CHLORIDE 0.9 % IV SOLN
Freq: Once | INTRAVENOUS | Status: AC
  Filled 2019-03-05: qty 250

## 2019-03-05 NOTE — Progress Notes (Signed)
Pt denies to stay full 30 minute post observation. Pt tolerated infusion well. Pt and VS stable at discharge.

## 2019-03-11 ENCOUNTER — Other Ambulatory Visit: Payer: Self-pay

## 2019-03-11 ENCOUNTER — Inpatient Hospital Stay: Payer: Medicare Other | Attending: Oncology

## 2019-03-11 VITALS — BP 135/78 | HR 78 | Resp 18

## 2019-03-11 DIAGNOSIS — Z79899 Other long term (current) drug therapy: Secondary | ICD-10-CM | POA: Insufficient documentation

## 2019-03-11 DIAGNOSIS — D500 Iron deficiency anemia secondary to blood loss (chronic): Secondary | ICD-10-CM

## 2019-03-11 DIAGNOSIS — D509 Iron deficiency anemia, unspecified: Secondary | ICD-10-CM | POA: Insufficient documentation

## 2019-03-11 MED ORDER — SODIUM CHLORIDE 0.9 % IV SOLN
Freq: Once | INTRAVENOUS | Status: AC
  Filled 2019-03-11: qty 250

## 2019-03-11 MED ORDER — SODIUM CHLORIDE 0.9 % IV SOLN
510.0000 mg | Freq: Once | INTRAVENOUS | Status: AC
  Administered 2019-03-11: 510 mg via INTRAVENOUS
  Filled 2019-03-11: qty 510

## 2019-06-02 ENCOUNTER — Other Ambulatory Visit: Payer: Self-pay

## 2019-06-02 ENCOUNTER — Inpatient Hospital Stay: Payer: Medicare Other | Attending: Oncology

## 2019-06-02 ENCOUNTER — Inpatient Hospital Stay (HOSPITAL_BASED_OUTPATIENT_CLINIC_OR_DEPARTMENT_OTHER): Payer: Medicare Other | Admitting: Oncology

## 2019-06-02 ENCOUNTER — Encounter: Payer: Self-pay | Admitting: Oncology

## 2019-06-02 VITALS — BP 143/69 | HR 71 | Temp 98.1°F | Resp 16 | Wt 187.9 lb

## 2019-06-02 DIAGNOSIS — D509 Iron deficiency anemia, unspecified: Secondary | ICD-10-CM

## 2019-06-02 DIAGNOSIS — Z79899 Other long term (current) drug therapy: Secondary | ICD-10-CM | POA: Diagnosis not present

## 2019-06-02 DIAGNOSIS — I10 Essential (primary) hypertension: Secondary | ICD-10-CM | POA: Insufficient documentation

## 2019-06-02 DIAGNOSIS — E785 Hyperlipidemia, unspecified: Secondary | ICD-10-CM | POA: Diagnosis not present

## 2019-06-02 DIAGNOSIS — Z87891 Personal history of nicotine dependence: Secondary | ICD-10-CM | POA: Insufficient documentation

## 2019-06-02 DIAGNOSIS — K219 Gastro-esophageal reflux disease without esophagitis: Secondary | ICD-10-CM | POA: Insufficient documentation

## 2019-06-02 DIAGNOSIS — J449 Chronic obstructive pulmonary disease, unspecified: Secondary | ICD-10-CM | POA: Diagnosis not present

## 2019-06-02 LAB — CBC WITH DIFFERENTIAL/PLATELET
Abs Immature Granulocytes: 0.02 10*3/uL (ref 0.00–0.07)
Basophils Absolute: 0.1 10*3/uL (ref 0.0–0.1)
Basophils Relative: 2 %
Eosinophils Absolute: 0.2 10*3/uL (ref 0.0–0.5)
Eosinophils Relative: 3 %
HCT: 35.2 % — ABNORMAL LOW (ref 36.0–46.0)
Hemoglobin: 12 g/dL (ref 12.0–15.0)
Immature Granulocytes: 0 %
Lymphocytes Relative: 18 %
Lymphs Abs: 1.4 10*3/uL (ref 0.7–4.0)
MCH: 28.4 pg (ref 26.0–34.0)
MCHC: 34.1 g/dL (ref 30.0–36.0)
MCV: 83.4 fL (ref 80.0–100.0)
Monocytes Absolute: 1.4 10*3/uL — ABNORMAL HIGH (ref 0.1–1.0)
Monocytes Relative: 18 %
Neutro Abs: 4.8 10*3/uL (ref 1.7–7.7)
Neutrophils Relative %: 59 %
Platelets: 327 10*3/uL (ref 150–400)
RBC: 4.22 MIL/uL (ref 3.87–5.11)
RDW: 15 % (ref 11.5–15.5)
WBC: 8 10*3/uL (ref 4.0–10.5)
nRBC: 0 % (ref 0.0–0.2)

## 2019-06-02 LAB — IRON AND TIBC
Iron: 42 ug/dL (ref 28–170)
Saturation Ratios: 12 % (ref 10.4–31.8)
TIBC: 342 ug/dL (ref 250–450)
UIBC: 300 ug/dL

## 2019-06-02 LAB — FERRITIN: Ferritin: 53 ng/mL (ref 11–307)

## 2019-06-02 NOTE — Progress Notes (Signed)
Pt sob on exertion, and tired. Sometimes she feels herself wheezing and has inhaler for this and she does use it

## 2019-06-05 NOTE — Progress Notes (Signed)
Hematology/Oncology Consult note [facility]  [phone] [phone]  Patient Care Team: Kirk Ruths, MD as PCP - General (Internal Medicine)   Name of the patient: [name] [name]  [id number]  Jun 02, 1925   Date of visit: 06/05/19  Diagnosis-iron deficiency anemia  Chief complaint/ Reason for visit-routine follow-up of iron deficiency anemia  Heme/Onc history: Patient is a 86 year old female who was referred to the clinic in October 2017 for chronic iron deficiency anemia with history of diverticulosis. She has been unable to tolerate oral iron supplements due to abdominal cramping and diarrhea. She has required several blood transfusions in the past and received regular IV iron infusions. She has tolerated these without complications. Iron and blood transfusions have been given on a palliative basis with a goal hemoglobin of >10.   March 2018 she underwent an EGD and colonoscopy which did not reveal source of bleeding.  May 2/18 - enteroscopy revealed 3 avms which were ablated. She suffered melena and had a hemoglobin of 6.7 and was hospitalized. At that time, enteroscopy was repeated which was normal. She received a blood transfusion and her hemoglobin was 9.6.   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   08/07/2016 Capsule study showed active bleeding from single spot in proximal jejunum. She underwent push enteroscopy with argon laser therapy but no bleeding was found  Interval history-patient is currently doing well for her age.  She denies any melena or bright red blood in her stools.  She remains active.  She does have occasional fatigue and reports some shortness of breath on exertion.  ECOG PS- 1 Pain scale- 0  Review of systems- Review of Systems  Constitutional: Positive for malaise/fatigue. Negative for chills, fever and weight loss.  HENT: Negative  for congestion, ear discharge and nosebleeds.   Eyes: Negative for blurred vision.  Respiratory: Positive for shortness of breath. Negative for cough, hemoptysis, sputum production and wheezing.   Cardiovascular: Negative for chest pain, palpitations, orthopnea and claudication.  Gastrointestinal: Negative for abdominal pain, blood in stool, constipation, diarrhea, heartburn, melena, nausea and vomiting.  Genitourinary: Negative for dysuria, flank pain, frequency, hematuria and urgency.  Musculoskeletal: Negative for back pain, joint pain and myalgias.  Skin: Negative for rash.  Neurological: Negative for dizziness, tingling, focal weakness, seizures, weakness and headaches.  Endo/Heme/Allergies: Does not bruise/bleed easily.  Psychiatric/Behavioral: Negative for depression and suicidal ideas. The patient does not have insomnia.        Allergies  Allergen Reactions  . Nsaids Hives  . Codeine Nausea And Vomiting  . Dextrans Hives  . Fentanyl Itching  . Lipitor [Atorvastatin] Other (See Comments)    Reaction: Muscle pain  . Lovastatin   . Mobic [Meloxicam] Other (See Comments)    Reaction: Mouth and tongue ulcers  . Niacin And Related Itching  . Other   . Polysaccharide K Hives  . Pravachol [Pravastatin Sodium] Other (See Comments)    Reaction: Muscle pain  . Statins Other (See Comments)  . Tolmetin Hives  . Voltaren [Diclofenac Sodium] Other (See Comments)    Reaction: Mouth and tongue ulcers  . Zetia [Ezetimibe] Other (See Comments)    Reaction: Leg pain  . Niacin Itching     Past Medical History:  Diagnosis Date  . Anemia   . COPD (chronic obstructive pulmonary disease) ([place])   . Diverticulitis   . GERD (gastroesophageal reflux disease)   . Glaucoma   .  HLD (hyperlipidemia)   . Hypertension   . Iron deficiency   . Rectal bleeding      Past Surgical History:  Procedure Laterality Date  . ABDOMINAL HYSTERECTOMY    . APPENDECTOMY    . COLONOSCOPY WITH PROPOFOL  N/A 04/14/2016   Procedure: COLONOSCOPY WITH PROPOFOL;  Surgeon: Lucilla Lame, MD;  Location: [facility] ENDOSCOPY;  Service: Endoscopy;  Laterality: N/A;  . ENTEROSCOPY N/A 05/10/2016   Procedure: Push ENTEROSCOPY with pediatric colonoscope;  Surgeon: [name], MD;  Location: [facility] ENDOSCOPY;  Service: Endoscopy;  Laterality: N/A;  . ENTEROSCOPY N/A 05/30/2016   Procedure: push ENTEROSCOPY;  Surgeon: [name], MD;  Location: [facility] ENDOSCOPY;  Service: Endoscopy;  Laterality: N/A;  . ENTEROSCOPY N/A 07/05/2017   Procedure: ENTEROSCOPY;  Surgeon: [name], MD;  Location: [facility] ENDOSCOPY;  Service: Gastroenterology;  Laterality: N/A;  . ENTEROSCOPY N/A 06/21/2018   Procedure: ENTEROSCOPY;  Surgeon: [name], MD;  Location: [facility] ENDOSCOPY;  Service: Gastroenterology;  Laterality: N/A;  . ESOPHAGOGASTRODUODENOSCOPY (EGD) WITH PROPOFOL N/A 08/12/2015   Procedure: ESOPHAGOGASTRODUODENOSCOPY (EGD) WITH PROPOFOL;  Surgeon: Lollie Sails, MD;  Location: [facility] ENDOSCOPY;  Service: Endoscopy;  Laterality: N/A;  . ESOPHAGOGASTRODUODENOSCOPY (EGD) WITH PROPOFOL N/A 04/03/2016   Procedure: ESOPHAGOGASTRODUODENOSCOPY (EGD) WITH PROPOFOL;  Surgeon: Lucilla Lame, MD;  Location: [facility] ENDOSCOPY;  Service: Endoscopy;  Laterality: N/A;  . GIVENS CAPSULE STUDY N/A 04/28/2016   Procedure: GIVENS CAPSULE STUDY;  Surgeon: [name], MD;  Location: [facility] ENDOSCOPY;  Service: Endoscopy;  Laterality: N/A;  . GIVENS CAPSULE STUDY N/A 06/21/2017   Procedure: GIVENS CAPSULE STUDY;  Surgeon: [name], MD;  Location: [facility] ENDOSCOPY;  Service: Gastroenterology;  Laterality: N/A;  . JOINT REPLACEMENT      Social History   Socioeconomic History  . Marital status: Widowed    Spouse name: Not on file  . Number of children: Not on file  . Years of education: Not on file  . Highest education level: Not on file  Occupational History  . Not on file  Tobacco Use  . Smoking status: Former Smoker    Types: Cigarettes  . Smokeless  tobacco: Never Used  . Tobacco comment: over 30 years  Substance and Sexual Activity  . Alcohol use: Yes    Alcohol/week: 1.0 standard drinks    Types: 1 Glasses of wine per week    Comment: glass wine every 2 months  . Drug use: No  . Sexual activity: Not Currently    Birth control/protection: Post-menopausal  Other Topics Concern  . Not on file  Social History Narrative  . Not on file   Social Determinants of Health   Financial Resource Strain:   . Difficulty of Paying Living Expenses:   Food Insecurity:   . Worried About Charity fundraiser in the Last Year:   . Arboriculturist in the Last Year:   Transportation Needs:   . Film/video editor (Medical):   Marland Kitchen Lack of Transportation (Non-Medical):   Physical Activity:   . Days of Exercise per Week:   . Minutes of Exercise per Session:   Stress:   . Feeling of Stress :   Social Connections:   . Frequency of Communication with Friends and Family:   . Frequency of Social Gatherings with Friends and Family:   . Attends Religious Services:   . Active Member of Clubs or Organizations:   . Attends Archivist Meetings:   Marland Kitchen Marital Status:   Intimate Partner Violence:   .  Fear of Current or Ex-Partner:   . Emotionally Abused:   Marland Kitchen Physically Abused:   . Sexually Abused:     Family History  Problem Relation Age of Onset  . CAD Mother   . CAD Father   . CAD Brother      Current Outpatient Medications:  .  acetaminophen (TYLENOL) 500 MG tablet, Take 500-1,000 mg by mouth every 6 (six) hours as needed for mild pain, moderate pain or fever., Disp: , Rfl:  .  albuterol (PROVENTIL HFA;VENTOLIN HFA) 108 (90 Base) MCG/ACT inhaler, Inhale 2 puffs into the lungs every 4 (four) hours as needed for wheezing or shortness of breath., Disp: , Rfl:  .  chlorthalidone (HYGROTON) 25 MG tablet, Take 12.5 mg by mouth daily. , Disp: , Rfl:  .  Coenzyme Q10 10 MG capsule, Take 10 mg by mouth daily. , Disp: , Rfl:  .   Cyanocobalamin (B-12) 1000 MCG CAPS, Take 2,000 Units by mouth daily. , Disp: , Rfl:  .  cycloSPORINE (RESTASIS) 0.05 % ophthalmic emulsion, Place 1 drop into both eyes 2 (two) times daily., Disp: , Rfl:  .  escitalopram (LEXAPRO) 20 MG tablet, Take 20 mg by mouth daily., Disp: , Rfl:  .  fluticasone (FLOVENT HFA) 110 MCG/ACT inhaler, Inhale 2 puffs into the lungs 2 (two) times daily., Disp: , Rfl:  .  gabapentin (NEURONTIN) 300 MG capsule, Take 300 mg by mouth 2 (two) times daily., Disp: , Rfl:  .  guaiFENesin (MUCINEX) 600 MG 12 hr tablet, Take 600 mg by mouth 2 (two) times daily as needed for cough or to loosen phlegm. , Disp: , Rfl:  .  losartan (COZAAR) 50 MG tablet, Take 50 mg by mouth daily., Disp: , Rfl:  .  mometasone (ELOCON) 0.1 % lotion, Apply 1 application topically daily as needed (skin irritation). , Disp: , Rfl:  .  Multiple Vitamins-Minerals (OCUVITE PRESERVISION PO), Take 1 tablet by mouth 2 (two) times daily., Disp: , Rfl:  .  potassium chloride (K-DUR,KLOR-CON) 10 MEQ tablet, Take 10 mEq by mouth 3 (three) times daily. , Disp: , Rfl:  .  RABEprazole (ACIPHEX) 20 MG tablet, Take 20 mg by mouth 2 (two) times daily., Disp: , Rfl:  .  timolol (TIMOPTIC-XR) 0.5 % ophthalmic gel-forming, Place 1 drop into both eyes daily., Disp: , Rfl:  .  tiZANidine (ZANAFLEX) 4 MG tablet, Take 2-4 mg by mouth 2 (two) times daily as needed for muscle spasms., Disp: , Rfl:  .  valACYclovir (VALTREX) 1000 MG tablet, Take 1,000 mg by mouth daily as needed (breakouts). , Disp: , Rfl:  No current facility-administered medications for this visit.  Facility-Administered Medications Ordered in Other Visits:  .  0.9 %  sodium chloride infusion, , Intravenous, Continuous, Sindy Guadeloupe, MD, Last Rate: 0 mL/hr at 10/08/17 1518, New Bag at 06/21/18 0952  Physical exam:  Vitals:   06/02/19 1412  BP: (!) 143/69  Pulse: 71  Resp: 16  Temp: 98.1 F (36.7 C)  TempSrc: Oral  Weight: 187 lb 14.4 oz (85.2 kg)    Physical Exam Constitutional:      General: She is not in acute distress. Cardiovascular:     Rate and Rhythm: Normal rate and regular rhythm.     Heart sounds: Normal heart sounds.  Pulmonary:     Effort: Pulmonary effort is normal.     Breath sounds: Normal breath sounds.  Abdominal:     General: Bowel sounds are normal.  Palpations: Abdomen is soft.  Musculoskeletal:     Right lower leg: No edema.     Left lower leg: No edema.  Skin:    General: Skin is warm and dry.  Neurological:     Mental Status: She is alert and oriented to person, place, and time.      CMP Latest Ref Rng & Units 06/20/2018  Glucose 70 - 99 mg/dL 97  BUN 8 - 23 mg/dL 18  Creatinine 0.44 - 1.00 mg/dL 0.60  Sodium 135 - 145 mmol/L 139  Potassium 3.5 - 5.1 mmol/L 3.6  Chloride 98 - 111 mmol/L 108  CO2 22 - 32 mmol/L 24  Calcium 8.9 - 10.3 mg/dL 8.4(L)  Total Protein 6.5 - 8.1 g/dL -  Total Bilirubin 0.3 - 1.2 mg/dL -  Alkaline Phos 38 - 126 U/L -  AST 15 - 41 U/L -  ALT 0 - 44 U/L -   CBC Latest Ref Rng & Units 06/02/2019  WBC 4.0 - 10.5 K/uL 8.0  Hemoglobin 12.0 - 15.0 g/dL 12.0  Hematocrit 36.0 - 46.0 % 35.2(L)  Platelets 150 - 400 K/uL 327      Assessment and plan- Patient is a 86 y.o. female with iron deficiency anemia here for routine follow-up  Patient is currently not anemic and her iron studies are normal.  She does not require IV iron at this time.  She has suddenly dropped her hemoglobin in the past requiring IV iron on a periodic basis.  We will therefore continue monitoring her iron levels every 3 months and I will see her back in 6 months.  Exertional shortness of breath: I have recommended that she should speak to Dr. Ouida Sills if symptoms continue for further work-up   Visit Diagnosis 1. Iron deficiency anemia, unspecified iron deficiency anemia type      Dr. Randa Evens, MD, MPH [facility] [id number] 06/05/2019 10:27 AM

## 2019-06-18 IMAGING — CT CT HEAD W/O CM
3 series · 16 of 46 positions shown, 19 images · non-contrast
Comparison: None.

CLINICAL DATA: Acute headache, severe, worst of life. Hypertension.

EXAM:
CT HEAD WITHOUT CONTRAST
TECHNIQUE: Contiguous axial images were obtained from the base of the skull
through the vertex without intravenous contrast.

[Series 3: head wo · axial · 0.40mm/px · z∈[+119,+239]mm · 10 of 29 slices shown, 13 images]
[im 3/29  brain]
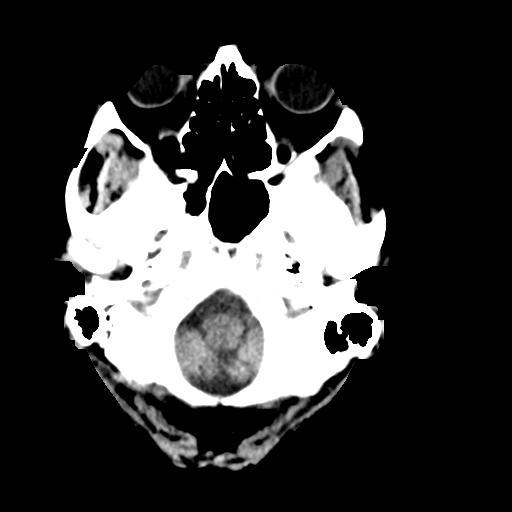
[im 3/29  bone]
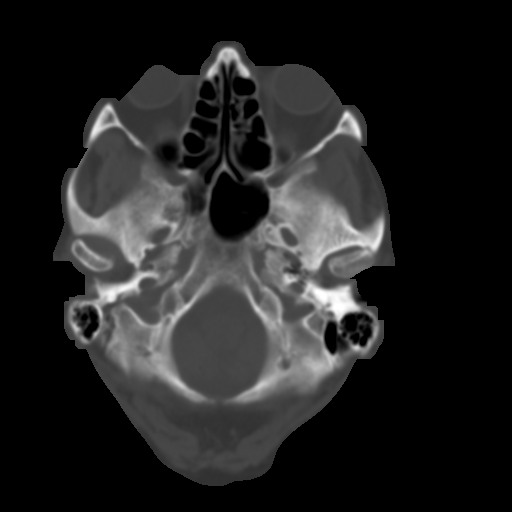
[im 6/29  brain]
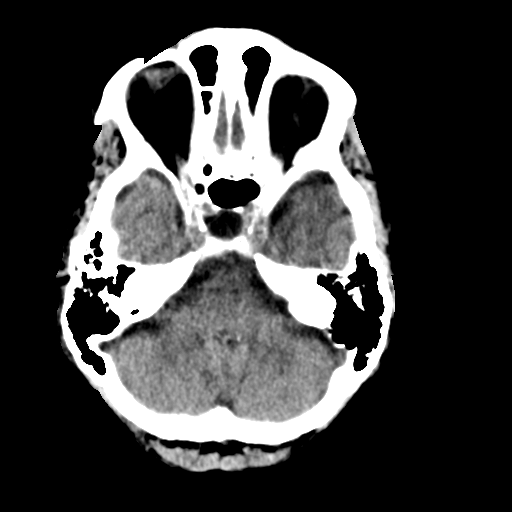
[im 8/29  brain]
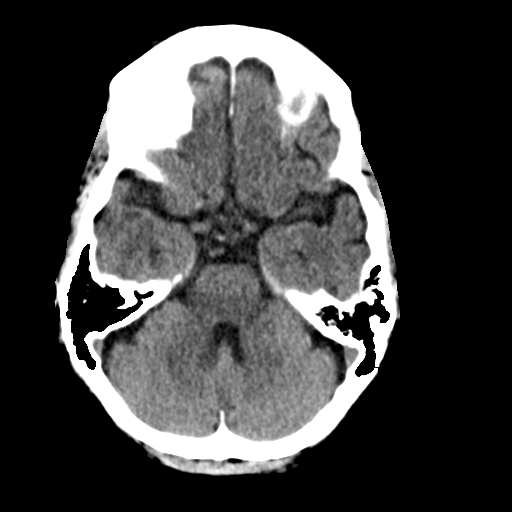
[im 11/29  brain]
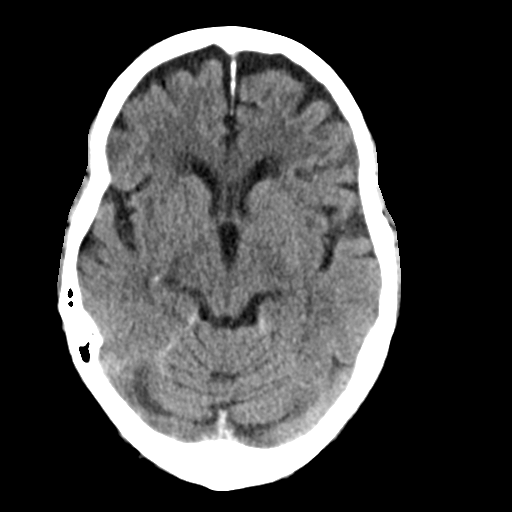
[im 14/29  brain]
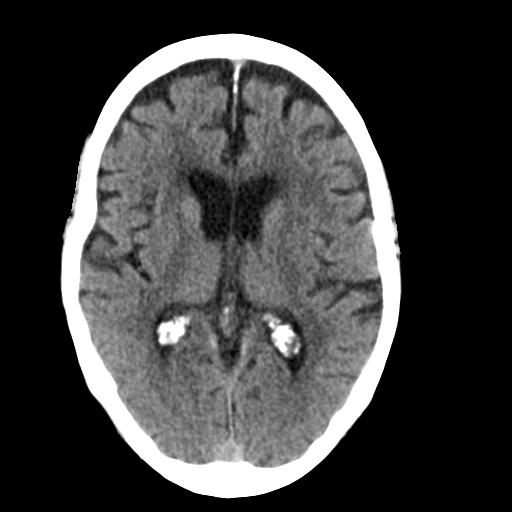
[im 14/29  bone]
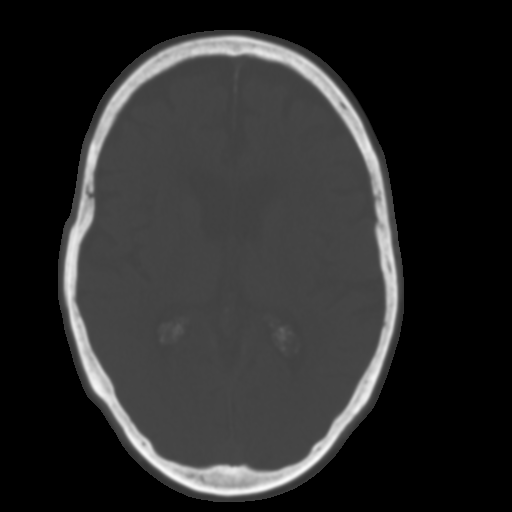
[im 16/29  brain]
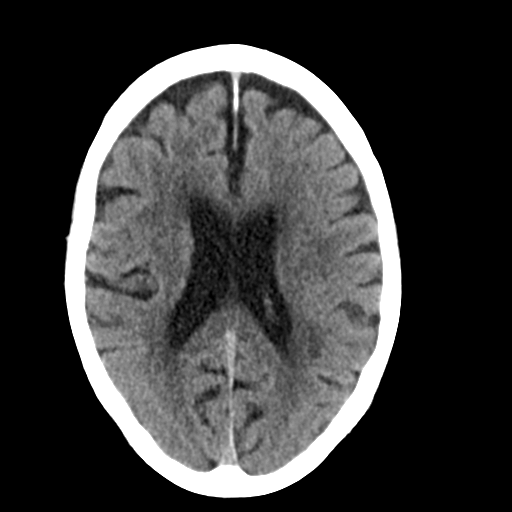
[im 19/29  brain]
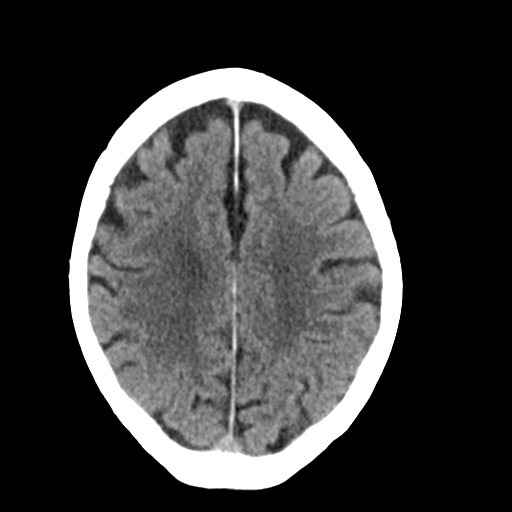
[im 22/29  brain]
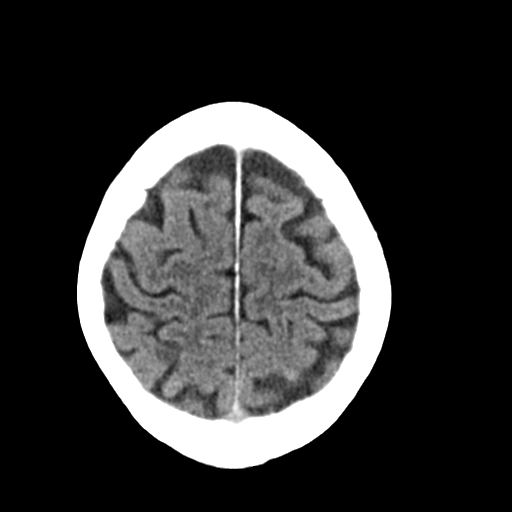
[im 24/29  brain]
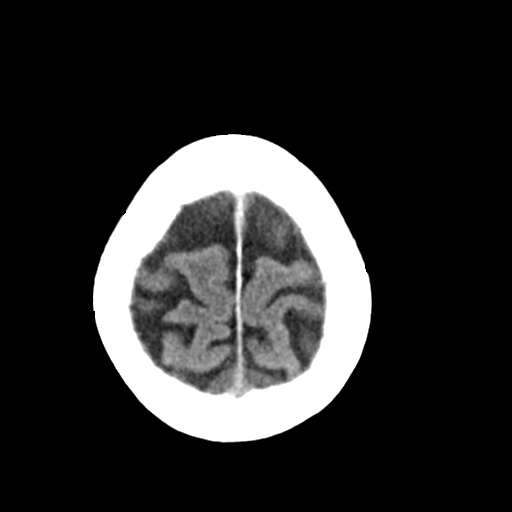
[im 24/29  bone]
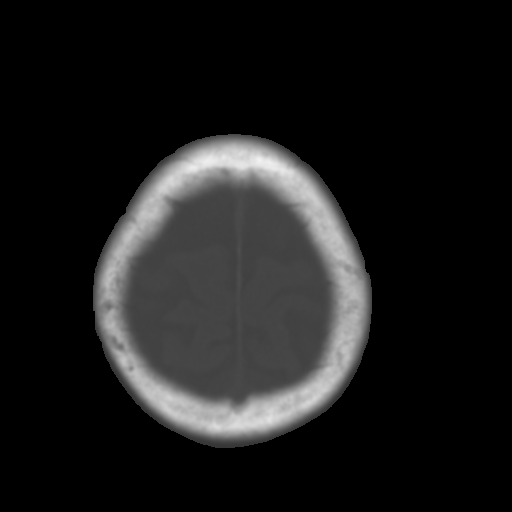
[im 27/29  brain]
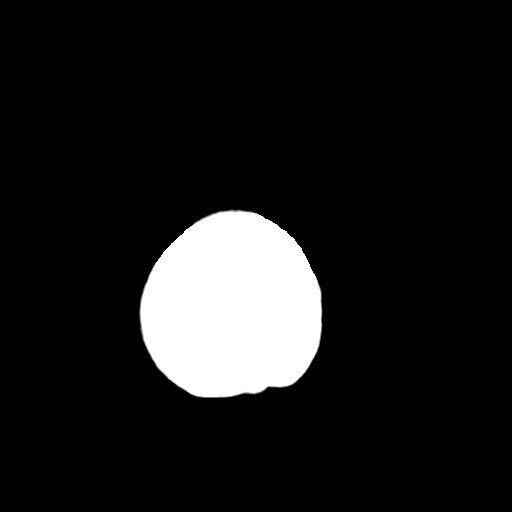

[Series 4: coronal soft tissue · coronal · 0.31mm/px · 3 of 65 slices shown]
[im 22/65  brain]
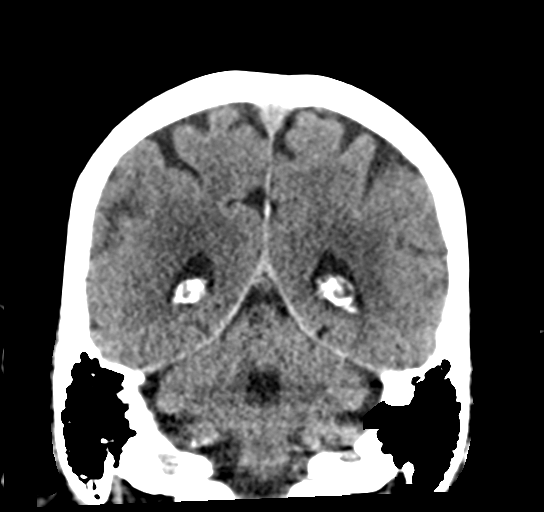
[im 29/65  brain]
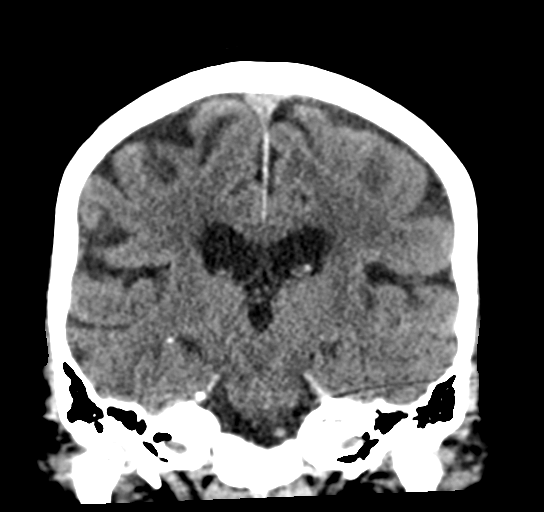
[im 36/65  brain]
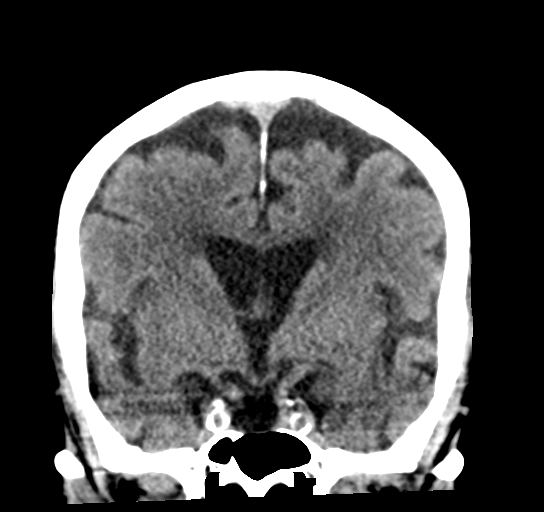

[Series 5: sagittal soft tissue · sagittal · 0.31mm/px · 3 of 58 slices shown]
[im 20/58  brain]
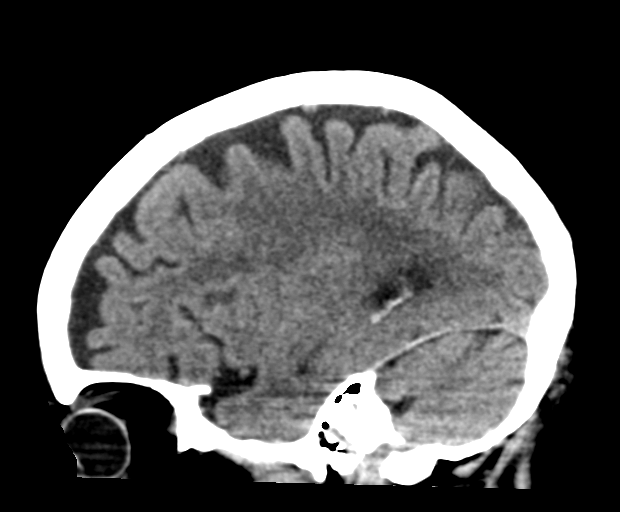
[im 29/58  brain]
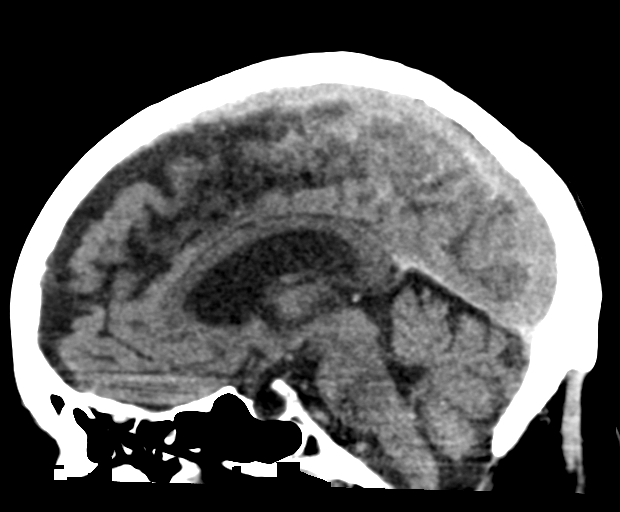
[im 39/58  brain]
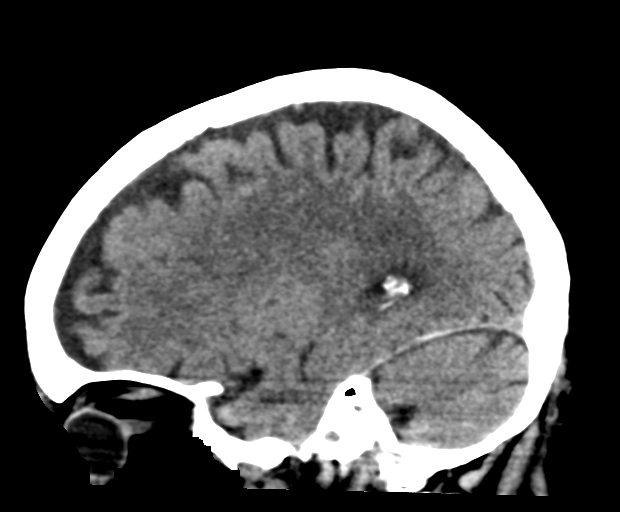

[16 of 46 positions shown; findings below may reference images not displayed]

FINDINGS: Brain: No evidence of acute infarction, hemorrhage, hydrocephalus,
extra-axial collection or mass lesion/mass effect. Patchy
low-density in the cerebral white matter attributed to chronic small
vessel ischemia. Mild for age cerebral volume loss.

Vascular: No hyperdense vessel.  Atherosclerotic calcification.

Skull: Negative

Sinuses/Orbits: Bilateral cataract resection.  No acute finding.
IMPRESSION: No acute finding.

## 2019-09-01 ENCOUNTER — Other Ambulatory Visit: Payer: Self-pay

## 2019-09-01 ENCOUNTER — Inpatient Hospital Stay: Payer: Medicare Other | Attending: Oncology

## 2019-09-01 DIAGNOSIS — D509 Iron deficiency anemia, unspecified: Secondary | ICD-10-CM | POA: Diagnosis not present

## 2019-09-01 DIAGNOSIS — Z79899 Other long term (current) drug therapy: Secondary | ICD-10-CM | POA: Diagnosis not present

## 2019-09-01 LAB — CBC WITH DIFFERENTIAL/PLATELET
Abs Immature Granulocytes: 0.03 10*3/uL (ref 0.00–0.07)
Basophils Absolute: 0.1 10*3/uL (ref 0.0–0.1)
Basophils Relative: 2 %
Eosinophils Absolute: 0.3 10*3/uL (ref 0.0–0.5)
Eosinophils Relative: 4 %
HCT: 30.7 % — ABNORMAL LOW (ref 36.0–46.0)
Hemoglobin: 10.2 g/dL — ABNORMAL LOW (ref 12.0–15.0)
Immature Granulocytes: 0 %
Lymphocytes Relative: 17 %
Lymphs Abs: 1.5 10*3/uL (ref 0.7–4.0)
MCH: 26.2 pg (ref 26.0–34.0)
MCHC: 33.2 g/dL (ref 30.0–36.0)
MCV: 78.7 fL — ABNORMAL LOW (ref 80.0–100.0)
Monocytes Absolute: 1.5 10*3/uL — ABNORMAL HIGH (ref 0.1–1.0)
Monocytes Relative: 16 %
Neutro Abs: 5.5 10*3/uL (ref 1.7–7.7)
Neutrophils Relative %: 61 %
Platelets: 368 10*3/uL (ref 150–400)
RBC: 3.9 MIL/uL (ref 3.87–5.11)
RDW: 13.5 % (ref 11.5–15.5)
WBC: 8.9 10*3/uL (ref 4.0–10.5)
nRBC: 0 % (ref 0.0–0.2)

## 2019-09-01 LAB — IRON AND TIBC
Iron: 18 ug/dL — ABNORMAL LOW (ref 28–170)
Saturation Ratios: 5 % — ABNORMAL LOW (ref 10.4–31.8)
TIBC: 402 ug/dL (ref 250–450)
UIBC: 384 ug/dL

## 2019-09-01 LAB — FERRITIN: Ferritin: 14 ng/mL (ref 11–307)

## 2019-09-02 ENCOUNTER — Other Ambulatory Visit: Payer: Self-pay | Admitting: Oncology

## 2019-09-02 NOTE — Progress Notes (Signed)
Pt was told about results and she is agreeable to iron treatments. Heather called and made schedule for them.

## 2019-09-03 ENCOUNTER — Other Ambulatory Visit: Payer: Self-pay

## 2019-09-03 ENCOUNTER — Inpatient Hospital Stay: Payer: Medicare Other

## 2019-09-03 VITALS — BP 158/61 | HR 67 | Temp 97.0°F | Resp 20

## 2019-09-03 DIAGNOSIS — D509 Iron deficiency anemia, unspecified: Secondary | ICD-10-CM | POA: Diagnosis not present

## 2019-09-03 DIAGNOSIS — D500 Iron deficiency anemia secondary to blood loss (chronic): Secondary | ICD-10-CM

## 2019-09-03 MED ORDER — SODIUM CHLORIDE 0.9 % IV SOLN
510.0000 mg | Freq: Once | INTRAVENOUS | Status: AC
  Administered 2019-09-03: 510 mg via INTRAVENOUS
  Filled 2019-09-03: qty 510

## 2019-09-03 MED ORDER — SODIUM CHLORIDE 0.9 % IV SOLN
Freq: Once | INTRAVENOUS | Status: AC
  Filled 2019-09-03: qty 250

## 2019-09-10 ENCOUNTER — Other Ambulatory Visit: Payer: Self-pay

## 2019-09-10 ENCOUNTER — Inpatient Hospital Stay: Payer: Medicare Other | Attending: Oncology

## 2019-09-10 VITALS — BP 156/60 | HR 69 | Temp 97.2°F | Resp 18

## 2019-09-10 DIAGNOSIS — D509 Iron deficiency anemia, unspecified: Secondary | ICD-10-CM | POA: Diagnosis present

## 2019-09-10 DIAGNOSIS — D500 Iron deficiency anemia secondary to blood loss (chronic): Secondary | ICD-10-CM

## 2019-09-10 MED ORDER — SODIUM CHLORIDE 0.9 % IV SOLN
Freq: Once | INTRAVENOUS | Status: AC
  Filled 2019-09-10: qty 250

## 2019-09-10 MED ORDER — SODIUM CHLORIDE 0.9 % IV SOLN
510.0000 mg | Freq: Once | INTRAVENOUS | Status: AC
  Administered 2019-09-10: 510 mg via INTRAVENOUS
  Filled 2019-09-10: qty 510

## 2019-10-07 ENCOUNTER — Ambulatory Visit (INDEPENDENT_AMBULATORY_CARE_PROVIDER_SITE_OTHER): Payer: Medicare Other | Admitting: Dermatology

## 2019-10-07 ENCOUNTER — Other Ambulatory Visit: Payer: Self-pay

## 2019-10-07 ENCOUNTER — Encounter: Payer: Self-pay | Admitting: Dermatology

## 2019-10-07 DIAGNOSIS — D1801 Hemangioma of skin and subcutaneous tissue: Secondary | ICD-10-CM | POA: Diagnosis not present

## 2019-10-07 DIAGNOSIS — Z85828 Personal history of other malignant neoplasm of skin: Secondary | ICD-10-CM

## 2019-10-07 DIAGNOSIS — L820 Inflamed seborrheic keratosis: Secondary | ICD-10-CM

## 2019-10-07 DIAGNOSIS — T148XXA Other injury of unspecified body region, initial encounter: Secondary | ICD-10-CM

## 2019-10-07 DIAGNOSIS — L578 Other skin changes due to chronic exposure to nonionizing radiation: Secondary | ICD-10-CM | POA: Diagnosis not present

## 2019-10-07 MED ORDER — HYDROCORTISONE 2.5 % EX CREA: TOPICAL_CREAM | Freq: Two times a day (BID) | CUTANEOUS | 3 refills | Status: DC | PRN

## 2019-10-07 NOTE — Patient Instructions (Addendum)
Recommend daily broad spectrum sunscreen SPF 30+ to sun-exposed areas, reapply every 2 hours as needed. Call for new or changing lesions.  Cryotherapy Aftercare  . Wash gently with soap and water everyday.   . Apply Vaseline and Band-Aid daily until healed.  Prior to procedure, discussed risks of blister formation, small wound, skin dyspigmentation, or rare scar following cryotherapy.  Liquid nitrogen was applied for 10-12 seconds to the skin lesion and the expected blistering or scabbing reaction explained. Do not pick at the area. Patient reminded to expect hypopigmented scars from the procedure. Return if lesion fails to fully resolve.  

## 2019-10-07 NOTE — Progress Notes (Signed)
   Follow-Up Visit   Subjective  [name] [name] is a 86 y.o. female who presents for the following: Area of concern (Area on back and on the thighs).  Can't really see areas- thinks she has ticks.  She also gets "raw" feeling on her left cheek when she touches it.  She does not want a TBSE today.  Pateint has a h/x of skin cancer on nose, treated by Dr. Valere Dross at [facility].  The following portions of the chart were reviewed this encounter and updated as appropriate:      Review of Systems:  No other skin or systemic complaints except as noted in HPI or Assessment and Plan.  Objective  Well appearing patient in no apparent distress; mood and affect are within normal limits.  A focused examination was performed including Back and Left Groin and face. Relevant physical exam findings are noted in the Assessment and Plan.  Objective  Left cheek, Spinal Mid Back: Waxy tan patch with erythema L mid cheek Keratotic papule mid back  Objective  Left Spinal Upper Back: Crusted excoriated papule- itchy, thinks she scraped off a tick.  Objective  B/L Thigh: Red papules.    Assessment & Plan  Inflamed seborrheic keratosis (2) Spinal Mid Back; Left cheek  Cryotherapy today x 1 to spinal mid back Prior to procedure, discussed risks of blister formation, small wound, skin dyspigmentation, or rare scar following cryotherapy.   Start HC 2.5% cream to L cheek qd/bid prn until improved.     Destruction of lesion - Spinal Mid Back  Destruction method: cryotherapy   Informed consent: discussed and consent obtained   Lesion destroyed using liquid nitrogen: Yes   Region frozen until ice ball extended beyond lesion: Yes   Outcome: patient tolerated procedure well with no complications   Post-procedure details: wound care instructions given    hydrocortisone 2.5 % cream - Left cheek  Excoriation Left Spinal Upper Back  Can use HC 2.5% cream qd/bid as needed for itch  Hemangioma of  skin B/L Thigh  Benign, observe.  Reassurance.   Actinic Damage - diffuse scaly erythematous macules with underlying dyspigmentation - Recommend daily broad spectrum sunscreen SPF 30+ to sun-exposed areas, reapply every 2 hours as needed.  - Call for new or changing lesions.  History of Skin Cancer   Clear. Observe for recurrence.  Nose tx'd by Dr. Valere Dross at [facility] Call clinic for new or changing lesions.   Recommend regular skin exams, daily broad-spectrum spf 30+ sunscreen use, and photoprotection.     Return in about 1 year (around 10/06/2020) for TBSE.  I, Donzetta Kohut, CMA, am acting as scribe for Brendolyn Patty, MD .  Documentation: I have reviewed the above documentation for accuracy and completeness, and I agree with the above.  Brendolyn Patty MD

## 2019-11-04 ENCOUNTER — Emergency Department: Payer: Medicare Other

## 2019-11-04 ENCOUNTER — Inpatient Hospital Stay
Admission: EM | Admit: 2019-11-04 | Discharge: 2019-11-07 | DRG: 191 | Disposition: A | Discharge status: Home / Self Care | Payer: Medicare Other | Attending: Internal Medicine | Admitting: Internal Medicine

## 2019-11-04 DIAGNOSIS — I10 Essential (primary) hypertension: Secondary | ICD-10-CM | POA: Diagnosis present

## 2019-11-04 DIAGNOSIS — E785 Hyperlipidemia, unspecified: Secondary | ICD-10-CM | POA: Diagnosis present

## 2019-11-04 DIAGNOSIS — Z886 Allergy status to analgesic agent status: Secondary | ICD-10-CM

## 2019-11-04 DIAGNOSIS — E861 Hypovolemia: Secondary | ICD-10-CM | POA: Diagnosis present

## 2019-11-04 DIAGNOSIS — Z885 Allergy status to narcotic agent status: Secondary | ICD-10-CM | POA: Diagnosis not present

## 2019-11-04 DIAGNOSIS — Z85828 Personal history of other malignant neoplasm of skin: Secondary | ICD-10-CM

## 2019-11-04 DIAGNOSIS — R531 Weakness: Secondary | ICD-10-CM

## 2019-11-04 DIAGNOSIS — Z91048 Other nonmedicinal substance allergy status: Secondary | ICD-10-CM

## 2019-11-04 DIAGNOSIS — Z9049 Acquired absence of other specified parts of digestive tract: Secondary | ICD-10-CM

## 2019-11-04 DIAGNOSIS — Z7951 Long term (current) use of inhaled steroids: Secondary | ICD-10-CM | POA: Diagnosis not present

## 2019-11-04 DIAGNOSIS — K5520 Angiodysplasia of colon without hemorrhage: Secondary | ICD-10-CM | POA: Diagnosis present

## 2019-11-04 DIAGNOSIS — J441 Chronic obstructive pulmonary disease with (acute) exacerbation: Secondary | ICD-10-CM | POA: Diagnosis present

## 2019-11-04 DIAGNOSIS — K219 Gastro-esophageal reflux disease without esophagitis: Secondary | ICD-10-CM | POA: Diagnosis present

## 2019-11-04 DIAGNOSIS — H3530 Unspecified macular degeneration: Secondary | ICD-10-CM | POA: Diagnosis present

## 2019-11-04 DIAGNOSIS — D500 Iron deficiency anemia secondary to blood loss (chronic): Secondary | ICD-10-CM

## 2019-11-04 DIAGNOSIS — Z20822 Contact with and (suspected) exposure to covid-19: Secondary | ICD-10-CM | POA: Diagnosis present

## 2019-11-04 DIAGNOSIS — T502X5A Adverse effect of carbonic-anhydrase inhibitors, benzothiadiazides and other diuretics, initial encounter: Secondary | ICD-10-CM | POA: Diagnosis present

## 2019-11-04 DIAGNOSIS — Z23 Encounter for immunization: Secondary | ICD-10-CM | POA: Diagnosis not present

## 2019-11-04 DIAGNOSIS — Z888 Allergy status to other drugs, medicaments and biological substances status: Secondary | ICD-10-CM

## 2019-11-04 DIAGNOSIS — E871 Hypo-osmolality and hyponatremia: Secondary | ICD-10-CM | POA: Diagnosis present

## 2019-11-04 DIAGNOSIS — H409 Unspecified glaucoma: Secondary | ICD-10-CM | POA: Diagnosis present

## 2019-11-04 DIAGNOSIS — G609 Hereditary and idiopathic neuropathy, unspecified: Secondary | ICD-10-CM | POA: Diagnosis present

## 2019-11-04 DIAGNOSIS — Z87891 Personal history of nicotine dependence: Secondary | ICD-10-CM

## 2019-11-04 DIAGNOSIS — M797 Fibromyalgia: Secondary | ICD-10-CM | POA: Diagnosis present

## 2019-11-04 DIAGNOSIS — E876 Hypokalemia: Secondary | ICD-10-CM | POA: Diagnosis present

## 2019-11-04 DIAGNOSIS — Z79899 Other long term (current) drug therapy: Secondary | ICD-10-CM

## 2019-11-04 DIAGNOSIS — Z66 Do not resuscitate: Secondary | ICD-10-CM | POA: Diagnosis present

## 2019-11-04 LAB — CBC WITH DIFFERENTIAL/PLATELET
Abs Immature Granulocytes: 0.02 10*3/uL (ref 0.00–0.07)
Basophils Absolute: 0.1 10*3/uL (ref 0.0–0.1)
Basophils Relative: 1 %
Eosinophils Absolute: 0.8 10*3/uL — ABNORMAL HIGH (ref 0.0–0.5)
Eosinophils Relative: 9 %
HCT: 29.4 % — ABNORMAL LOW (ref 36.0–46.0)
Hemoglobin: 9.7 g/dL — ABNORMAL LOW (ref 12.0–15.0)
Immature Granulocytes: 0 %
Lymphocytes Relative: 15 %
Lymphs Abs: 1.3 10*3/uL (ref 0.7–4.0)
MCH: 27.8 pg (ref 26.0–34.0)
MCHC: 33 g/dL (ref 30.0–36.0)
MCV: 84.2 fL (ref 80.0–100.0)
Monocytes Absolute: 1.2 10*3/uL — ABNORMAL HIGH (ref 0.1–1.0)
Monocytes Relative: 14 %
Neutro Abs: 5.4 10*3/uL (ref 1.7–7.7)
Neutrophils Relative %: 61 %
Platelets: 315 10*3/uL (ref 150–400)
RBC: 3.49 MIL/uL — ABNORMAL LOW (ref 3.87–5.11)
RDW: 18.5 % — ABNORMAL HIGH (ref 11.5–15.5)
WBC: 8.8 10*3/uL (ref 4.0–10.5)
nRBC: 0 % (ref 0.0–0.2)

## 2019-11-04 LAB — COMPREHENSIVE METABOLIC PANEL
ALT: 14 U/L (ref 0–44)
AST: 18 U/L (ref 15–41)
Albumin: 3.7 g/dL (ref 3.5–5.0)
Alkaline Phosphatase: 65 U/L (ref 38–126)
Anion gap: 11 (ref 5–15)
BUN: 13 mg/dL (ref 8–23)
CO2: 28 mmol/L (ref 22–32)
Calcium: 8.7 mg/dL — ABNORMAL LOW (ref 8.9–10.3)
Chloride: 90 mmol/L — ABNORMAL LOW (ref 98–111)
Creatinine, Ser: 0.56 mg/dL (ref 0.44–1.00)
GFR, Estimated: >60 mL/min (ref >60)
Glucose, Bld: 94 mg/dL (ref 70–99)
Potassium: 3.5 mmol/L (ref 3.5–5.1)
Sodium: 129 mmol/L — ABNORMAL LOW (ref 135–145)
Total Bilirubin: 0.7 mg/dL (ref 0.3–1.2)
Total Protein: 7.3 g/dL (ref 6.5–8.1)

## 2019-11-04 LAB — BRAIN NATRIURETIC PEPTIDE: B Natriuretic Peptide: 27.1 pg/mL (ref 0.0–100.0)

## 2019-11-04 LAB — TROPONIN I (HIGH SENSITIVITY): Troponin I (High Sensitivity): 9 ng/L (ref <18)

## 2019-11-04 MED ORDER — METHYLPREDNISOLONE SODIUM SUCC 125 MG IJ SOLR
125.0000 mg | Freq: Once | INTRAMUSCULAR | Status: AC
  Administered 2019-11-04: 125 mg via INTRAVENOUS
  Filled 2019-11-04: qty 2

## 2019-11-04 MED ORDER — SODIUM CHLORIDE 0.9 % IV SOLN: Freq: Once | INTRAVENOUS | Status: AC

## 2019-11-04 MED ORDER — IPRATROPIUM-ALBUTEROL 0.5-2.5 (3) MG/3ML IN SOLN
3.0000 mL | Freq: Once | RESPIRATORY_TRACT | Status: AC
  Administered 2019-11-04: 3 mL via RESPIRATORY_TRACT
  Filled 2019-11-04: qty 6

## 2019-11-04 MED ORDER — ALBUTEROL SULFATE (2.5 MG/3ML) 0.083% IN NEBU
2.5000 mg | INHALATION_SOLUTION | RESPIRATORY_TRACT | Status: DC | PRN
  Administered 2019-11-05: 2.5 mg via RESPIRATORY_TRACT
  Filled 2019-11-04 (×2): qty 3

## 2019-11-04 MED ORDER — PREDNISONE 20 MG PO TABS
40.0000 mg | ORAL_TABLET | Freq: Every day | ORAL | Status: DC
  Administered 2019-11-06 – 2019-11-07 (×2): 40 mg via ORAL
  Filled 2019-11-04 (×2): qty 2

## 2019-11-04 MED ORDER — IPRATROPIUM-ALBUTEROL 0.5-2.5 (3) MG/3ML IN SOLN
3.0000 mL | Freq: Once | RESPIRATORY_TRACT | Status: AC
  Administered 2019-11-04: 3 mL via RESPIRATORY_TRACT

## 2019-11-04 MED ORDER — IPRATROPIUM-ALBUTEROL 0.5-2.5 (3) MG/3ML IN SOLN
3.0000 mL | Freq: Four times a day (QID) | RESPIRATORY_TRACT | Status: DC
  Administered 2019-11-05 – 2019-11-06 (×7): 3 mL via RESPIRATORY_TRACT
  Filled 2019-11-04 (×6): qty 3

## 2019-11-04 MED ORDER — ENOXAPARIN SODIUM 40 MG/0.4ML ~~LOC~~ SOLN
40.0000 mg | SUBCUTANEOUS | Status: DC
  Administered 2019-11-06: 23:00:00 40 mg via SUBCUTANEOUS
  Filled 2019-11-04: qty 0.4

## 2019-11-04 MED ORDER — SODIUM CHLORIDE 0.9 % IV SOLN
2.0000 g | Freq: Once | INTRAVENOUS | Status: AC
  Administered 2019-11-04: 2 g via INTRAVENOUS
  Filled 2019-11-04: qty 20

## 2019-11-04 MED ORDER — SODIUM CHLORIDE 0.9 % IV SOLN
500.0000 mg | Freq: Once | INTRAVENOUS | Status: AC
  Administered 2019-11-04: 500 mg via INTRAVENOUS
  Filled 2019-11-04: qty 500

## 2019-11-04 MED ORDER — METHYLPREDNISOLONE SODIUM SUCC 40 MG IJ SOLR
40.0000 mg | Freq: Four times a day (QID) | INTRAMUSCULAR | Status: AC
  Administered 2019-11-05 (×4): 40 mg via INTRAVENOUS
  Filled 2019-11-04 (×4): qty 1

## 2019-11-04 MED ORDER — ALBUTEROL SULFATE (2.5 MG/3ML) 0.083% IN NEBU
5.0000 mg | INHALATION_SOLUTION | Freq: Once | RESPIRATORY_TRACT | Status: AC
  Administered 2019-11-04: 5 mg via RESPIRATORY_TRACT
  Filled 2019-11-04: qty 6

## 2019-11-04 NOTE — ED Triage Notes (Signed)
Patient reports she was diagnosed with pneumonia on Oct 11th, patient reports she has finished antibiotics and steroids. Patient reports increasing shortness of breath and weakness for 1 day. Patient has dry cough and wheezing. Patient A&OX3.

## 2019-11-04 NOTE — ED Notes (Signed)
Pt with audible expiratory wheezing bilaterally. Pt reports increased work of breathing over past couple days. SpO2 90-92% RA, increases to 98-100% on 2L [place]  Pt a&o x4

## 2019-11-04 NOTE — H&P (Signed)
History and Physical    [name] TTS:[id number] DOB: [birth year] DOA: 11/04/2019  PCP: Kirk Ruths, MD   Patient coming from: Home  I have personally briefly reviewed patient's old medical records in [place]  Chief Complaint: Shortness of breath  HPI: [name] [name] is a 86 y.o. female with medical history significant for chronic anemia secondary to chronic GI bleed from angiodysplasia, COPD, ex-smoker x30 years, HTN and macular degeneration, recently treated on 10/11 for pneumonia with antibiotics and steroids from which she mostly but not fully recovered, who presented to the emergency room with a 2-day history of shortness of breath and wheezing and dry cough not responding to home bronchodilator treatments.  She admits to chills but denies fever.  Denies chest pain, lower extremity pain or swelling..  Denies abdominal pain nausea vomiting or diarrhea.  Patient states that at baseline she lives independently and is very active and loves to garden however over the past 4 weeks she has no energy, feels very weak and has had very little appetite. ED Course: Vitals in the ER were within normal limits and she was saturating at 95% on room air.  Blood work notable for hemoglobin of 9.7, normal WBC of 8.8.  CMP with sodium of 129 and otherwise unremarkable.  BNP 27, troponin 9. EKG as reviewed by me : Normal sinus rhythm at 81 with nonspecific ST-T wave changes. Patient treated with DuoNeb as well as IV Solu-Medrol.  Hospitalist consulted for admission.  Review of Systems: As per HPI otherwise all other systems on review of systems negative.    Past Medical History:  Diagnosis Date  . Actinic keratosis   . Anemia   . COPD (chronic obstructive pulmonary disease) ([place])   . Diverticulitis   . GERD (gastroesophageal reflux disease)   . Glaucoma   . HLD (hyperlipidemia)   . Hypertension   . Iron deficiency   . Rectal bleeding   . Skin cancer    Nose, txted  by Dr. Valere Dross at [facility]    Past Surgical History:  Procedure Laterality Date  . ABDOMINAL HYSTERECTOMY    . APPENDECTOMY    . COLONOSCOPY WITH PROPOFOL N/A 04/14/2016   Procedure: COLONOSCOPY WITH PROPOFOL;  Surgeon: Lucilla Lame, MD;  Location: [facility] ENDOSCOPY;  Service: Endoscopy;  Laterality: N/A;  . ENTEROSCOPY N/A 05/10/2016   Procedure: Push ENTEROSCOPY with pediatric colonoscope;  Surgeon: [name], MD;  Location: [facility] ENDOSCOPY;  Service: Endoscopy;  Laterality: N/A;  . ENTEROSCOPY N/A 05/30/2016   Procedure: push ENTEROSCOPY;  Surgeon: [name], MD;  Location: [facility] ENDOSCOPY;  Service: Endoscopy;  Laterality: N/A;  . ENTEROSCOPY N/A 07/05/2017   Procedure: ENTEROSCOPY;  Surgeon: [name], MD;  Location: [facility] ENDOSCOPY;  Service: Gastroenterology;  Laterality: N/A;  . ENTEROSCOPY N/A 06/21/2018   Procedure: ENTEROSCOPY;  Surgeon: [name], MD;  Location: [facility] ENDOSCOPY;  Service: Gastroenterology;  Laterality: N/A;  . ESOPHAGOGASTRODUODENOSCOPY (EGD) WITH PROPOFOL N/A 08/12/2015   Procedure: ESOPHAGOGASTRODUODENOSCOPY (EGD) WITH PROPOFOL;  Surgeon: Lollie Sails, MD;  Location: [facility] ENDOSCOPY;  Service: Endoscopy;  Laterality: N/A;  . ESOPHAGOGASTRODUODENOSCOPY (EGD) WITH PROPOFOL N/A 04/03/2016   Procedure: ESOPHAGOGASTRODUODENOSCOPY (EGD) WITH PROPOFOL;  Surgeon: Lucilla Lame, MD;  Location: [facility] ENDOSCOPY;  Service: Endoscopy;  Laterality: N/A;  . GIVENS CAPSULE STUDY N/A 04/28/2016   Procedure: GIVENS CAPSULE STUDY;  Surgeon: [name], MD;  Location: [facility] ENDOSCOPY;  Service: Endoscopy;  Laterality: N/A;  . GIVENS CAPSULE STUDY N/A 06/21/2017   Procedure: GIVENS CAPSULE  STUDY;  Surgeon: [name], MD;  Location: [facility] ENDOSCOPY;  Service: Gastroenterology;  Laterality: N/A;  . JOINT REPLACEMENT       reports that she has quit smoking. Her smoking use included cigarettes. She has never used smokeless tobacco. She reports current alcohol use of about 1.0 standard drink of  alcohol per week. She reports that she does not use drugs.  Allergies  Allergen Reactions  . Nsaids Hives  . Codeine Nausea And Vomiting  . Dextrans Hives  . Fentanyl Itching  . Lipitor [Atorvastatin] Other (See Comments)    Reaction: Muscle pain  . Lovastatin   . Mobic [Meloxicam] Other (See Comments)    Reaction: Mouth and tongue ulcers  . Niacin And Related Itching  . Other   . Polysaccharide K Hives  . Pravachol [Pravastatin Sodium] Other (See Comments)    Reaction: Muscle pain  . Statins Other (See Comments)  . Tolmetin Hives  . Voltaren [Diclofenac Sodium] Other (See Comments)    Reaction: Mouth and tongue ulcers  . Zetia [Ezetimibe] Other (See Comments)    Reaction: Leg pain  . Niacin Itching    Family History  Problem Relation Age of Onset  . CAD Mother   . CAD Father   . CAD Brother       Prior to Admission medications   Medication Sig Start Date End Date Taking? Authorizing Provider  acetaminophen (TYLENOL) 500 MG tablet Take 500-1,000 mg by mouth every 6 (six) hours as needed for mild pain, moderate pain or fever.    [provider]  albuterol (PROVENTIL HFA;VENTOLIN HFA) 108 (90 Base) MCG/ACT inhaler Inhale 2 puffs into the lungs every 4 (four) hours as needed for wheezing or shortness of breath.    [provider]  chlorthalidone (HYGROTON) 25 MG tablet Take 12.5 mg by mouth daily.  01/18/16   [provider]  Coenzyme Q10 10 MG capsule Take 10 mg by mouth daily.     [provider]  Cyanocobalamin (B-12) 1000 MCG CAPS Take 2,000 Units by mouth daily.     [provider]  cycloSPORINE (RESTASIS) 0.05 % ophthalmic emulsion Place 1 drop into both eyes 2 (two) times daily.    [provider]  escitalopram (LEXAPRO) 20 MG tablet Take 20 mg by mouth daily.    [provider]  fluticasone (FLOVENT HFA) 110 MCG/ACT inhaler Inhale 2 puffs into the lungs 2 (two) times daily.    [provider]    gabapentin (NEURONTIN) 300 MG capsule Take 300 mg by mouth 2 (two) times daily.    [provider]  guaiFENesin (MUCINEX) 600 MG 12 hr tablet Take 600 mg by mouth 2 (two) times daily as needed for cough or to loosen phlegm.     [provider]  hydrocortisone 2.5 % cream Apply topically 2 (two) times daily as needed (Rash). 10/07/19   Brendolyn Patty, MD  losartan (COZAAR) 50 MG tablet Take 50 mg by mouth daily.    [provider]  mometasone (ELOCON) 0.1 % lotion Apply 1 application topically daily as needed (skin irritation).     [provider]  Multiple Vitamins-Minerals (OCUVITE PRESERVISION PO) Take 1 tablet by mouth 2 (two) times daily.    [provider]  potassium chloride (K-DUR,KLOR-CON) 10 MEQ tablet Take 10 mEq by mouth 3 (three) times daily.     [provider]  RABEprazole (ACIPHEX) 20 MG tablet Take 20 mg by mouth 2 (two) times daily.  [provider]  timolol (TIMOPTIC-XR) 0.5 % ophthalmic gel-forming Place 1 drop into both eyes daily.    [provider]  tiZANidine (ZANAFLEX) 4 MG tablet Take 2-4 mg by mouth 2 (two) times daily as needed for muscle spasms.    [provider]  valACYclovir (VALTREX) 1000 MG tablet Take 1,000 mg by mouth daily as needed (breakouts).     [provider]    Physical Exam: Vitals:   11/04/19 1643 11/04/19 1644 11/04/19 2218  BP: (!) 146/54  (!) 162/76  Pulse: 82  73  Resp: 16  16  Temp: 98.6 F (37 C)    TempSrc: Oral    SpO2: 95%  100%  Weight:  85 kg   Height:  5\' 8"  (1.727 m)      Vitals:   11/04/19 1643 11/04/19 1644 11/04/19 2218  BP: (!) 146/54  (!) 162/76  Pulse: 82  73  Resp: 16  16  Temp: 98.6 F (37 C)    TempSrc: Oral    SpO2: 95%  100%  Weight:  85 kg   Height:  5\' 8"  (1.727 m)       Constitutional: Alert and oriented x 3 .  Conversational dyspnea with audible wheezing HEENT:      Head: Normocephalic and atraumatic.          Eyes: PERLA, EOMI, Conjunctivae are normal. Sclera is non-icteric.       Mouth/Throat: Mucous membranes are moist.       Neck: Supple with no signs of meningismus. Cardiovascular: Regular rate and rhythm. No murmurs, gallops, or rubs. 2+ symmetrical distal pulses are present . No JVD. No LE edema Respiratory: Respiratory effort increased.Lungs sounds clear diminished bilaterally.  Bilateral wheezes and rhonchi Gastrointestinal: Soft, non tender, and non distended with positive bowel sounds. No rebound or guarding. Genitourinary: No CVA tenderness. Musculoskeletal: Nontender with normal range of motion in all extremities. No cyanosis, or erythema of extremities. Neurologic:  Face is symmetric. Moving all extremities. No gross focal neurologic deficits . Skin: Skin is warm, dry.  No rash or ulcers Psychiatric: Mood and affect are appropriate   Labs on Admission: I have personally reviewed following labs and imaging studies  CBC: Recent Labs  Lab 11/04/19 2249  WBC 8.8  NEUTROABS 5.4  HGB 9.7*  HCT 29.4*  MCV 84.2  PLT 417   Basic Metabolic Panel: Recent Labs  Lab 11/04/19 2203  NA 129*  K 3.5  CL 90*  CO2 28  GLUCOSE 94  BUN 13  CREATININE 0.56  CALCIUM 8.7*   GFR: Estimated Creatinine Clearance: 51.2 mL/min (by C-G formula based on SCr of 0.56 mg/dL). Liver Function Tests: Recent Labs  Lab 11/04/19 2203  AST 18  ALT 14  ALKPHOS 65  BILITOT 0.7  PROT 7.3  ALBUMIN 3.7   No results for input(s): LIPASE, AMYLASE in the last 168 hours. No results for input(s): AMMONIA in the last 168 hours. Coagulation Profile: No results for input(s): INR, PROTIME in the last 168 hours. Cardiac Enzymes: No results for input(s): CKTOTAL, CKMB, CKMBINDEX, TROPONINI in the last 168 hours. BNP (last 3 results) No results for input(s): PROBNP in the last 8760 hours. HbA1C: No results for input(s): HGBA1C in the last 72 hours. CBG: No results for input(s): GLUCAP in the last 168  hours. Lipid Profile: No results for input(s): CHOL, HDL, LDLCALC, TRIG, CHOLHDL, LDLDIRECT in the last 72 hours. Thyroid Function Tests: No results for input(s): TSH, T4TOTAL, FREET4,  T3FREE, THYROIDAB in the last 72 hours. Anemia Panel: No results for input(s): VITAMINB12, FOLATE, FERRITIN, TIBC, IRON, RETICCTPCT in the last 72 hours. Urine analysis:    Component Value Date/Time   COLORURINE STRAW (A) 06/26/2017 1757   APPEARANCEUR CLEAR (A) 06/26/2017 1757   LABSPEC 1.010 06/26/2017 1757   PHURINE 6.0 06/26/2017 1757   GLUCOSEU NEGATIVE 06/26/2017 1757   HGBUR NEGATIVE 06/26/2017 1757   BILIRUBINUR NEGATIVE 06/26/2017 1757   KETONESUR NEGATIVE 06/26/2017 1757   PROTEINUR NEGATIVE 06/26/2017 1757   NITRITE NEGATIVE 06/26/2017 1757   LEUKOCYTESUR NEGATIVE 06/26/2017 1757    Radiological Exams on Admission: DG Chest 2 View  Result Date: 11/04/2019 CLINICAL DATA:  86 year old female with shortness of breath. EXAM: CHEST - 2 VIEW COMPARISON:  Chest radiograph dated 10/18/2015. FINDINGS: Probable trace bilateral pleural effusions. Minimal left lung base atelectasis or infiltrate. There is background of mild chronic interstitial coarsening and bronchitic changes. No pneumothorax. The cardiac silhouette is within limits. Atherosclerotic calcification of the aorta. Osteopenia with degenerative changes of the spine. No acute osseous pathology. IMPRESSION: 1. Probable trace bilateral pleural effusions. 2. Minimal left lung base atelectasis or infiltrate. Electronically Signed   By: Anner Crete M.D.   On: 11/04/2019 17:28     Assessment/Plan 86 year old female with history of chronic anemia secondary to chronic GI bleed from angiodysplasia, COPD, HTN and macular degeneration, recently treated on 10/11 for pneumonia with antibiotics and steroids from which she mostly but not fully recovered, presenting with a 2-day history of shortness of breath and wheezing and dry cough not responding to  home bronchodilator treatments    COPD with acute exacerbation (HCC) -Scheduled and as needed nebulized bronchodilator treatments -IV steroids -IV Rocephin -Supplemental oxygen as needed  Generalized weakness -Likely multifactorial related to weeks of respiratory tract illness, chronic anemia as well as hyponatremia    Iron deficiency anemia due to chronic blood loss   Angiodysplasia of intestinal tract -Hemoglobin 9.7, down from 10.2 two months prior and 12 three months prior -Continue to monitor hemoglobin    Hyponatremia -Suspect hypovolemic due to decreased oral intake since onset of acute illness -IV hydration with normal saline and monitor serum sodium    HTN (hypertension), benign -Continue home meds    ARMD (age related macular degeneration) -Fall precautions on increase nursing assistance as needed  Fibromyalgia -Continue home gabapentin and tizanidine    DVT prophylaxis: Lovenox  Code Status: DNR Family Communication:  none  Disposition Plan: Back to previous home environment Consults called: none  Status:At the time of admission, it appears that the appropriate admission status for this patient is INPATIENT. This is judged to be reasonable and necessary in order to provide the required intensity of service to ensure the patient's safety given the presenting symptoms, physical exam findings, and initial radiographic and laboratory data in the context of their  Comorbid conditions.   Patient requires inpatient status due to high intensity of service, high risk for further deterioration and high frequency of surveillance required.   I certify that at the point of admission it is my clinical judgment that the patient will require inpatient hospital care spanning beyond [place] MD [facility]     11/04/2019, 11:56 PM

## 2019-11-04 NOTE — ED Provider Notes (Signed)
[facility] Emergency Department Provider Note  ____________________________________________   First MD Initiated Contact with Patient 11/04/19 2122     (approximate)  I have reviewed the triage vital signs and the nursing notes.   HISTORY  Chief Complaint Shortness of Breath    HPI [name] [name] is a 86 y.o. female  With h/o COPD, HTN, HLD, here with cough, SOB. Pt states that earlier this month, she was tx for pneumonia with abx and steroids. She felt somewhat better after tx but never fully recovered. Over the past 2 days, she's had return of severe SOB, wheezing, and chills. She feels dyspneic at rest now and has "no energy." She did not eat today 2/2 her weakness and difficulty getting around the house. Reports she's had worsening cough as well with sputum production. Feels worse than her initial pneumonia. No leg swelling. She's been taking her lasix as prescribed. No specific alleviating factors until she was given albuterol in the waiting room, which helped. No aggravating factor other than exertion.        Past Medical History:  Diagnosis Date  . Actinic keratosis   . Anemia   . COPD (chronic obstructive pulmonary disease) ([place])   . Diverticulitis   . GERD (gastroesophageal reflux disease)   . Glaucoma   . HLD (hyperlipidemia)   . Hypertension   . Iron deficiency   . Rectal bleeding   . Skin cancer    Nose, txted by Dr. Valere Dross at [facility]    Patient Active Problem List   Diagnosis Date Noted  . Hyponatremia 11/05/2019  . COPD with acute exacerbation ([place]) 11/04/2019  . Fibromyositis 06/12/2017  . Pain 06/12/2017  . Healthcare maintenance 01/17/2017  . Anemia 05/30/2016  . Angiodysplasia of intestinal tract   . Blood in stool   . Benign neoplasm of ascending colon   . UGIB (upper gastrointestinal bleed)   . GIB (gastrointestinal bleeding) 04/12/2016  . Melena   . Healthcare-associated pertussis 10/10/2015  .  Healthcare-associated pneumonia 10/10/2015  . GI bleed 08/10/2015  . Iron deficiency anemia due to chronic blood loss 08/10/2015  . HTN (hypertension), benign 08/10/2015  . HLD (hyperlipidemia) 08/10/2015  . GERD (gastroesophageal reflux disease) 08/10/2015  . Chronic obstructive pulmonary disease ([place]) 08/10/2015  . Glaucoma suspect 12/26/2012  . Fatigue 02/12/2012  . H/O total hip arthroplasty 01/25/2012  . Total knee replacement status 01/25/2012  . Arthritis of knee 01/18/2012  . ARMD (age related macular degeneration) 11/21/2011  . Foot drop 11/15/2011  . Hallux valgus 11/15/2011  . Hammer toe, acquired 11/15/2011  . Recurrent major depressive disorder, in partial remission ([place]) 07/10/2011  . Degenerative disc disease 04/11/2011  . Fibromyalgia 02/10/2011  . Diverticulosis of colon 07/18/2010  . Gastric ulcer 07/18/2010  . Hereditary and idiopathic peripheral neuropathy 07/18/2010  . Meniere's disease 07/18/2010  . Osteoarthritis, generalized 07/18/2010  . Hemorrhoids 07/18/2010    Past Surgical History:  Procedure Laterality Date  . ABDOMINAL HYSTERECTOMY    . APPENDECTOMY    . COLONOSCOPY WITH PROPOFOL N/A 04/14/2016   Procedure: COLONOSCOPY WITH PROPOFOL;  Surgeon: Lucilla Lame, MD;  Location: [facility] ENDOSCOPY;  Service: Endoscopy;  Laterality: N/A;  . ENTEROSCOPY N/A 05/10/2016   Procedure: Push ENTEROSCOPY with pediatric colonoscope;  Surgeon: [name], MD;  Location: [facility] ENDOSCOPY;  Service: Endoscopy;  Laterality: N/A;  . ENTEROSCOPY N/A 05/30/2016   Procedure: push ENTEROSCOPY;  Surgeon: [name], MD;  Location: [facility] ENDOSCOPY;  Service: Endoscopy;  Laterality: N/A;  .  ENTEROSCOPY N/A 07/05/2017   Procedure: ENTEROSCOPY;  Surgeon: [name], MD;  Location: [facility] ENDOSCOPY;  Service: Gastroenterology;  Laterality: N/A;  . ENTEROSCOPY N/A 06/21/2018   Procedure: ENTEROSCOPY;  Surgeon: [name], MD;  Location[facility] ENDOSCOPY;  Service: Gastroenterology;  Laterality:  N/A;  . ESOPHAGOGASTRODUODENOSCOPY (EGD) WITH PROPOFOL N/A 08/12/2015   Procedure: ESOPHAGOGASTRODUODENOSCOPY (EGD) WITH PROPOFOL;  Surgeon: Lollie Sails, MD;  Location: [facility] ENDOSCOPY;  Service: Endoscopy;  Laterality: N/A;  . ESOPHAGOGASTRODUODENOSCOPY (EGD) WITH PROPOFOL N/A 04/03/2016   Procedure: ESOPHAGOGASTRODUODENOSCOPY (EGD) WITH PROPOFOL;  Surgeon: Lucilla Lame, MD;  Location: [facility] ENDOSCOPY;  Service: Endoscopy;  Laterality: N/A;  . GIVENS CAPSULE STUDY N/A 04/28/2016   Procedure: GIVENS CAPSULE STUDY;  Surgeon: [name], MD;  Location: [facility] ENDOSCOPY;  Service: Endoscopy;  Laterality: N/A;  . GIVENS CAPSULE STUDY N/A 06/21/2017   Procedure: GIVENS CAPSULE STUDY;  Surgeon: [name], MD;  Location: [facility] ENDOSCOPY;  Service: Gastroenterology;  Laterality: N/A;  . JOINT REPLACEMENT      Prior to Admission medications   Medication Sig Start Date End Date Taking? Authorizing Provider  acetaminophen (TYLENOL) 500 MG tablet Take 500-1,000 mg by mouth every 6 (six) hours as needed for mild pain, moderate pain or fever.    [provider]  albuterol (PROVENTIL HFA;VENTOLIN HFA) 108 (90 Base) MCG/ACT inhaler Inhale 2 puffs into the lungs every 4 (four) hours as needed for wheezing or shortness of breath.    [provider]  chlorthalidone (HYGROTON) 25 MG tablet Take 12.5 mg by mouth daily.  01/18/16   [provider]  Coenzyme Q10 10 MG capsule Take 10 mg by mouth daily.     [provider]  Cyanocobalamin (B-12) 1000 MCG CAPS Take 2,000 Units by mouth daily.     [provider]  cycloSPORINE (RESTASIS) 0.05 % ophthalmic emulsion Place 1 drop into both eyes 2 (two) times daily.    [provider]  escitalopram (LEXAPRO) 20 MG tablet Take 20 mg by mouth daily.    [provider]  fluticasone (FLOVENT HFA) 110 MCG/ACT inhaler Inhale 2 puffs into the lungs 2 (two) times daily.    [provider]  gabapentin (NEURONTIN) 300  MG capsule Take 300 mg by mouth 2 (two) times daily.    [provider]  guaiFENesin (MUCINEX) 600 MG 12 hr tablet Take 600 mg by mouth 2 (two) times daily as needed for cough or to loosen phlegm.     [provider]  hydrocortisone 2.5 % cream Apply topically 2 (two) times daily as needed (Rash). 10/07/19   Brendolyn Patty, MD  losartan (COZAAR) 50 MG tablet Take 50 mg by mouth daily.    [provider]  mometasone (ELOCON) 0.1 % lotion Apply 1 application topically daily as needed (skin irritation).     [provider]  Multiple Vitamins-Minerals (OCUVITE PRESERVISION PO) Take 1 tablet by mouth 2 (two) times daily.    [provider]  potassium chloride (K-DUR,KLOR-CON) 10 MEQ tablet Take 10 mEq by mouth 3 (three) times daily.     [provider]  RABEprazole (ACIPHEX) 20 MG tablet Take 20 mg by mouth 2 (two) times daily.    [provider]  timolol (TIMOPTIC-XR) 0.5 % ophthalmic gel-forming Place 1 drop into both eyes daily.    [provider]  tiZANidine (ZANAFLEX) 4 MG tablet Take 2-4 mg by mouth 2 (two) times daily as needed for muscle spasms.    [provider]  valACYclovir (  VALTREX) 1000 MG tablet Take 1,000 mg by mouth daily as needed (breakouts).     [provider]    Allergies Nsaids, Codeine, Dextrans, Fentanyl, Lipitor [atorvastatin], Lovastatin, Mobic [meloxicam], Niacin and related, Other, Polysaccharide k, Pravachol [pravastatin sodium], Statins, Tolmetin, Voltaren [diclofenac sodium], Zetia [ezetimibe], and Niacin  Family History  Problem Relation Age of Onset  . CAD Mother   . CAD Father   . CAD Brother     Social History Social History   Tobacco Use  . Smoking status: Former Smoker    Types: Cigarettes  . Smokeless tobacco: Never Used  . Tobacco comment: over 30 years  Vaping Use  . Vaping Use: Never used  Substance Use Topics  . Alcohol use: Yes    Alcohol/week: 1.0 standard  drink    Types: 1 Glasses of wine per week    Comment: glass wine every 2 months  . Drug use: No    Review of Systems  Review of Systems  Constitutional: Positive for chills and fatigue. Negative for fever.  HENT: Negative for congestion and sore throat.   Eyes: Negative for visual disturbance.  Respiratory: Positive for cough, shortness of breath and wheezing.   Cardiovascular: Negative for chest pain.  Gastrointestinal: Negative for abdominal pain, diarrhea, nausea and vomiting.  Genitourinary: Negative for flank pain.  Musculoskeletal: Negative for back pain and neck pain.  Skin: Negative for rash and wound.  Neurological: Negative for weakness.  All other systems reviewed and are negative.    ____________________________________________  PHYSICAL EXAM:      VITAL SIGNS: ED Triage Vitals  Enc Vitals Group     BP 11/04/19 1643 (!) 146/54     Pulse Rate 11/04/19 1643 82     Resp 11/04/19 1643 16     Temp 11/04/19 1643 98.6 F (37 C)     Temp Source 11/04/19 1643 Oral     SpO2 11/04/19 1643 95 %     Weight 11/04/19 1644 187 lb 6.3 oz (85 kg)     Height 11/04/19 1644 5\' 8"  (1.727 m)     Head Circumference --      Peak Flow --      Pain Score 11/04/19 1644 0     Pain Loc --      Pain Edu? --      Excl. in [place]? --      Physical Exam Vitals and nursing note reviewed.  Constitutional:      General: She is not in acute distress.    Appearance: She is well-developed.  HENT:     Head: Normocephalic and atraumatic.  Eyes:     Conjunctiva/sclera: Conjunctivae normal.  Cardiovascular:     Rate and Rhythm: Normal rate and regular rhythm.     Heart sounds: Normal heart sounds. No murmur heard.  No friction rub.  Pulmonary:     Effort: Pulmonary effort is normal. Tachypnea present. No respiratory distress.     Breath sounds: Examination of the right-upper field reveals wheezing. Examination of the left-upper field reveals wheezing. Examination of the right-middle field  reveals wheezing. Examination of the left-middle field reveals wheezing. Examination of the right-lower field reveals wheezing and rales. Examination of the left-lower field reveals wheezing and rales. Decreased breath sounds, wheezing and rales present.  Abdominal:     General: There is no distension.     Palpations: Abdomen is soft.     Tenderness: There is no abdominal tenderness.  Musculoskeletal:     Cervical  back: Neck supple.  Skin:    General: Skin is warm.     Capillary Refill: Capillary refill takes less than 2 seconds.  Neurological:     Mental Status: She is alert and oriented to person, place, and time.     Motor: No abnormal muscle tone.       ____________________________________________   LABS (all labs ordered are listed, but only abnormal results are displayed)  Labs Reviewed  COMPREHENSIVE METABOLIC PANEL - Abnormal; Notable for the following components:      Result Value   Sodium 129 (*)    Chloride 90 (*)    Calcium 8.7 (*)    All other components within normal limits  CBC WITH DIFFERENTIAL/PLATELET - Abnormal; Notable for the following components:   RBC 3.49 (*)    Hemoglobin 9.7 (*)    HCT 29.4 (*)    RDW 18.5 (*)    Monocytes Absolute 1.2 (*)    Eosinophils Absolute 0.8 (*)    All other components within normal limits  RESPIRATORY PANEL BY RT PCR (FLU A&B, COVID)  CULTURE, BLOOD (ROUTINE X 2)  CULTURE, BLOOD (ROUTINE X 2)  BRAIN NATRIURETIC PEPTIDE  CBC WITH DIFFERENTIAL/PLATELET  HIV ANTIBODY (ROUTINE TESTING W REFLEX)  TROPONIN I (HIGH SENSITIVITY)  TROPONIN I (HIGH SENSITIVITY)    ____________________________________________  EKG: Normal sinus rhythm, VR 81. PR 136, QRS 76, QTc 462. No acute St elevations or depression. No ischemia or infarct. ________________________________________  RADIOLOGY All imaging, including plain films, CT scans, and ultrasounds, independently reviewed by me, and interpretations confirmed via formal radiology  reads.  ED MD interpretation:   CXR: Minimal LLL infiltrate, b/l effusions  Official radiology report(s): DG Chest 2 View  Result Date: 11/04/2019 CLINICAL DATA:  86 year old female with shortness of breath. EXAM: CHEST - 2 VIEW COMPARISON:  Chest radiograph dated 10/18/2015. FINDINGS: Probable trace bilateral pleural effusions. Minimal left lung base atelectasis or infiltrate. There is background of mild chronic interstitial coarsening and bronchitic changes. No pneumothorax. The cardiac silhouette is within limits. Atherosclerotic calcification of the aorta. Osteopenia with degenerative changes of the spine. No acute osseous pathology. IMPRESSION: 1. Probable trace bilateral pleural effusions. 2. Minimal left lung base atelectasis or infiltrate. Electronically Signed   By: Anner Crete M.D.   On: 11/04/2019 17:28    ____________________________________________  PROCEDURES   Procedure(s) performed (including Critical Care):  Procedures  ____________________________________________  INITIAL IMPRESSION / MDM / [place] / ED COURSE  As part of my medical decision making, I reviewed the following data within the [place] notes reviewed and incorporated, Old chart reviewed, Notes from prior ED visits, and [place] Controlled Substance Database       *[name] [name] was evaluated in Emergency Department on 11/05/2019 for the symptoms described in the history of present illness. She was evaluated in the context of the global COVID-19 pandemic, which necessitated consideration that the patient might be at risk for infection with the SARS-CoV-2 virus that causes COVID-19. Institutional protocols and algorithms that pertain to the evaluation of patients at risk for COVID-19 are in a state of rapid change based on information released by regulatory bodies including the CDC and federal and state organizations. These policies and algorithms were followed  during the patient's care in the ED.  Some ED evaluations and interventions may be delayed as a result of limited staffing during the pandemic.*     Medical Decision Making:  86 yo F here with cough,  SOB, wheezing. On arrival, pt in moderate resp distress with tachypnea, speaking in short sentences. Suspect COPD exacerbation vs ongoing CAP, partially treated. She is afebrile, WBC normal, without signs of sepsis but CXR shows LLL infiltrate, b/l effusions. Will treat with Rocephin/Azithro, steroids, nebs and admit. CMP shows hyponatremia - may be related to poor PO intake and hypovolemia, will give cautious fluids. Trop, BNP normal and EKG is nonischemic, doubt ACS, CHF.   ____________________________________________  FINAL CLINICAL IMPRESSION(S) / ED DIAGNOSES  Final diagnoses:  COPD exacerbation ([place])  Hyponatremia     MEDICATIONS GIVEN DURING THIS VISIT:  Medications  enoxaparin (LOVENOX) injection 40 mg (has no administration in time range)  methylPREDNISolone sodium succinate (SOLU-MEDROL) 40 mg/mL injection 40 mg (40 mg Intravenous Given 11/05/19 0009)    Followed by  predniSONE (DELTASONE) tablet 40 mg (has no administration in time range)  ipratropium-albuterol (DUONEB) 0.5-2.5 (3) MG/3ML nebulizer solution 3 mL (has no administration in time range)  albuterol (PROVENTIL) (2.5 MG/3ML) 0.083% nebulizer solution 2.5 mg (has no administration in time range)  0.9 %  sodium chloride infusion (has no administration in time range)  cefTRIAXone (ROCEPHIN) 1 g in sodium chloride 0.9 % 100 mL IVPB (has no administration in time range)  albuterol (PROVENTIL) (2.5 MG/3ML) 0.083% nebulizer solution 5 mg (5 mg Nebulization Given 11/04/19 1652)  ipratropium-albuterol (DUONEB) 0.5-2.5 (3) MG/3ML nebulizer solution 3 mL (3 mLs Nebulization Given 11/04/19 2217)  methylPREDNISolone sodium succinate (SOLU-MEDROL) 125 mg/2 mL injection 125 mg (125 mg Intravenous Given 11/04/19 2153)  cefTRIAXone  (ROCEPHIN) 2 g in sodium chloride 0.9 % 100 mL IVPB (0 g Intravenous Stopped 11/04/19 2243)  azithromycin (ZITHROMAX) 500 mg in sodium chloride 0.9 % 250 mL IVPB ( Intravenous Stopped 11/04/19 2348)  ipratropium-albuterol (DUONEB) 0.5-2.5 (3) MG/3ML nebulizer solution 3 mL (3 mLs Nebulization Given 11/04/19 2217)  0.9 %  sodium chloride infusion ( Intravenous New Bag/Given 11/05/19 0005)     ED Discharge Orders    None       Note:  This document was prepared using Dragon voice recognition software and may include unintentional dictation errors.   Duffy Bruce, MD 11/05/19 [phone]

## 2019-11-05 ENCOUNTER — Other Ambulatory Visit: Payer: Self-pay

## 2019-11-05 ENCOUNTER — Encounter: Payer: Self-pay | Admitting: Internal Medicine

## 2019-11-05 DIAGNOSIS — R531 Weakness: Secondary | ICD-10-CM

## 2019-11-05 DIAGNOSIS — E871 Hypo-osmolality and hyponatremia: Secondary | ICD-10-CM

## 2019-11-05 LAB — TROPONIN I (HIGH SENSITIVITY): Troponin I (High Sensitivity): 10 ng/L (ref <18)

## 2019-11-05 LAB — RESPIRATORY PANEL BY RT PCR (FLU A&B, COVID)
Influenza A by PCR: NEGATIVE
Influenza B by PCR: NEGATIVE
SARS Coronavirus 2 by RT PCR: NEGATIVE

## 2019-11-05 LAB — HIV ANTIBODY (ROUTINE TESTING W REFLEX): HIV Screen 4th Generation wRfx: NONREACTIVE

## 2019-11-05 MED ORDER — SODIUM CHLORIDE 0.9 % IV SOLN: INTRAVENOUS | Status: DC

## 2019-11-05 MED ORDER — INFLUENZA VAC A&B SA ADJ QUAD 0.5 ML IM PRSY
0.5000 mL | PREFILLED_SYRINGE | INTRAMUSCULAR | Status: AC
  Administered 2019-11-07: 10:00:00 0.5 mL via INTRAMUSCULAR
  Filled 2019-11-05: qty 0.5

## 2019-11-05 MED ORDER — TIMOLOL MALEATE 0.5 % OP SOLG: 1.0000 [drp] | Freq: Every day | OPHTHALMIC | Status: DC

## 2019-11-05 MED ORDER — CYCLOSPORINE 0.05 % OP EMUL
1.0000 [drp] | Freq: Two times a day (BID) | OPHTHALMIC | Status: DC
  Administered 2019-11-05 – 2019-11-07 (×5): 1 [drp] via OPHTHALMIC
  Filled 2019-11-05 (×7): qty 1

## 2019-11-05 MED ORDER — FLUTICASONE PROPIONATE HFA 110 MCG/ACT IN AERO
2.0000 | INHALATION_SPRAY | Freq: Two times a day (BID) | RESPIRATORY_TRACT | Status: DC
  Administered 2019-11-05 – 2019-11-07 (×4): 2 via RESPIRATORY_TRACT
  Filled 2019-11-05: qty 12

## 2019-11-05 MED ORDER — GABAPENTIN 300 MG PO CAPS
300.0000 mg | ORAL_CAPSULE | Freq: Two times a day (BID) | ORAL | Status: DC
  Administered 2019-11-05 – 2019-11-07 (×5): 300 mg via ORAL
  Filled 2019-11-05 (×5): qty 1

## 2019-11-05 MED ORDER — SODIUM CHLORIDE 0.9 % IV SOLN
1.0000 g | INTRAVENOUS | Status: DC
  Administered 2019-11-05: 1 g via INTRAVENOUS
  Filled 2019-11-05 (×2): qty 10

## 2019-11-05 MED ORDER — TIZANIDINE HCL 2 MG PO TABS
2.0000 mg | ORAL_TABLET | Freq: Two times a day (BID) | ORAL | Status: DC | PRN
  Filled 2019-11-05: qty 1

## 2019-11-05 NOTE — Evaluation (Signed)
Physical Therapy Evaluation Patient Details Name: [name] [name] MRN: [id number] DOB: [birth year] Today's Date: 11/05/2019   History of Present Illness  Pt is a 86 y.o. female with medical history significant for chronic anemia secondary to chronic GI bleed from angiodysplasia, COPD, ex-smoker x30 years, HTN and macular degeneration, recently treated on 10/11 for pneumonia with antibiotics and steroids from which she mostly but not fully recovered, who presented to the emergency room with a 2-day history of shortness of breath and wheezing and dry cough not responding to home bronchodilator treatments.    Clinical Impression  Patient alert, in bed, very eager for mobility, denied any pain. Reported at baseline she lives at independent living at [place] for ADLs/IADLS uses [facility] as needed. 2 falls in the last 6 months outside. Pt HOH, son assisted with PLOF.  The patient was able to perform bed mobility independently, and sit <> Stand modI. Used UE support for dynamic standing, and pushed IV pole with both hands for ambulation. RW left in room for next ambulation and pt encouraged to utilize rollator upon returning home to improve safety. She was able to ambulate ~134ft with IV pole and CGA, reciprocal gait pattern noted with wide BOS. On 2L via [place] throughout, mild SOB noted after ambulation but pt endorsed feeling significantly better than when she came to ED.  Overall the patient demonstrated mild deficits (see "PT Problem List") from PLOF and would benefit from HHPT to maximize mobility, safety, and independence.      Follow Up Recommendations Home health PT    Equipment Recommendations  None recommended by PT    Recommendations for Other Services       Precautions / Restrictions Precautions Precautions: Fall Restrictions Weight Bearing Restrictions: No      Mobility  Bed Mobility Overal bed mobility: Independent                  Transfers Overall transfer  level: Modified independent               General transfer comment: reliant on UE support but no physical assist neede  Ambulation/Gait Ambulation/Gait assistance: Supervision Gait Distance (Feet): 100 Feet Assistive device: IV Pole (two hands on IV pole)   Gait velocity: decreased   General Gait Details: wide BOS, reciprocal gait pattern noted  Stairs            Wheelchair Mobility    Modified Rankin (Stroke Patients Only)       Balance Overall balance assessment: Needs assistance Sitting-balance support: Feet supported Sitting balance-Leahy Scale: Good       Standing balance-Leahy Scale: Fair Standing balance comment: pt able to statically stand without UE but required bilateral UE support with ambulation                             Pertinent Vitals/Pain Pain Assessment: No/denies pain    Home Living Family/patient expects to be discharged to:: Private residence Living Arrangements: Alone Available Help at Discharge: Family;Available PRN/intermittently Type of Home: Independent living facility Home Access: Level entry     Home Layout: One level Home Equipment: Cane - single point;Walker - 4 wheels      Prior Function Level of Independence: Independent with assistive device(s)         Comments: uses her cane, 2 falls in the last 6 months     Hand Dominance        Extremity/Trunk Assessment  Upper Extremity Assessment Upper Extremity Assessment: Defer to OT evaluation    Lower Extremity Assessment Lower Extremity Assessment: Overall WFL for tasks assessed    Cervical / Trunk Assessment Cervical / Trunk Assessment: Normal  Communication   Communication: No difficulties;HOH  Cognition Arousal/Alertness: Awake/alert Behavior During Therapy: WFL for tasks assessed/performed Overall Cognitive Status: Within Functional Limits for tasks assessed                                        General Comments       Exercises Other Exercises Other Exercises: Pt on room air at end of session with RN consent   Assessment/Plan    PT Assessment Patient needs continued PT services  PT Problem List Decreased strength;Decreased mobility;Decreased activity tolerance;Decreased balance       PT Treatment Interventions DME instruction;Therapeutic exercise;Gait training;Balance training;Stair training;Neuromuscular re-education;Functional mobility training;Therapeutic activities;Patient/family education    PT Goals (Current goals can be found in the Care Plan section)  Acute Rehab PT Goals Patient Stated Goal: to go home PT Goal Formulation: With patient Time For Goal Achievement: 11/19/19 Potential to Achieve Goals: Good    Frequency Min 2X/week   Barriers to discharge        Co-evaluation               AM-PAC PT "6 Clicks" Mobility  Outcome Measure Help needed turning from your back to your side while in a flat bed without using bedrails?: None Help needed moving from lying on your back to sitting on the side of a flat bed without using bedrails?: None Help needed moving to and from a bed to a chair (including a wheelchair)?: None Help needed standing up from a chair using your arms (e.g., wheelchair or bedside chair)?: A Little Help needed to walk in hospital room?: A Little Help needed climbing 3-5 steps with a railing? : A Little 6 Click Score: 21    End of Session Equipment Utilized During Treatment: Gait belt;Oxygen (2L) Activity Tolerance: Patient tolerated treatment well Patient left: in chair;with call bell/phone within reach;with family/visitor present Nurse Communication: Mobility status PT Visit Diagnosis: Other abnormalities of gait and mobility (R26.89);Difficulty in walking, not elsewhere classified (R26.2)    Time: 2297-9892 PT Time Calculation (min) (ACUTE ONLY): 23 min   Charges:   PT Evaluation $PT Eval Low Complexity: 1 Low PT Treatments $Therapeutic  Exercise: 8-22 mins       Lieutenant Diego PT, DPT 1:30 PM,11/05/19

## 2019-11-05 NOTE — ED Notes (Signed)
Pt up to bedside recliner. Pt on RA at this time. Breathing TX given

## 2019-11-05 NOTE — Progress Notes (Signed)
[facility] PROGRESS NOTE   [name] [name] SFK:[id number] DOB: [birth year] DOA: 11/04/2019  PCP: Kirk Ruths, MD  Brief History/Interval Summary: 86 y.o. female with medical history significant for chronic anemia secondary to chronic GI bleed from angiodysplasia, COPD, ex-smoker x30 years, HTN and macular degeneration, recently treated on 10/11 as an outpatient for pneumonia with antibiotics and steroids from which she mostly but not fully recovered, who presented to the emergency room with a 2-day history of shortness of breath and wheezing and dry cough not responding to home bronchodilator treatments.  She admits to chills but denies fever.  Denies chest pain, lower extremity pain or swelling..  Denies abdominal pain nausea vomiting or diarrhea.  Patient states that at baseline she lives independently and is very active and loves to garden however over the past 4 weeks she has no energy, feels very weak and has had very little appetite.  She was thought to have COPD exacerbation.  Hospitalized for further management.  Reason for Visit: Acute COPD exacerbation  Consultants: None  Procedures: None  Antibiotics: Anti-infectives (From admission, onward)   Start     Dose/Rate Route Frequency Ordered Stop   11/05/19 2100  cefTRIAXone (ROCEPHIN) 1 g in sodium chloride 0.9 % 100 mL IVPB        1 g 200 mL/hr over 30 Minutes Intravenous Every 24 hours 11/05/19 0028     11/04/19 2145  cefTRIAXone (ROCEPHIN) 2 g in sodium chloride 0.9 % 100 mL IVPB        2 g 200 mL/hr over 30 Minutes Intravenous  Once 11/04/19 2137 11/04/19 2243   11/04/19 2145  azithromycin (ZITHROMAX) 500 mg in sodium chloride 0.9 % 250 mL IVPB        500 mg 250 mL/hr over 60 Minutes Intravenous  Once 11/04/19 2137 11/04/19 2348      Subjective/Interval History: Patient states that she is feeling slightly better today compared to yesterday.  Denies any chest pain nausea vomiting.  Shortness of breath  is improving.    Assessment/Plan:  Acute COPD exacerbation Noted to be on 2 L of oxygen by nasal cannula.  Chest x-ray suggested atelectasis and trace pleural effusions.  Patient tested negative for influenza and SARS Cov 2.  Has diminished air entry bilateral bases.  Occasional wheezing is appreciated.  Continue with nebulizer treatments.  Patient also on systemic steroids and ceftriaxone.  Consider inhaled steroids as well.  Generalized deconditioning Likely secondary to acute illness.  PT and OT evaluation will be ordered.  History of iron deficiency anemia due to chronic blood loss/angiodysplasia of the intestinal tract Hemoglobin is 9.7.  Slightly lower than her usual baseline.  No overt bleeding noted.  We will recheck her counts tomorrow.  Hyponatremia Likely due to hypovolemia.  IV fluids being given.  Recheck labs tomorrow.  Essential hypertension Monitor blood pressures closely.  Macular degeneration Provide assistance as needed.  History of fibromyalgia Gabapentin and tizanidine.   DVT Prophylaxis: Lovenox Code Status: DNR Family Communication: Discussed with the patient.  No family at bedside Disposition Plan: Hopefully return home when improved  Status is: Inpatient  Remains inpatient appropriate because:IV treatments appropriate due to intensity of illness or inability to take PO and Inpatient level of care appropriate due to severity of illness   Dispo: The patient is from: Home              Anticipated d/c is to: Home  Anticipated d/c date is: 1 day              Patient currently is not medically stable to d/c.    Medications:  Scheduled: . enoxaparin (LOVENOX) injection  40 mg Subcutaneous Q24H  . ipratropium-albuterol  3 mL Nebulization Q6H  . methylPREDNISolone (SOLU-MEDROL) injection  40 mg Intravenous Q6H   Followed by  . [START ON 11/06/2019] predniSONE  40 mg Oral Q breakfast   Continuous: . sodium chloride 100 mL/hr at 11/05/19  1105  . cefTRIAXone (ROCEPHIN)  IV     OHY:[id number]   Objective:  Vital Signs  Vitals:   11/05/19 0634 11/05/19 0650 11/05/19 0810 11/05/19 0815  BP:  (!) 144/66 (!) 179/72 (!) 167/71  Pulse: 82 85 93 85  Resp: 17 16 (!) 26 13  Temp:  98.8 F (37.1 C)    TempSrc:  Oral    SpO2: 95% 97% 94% 100%  Weight:      Height:        Intake/Output Summary (Last 24 hours) at 11/05/2019 1117 Last data filed at 11/04/2019 2357 Gross per 24 hour  Intake 276.14 ml  Output --  Net 276.14 ml   Filed Weights   11/04/19 1644  Weight: 85 kg    General appearance: Awake alert.  In no distress Resp: Mildly tachypneic.  Few wheezes heard bilaterally.  No crackles. Cardio: S1-S2 is normal regular.  No S3-S4.  No rubs murmurs or bruit GI: Abdomen is soft.  Nontender nondistended.  Bowel sounds are present normal.  No masses organomegaly Extremities: No edema.  Moving all her extremities. Neurologic: Alert and oriented x3.  No focal neurological deficits.    Lab Results:  Data Reviewed: I have personally reviewed following labs and imaging studies  CBC: Recent Labs  Lab 11/04/19 2249  WBC 8.8  NEUTROABS 5.4  HGB 9.7*  HCT 29.4*  MCV 84.2  PLT 948    Basic Metabolic Panel: Recent Labs  Lab 11/04/19 2203  NA 129*  K 3.5  CL 90*  CO2 28  GLUCOSE 94  BUN 13  CREATININE 0.56  CALCIUM 8.7*    GFR: Estimated Creatinine Clearance: 51.2 mL/min (by C-G formula based on SCr of 0.56 mg/dL).  Liver Function Tests: Recent Labs  Lab 11/04/19 2203  AST 18  ALT 14  ALKPHOS 65  BILITOT 0.7  PROT 7.3  ALBUMIN 3.7      Recent Results (from the past 240 hour(s))  Blood culture (routine x 2)     Status: None (Preliminary result)   Collection Time: 11/04/19 10:02 PM   Specimen: BLOOD  Result Value Ref Range Status   Specimen Description BLOOD RIGHT ANTECUBITAL  Final   Special Requests   Final    BOTTLES DRAWN AEROBIC AND ANAEROBIC Blood Culture adequate volume    Culture   Final    NO GROWTH < 12 HOURS Performed at [facility], [address], [place], [place] [address]    Report Status PENDING  Incomplete  Blood culture (routine x 2)     Status: None (Preliminary result)   Collection Time: 11/04/19 10:02 PM   Specimen: BLOOD  Result Value Ref Range Status   Specimen Description BLOOD LEFT ANTECUBITAL  Final   Special Requests   Final    BOTTLES DRAWN AEROBIC AND ANAEROBIC Blood Culture adequate volume   Culture   Final    NO GROWTH < 12 HOURS Performed at [facility], [place],  [place], [place] [address]    Report Status PENDING  Incomplete  Respiratory Panel by RT PCR (Flu A&B, Covid) - Nasopharyngeal Swab     Status: None   Collection Time: 11/04/19 10:03 PM   Specimen: Nasopharyngeal Swab  Result Value Ref Range Status   SARS Coronavirus 2 by RT PCR NEGATIVE NEGATIVE Final    Comment: (NOTE) SARS-CoV-2 target nucleic acids are NOT DETECTED.  The SARS-CoV-2 RNA is generally detectable in upper respiratoy specimens during the acute phase of infection. The lowest concentration of SARS-CoV-2 viral copies this assay can detect is 131 copies/mL. A negative result does not preclude SARS-Cov-2 infection and should not be used as the sole basis for treatment or other patient management decisions. A negative result may occur with  improper specimen collection/handling, submission of specimen other than nasopharyngeal swab, presence of viral mutation(s) within the areas targeted by this assay, and inadequate number of viral copies (<131 copies/mL). A negative result must be combined with clinical observations, patient history, and epidemiological information. The expected result is Negative.  Fact Sheet for Patients:  PinkCheek.be  Fact Sheet for Healthcare Providers:  GravelBags.it  This test is no t yet approved or cleared by the [place] FDA and    has been authorized for detection and/or diagnosis of SARS-CoV-2 by FDA under an Emergency Use Authorization (EUA). This EUA will remain  in effect (meaning this test can be used) for the duration of the COVID-19 declaration under Section 564(b)(1) of the Act, 21 U.S.C. section 360bbb-3(b)(1), unless the authorization is terminated or revoked sooner.     Influenza A by PCR NEGATIVE NEGATIVE Final   Influenza B by PCR NEGATIVE NEGATIVE Final    Comment: (NOTE) The Xpert Xpress SARS-CoV-2/FLU/RSV assay is intended as an aid in  the diagnosis of influenza from Nasopharyngeal swab specimens and  should not be used as a sole basis for treatment. Nasal washings and  aspirates are unacceptable for Xpert Xpress SARS-CoV-2/FLU/RSV  testing.  Fact Sheet for Patients: PinkCheek.be  Fact Sheet for Healthcare Providers: GravelBags.it  This test is not yet approved or cleared by the [place] FDA and  has been authorized for detection and/or diagnosis of SARS-CoV-2 by  FDA under an Emergency Use Authorization (EUA). This EUA will remain  in effect (meaning this test can be used) for the duration of the  Covid-19 declaration under Section 564(b)(1) of the Act, 21  U.S.C. section 360bbb-3(b)(1), unless the authorization is  terminated or revoked. Performed at [facility], [address], [place], [place] [address]       Radiology Studies: DG Chest 2 View  Result Date: 11/04/2019 CLINICAL DATA:  86 year old female with shortness of breath. EXAM: CHEST - 2 VIEW COMPARISON:  Chest radiograph dated 10/18/2015. FINDINGS: Probable trace bilateral pleural effusions. Minimal left lung base atelectasis or infiltrate. There is background of mild chronic interstitial coarsening and bronchitic changes. No pneumothorax. The cardiac silhouette is within limits. Atherosclerotic calcification of the aorta. Osteopenia with degenerative  changes of the spine. No acute osseous pathology. IMPRESSION: 1. Probable trace bilateral pleural effusions. 2. Minimal left lung base atelectasis or infiltrate. Electronically Signed   By: Anner Crete M.D.   On: 11/04/2019 17:28       LOS: 1 day   [facility] Pager on [email or web address]  11/05/2019, 11:17 AM

## 2019-11-05 NOTE — Evaluation (Signed)
Occupational Therapy Evaluation Patient Details Name: [name] [name] MRN: [id number] DOB: 1925/06/11 Today's Date: 11/05/2019    History of Present Illness Pt is a 86 y.o. female with medical history significant for chronic anemia secondary to chronic GI bleed from angiodysplasia, COPD, ex-smoker x30 years, HTN and macular degeneration, recently treated on 10/11 for pneumonia with antibiotics and steroids from which she mostly but not fully recovered, who presented to the emergency room with a 2-day history of shortness of breath and wheezing and dry cough not responding to home bronchodilator treatments.   Clinical Impression   [name] [name] was seen for OT evaluation this date. Pt was modified independent in all ADL, using a SPC for functional mobility. She endorses living in a 1-level independent villa at [facility]. Pt states she tries to stay as active as possible and states she enjoys gardening and cooking. Pt denies use of supplemental O2 in the home. Over the last week, pt reports becoming easily fatigued or short of breath with minimal exertion. Pt currently requires supervision assist for exertional ADL management including LB dressing, bathing, and functional mobility due to current functional impairments (See OT Problem List below). Pt educated in energy conservation strategies including pursed lip breathing, activity pacing, home/routines modifications, work simplification, AE/DME, prioritizing of meaningful occupations, and falls prevention. Handout provided. Pt verbalized understanding and would benefit from additional skilled OT services to maximize recall and carryover of learned techniques and facilitate implementation of learned techniques into daily routines. Upon discharge, recommend [place] services.       Follow Up Recommendations  Home health OT    Equipment Recommendations  3 in 1 bedside commode    Recommendations for Other Services       Precautions / Restrictions  Precautions Precautions: Fall Restrictions Weight Bearing Restrictions: No      Mobility Bed Mobility Overal bed mobility: Independent             General bed mobility comments: Deferred. Pt in recliner at start/end of session. Per PT pt was Independent for bed mobility this date.    Transfers Overall transfer level: Needs assistance Equipment used: 1 person hand held assist Transfers: Sit to/from Stand Sit to Stand: Supervision         General transfer comment: No physical assisted needed during STS, requires at least 1 ue support.    Balance Overall balance assessment: Needs assistance Sitting-balance support: Feet supported Sitting balance-Leahy Scale: Good Sitting balance - Comments: Steady static sitting, reaching within/outside BOS.   Standing balance support: During functional activity;Single extremity supported Standing balance-Leahy Scale: Fair Standing balance comment: Requires UE support for dynamic standing tasks.                           ADL either performed or assessed with clinical judgement   ADL Overall ADL's : Needs assistance/impaired                                       General ADL Comments: Pt functionally limited by cardiopulmonary status.She is easily fatigued and becomes SOB with minimal exertion. She requires supervision for safety during functional mobility/exertional ADL management including LB dressing and bathing.     Vision Baseline Vision/History: Wears glasses;Macular Degeneration Patient Visual Report: No change from baseline       Perception     Praxis  Pertinent Vitals/Pain Pain Assessment: No/denies pain     Hand Dominance     Extremity/Trunk Assessment Upper Extremity Assessment Upper Extremity Assessment: Overall WFL for tasks assessed   Lower Extremity Assessment Lower Extremity Assessment: Overall WFL for tasks assessed   Cervical / Trunk Assessment Cervical / Trunk  Assessment: Normal   Communication Communication Communication: No difficulties;HOH   Cognition Arousal/Alertness: Awake/alert Behavior During Therapy: WFL for tasks assessed/performed Overall Cognitive Status: Within Functional Limits for tasks assessed                                 General Comments: Pt pleasant, conversational, t/o session. A&O x4.   General Comments  Pt recieved on 2 L [name] [name] with O2 sats of 100%. Trialed on RA during session and O2 remains WFLs >/= 94% t/o session.    Exercises Other Exercises Other Exercises: Pt educated on role of OT in acute setting, falls prevention strategies, safe use of AE/DME for ADL management and energy conservation strategies for improved safety and functional indep upon hospital DC.   Shoulder Instructions      Home Living Family/patient expects to be discharged to:: Private residence Living Arrangements: Alone Available Help at Discharge: Family;Available PRN/intermittently Type of Home: Independent living facility [facility]) Home Access: Level entry     Home Layout: One level     Bathroom Shower/Tub: Occupational psychologist: Handicapped height Bathroom Accessibility: Yes How Accessible: Accessible via walker [place] - single point;Walker - 4 wheels;Grab bars - tub/shower          Prior Functioning/Environment Level of Independence: Independent with assistive device(s)        Comments: Pt reports she uses her SPC for most functional mobility. She endorses staying active, gardening, and doing hose cleaning. No assist needed for ADL management. Family/friends assist with driving. Pt has Macular degeneration and has not driven in ~2 years.        OT Problem List: Cardiopulmonary status limiting activity;Decreased activity tolerance;Decreased safety awareness;Impaired balance (sitting and/or standing);Decreased knowledge of use of DME or AE      OT Treatment/Interventions:  Self-care/ADL training;Therapeutic exercise;Therapeutic activities;DME and/or AE instruction;Patient/family education;Balance training;Energy conservation    OT Goals(Current goals can be found in the care plan section) Acute Rehab OT Goals Patient Stated Goal: to go home and get back to gardening. OT Goal Formulation: With patient Time For Goal Achievement: 11/19/19 Potential to Achieve Goals: Good ADL Goals Pt Will Perform Grooming: standing;with modified independence (c LRAD PRN for improved safety and functional indep.) Pt Will Perform Lower Body Dressing: sit to/from stand;with modified independence (c LRAD PRN for improved safety and functional indep.) Additional ADL Goal #1: Pt will independently verbalize a plan to implement at least 3 learned energy conservation strategies into her daily routines/home environment for improved safety and functional independence upon hospital DC.  OT Frequency: Min 1X/week   Barriers to D/C:            Co-evaluation              AM-PAC OT "6 Clicks" Daily Activity     Outcome Measure Help from another person eating meals?: None Help from another person taking care of personal grooming?: None Help from another person toileting, which includes using toliet, bedpan, or urinal?: A Little Help from another person bathing (including washing, rinsing, drying)?: A Little Help from another person to put on  and taking off regular upper body clothing?: A Little Help from another person to put on and taking off regular lower body clothing?: A Little 6 Click Score: 20   End of Session Equipment Utilized During Treatment: Oxygen  Activity Tolerance: Patient tolerated treatment well Patient left: in chair;with call bell/phone within reach;with chair alarm set  OT Visit Diagnosis: Other abnormalities of gait and mobility (R26.89)                Time: 6148-3073 OT Time Calculation (min): 20 min Charges:  OT General Charges $OT Visit: 1 Visit OT  Evaluation $OT Eval Low Complexity: 1 Low OT Treatments $Self Care/Home Management : 8-22 mins  Shara Blazing, M.S., OTR/L Ascom: [phone] 11/05/19, 2:19 PM

## 2019-11-05 NOTE — ED Notes (Signed)
Pt ambulating pt at this time.

## 2019-11-05 NOTE — ED Notes (Signed)
Pt called out to use restroom. Pt unhooked from IVF and cardiac monitor. Pt is a standby assist with no distress noted. Pt does not appear dyspneic on exertion. Pt ambulated to bedside toilet to NAD. Pt is A&Ox4 and pt refuses neb treatment at this time stating "the oxygen is just fine"

## 2019-11-06 LAB — BASIC METABOLIC PANEL
Anion gap: 11 (ref 5–15)
BUN: 19 mg/dL (ref 8–23)
CO2: 25 mmol/L (ref 22–32)
Calcium: 8.6 mg/dL — ABNORMAL LOW (ref 8.9–10.3)
Chloride: 94 mmol/L — ABNORMAL LOW (ref 98–111)
Creatinine, Ser: 0.56 mg/dL (ref 0.44–1.00)
GFR, Estimated: >60 mL/min (ref >60)
Glucose, Bld: 138 mg/dL — ABNORMAL HIGH (ref 70–99)
Potassium: 3.1 mmol/L — ABNORMAL LOW (ref 3.5–5.1)
Sodium: 130 mmol/L — ABNORMAL LOW (ref 135–145)

## 2019-11-06 LAB — CBC
HCT: 29.3 % — ABNORMAL LOW (ref 36.0–46.0)
Hemoglobin: 10 g/dL — ABNORMAL LOW (ref 12.0–15.0)
MCH: 27.6 pg (ref 26.0–34.0)
MCHC: 34.1 g/dL (ref 30.0–36.0)
MCV: 80.9 fL (ref 80.0–100.0)
Platelets: 364 10*3/uL (ref 150–400)
RBC: 3.62 MIL/uL — ABNORMAL LOW (ref 3.87–5.11)
RDW: 18 % — ABNORMAL HIGH (ref 11.5–15.5)
WBC: 14.3 10*3/uL — ABNORMAL HIGH (ref 4.0–10.5)
nRBC: 0 % (ref 0.0–0.2)

## 2019-11-06 LAB — MAGNESIUM: Magnesium: 2.1 mg/dL (ref 1.7–2.4)

## 2019-11-06 MED ORDER — TIMOLOL MALEATE 0.5 % OP SOLN
1.0000 [drp] | Freq: Every day | OPHTHALMIC | Status: DC
  Administered 2019-11-06 – 2019-11-07 (×2): 1 [drp] via OPHTHALMIC
  Filled 2019-11-06: qty 5

## 2019-11-06 MED ORDER — ESCITALOPRAM OXALATE 10 MG PO TABS
20.0000 mg | ORAL_TABLET | Freq: Every day | ORAL | Status: DC
  Administered 2019-11-06 – 2019-11-07 (×2): 20 mg via ORAL
  Filled 2019-11-06 (×2): qty 2

## 2019-11-06 MED ORDER — PANTOPRAZOLE SODIUM 40 MG PO TBEC
40.0000 mg | DELAYED_RELEASE_TABLET | Freq: Every day | ORAL | Status: DC
  Administered 2019-11-06 – 2019-11-07 (×2): 40 mg via ORAL
  Filled 2019-11-06 (×2): qty 1

## 2019-11-06 MED ORDER — POTASSIUM CHLORIDE CRYS ER 20 MEQ PO TBCR
40.0000 meq | EXTENDED_RELEASE_TABLET | Freq: Once | ORAL | Status: AC
  Administered 2019-11-06: 40 meq via ORAL
  Filled 2019-11-06: qty 2

## 2019-11-06 MED ORDER — LOSARTAN POTASSIUM 50 MG PO TABS
50.0000 mg | ORAL_TABLET | Freq: Every day | ORAL | Status: DC
  Administered 2019-11-06 – 2019-11-07 (×2): 50 mg via ORAL
  Filled 2019-11-06 (×2): qty 1

## 2019-11-06 MED ORDER — CEFDINIR 300 MG PO CAPS
300.0000 mg | ORAL_CAPSULE | Freq: Two times a day (BID) | ORAL | Status: DC
  Administered 2019-11-06 – 2019-11-07 (×2): 300 mg via ORAL
  Filled 2019-11-06 (×3): qty 1

## 2019-11-06 MED ORDER — IPRATROPIUM-ALBUTEROL 0.5-2.5 (3) MG/3ML IN SOLN
3.0000 mL | Freq: Three times a day (TID) | RESPIRATORY_TRACT | Status: DC
  Administered 2019-11-07: 3 mL via RESPIRATORY_TRACT
  Filled 2019-11-06: qty 3

## 2019-11-06 MED ORDER — GUAIFENESIN-DM 100-10 MG/5ML PO SYRP
5.0000 mL | ORAL_SOLUTION | ORAL | Status: DC | PRN
  Administered 2019-11-06: 05:00:00 5 mL via ORAL
  Filled 2019-11-06: qty 5

## 2019-11-06 NOTE — TOC Initial Note (Signed)
Transition of Care [facility]) - Initial/Assessment Note    Patient Details  Name: [name] [name] MRN: [id number] Date of Birth: Jul 05, 1925  Transition of [facility]) CM/SW Contact:    Shelbie Hutching, RN Phone Number: 11/06/2019, 4:17 PM  Clinical Narrative:                 Patient lives at [facility] independent living.  Patient reports that she is independent at home and works in her yard and prepares her meals.  Patient normally walks with a cane but she also has a walker.  Patient has a caregiver that checks in with her daily and provides transportation for her.  Patient is current with PCP.  Patient does not want PT set up at [facility], she reports she has had PT before and she doesn't want to do it again.   TOC team will cont to follow for needs.   Expected Discharge Plan: Home/Self Care Barriers to Discharge: Continued Medical Work up   Patient Goals and CMS Choice Patient states their goals for this hospitalization and ongoing recovery are:: Patient feeling much better and will be glad to get home      Expected Discharge Plan and Services Expected Discharge Plan: Home/Self Care   Discharge Planning Services: CM Consult   Living arrangements for the past 2 months: [place]                   DME Agency: NA       HH Arranged: Patient Refused [place]          Prior Living Arrangements/Services Living arrangements for the past 2 months: [name] [name] Lives with:: Self Patient language and need for interpreter reviewed:: Yes Do you feel safe going back to the place where you live?: Yes      Need for Family Participation in Patient Care: Yes (Comment) Care giver support system in place?: Yes (comment) Current home services: DME (Cane, walker) Criminal Activity/Legal Involvement Pertinent to Current Situation/Hospitalization: No - Comment as needed  Activities of Daily Living Home Assistive Devices/Equipment: Cane (specify quad or  straight), Walker (specify type), Eyeglasses, Hearing aid ADL Screening (condition at time of admission) Patient's cognitive ability adequate to safely complete daily activities?: Yes Is the patient deaf or have difficulty hearing?: Yes Does the patient have difficulty seeing, even when wearing glasses/contacts?: No Does the patient have difficulty concentrating, remembering, or making decisions?: No Patient able to express need for assistance with ADLs?: Yes Does the patient have difficulty dressing or bathing?: No Independently performs ADLs?: Yes (appropriate for developmental age) Does the patient have difficulty walking or climbing stairs?: Yes Weakness of Legs: Both Weakness of Arms/Hands: None  Permission Sought/Granted Permission sought to share information with : Case Manager                Emotional Assessment Appearance:: Appears stated age Attitude/Demeanor/Rapport: Engaged Affect (typically observed): Accepting Orientation: : Oriented to Self, Oriented to Place, Oriented to  Time, Oriented to Situation Alcohol / Substance Use: Not Applicable Psych Involvement: No (comment)  Admission diagnosis:  Hyponatremia [E87.1] COPD exacerbation (HCC) [J44.1] COPD with acute exacerbation ([place]) [J44.1] Patient Active Problem List   Diagnosis Date Noted  . Hyponatremia 11/05/2019  . Generalized weakness 11/05/2019  . COPD with acute exacerbation ([place]) 11/04/2019  . Fibromyositis 06/12/2017  . Pain 06/12/2017  . Healthcare maintenance 01/17/2017  . Anemia 05/30/2016  . Angiodysplasia of intestinal tract   . Blood in stool   .  Benign neoplasm of ascending colon   . UGIB (upper gastrointestinal bleed)   . GIB (gastrointestinal bleeding) 04/12/2016  . Melena   . Healthcare-associated pertussis 10/10/2015  . Healthcare-associated pneumonia 10/10/2015  . GI bleed 08/10/2015  . Iron deficiency anemia due to chronic blood loss 08/10/2015  . HTN (hypertension), benign  08/10/2015  . HLD (hyperlipidemia) 08/10/2015  . GERD (gastroesophageal reflux disease) 08/10/2015  . Chronic obstructive pulmonary disease ([place]) 08/10/2015  . Glaucoma suspect 12/26/2012  . Fatigue 02/12/2012  . H/O total hip arthroplasty 01/25/2012  . Total knee replacement status 01/25/2012  . Arthritis of knee 01/18/2012  . ARMD (age related macular degeneration) 11/21/2011  . Foot drop 11/15/2011  . Hallux valgus 11/15/2011  . Hammer toe, acquired 11/15/2011  . Recurrent major depressive disorder, in partial remission ([place]) 07/10/2011  . Degenerative disc disease 04/11/2011  . Fibromyalgia 02/10/2011  . Diverticulosis of colon 07/18/2010  . Gastric ulcer 07/18/2010  . Hereditary and idiopathic peripheral neuropathy 07/18/2010  . Meniere's disease 07/18/2010  . Osteoarthritis, generalized 07/18/2010  . Hemorrhoids 07/18/2010   PCP:  Kirk Ruths, MD Pharmacy:   [facility] [place], [place] [address] [place] [address] Phone: [phone] Fax: [phone]     Social Determinants of Health (SDOH) Interventions    Readmission Risk Interventions No flowsheet data found.

## 2019-11-06 NOTE — Plan of Care (Signed)
Pt lives independently at [facility]. Also has a home aid from [employer]. A&O. 1 asst to [facility].  Currently on RA. No c/o pain.  2 peripheral IVs.

## 2019-11-06 NOTE — Progress Notes (Signed)
Nutrition Brief Note  RD received consult for assessment of nutrition requirements/ status per COPD protocol.  Wt Readings from Last 15 Encounters:  11/04/19 85 kg  06/02/19 85.2 kg  12/02/18 85.3 kg  07/25/18 85.7 kg  07/25/18 84.4 kg  06/21/18 85.3 kg  09/25/17 85.3 kg  07/05/17 83.9 kg  06/26/17 84.4 kg  06/26/17 84.4 kg  12/26/16 82.6 kg  09/19/16 83.6 kg  08/22/16 82.6 kg  07/25/16 83.7 kg  05/30/16 48.20 kg   86 year old female with PMHx of COPD, HTN, diverticulitis, glaucoma, GERD, HLD, iron deficiency, chronic anemia secondary to chronic GI bleed from angiodysplasia admitted with acute exacerbation of COPD.  Met with patient at bedside. She reports she has good appetite and intake at baseline. She lives alone but has assistance from her son-in-law and a friend. She cooks for herself, cleans, and gardens. Patient reports she typically eats 3 meals per day. He eats high protein at meals and also chooses foods that are high in iron. Patient reports she has a good appetite here and she is eating 100% of her meals. She denies any unintentional weight loss and reports she is weight-stable at around 189 lbs. Patient is currently documented to be 85 kg (187.39 lbs). Completed Nutrition-Focused Physical Exam and patient only found to have mild muscle depletion at dorsal hand region. Patient does not meet criteria for malnutrition at this time. Discussed via secure chat with MD and plan is to liberalize diet from heart healthy to regular. No further nutrition needs identified at this time.  Nutrition Focused Physical Exam:   Most Recent Value  Orbital Region No depletion  Upper Arm Region No depletion  Thoracic and Lumbar Region No depletion  Buccal Region No depletion  Temple Region No depletion  Clavicle Bone Region No depletion  Clavicle and Acromion Bone Region No depletion  Scapular Bone Region No depletion  Dorsal Hand Mild depletion  Patellar Region No depletion  Anterior  Thigh Region No depletion  Posterior Calf Region No depletion  Edema (RD Assessment) None  Hair Reviewed  Eyes Reviewed  Mouth Reviewed  Skin Reviewed  Nails Reviewed      Body mass index is 28.49 kg/m. Patient meets criteria for overweight based on current BMI.   Current diet order is heart healthy, patient is consuming approximately 100% of meals at this time. Labs and medications reviewed.   No nutrition interventions warranted at this time. If nutrition issues arise, please consult RD.   Jacklynn Barnacle, MS, RD, LDN Pager number available on Amion

## 2019-11-06 NOTE — Progress Notes (Signed)
[facility] PROGRESS NOTE   [name] [name] ONG:[id number] DOB: [birth year] DOA: 11/04/2019  PCP: Kirk Ruths, MD  Brief History/Interval Summary: 86 y.o. female with medical history significant for chronic anemia secondary to chronic GI bleed from angiodysplasia, COPD, ex-smoker x30 years, HTN and macular degeneration, recently treated on 10/11 as an outpatient for pneumonia with antibiotics and steroids from which she mostly but not fully recovered, who presented to the emergency room with a 2-day history of shortness of breath and wheezing and dry cough not responding to home bronchodilator treatments.  She admits to chills but denies fever.  Denies chest pain, lower extremity pain or swelling..  Denies abdominal pain nausea vomiting or diarrhea.  Patient states that at baseline she lives independently and is very active and loves to garden however over the past 4 weeks she has no energy, feels very weak and has had very little appetite.  She was thought to have COPD exacerbation.  Hospitalized for further management.  Reason for Visit: Acute COPD exacerbation  Consultants: None  Procedures: None  Antibiotics: Anti-infectives (From admission, onward)   Start     Dose/Rate Route Frequency Ordered Stop   11/05/19 2100  cefTRIAXone (ROCEPHIN) 1 g in sodium chloride 0.9 % 100 mL IVPB        1 g 200 mL/hr over 30 Minutes Intravenous Every 24 hours 11/05/19 0028     11/04/19 2145  cefTRIAXone (ROCEPHIN) 2 g in sodium chloride 0.9 % 100 mL IVPB        2 g 200 mL/hr over 30 Minutes Intravenous  Once 11/04/19 2137 11/04/19 2243   11/04/19 2145  azithromycin (ZITHROMAX) 500 mg in sodium chloride 0.9 % 250 mL IVPB        500 mg 250 mL/hr over 60 Minutes Intravenous  Once 11/04/19 2137 11/04/19 2348      Subjective/Interval History: Patient upset that she has not been started back on some of her home medications.  Rationale for holding some of her antihypertensives and  diuretics were explained to her.  From a respiratory standpoint she did have an episode overnight patient got short of breath.  Feels better this morning.  Denies any chest pain.      Assessment/Plan:  Acute COPD exacerbation Seems to be stable.  Better today compared to yesterday.  Noted to be on room air saturating in the mid to late 90s.  Continue with nebulizer treatment and steroids.  Change ceftriaxone to Omnicef.  Chest x-ray showed atelectasis and trace pleural effusion.  Patient tested negative for influenza and SARS-CoV-2.  Leukocytosis likely due to steroids.  Generalized deconditioning Likely secondary to acute illness.  PT and OT following.  Home health recommended.  History of iron deficiency anemia due to chronic blood loss/angiodysplasia of the intestinal tract Hemoglobin is stable.  No evidence of overt bleeding.  Continue to monitor.    Hyponatremia/hypokalemia Likely due to hypovolemia.  Improved with IV hydration.  Replace potassium.  Continue to hold her diuretic.    Essential hypertension Blood pressure now in the hypertensive range.  Losartan resumed.  Macular degeneration Provide assistance as needed.  History of fibromyalgia Gabapentin and tizanidine.   DVT Prophylaxis: Lovenox Code Status: DNR Family Communication: Discussed with the patient.  She requests that I update her son-in-law. Disposition Plan: Hopefully return home when improved  Status is: Inpatient  Remains inpatient appropriate because:IV treatments appropriate due to intensity of illness or inability to take PO and Inpatient level of care appropriate  due to severity of illness   Dispo: The patient is from: Home              Anticipated d/c is to: Home              Anticipated d/c date is: 10/29              Patient currently is not medically stable to d/c.    Medications:  Scheduled: . cycloSPORINE  1 drop Both Eyes BID  . enoxaparin (LOVENOX) injection  40 mg Subcutaneous Q24H    . fluticasone  2 puff Inhalation BID  . gabapentin  300 mg Oral BID  . influenza vaccine adjuvanted  0.5 mL Intramuscular Tomorrow-1000  . ipratropium-albuterol  3 mL Nebulization Q6H  . losartan  50 mg Oral Daily  . predniSONE  40 mg Oral Q breakfast  . timolol  1 drop Both Eyes Daily   Continuous: . sodium chloride 100 mL/hr at 11/06/19 0314  . cefTRIAXone (ROCEPHIN)  IV Stopped (11/05/19 2015)   SNK:NLZJQBHAL, guaiFENesin-dextromethorphan, tiZANidine   Objective:  Vital Signs  Vitals:   11/06/19 0159 11/06/19 0403 11/06/19 0730 11/06/19 0753  BP:  (!) 171/80 (!) 151/65   Pulse:  80 73   Resp:  20 20   Temp:  (!) 97.2 F (36.2 C) (!) 97.5 F (36.4 C)   TempSrc:      SpO2: 94% 99% 100% 100%  Weight:      Height:        Intake/Output Summary (Last 24 hours) at 11/06/2019 1037 Last data filed at 11/06/2019 0314 Gross per 24 hour  Intake 1473.14 ml  Output --  Net 1473.14 ml   Filed Weights   11/04/19 1644  Weight: 85 kg    General appearance: Awake alert.  In no distress Resp: Improved aeration bilaterally.  Less wheezing today compared to yesterday.  No crackles. Cardio: S1-S2 is normal regular.  No S3-S4.  No rubs murmurs or bruit GI: Abdomen is soft.  Nontender nondistended.  Bowel sounds are present normal.  No masses organomegaly Extremities: No edema.  Moving all extremities Neurologic: No focal neurological deficits.      Lab Results:  Data Reviewed: I have personally reviewed following labs and imaging studies  CBC: Recent Labs  Lab 11/04/19 2249 11/06/19 0424  WBC 8.8 14.3*  NEUTROABS 5.4  --   HGB 9.7* 10.0*  HCT 29.4* 29.3*  MCV 84.2 80.9  PLT 315 937    Basic Metabolic Panel: Recent Labs  Lab 11/04/19 2203 11/06/19 0424  NA 129* 130*  K 3.5 3.1*  CL 90* 94*  CO2 28 25  GLUCOSE 94 138*  BUN 13 19  CREATININE 0.56 0.56  CALCIUM 8.7* 8.6*  MG  --  2.1    GFR: Estimated Creatinine Clearance: 51.2 mL/min (by C-G formula  based on SCr of 0.56 mg/dL).  Liver Function Tests: Recent Labs  Lab 11/04/19 2203  AST 18  ALT 14  ALKPHOS 65  BILITOT 0.7  PROT 7.3  ALBUMIN 3.7      Recent Results (from the past 240 hour(s))  Blood culture (routine x 2)     Status: None (Preliminary result)   Collection Time: 11/04/19 10:02 PM   Specimen: BLOOD  Result Value Ref Range Status   Specimen Description BLOOD RIGHT ANTECUBITAL  Final   Special Requests   Final    BOTTLES DRAWN AEROBIC AND ANAEROBIC Blood Culture adequate volume   Culture   Final  NO GROWTH 2 DAYS Performed at [facility], [address] [place], [place] [address]    Report Status PENDING  Incomplete  Blood culture (routine x 2)     Status: None (Preliminary result)   Collection Time: 11/04/19 10:02 PM   Specimen: BLOOD  Result Value Ref Range Status   Specimen Description BLOOD LEFT ANTECUBITAL  Final   Special Requests   Final    BOTTLES DRAWN AEROBIC AND ANAEROBIC Blood Culture adequate volume   Culture   Final    NO GROWTH 2 DAYS Performed at [facility], [address], [place], [place] [address]    Report Status PENDING  Incomplete  Respiratory Panel by RT PCR (Flu A&B, Covid) - Nasopharyngeal Swab     Status: None   Collection Time: 11/04/19 10:03 PM   Specimen: Nasopharyngeal Swab  Result Value Ref Range Status   SARS Coronavirus 2 by RT PCR NEGATIVE NEGATIVE Final    Comment: (NOTE) SARS-CoV-2 target nucleic acids are NOT DETECTED.  The SARS-CoV-2 RNA is generally detectable in upper respiratoy specimens during the acute phase of infection. The lowest concentration of SARS-CoV-2 viral copies this assay can detect is 131 copies/mL. A negative result does not preclude SARS-Cov-2 infection and should not be used as the sole basis for treatment or other patient management decisions. A negative result may occur with  improper specimen collection/handling, submission of specimen other than  nasopharyngeal swab, presence of viral mutation(s) within the areas targeted by this assay, and inadequate number of viral copies (<131 copies/mL). A negative result must be combined with clinical observations, patient history, and epidemiological information. The expected result is Negative.  Fact Sheet for Patients:  PinkCheek.be  Fact Sheet for Healthcare Providers:  GravelBags.it  This test is no t yet approved or cleared by the [place] FDA and  has been authorized for detection and/or diagnosis of SARS-CoV-2 by FDA under an Emergency Use Authorization (EUA). This EUA will remain  in effect (meaning this test can be used) for the duration of the COVID-19 declaration under Section 564(b)(1) of the Act, 21 U.S.C. section 360bbb-3(b)(1), unless the authorization is terminated or revoked sooner.     Influenza A by PCR NEGATIVE NEGATIVE Final   Influenza B by PCR NEGATIVE NEGATIVE Final    Comment: (NOTE) The Xpert Xpress SARS-CoV-2/FLU/RSV assay is intended as an aid in  the diagnosis of influenza from Nasopharyngeal swab specimens and  should not be used as a sole basis for treatment. Nasal washings and  aspirates are unacceptable for Xpert Xpress SARS-CoV-2/FLU/RSV  testing.  Fact Sheet for Patients: PinkCheek.be  Fact Sheet for Healthcare Providers: GravelBags.it  This test is not yet approved or cleared by the [place] FDA and  has been authorized for detection and/or diagnosis of SARS-CoV-2 by  FDA under an Emergency Use Authorization (EUA). This EUA will remain  in effect (meaning this test can be used) for the duration of the  Covid-19 declaration under Section 564(b)(1) of the Act, 21  U.S.C. section 360bbb-3(b)(1), unless the authorization is  terminated or revoked. Performed at [facility], [address], [place], [place]  [address]       Radiology Studies: DG Chest 2 View  Result Date: 11/04/2019 CLINICAL DATA:  86 year old female with shortness of breath. EXAM: CHEST - 2 VIEW COMPARISON:  Chest radiograph dated 10/18/2015. FINDINGS: Probable trace bilateral pleural effusions. Minimal left lung base atelectasis or infiltrate. There is background of mild chronic interstitial coarsening  and bronchitic changes. No pneumothorax. The cardiac silhouette is within limits. Atherosclerotic calcification of the aorta. Osteopenia with degenerative changes of the spine. No acute osseous pathology. IMPRESSION: 1. Probable trace bilateral pleural effusions. 2. Minimal left lung base atelectasis or infiltrate. Electronically Signed   By: Anner Crete M.D.   On: 11/04/2019 17:28       LOS: 2 days   [facility] Pager on [email or web address]  11/06/2019, 10:37 AM

## 2019-11-07 LAB — BASIC METABOLIC PANEL
Anion gap: 9 (ref 5–15)
BUN: 19 mg/dL (ref 8–23)
CO2: 25 mmol/L (ref 22–32)
Calcium: 8.6 mg/dL — ABNORMAL LOW (ref 8.9–10.3)
Chloride: 97 mmol/L — ABNORMAL LOW (ref 98–111)
Creatinine, Ser: 0.59 mg/dL (ref 0.44–1.00)
GFR, Estimated: >60 mL/min (ref >60)
Glucose, Bld: 103 mg/dL — ABNORMAL HIGH (ref 70–99)
Potassium: 3.8 mmol/L (ref 3.5–5.1)
Sodium: 131 mmol/L — ABNORMAL LOW (ref 135–145)

## 2019-11-07 LAB — CBC
HCT: 26.4 % — ABNORMAL LOW (ref 36.0–46.0)
Hemoglobin: 9 g/dL — ABNORMAL LOW (ref 12.0–15.0)
MCH: 28 pg (ref 26.0–34.0)
MCHC: 34.1 g/dL (ref 30.0–36.0)
MCV: 82.2 fL (ref 80.0–100.0)
Platelets: 320 10*3/uL (ref 150–400)
RBC: 3.21 MIL/uL — ABNORMAL LOW (ref 3.87–5.11)
RDW: 18.3 % — ABNORMAL HIGH (ref 11.5–15.5)
WBC: 11 10*3/uL — ABNORMAL HIGH (ref 4.0–10.5)
nRBC: 0 % (ref 0.0–0.2)

## 2019-11-07 MED ORDER — CEFDINIR 300 MG PO CAPS: 300.0000 mg | ORAL_CAPSULE | Freq: Two times a day (BID) | ORAL | 0 refills | Status: AC

## 2019-11-07 MED ORDER — CHLORTHALIDONE 25 MG PO TABS: 12.5000 mg | ORAL_TABLET | Freq: Every day | ORAL | Status: DC

## 2019-11-07 MED ORDER — RISAQUAD PO CAPS
1.0000 | ORAL_CAPSULE | Freq: Every day | ORAL | 0 refills | Status: DC
Start: 2019-11-08 — End: 2021-12-06

## 2019-11-07 MED ORDER — LOPERAMIDE HCL 2 MG PO CAPS
4.0000 mg | ORAL_CAPSULE | Freq: Once | ORAL | Status: AC
  Administered 2019-11-07: 10:00:00 4 mg via ORAL
  Filled 2019-11-07: qty 2

## 2019-11-07 MED ORDER — PREDNISONE 20 MG PO TABS: ORAL_TABLET | ORAL | 0 refills | Status: DC

## 2019-11-07 MED ORDER — RISAQUAD PO CAPS
1.0000 | ORAL_CAPSULE | Freq: Every day | ORAL | Status: DC
  Administered 2019-11-07: 1 via ORAL
  Filled 2019-11-07: qty 1

## 2019-11-07 MED ORDER — IPRATROPIUM-ALBUTEROL 20-100 MCG/ACT IN AERS: 2.0000 | INHALATION_SPRAY | Freq: Four times a day (QID) | RESPIRATORY_TRACT | 1 refills | Status: DC

## 2019-11-07 NOTE — Discharge Summary (Signed)
[facility]  Physician Discharge Summary   Patient ID: [name] [name] MRN: [id number] DOB/AGE: 1925-10-01 86 y.o.  Admit date: 11/04/2019 Discharge date: 11/07/2019  PCP: Kirk Ruths, MD  DISCHARGE DIAGNOSES:  COPD with acute exacerbation, improved History of iron deficiency anemia due to chronic blood loss Angiodysplasia of the intestinal tract Hyponatremia and hypokalemia Essential hypertension Macular degeneration History of fibromyalgia   RECOMMENDATIONS FOR OUTPATIENT FOLLOW UP: 1. Consider referral to pulmonology if symptoms do not get better or if they recur    Home Health: Home health PT and OT Equipment/Devices: None  CODE STATUS: Full code  DISCHARGE CONDITION: fair  Diet recommendation: As before  INITIAL HISTORY: 86 y.o.femalewith medical history significant forchronic anemia secondary to chronic GI bleed from angiodysplasia, COPD,ex-smoker x30 years,HTN and macular degeneration, recently treated on 10/11 as an outpatient for pneumonia with antibiotics and steroids from which she mostly but not fully recovered, who presented to the emergency room with a2-day history of shortness of breath and wheezing and dry cough not responding to home bronchodilator treatments. She admits to chills but denies fever. Denies chest pain, lower extremity pain or swelling.. Denies abdominal pain nausea vomiting or diarrhea.Patient states that at baseline she lives independently and is very active and loves to garden however over the past 4 weeks she has no energy, feels very weak and has had very little appetite.  She was thought to have COPD exacerbation.  Hospitalized for further management.   HOSPITAL COURSE:   Acute COPD exacerbation Patient was initially requiring about 2 L of oxygen.  She was given nebulizer treatments and steroids.  She was weaned off oxygen.  Patient tested negative for influenza and SARS-CoV-2.  She started feeling  better.  She started ambulating.  Feels like she is back to baseline now.  She will be discharged on tapering doses of steroids.  Also will be discharged on 3 more days of oral antibiotics.  She did experience some loose stool.  Imodium was given.  Probiotics will be provided.  Did not have any abdominal discomfort or pain.  Apparently this is her second episode in the last few weeks.  Discussed with her son-in-law who is a Pharmacist, community.  It may not be unreasonable to consider referral to pulmonology if she has recurrence of her symptoms or if she does not get back to baseline.    Generalized deconditioning Likely secondary to acute illness.   Seen by PT and OT.  Home health ordered.  History of iron deficiency anemia due to chronic blood loss/angiodysplasia of the intestinal tract Hemoglobin is stable.  No evidence of overt bleeding.    Hyponatremia/hypokalemia Hyponatremia is likely due to combination of hypovolemia as well as diuretics.  Improved with IV hydration.  Potassium was repleted.  Recommend holding diuretic for a few more days.    Essential hypertension Continue with losartan.  Resume her diuretic in a few days.  Macular degeneration   History of fibromyalgia Gabapentin and tizanidine.   Overall she is stable.  Feels better.  Okay for discharge home today.   PERTINENT LABS:  The results of significant diagnostics from this hospitalization (including imaging, microbiology, ancillary and laboratory) are listed below for reference.    Microbiology: Recent Results (from the past 240 hour(s))  Blood culture (routine x 2)     Status: None (Preliminary result)   Collection Time: 11/04/19 10:02 PM   Specimen: BLOOD  Result Value Ref Range Status   Specimen Description BLOOD RIGHT ANTECUBITAL  Final   Special Requests   Final    BOTTLES DRAWN AEROBIC AND ANAEROBIC Blood Culture adequate volume   Culture   Final    NO GROWTH 3 DAYS Performed at [facility],  [address] [place], [place] [address]    Report Status PENDING  Incomplete  Blood culture (routine x 2)     Status: None (Preliminary result)   Collection Time: 11/04/19 10:02 PM   Specimen: BLOOD  Result Value Ref Range Status   Specimen Description BLOOD LEFT ANTECUBITAL  Final   Special Requests   Final    BOTTLES DRAWN AEROBIC AND ANAEROBIC Blood Culture adequate volume   Culture   Final    NO GROWTH 3 DAYS Performed at [facility], [address], [place], [place] [address]    Report Status PENDING  Incomplete  Respiratory Panel by RT PCR (Flu A&B, Covid) - Nasopharyngeal Swab     Status: None   Collection Time: 11/04/19 10:03 PM   Specimen: Nasopharyngeal Swab  Result Value Ref Range Status   SARS Coronavirus 2 by RT PCR NEGATIVE NEGATIVE Final    Comment: (NOTE) SARS-CoV-2 target nucleic acids are NOT DETECTED.  The SARS-CoV-2 RNA is generally detectable in upper respiratoy specimens during the acute phase of infection. The lowest concentration of SARS-CoV-2 viral copies this assay can detect is 131 copies/mL. A negative result does not preclude SARS-Cov-2 infection and should not be used as the sole basis for treatment or other patient management decisions. A negative result may occur with  improper specimen collection/handling, submission of specimen other than nasopharyngeal swab, presence of viral mutation(s) within the areas targeted by this assay, and inadequate number of viral copies (<131 copies/mL). A negative result must be combined with clinical observations, patient history, and epidemiological information. The expected result is Negative.  Fact Sheet for Patients:  PinkCheek.be  Fact Sheet for Healthcare Providers:  GravelBags.it  This test is no t yet approved or cleared by the [place] FDA and  has been authorized for detection and/or diagnosis of SARS-CoV-2 by FDA under an  Emergency Use Authorization (EUA). This EUA will remain  in effect (meaning this test can be used) for the duration of the COVID-19 declaration under Section 564(b)(1) of the Act, 21 U.S.C. section 360bbb-3(b)(1), unless the authorization is terminated or revoked sooner.     Influenza A by PCR NEGATIVE NEGATIVE Final   Influenza B by PCR NEGATIVE NEGATIVE Final    Comment: (NOTE) The Xpert Xpress SARS-CoV-2/FLU/RSV assay is intended as an aid in  the diagnosis of influenza from Nasopharyngeal swab specimens and  should not be used as a sole basis for treatment. Nasal washings and  aspirates are unacceptable for Xpert Xpress SARS-CoV-2/FLU/RSV  testing.  Fact Sheet for Patients: PinkCheek.be  Fact Sheet for Healthcare Providers: GravelBags.it  This test is not yet approved or cleared by the [place] FDA and  has been authorized for detection and/or diagnosis of SARS-CoV-2 by  FDA under an Emergency Use Authorization (EUA). This EUA will remain  in effect (meaning this test can be used) for the duration of the  Covid-19 declaration under Section 564(b)(1) of the Act, 21  U.S.C. section 360bbb-3(b)(1), unless the authorization is  terminated or revoked. Performed at [facility], [address] [place], [place] [address]      Labs:  [place]   Lab Results  Component Value Date   [place] 11/04/2019   [place] NEGATIVE 06/19/2018  Basic Metabolic Panel: Recent Labs  Lab 11/04/19 2203 11/06/19 0424 11/07/19 0427  NA 129* 130* 131*  K 3.5 3.1* 3.8  CL 90* 94* 97*  CO2 28 25 25   GLUCOSE 94 138* 103*  BUN 13 19 19   CREATININE 0.56 0.56 0.59  CALCIUM 8.7* 8.6* 8.6*  MG  --  2.1  --    Liver Function Tests: Recent Labs  Lab 11/04/19 2203  AST 18  ALT 14  ALKPHOS 65  BILITOT 0.7  PROT 7.3  ALBUMIN 3.7   CBC: Recent Labs  Lab 11/04/19 2249 11/06/19 0424  11/07/19 0427  WBC 8.8 14.3* 11.0*  NEUTROABS 5.4  --   --   HGB 9.7* 10.0* 9.0*  HCT 29.4* 29.3* 26.4*  MCV 84.2 80.9 82.2  PLT 315 364 320   BNP: BNP (last 3 results) Recent Labs    11/04/19 2203  BNP 27.1     IMAGING STUDIES DG Chest 2 View  Result Date: 11/04/2019 CLINICAL DATA:  86 year old female with shortness of breath. EXAM: CHEST - 2 VIEW COMPARISON:  Chest radiograph dated 10/18/2015. FINDINGS: Probable trace bilateral pleural effusions. Minimal left lung base atelectasis or infiltrate. There is background of mild chronic interstitial coarsening and bronchitic changes. No pneumothorax. The cardiac silhouette is within limits. Atherosclerotic calcification of the aorta. Osteopenia with degenerative changes of the spine. No acute osseous pathology. IMPRESSION: 1. Probable trace bilateral pleural effusions. 2. Minimal left lung base atelectasis or infiltrate. Electronically Signed   By: Anner Crete M.D.   On: 11/04/2019 17:28    DISCHARGE EXAMINATION: Vitals:   11/06/19 1934 11/06/19 1944 11/06/19 2354 11/07/19 0727  BP: 132/81  (!) 116/51 (!) 150/69  Pulse: 77  72 66  Resp: 16  17 16   Temp: 98 F (36.7 C)  97.7 F (36.5 C) 98 F (36.7 C)  TempSrc: Oral  Oral Oral  SpO2: 100% 100% 96% 97%  Weight:      Height:       General appearance: Awake alert.  In no distress Resp: Clear to auscultation bilaterally.  Normal effort Cardio: S1-S2 is normal regular.  No S3-S4.  No rubs murmurs or bruit GI: Abdomen is soft.  Nontender nondistended.  Bowel sounds are present normal.  No masses organomegaly    DISPOSITION: Home  Discharge Instructions    Call MD for:  difficulty breathing, headache or visual disturbances   Complete by: As directed    Call MD for:  extreme fatigue   Complete by: As directed    Call MD for:  persistant dizziness or light-headedness   Complete by: As directed    Call MD for:  persistant nausea and vomiting   Complete by: As directed      Call MD for:  severe uncontrolled pain   Complete by: As directed    Call MD for:  temperature >100.4   Complete by: As directed    Diet general   Complete by: As directed    Discharge instructions   Complete by: As directed    Please take your medications as prescribed.  Follow-up with your PCP in 1 week.  If you continue to have respiratory difficulties then you may need to be referred to pulmonology for further testing.  You were cared for by a hospitalist during your hospital stay. If you have any questions about your discharge medications or the care you received while you were in the hospital after you are discharged, you can call the unit  and asked to speak with the hospitalist on call if the hospitalist that took care of you is not available. Once you are discharged, your primary care physician will handle any further medical issues. Please note that NO REFILLS for any discharge medications will be authorized once you are discharged, as it is imperative that you return to your primary care physician (or establish a relationship with a primary care physician if you do not have one) for your aftercare needs so that they can reassess your need for medications and monitor your lab values. If you do not have a primary care physician, you can call [phone] for a physician referral.   Increase activity slowly   Complete by: As directed        Allergies as of 11/07/2019      Reactions   Nsaids Hives   Dextrans Hives   Fentanyl Itching   Lipitor [atorvastatin] Other (See Comments)   Reaction: Muscle pain   Lovastatin    Mobic [meloxicam] Other (See Comments)   Reaction: Mouth and tongue ulcers   Niacin And Related Itching   Other    Polysaccharide K Hives   Pravachol [pravastatin Sodium] Other (See Comments)   Reaction: Muscle pain   Statins Other (See Comments)   Tolmetin Hives   Voltaren [diclofenac Sodium] Other (See Comments)   Reaction: Mouth and tongue ulcers   Zetia  [ezetimibe] Other (See Comments)   Reaction: Leg pain   Codeine Nausea And Vomiting   Niacin Itching      Medication List    TAKE these medications   acetaminophen 500 MG tablet Commonly known as: TYLENOL Take 500-1,000 mg by mouth every 6 (six) hours as needed for mild pain, moderate pain or fever.   acidophilus Caps capsule Take 1 capsule by mouth daily. Start taking on: November 08, 2019   albuterol 108 (90 Base) MCG/ACT inhaler Commonly known as: VENTOLIN HFA Inhale 2 puffs into the lungs every 4 (four) hours as needed for wheezing or shortness of breath.   B-12 1000 MCG Caps Take 2,000 Units by mouth daily.   cefdinir 300 MG capsule Commonly known as: OMNICEF Take 1 capsule (300 mg total) by mouth every 12 (twelve) hours for 3 days.   chlorthalidone 25 MG tablet Commonly known as: HYGROTON Take 0.5 tablets (12.5 mg total) by mouth daily. May resume on 11/1 if diarrhea resolves Start taking on: November 10, 2019 What changed:   additional instructions  These instructions start on November 10, 2019. If you are unsure what to do until then, ask your doctor or other care provider.   Coenzyme Q10 10 MG capsule Take 10 mg by mouth daily.   cycloSPORINE 0.05 % ophthalmic emulsion Commonly known as: RESTASIS Place 1 drop into both eyes 2 (two) times daily.   escitalopram 20 MG tablet Commonly known as: LEXAPRO Take 20 mg by mouth daily.   fluticasone 110 MCG/ACT inhaler Commonly known as: FLOVENT HFA Inhale 2 puffs into the lungs 2 (two) times daily.   gabapentin 300 MG capsule Commonly known as: NEURONTIN Take 300 mg by mouth 2 (two) times daily.   guaiFENesin 600 MG 12 hr tablet Commonly known as: MUCINEX Take 600 mg by mouth 2 (two) times daily as needed for cough or to loosen phlegm.   hydrocortisone 2.5 % cream Apply topically 2 (two) times daily as needed (Rash).   Ipratropium-Albuterol 20-100 MCG/ACT Aers respimat Commonly known as: COMBIVENT Inhale  2 puffs into the lungs in the  morning, at noon, in the evening, and at bedtime.   losartan 50 MG tablet Commonly known as: COZAAR Take 50 mg by mouth daily.   mometasone 0.1 % lotion Commonly known as: ELOCON Apply 1 application topically daily as needed (skin irritation).   OCUVITE PRESERVISION PO Take 1 tablet by mouth 2 (two) times daily.   potassium chloride 10 MEQ tablet Commonly known as: KLOR-CON Take 10 mEq by mouth 3 (three) times daily.   predniSONE 20 MG tablet Commonly known as: DELTASONE Take 3 tablets once daily for 3 days followed by 2 tablets once daily for 3 days followed by 1 tablet once daily for 3 days and then stop   RABEprazole 20 MG tablet Commonly known as: ACIPHEX Take 20 mg by mouth 2 (two) times daily.   timolol 0.5 % ophthalmic gel-forming Commonly known as: TIMOPTIC-XR Place 1 drop into both eyes daily.   tiZANidine 4 MG tablet Commonly known as: ZANAFLEX Take 2-4 mg by mouth 2 (two) times daily as needed for muscle spasms.   valACYclovir 1000 MG tablet Commonly known as: VALTREX Take 1,000 mg by mouth daily as needed (breakouts).         Follow-up Information    Kirk Ruths, MD. Go on 11/18/2019.   Specialty: Internal Medicine Why: at 4:00pm Contact information: [place] [facility] [address] [place] [address] [phone]               TOTAL DISCHARGE TIME: 17 minutes  [place]  [facility] Pager on [email or web address]  11/07/2019, 5:00 PM

## 2019-11-07 NOTE — Progress Notes (Signed)
Occupational Therapy Treatment Patient Details Name: [name] [name] MRN: [id number] DOB: 08/22/1925 Today's Date: 11/07/2019    History of present illness Pt is a 86 y.o. female with medical history significant for chronic anemia secondary to chronic GI bleed from angiodysplasia, COPD, ex-smoker x30 years, HTN and macular degeneration, recently treated on 10/11 for pneumonia with antibiotics and steroids from which she mostly but not fully recovered, who presented to the emergency room with a 2-day history of shortness of breath and wheezing and dry cough not responding to home bronchodilator treatments.   OT comments  [name] [name] was seen for OT treatment on this date. Upon arrival to room pt standing, transferring back to EOB from [facility]. Pt noted to be dressed in clothing from home, and states per her MD she will DC later this morning. Pt verbalizes excitement to be leaving the hospital, but states "I know I'll need to pace myself when I get home". Pt and provider review energy conservation handout as provided at time of initial OT evaluation. 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 Pt and provider problem solve strategies to support her continued functional independence with tasks, as well as support her safety and energy conservation upon DC from the hospital. OT facilitates reinforcement of prior education on energy conservation strategies including activity pacing, planning of daily schedules, prioritizing meaningful occupations, and attending to body position to maximize energy conservation throughout the day. Falls prevention strategies also reviewed with pt including use of AE for functional mobility. Pt verbalized understanding. Pt making good progress toward goals. Pt continues to benefit from skilled OT services to maximize return to PLOF and minimize risk of future falls,  injury, caregiver burden, and readmission. Will continue to follow POC as written. Discharge recommendation remains appropriate.    Follow Up Recommendations  Home health OT    Equipment Recommendations       Recommendations for Other Services      Precautions / Restrictions Precautions Precautions: Fall Restrictions Weight Bearing Restrictions: No       Mobility Bed Mobility Overal bed mobility: Independent                Transfers Overall transfer level: Needs assistance Equipment used: Rolling walker (2 wheeled) Transfers: Sit to/from Stand Sit to Stand: Supervision         General transfer comment: Pt continues to require at least 1 UE support during functional transfers. She demos improved understanding/use of RW, however is noted to use bed rails/BSC for support during mobility as well.    Balance Overall balance assessment: Needs assistance Sitting-balance support: Feet supported Sitting balance-Leahy Scale: Good     Standing balance support: During functional activity;Single extremity supported Standing balance-Leahy Scale: Fair Standing balance comment: Requires UE support for dynamic standing tasks.                           ADL either performed or assessed with clinical judgement   ADL Overall ADL's : Needs assistance/impaired                                       General ADL Comments: Pt continues to be functionally limited by cardiopulmonary status.She is easily fatigued and becomes SOB with minimal exertion, however, she reports it  is much improved since admission. She continues to require supervision for safety during functional mobility over longer distances. Pt now MOD I for LB ADL management. She is recieved dressed in street clothing. She states that she was able to dress herself modified independently in room upon hearing she would DC today from her doctor..     Vision Baseline Vision/History: Wears  glasses;Macular Degeneration Patient Visual Report: No change from baseline     Perception     Praxis      Cognition Arousal/Alertness: Awake/alert Behavior During Therapy: WFL for tasks assessed/performed Overall Cognitive Status: Within Functional Limits for tasks assessed                                          Exercises Other Exercises Other Exercises: OT facilitates reinforcement of prior education on energy conservation strategies including activity pacing, planning of daily schedules, prioritizing meaningful occupations, and attending to body position to maximize energy conservation throughout the day. Falls prevention strategies also reviewed c pt including use of AE for functional mobility.   Shoulder Instructions       General Comments Pt on RA, O2 sats WFLs, min increased SOB appreciated with mobility.    Pertinent Vitals/ Pain       Pain Assessment: No/denies pain  Home Living                                          Prior Functioning/Environment              Frequency  Min 1X/week        Progress Toward Goals  OT Goals(current goals can now be found in the care plan section)  Progress towards OT goals: Progressing toward goals  Acute Rehab OT Goals Patient Stated Goal: to go home and get back to gardening. OT Goal Formulation: With patient Time For Goal Achievement: 11/19/19 Potential to Achieve Goals: Good  Plan Discharge plan remains appropriate;Frequency remains appropriate    Co-evaluation                 AM-PAC OT "6 Clicks" Daily Activity     Outcome Measure   Help from another person eating meals?: None Help from another person taking care of personal grooming?: None Help from another person toileting, which includes using toliet, bedpan, or urinal?: A Little Help from another person bathing (including washing, rinsing, drying)?: A Little Help from another person to put on and taking off  regular upper body clothing?: None Help from another person to put on and taking off regular lower body clothing?: None 6 Click Score: 22    End of Session Equipment Utilized During Treatment: Rolling walker;Other (comment) (BSC)  OT Visit Diagnosis: Other abnormalities of gait and mobility (R26.89)   Activity Tolerance Patient tolerated treatment well   Patient Left in bed;Other (comment) (Seated EOB with RN in room to administer medications. Pt recieved with bed alarm off.)   Nurse Communication          Time: 4098-1191 OT Time Calculation (min): 23 min  Charges: OT General Charges $OT Visit: 1 Visit OT Treatments $Therapeutic Activity: 23-37 mins  Shara Blazing, M.S., OTR/L Ascom: [phone] 11/07/19, 10:49 AM

## 2019-11-07 NOTE — Discharge Instructions (Signed)

## 2019-11-07 NOTE — Care Management Important Message (Signed)
Important Message  Patient Details  Name: [name] [name] MRN: [id number] Date of Birth: November 09, 1925   Medicare Important Message Given:  Yes     Juliann Pulse A Enoc Getter 11/07/2019, 10:46 AM

## 2019-11-07 NOTE — Progress Notes (Signed)
Spoke to [place], pt's son.  He is on the way to transport patient to discharge location. Shanon Brow is to call me when he arrives at [facility] mall

## 2019-11-09 LAB — CULTURE, BLOOD (ROUTINE X 2)
Culture: NO GROWTH
Culture: NO GROWTH
Special Requests: ADEQUATE
Special Requests: ADEQUATE

## 2019-12-01 ENCOUNTER — Other Ambulatory Visit: Payer: Self-pay

## 2019-12-01 ENCOUNTER — Inpatient Hospital Stay: Payer: Medicare Other | Attending: Oncology

## 2019-12-01 ENCOUNTER — Encounter: Payer: Self-pay | Admitting: Oncology

## 2019-12-01 ENCOUNTER — Inpatient Hospital Stay (HOSPITAL_BASED_OUTPATIENT_CLINIC_OR_DEPARTMENT_OTHER): Payer: Medicare Other | Admitting: Oncology

## 2019-12-01 VITALS — BP 155/71 | HR 80 | Temp 99.1°F | Wt 187.6 lb

## 2019-12-01 DIAGNOSIS — E785 Hyperlipidemia, unspecified: Secondary | ICD-10-CM | POA: Insufficient documentation

## 2019-12-01 DIAGNOSIS — J449 Chronic obstructive pulmonary disease, unspecified: Secondary | ICD-10-CM | POA: Diagnosis not present

## 2019-12-01 DIAGNOSIS — M8580 Other specified disorders of bone density and structure, unspecified site: Secondary | ICD-10-CM | POA: Diagnosis not present

## 2019-12-01 DIAGNOSIS — I10 Essential (primary) hypertension: Secondary | ICD-10-CM | POA: Insufficient documentation

## 2019-12-01 DIAGNOSIS — Z85828 Personal history of other malignant neoplasm of skin: Secondary | ICD-10-CM | POA: Insufficient documentation

## 2019-12-01 DIAGNOSIS — D509 Iron deficiency anemia, unspecified: Secondary | ICD-10-CM | POA: Diagnosis present

## 2019-12-01 DIAGNOSIS — Z87891 Personal history of nicotine dependence: Secondary | ICD-10-CM | POA: Diagnosis not present

## 2019-12-01 DIAGNOSIS — K219 Gastro-esophageal reflux disease without esophagitis: Secondary | ICD-10-CM | POA: Diagnosis not present

## 2019-12-01 DIAGNOSIS — Z79899 Other long term (current) drug therapy: Secondary | ICD-10-CM | POA: Diagnosis not present

## 2019-12-01 DIAGNOSIS — D500 Iron deficiency anemia secondary to blood loss (chronic): Secondary | ICD-10-CM

## 2019-12-01 LAB — CBC WITH DIFFERENTIAL/PLATELET
Abs Immature Granulocytes: 0.03 10*3/uL (ref 0.00–0.07)
Basophils Absolute: 0.1 10*3/uL (ref 0.0–0.1)
Basophils Relative: 2 %
Eosinophils Absolute: 0.6 10*3/uL — ABNORMAL HIGH (ref 0.0–0.5)
Eosinophils Relative: 8 %
HCT: 30.8 % — ABNORMAL LOW (ref 36.0–46.0)
Hemoglobin: 10.1 g/dL — ABNORMAL LOW (ref 12.0–15.0)
Immature Granulocytes: 0 %
Lymphocytes Relative: 13 %
Lymphs Abs: 1 10*3/uL (ref 0.7–4.0)
MCH: 27.4 pg (ref 26.0–34.0)
MCHC: 32.8 g/dL (ref 30.0–36.0)
MCV: 83.7 fL (ref 80.0–100.0)
Monocytes Absolute: 1.1 10*3/uL — ABNORMAL HIGH (ref 0.1–1.0)
Monocytes Relative: 15 %
Neutro Abs: 4.7 10*3/uL (ref 1.7–7.7)
Neutrophils Relative %: 62 %
Platelets: 318 10*3/uL (ref 150–400)
RBC: 3.68 MIL/uL — ABNORMAL LOW (ref 3.87–5.11)
RDW: 16.7 % — ABNORMAL HIGH (ref 11.5–15.5)
WBC: 7.5 10*3/uL (ref 4.0–10.5)
nRBC: 0 % (ref 0.0–0.2)

## 2019-12-01 LAB — FERRITIN: Ferritin: 56 ng/mL (ref 11–307)

## 2019-12-01 LAB — IRON AND TIBC
Iron: 27 ug/dL — ABNORMAL LOW (ref 28–170)
Saturation Ratios: 9 % — ABNORMAL LOW (ref 10.4–31.8)
TIBC: 305 ug/dL (ref 250–450)
UIBC: 278 ug/dL

## 2019-12-01 NOTE — Progress Notes (Signed)
Hematology/Oncology Consult note [facility]  Telephone:([phone] [phone]  Patient Care Team: Kirk Ruths, MD as PCP - General (Internal Medicine)   Name of the patient: [name] [name]  [id number]  Mar 20, 1925   Date of visit: 12/01/19  Diagnosis-iron deficiency anemia  Chief complaint/ Reason for visit-routine follow-up of iron deficiency anemia  Heme/Onc history: Patient is a 86 year old female who was referred to the clinic in October 2017 for chronic iron deficiency anemia with history of diverticulosis. She has been unable to tolerate oral iron supplements due to abdominal cramping and diarrhea. She has required several blood transfusions in the past and received regular IV iron infusions. She has tolerated these without complications. Iron and blood transfusions have been given on a palliative basis with a goal hemoglobin of >10.   March 2018 she underwent an EGD and colonoscopy which did not reveal source of bleeding.  May 2/18 - enteroscopy revealed 3 avms which were ablated. She suffered melena and had a hemoglobin of 6.7 and was hospitalized. At that time, enteroscopy was repeated which was normal. She received a blood transfusion and her hemoglobin was 9.6.   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   08/07/2016 Capsule study showed active bleeding from single spot in proximal jejunum. She underwent push enteroscopy with argon laser therapy but no bleeding was found  Interval history-patient currently reports doing well and denies any complaints at this time.  Denies any dark melanotic stools or bright red blood in her stools.  She does report some fatigue.  Recently hospitalized for pneumonia  ECOG PS- 1 Pain scale- 0   Review of systems- Review of Systems  Constitutional: Positive for malaise/fatigue. Negative for chills, fever and weight loss.  HENT:  Negative for congestion, ear discharge and nosebleeds.   Eyes: Negative for blurred vision.  Respiratory: Negative for cough, hemoptysis, sputum production, shortness of breath and wheezing.   Cardiovascular: Negative for chest pain, palpitations, orthopnea and claudication.  Gastrointestinal: Negative for abdominal pain, blood in stool, constipation, diarrhea, heartburn, melena, nausea and vomiting.  Genitourinary: Negative for dysuria, flank pain, frequency, hematuria and urgency.  Musculoskeletal: Negative for back pain, joint pain and myalgias.  Skin: Negative for rash.  Neurological: Negative for dizziness, tingling, focal weakness, seizures, weakness and headaches.  Endo/Heme/Allergies: Does not bruise/bleed easily.  Psychiatric/Behavioral: Negative for depression and suicidal ideas. The patient does not have insomnia.       Allergies  Allergen Reactions  . Nsaids Hives  . Dextrans Hives  . Fentanyl Itching  . Lipitor [Atorvastatin] Other (See Comments)    Reaction: Muscle pain  . Lovastatin   . Mobic [Meloxicam] Other (See Comments)    Reaction: Mouth and tongue ulcers  . Niacin And Related Itching  . Other   . Polysaccharide K Hives  . Pravachol [Pravastatin Sodium] Other (See Comments)    Reaction: Muscle pain  . Statins Other (See Comments)  . Tolmetin Hives  . Voltaren [Diclofenac Sodium] Other (See Comments)    Reaction: Mouth and tongue ulcers  . Zetia [Ezetimibe] Other (See Comments)    Reaction: Leg pain  . Codeine Nausea And Vomiting  . Niacin Itching     Past Medical History:  Diagnosis Date  . Actinic keratosis   . Anemia   . COPD (chronic obstructive pulmonary disease) ([place])   . Diverticulitis   . GERD (gastroesophageal reflux disease)   . Glaucoma   .  HLD (hyperlipidemia)   . Hypertension   . Iron deficiency   . Rectal bleeding   . Skin cancer    Nose, txted by Dr. Valere Dross at [facility]     Past Surgical History:  Procedure Laterality Date  .  ABDOMINAL HYSTERECTOMY    . APPENDECTOMY    . COLONOSCOPY WITH PROPOFOL N/A 04/14/2016   Procedure: COLONOSCOPY WITH PROPOFOL;  Surgeon: Lucilla Lame, MD;  Location: [facility] ENDOSCOPY;  Service: Endoscopy;  Laterality: N/A;  . ENTEROSCOPY N/A 05/10/2016   Procedure: Push ENTEROSCOPY with pediatric colonoscope;  Surgeon: [name], MD;  Location: [facility] ENDOSCOPY;  Service: Endoscopy;  Laterality: N/A;  . ENTEROSCOPY N/A 05/30/2016   Procedure: push ENTEROSCOPY;  Surgeon: [name], MD;  Location: [facility] ENDOSCOPY;  Service: Endoscopy;  Laterality: N/A;  . ENTEROSCOPY N/A 07/05/2017   Procedure: ENTEROSCOPY;  Surgeon: [name], MD;  Location: [facility] ENDOSCOPY;  Service: Gastroenterology;  Laterality: N/A;  . ENTEROSCOPY N/A 06/21/2018   Procedure: ENTEROSCOPY;  Surgeon: [name], MD;  Location: [facility] ENDOSCOPY;  Service: Gastroenterology;  Laterality: N/A;  . ESOPHAGOGASTRODUODENOSCOPY (EGD) WITH PROPOFOL N/A 08/12/2015   Procedure: ESOPHAGOGASTRODUODENOSCOPY (EGD) WITH PROPOFOL;  Surgeon: Lollie Sails, MD;  Location: [facility] ENDOSCOPY;  Service: Endoscopy;  Laterality: N/A;  . ESOPHAGOGASTRODUODENOSCOPY (EGD) WITH PROPOFOL N/A 04/03/2016   Procedure: ESOPHAGOGASTRODUODENOSCOPY (EGD) WITH PROPOFOL;  Surgeon: Lucilla Lame, MD;  Location: [facility] ENDOSCOPY;  Service: Endoscopy;  Laterality: N/A;  . GIVENS CAPSULE STUDY N/A 04/28/2016   Procedure: GIVENS CAPSULE STUDY;  Surgeon: [name], MD;  Location: [facility] ENDOSCOPY;  Service: Endoscopy;  Laterality: N/A;  . GIVENS CAPSULE STUDY N/A 06/21/2017   Procedure: GIVENS CAPSULE STUDY;  Surgeon: [name], MD;  Location: [facility] ENDOSCOPY;  Service: Gastroenterology;  Laterality: N/A;  . JOINT REPLACEMENT      Social History   Socioeconomic History  . Marital status: Widowed    Spouse name: Not on file  . Number of children: Not on file  . Years of education: Not on file  . Highest education level: Not on file  Occupational History  . Not on file  Tobacco  Use  . Smoking status: Former Smoker    Types: Cigarettes  . Smokeless tobacco: Never Used  . Tobacco comment: over 30 years  Vaping Use  . Vaping Use: Never used  Substance and Sexual Activity  . Alcohol use: Yes    Alcohol/week: 1.0 standard drink    Types: 1 Glasses of wine per week    Comment: glass wine every 2 months  . Drug use: No  . Sexual activity: Not Currently    Birth control/protection: Post-menopausal  Other Topics Concern  . Not on file  Social History Narrative  . Not on file   Social Determinants of Health   Financial Resource Strain:   . Difficulty of Paying Living Expenses: Not on file  Food Insecurity:   . Worried About Charity fundraiser in the Last Year: Not on file  . Ran Out of Food in the Last Year: Not on file  Transportation Needs:   . Lack of Transportation (Medical): Not on file  . Lack of Transportation (Non-Medical): Not on file  Physical Activity:   . Days of Exercise per Week: Not on file  . Minutes of Exercise per Session: Not on file  Stress:   . Feeling of Stress : Not on file  Social Connections:   . Frequency of Communication with Friends and Family: Not on file  . Frequency of  Social Gatherings with Friends and Family: Not on file  . Attends Religious Services: Not on file  . Active Member of Clubs or Organizations: Not on file  . Attends Archivist Meetings: Not on file  . Marital Status: Not on file  Intimate Partner Violence:   . Fear of Current or Ex-Partner: Not on file  . Emotionally Abused: Not on file  . Physically Abused: Not on file  . Sexually Abused: Not on file    Family History  Problem Relation Age of Onset  . CAD Mother   . CAD Father   . CAD Brother      Current Outpatient Medications:  .  acetaminophen (TYLENOL) 500 MG tablet, Take 500-1,000 mg by mouth every 6 (six) hours as needed for mild pain, moderate pain or fever., Disp: , Rfl:  .  acidophilus (RISAQUAD) CAPS capsule, Take 1 capsule  by mouth daily., Disp: 14 capsule, Rfl: 0 .  albuterol (PROVENTIL HFA;VENTOLIN HFA) 108 (90 Base) MCG/ACT inhaler, Inhale 2 puffs into the lungs every 4 (four) hours as needed for wheezing or shortness of breath., Disp: , Rfl:  .  chlorthalidone (HYGROTON) 25 MG tablet, Take 0.5 tablets (12.5 mg total) by mouth daily. May resume on 11/1 if diarrhea resolves, Disp: , Rfl:  .  Coenzyme Q10 10 MG capsule, Take 10 mg by mouth daily. , Disp: , Rfl:  .  Cyanocobalamin (B-12) 1000 MCG CAPS, Take 2,000 Units by mouth daily. , Disp: , Rfl:  .  cycloSPORINE (RESTASIS) 0.05 % ophthalmic emulsion, Place 1 drop into both eyes 2 (two) times daily., Disp: , Rfl:  .  escitalopram (LEXAPRO) 20 MG tablet, Take 20 mg by mouth daily., Disp: , Rfl:  .  fluticasone (FLOVENT HFA) 110 MCG/ACT inhaler, Inhale 2 puffs into the lungs 2 (two) times daily., Disp: , Rfl:  .  gabapentin (NEURONTIN) 300 MG capsule, Take 300 mg by mouth 2 (two) times daily., Disp: , Rfl:  .  guaiFENesin (MUCINEX) 600 MG 12 hr tablet, Take 600 mg by mouth 2 (two) times daily as needed for cough or to loosen phlegm. , Disp: , Rfl:  .  hydrocortisone 2.5 % cream, Apply topically 2 (two) times daily as needed (Rash)., Disp: 28 g, Rfl: 3 .  Ipratropium-Albuterol (COMBIVENT) 20-100 MCG/ACT AERS respimat, Inhale 2 puffs into the lungs in the morning, at noon, in the evening, and at bedtime., Disp: 1 each, Rfl: 1 .  losartan (COZAAR) 50 MG tablet, Take 50 mg by mouth daily., Disp: , Rfl:  .  mometasone (ELOCON) 0.1 % lotion, Apply 1 application topically daily as needed (skin irritation). , Disp: , Rfl:  .  Multiple Vitamins-Minerals (OCUVITE PRESERVISION PO), Take 1 tablet by mouth 2 (two) times daily., Disp: , Rfl:  .  potassium chloride (KLOR-CON) 10 MEQ tablet, Take 10 mEq by mouth 3 (three) times daily., Disp: , Rfl:  .  predniSONE (DELTASONE) 20 MG tablet, Take 3 tablets once daily for 3 days followed by 2 tablets once daily for 3 days followed by 1  tablet once daily for 3 days and then stop, Disp: 18 tablet, Rfl: 0 .  RABEprazole (ACIPHEX) 20 MG tablet, Take 20 mg by mouth 2 (two) times daily., Disp: , Rfl:  .  timolol (TIMOPTIC-XR) 0.5 % ophthalmic gel-forming, Place 1 drop into both eyes daily., Disp: , Rfl:  .  tiZANidine (ZANAFLEX) 4 MG tablet, Take 2-4 mg by mouth 2 (two) times daily as needed for muscle  spasms., Disp: , Rfl:  .  valACYclovir (VALTREX) 1000 MG tablet, Take 1,000 mg by mouth daily as needed (breakouts). , Disp: , Rfl:  .  hydrochlorothiazide (HYDRODIURIL) 12.5 MG tablet, Take 12.5 mg by mouth daily., Disp: , Rfl:  .  saccharomyces boulardii (FLORASTOR) 250 MG capsule, Take by mouth., Disp: , Rfl:  No current facility-administered medications for this visit.  Facility-Administered Medications Ordered in Other Visits:  .  0.9 %  sodium chloride infusion, , Intravenous, Continuous, Sindy Guadeloupe, MD, Last Rate: 0 mL/hr at 10/08/17 1518, New Bag at 06/21/18 0952  Physical exam:  Vitals:   12/01/19 1318  BP: (!) 155/71  Pulse: 80  Temp: 99.1 F (37.3 C)  TempSrc: Tympanic  SpO2: 98%  Weight: 187 lb 9.6 oz (85.1 kg)   Physical Exam Constitutional:      General: She is not in acute distress. Cardiovascular:     Rate and Rhythm: Normal rate and regular rhythm.     Heart sounds: Normal heart sounds.  Pulmonary:     Effort: Pulmonary effort is normal.     Breath sounds: Normal breath sounds.  Abdominal:     General: Bowel sounds are normal.     Palpations: Abdomen is soft.  Skin:    General: Skin is warm and dry.  Neurological:     Mental Status: She is alert and oriented to person, place, and time.      CMP Latest Ref Rng & Units 11/07/2019  Glucose 70 - 99 mg/dL 103(H)  BUN 8 - 23 mg/dL 19  Creatinine 0.44 - 1.00 mg/dL 0.59  Sodium 135 - 145 mmol/L 131(L)  Potassium 3.5 - 5.1 mmol/L 3.8  Chloride 98 - 111 mmol/L 97(L)  CO2 22 - 32 mmol/L 25  Calcium 8.9 - 10.3 mg/dL 8.6(L)  Total Protein 6.5 -  8.1 g/dL -  Total Bilirubin 0.3 - 1.2 mg/dL -  Alkaline Phos 38 - 126 U/L -  AST 15 - 41 U/L -  ALT 0 - 44 U/L -   CBC Latest Ref Rng & Units 12/01/2019  WBC 4.0 - 10.5 K/uL 7.5  Hemoglobin 12.0 - 15.0 g/dL 10.1(L)  Hematocrit 36 - 46 % 30.8(L)  Platelets 150 - 400 K/uL 318    No images are attached to the encounter.  DG Chest 2 View  Result Date: 11/04/2019 CLINICAL DATA:  86 year old female with shortness of breath. EXAM: CHEST - 2 VIEW COMPARISON:  Chest radiograph dated 10/18/2015. FINDINGS: Probable trace bilateral pleural effusions. Minimal left lung base atelectasis or infiltrate. There is background of mild chronic interstitial coarsening and bronchitic changes. No pneumothorax. The cardiac silhouette is within limits. Atherosclerotic calcification of the aorta. Osteopenia with degenerative changes of the spine. No acute osseous pathology. IMPRESSION: 1. Probable trace bilateral pleural effusions. 2. Minimal left lung base atelectasis or infiltrate. Electronically Signed   By: Anner Crete M.D.   On: 11/04/2019 17:28     Assessment and plan- Patient is a 86 y.o. female with iron deficiency anemia here for routine follow-up  Hemoglobin presently is stable around 10.  Ferritin levels are normal at 56 TIBC is normal at 305 although iron saturation is low at 9%.  I am holding off on giving her IV iron at this time.  She is also recovering from a recent bout of pneumonia.  Repeat CBC ferritin and iron studies in 3 in 6 months and I will see her back in 6 months   Visit Diagnosis 1.  Iron deficiency anemia due to chronic blood loss      Dr. Randa Evens, MD, MPH [facility] at [facility] [phone] 12/01/2019 5:21 PM

## 2020-01-28 ENCOUNTER — Other Ambulatory Visit: Payer: Self-pay

## 2020-01-28 ENCOUNTER — Inpatient Hospital Stay: Payer: Medicare Other | Attending: Oncology

## 2020-01-28 DIAGNOSIS — D500 Iron deficiency anemia secondary to blood loss (chronic): Secondary | ICD-10-CM | POA: Diagnosis present

## 2020-01-28 DIAGNOSIS — Z79899 Other long term (current) drug therapy: Secondary | ICD-10-CM | POA: Diagnosis not present

## 2020-01-28 LAB — CBC
HCT: 28.4 % — ABNORMAL LOW (ref 36.0–46.0)
Hemoglobin: 9 g/dL — ABNORMAL LOW (ref 12.0–15.0)
MCH: 24.1 pg — ABNORMAL LOW (ref 26.0–34.0)
MCHC: 31.7 g/dL (ref 30.0–36.0)
MCV: 76.1 fL — ABNORMAL LOW (ref 80.0–100.0)
Platelets: 431 10*3/uL — ABNORMAL HIGH (ref 150–400)
RBC: 3.73 MIL/uL — ABNORMAL LOW (ref 3.87–5.11)
RDW: 15.4 % (ref 11.5–15.5)
WBC: 9.2 10*3/uL (ref 4.0–10.5)
nRBC: 0 % (ref 0.0–0.2)

## 2020-01-28 LAB — IRON AND TIBC
Iron: 12 ug/dL — ABNORMAL LOW (ref 28–170)
Saturation Ratios: 3 % — ABNORMAL LOW (ref 10.4–31.8)
TIBC: 399 ug/dL (ref 250–450)
UIBC: 387 ug/dL

## 2020-01-28 LAB — FERRITIN: Ferritin: 19 ng/mL (ref 11–307)

## 2020-02-02 ENCOUNTER — Inpatient Hospital Stay: Payer: Medicare Other

## 2020-02-02 ENCOUNTER — Other Ambulatory Visit: Payer: Self-pay | Admitting: Oncology

## 2020-02-02 VITALS — BP 154/70 | HR 82 | Temp 97.0°F | Resp 18

## 2020-02-02 DIAGNOSIS — D500 Iron deficiency anemia secondary to blood loss (chronic): Secondary | ICD-10-CM

## 2020-02-02 LAB — CBC
HCT: 27.8 % — ABNORMAL LOW (ref 36.0–46.0)
Hemoglobin: 8.8 g/dL — ABNORMAL LOW (ref 12.0–15.0)
MCH: 23.8 pg — ABNORMAL LOW (ref 26.0–34.0)
MCHC: 31.7 g/dL (ref 30.0–36.0)
MCV: 75.1 fL — ABNORMAL LOW (ref 80.0–100.0)
Platelets: 441 10*3/uL — ABNORMAL HIGH (ref 150–400)
RBC: 3.7 MIL/uL — ABNORMAL LOW (ref 3.87–5.11)
RDW: 15.4 % (ref 11.5–15.5)
WBC: 10.2 10*3/uL (ref 4.0–10.5)
nRBC: 0 % (ref 0.0–0.2)

## 2020-02-02 LAB — IRON AND TIBC
Iron: 14 ug/dL — ABNORMAL LOW (ref 28–170)
Saturation Ratios: 4 % — ABNORMAL LOW (ref 10.4–31.8)
TIBC: 402 ug/dL (ref 250–450)
UIBC: 388 ug/dL

## 2020-02-02 LAB — FERRITIN: Ferritin: 17 ng/mL (ref 11–307)

## 2020-02-02 MED ORDER — SODIUM CHLORIDE 0.9 % IV SOLN
Freq: Once | INTRAVENOUS | Status: AC
  Filled 2020-02-02: qty 250

## 2020-02-02 MED ORDER — SODIUM CHLORIDE 0.9 % IV SOLN
510.0000 mg | INTRAVENOUS | Status: DC
  Administered 2020-02-02: 510 mg via INTRAVENOUS
  Filled 2020-02-02: qty 510

## 2020-02-02 NOTE — Progress Notes (Signed)
Pt tolerated Feraheme infusion well with no signs of complications. VSS. Pt stable for discharge.  [name] [name]  

## 2020-02-10 ENCOUNTER — Other Ambulatory Visit: Payer: Self-pay

## 2020-02-10 ENCOUNTER — Inpatient Hospital Stay: Payer: Medicare Other | Attending: Oncology

## 2020-02-10 ENCOUNTER — Inpatient Hospital Stay: Payer: Medicare Other

## 2020-02-10 VITALS — BP 139/70 | HR 71 | Temp 98.6°F | Resp 20

## 2020-02-10 DIAGNOSIS — D500 Iron deficiency anemia secondary to blood loss (chronic): Secondary | ICD-10-CM | POA: Diagnosis not present

## 2020-02-10 DIAGNOSIS — Z79899 Other long term (current) drug therapy: Secondary | ICD-10-CM | POA: Insufficient documentation

## 2020-02-10 MED ORDER — SODIUM CHLORIDE 0.9 % IV SOLN
510.0000 mg | INTRAVENOUS | Status: DC
  Administered 2020-02-10: 510 mg via INTRAVENOUS
  Filled 2020-02-10: qty 510

## 2020-02-10 MED ORDER — SODIUM CHLORIDE 0.9 % IV SOLN
Freq: Once | INTRAVENOUS | Status: AC
Start: 2020-02-10 — End: 2020-02-10
  Filled 2020-02-10: qty 250

## 2020-02-10 NOTE — Progress Notes (Signed)
Stable at discharge 

## 2020-04-21 ENCOUNTER — Other Ambulatory Visit: Payer: Self-pay

## 2020-04-21 ENCOUNTER — Encounter: Payer: Medicare Other | Attending: Pulmonary Disease

## 2020-04-21 DIAGNOSIS — J449 Chronic obstructive pulmonary disease, unspecified: Secondary | ICD-10-CM | POA: Insufficient documentation

## 2020-04-21 NOTE — Progress Notes (Signed)
Virtual Visit completed. Patient informed on EP and RD appointment and 6 Minute walk test. Patient also informed of patient health questionnaires on My Chart. Patient Verbalizes understanding. Visit diagnosis can be found in [facility] 04/05/2020.

## 2020-05-03 ENCOUNTER — Other Ambulatory Visit: Payer: Self-pay

## 2020-05-03 VITALS — Ht 67.0 in | Wt 183.9 lb

## 2020-05-03 DIAGNOSIS — J449 Chronic obstructive pulmonary disease, unspecified: Secondary | ICD-10-CM

## 2020-05-03 NOTE — Patient Instructions (Signed)
Patient Instructions  Patient Details  Name: [name] [name] MRN: [id number] Date of Birth: 08-13-1925 Referring Provider:  Ottie Glazier, MD  Below are your personal goals for exercise, nutrition, and risk factors. Our goal is to help you stay on track towards obtaining and maintaining these goals. We will be discussing your progress on these goals with you throughout the program.  Initial Exercise Prescription:  Initial Exercise Prescription - 05/03/20 1700      Date of Initial Exercise RX and Referring Provider   Date 05/03/20    Referring Provider Aleskerov      Recumbant Bike   Level 1    RPM 60    Minutes 15    METs 1      NuStep   Level 1    SPM 80    Minutes 15    METs 1      Arm Ergometer   Level 1    RPM 25    Minutes 15    METs 1      Track   Laps 7    Minutes 15    METs 1      Prescription Details   Frequency (times per week) 3    Duration Progress to 30 minutes of continuous aerobic without signs/symptoms of physical distress      Intensity   THRR 40-80% of Max Heartrate 99-117    Ratings of Perceived Exertion 11-13      Resistance Training   Training Prescription Yes    Weight 2 lb    Reps 10-15           Exercise Goals: Frequency: Be able to perform aerobic exercise two to three times per week in program working toward 2-5 days per week of home exercise.  Intensity: Work with a perceived exertion of 11 (fairly light) - 15 (hard) while following your exercise prescription.  We will make changes to your prescription with you as you progress through the program.   Duration: Be able to do 30 to 45 minutes of continuous aerobic exercise in addition to a 5 minute warm-up and a 5 minute cool-down routine.   Nutrition Goals: Your personal nutrition goals will be established when you do your nutrition analysis with the dietician.  The following are general nutrition guidelines to follow: Cholesterol < 200mg /day Sodium <  1500mg /day Fiber: Women over 50 yrs - 21 grams per day  Personal Goals:  Personal Goals and Risk Factors at Admission - 05/03/20 1712      Core Components/Risk Factors/Patient Goals on Admission    Weight Management Yes;Weight Loss    Intervention Weight Management: Develop a combined nutrition and exercise program designed to reach desired caloric intake, while maintaining appropriate intake of nutrient and fiber, sodium and fats, and appropriate energy expenditure required for the weight goal.;Weight Management: Provide education and appropriate resources to help participant work on and attain dietary goals.;Weight Management/Obesity: Establish reasonable short term and long term weight goals.    Expected Outcomes Short Term: Continue to assess and modify interventions until short term weight is achieved;Long Term: Adherence to nutrition and physical activity/exercise program aimed toward attainment of established weight goal;Weight Loss: Understanding of general recommendations for a balanced deficit meal plan, which promotes 1-2 lb weight loss per week and includes a negative energy balance of [phone] kcal/d;Understanding recommendations for meals to include 15-35% energy as protein, 25-35% energy from fat, 35-60% energy from carbohydrates, less than 200mg  of dietary cholesterol, 20-35 gm of total fiber  daily;Understanding of distribution of calorie intake throughout the day with the consumption of 4-5 meals/snacks    Improve shortness of breath with ADL's Yes    Intervention Provide education, individualized exercise plan and daily activity instruction to help decrease symptoms of SOB with activities of daily living.    Expected Outcomes Short Term: Improve cardiorespiratory fitness to achieve a reduction of symptoms when performing ADLs;Long Term: Be able to perform more ADLs without symptoms or delay the onset of symptoms    Hypertension Yes    Intervention Provide education on lifestyle  modifcations including regular physical activity/exercise, weight management, moderate sodium restriction and increased consumption of fresh fruit, vegetables, and low fat dairy, alcohol moderation, and smoking cessation.;Monitor prescription use compliance.    Expected Outcomes Short Term: Continued assessment and intervention until BP is < 140/53mm HG in hypertensive participants. < 130/51mm HG in hypertensive participants with diabetes, heart failure or chronic kidney disease.;Long Term: Maintenance of blood pressure at goal levels.    Lipids Yes    Intervention Provide education and support for participant on nutrition & aerobic/resistive exercise along with prescribed medications to achieve LDL 70mg , HDL >40mg .    Expected Outcomes Short Term: Participant states understanding of desired cholesterol values and is compliant with medications prescribed. Participant is following exercise prescription and nutrition guidelines.;Long Term: Cholesterol controlled with medications as prescribed, with individualized exercise RX and with personalized nutrition plan. Value goals: LDL < 70mg , HDL > 40 mg.           Tobacco Use Initial Evaluation: Social History   Tobacco Use  Smoking Status Former Smoker  . Packs/day: 2.00  . Years: 20.00  . Pack years: 40.00  . Types: Cigarettes  . Quit date: 04/22/1979  . Years since quitting: 41.0  Smokeless Tobacco Never Used    Exercise Goals and Review:  Exercise Goals    Row Name 05/03/20 1709             Exercise Goals   Increase Physical Activity Yes       Intervention Provide advice, education, support and counseling about physical activity/exercise needs.;Develop an individualized exercise prescription for aerobic and resistive training based on initial evaluation findings, risk stratification, comorbidities and participant's personal goals.       Expected Outcomes Short Term: Attend rehab on a regular basis to increase amount of physical  activity.;Long Term: Add in home exercise to make exercise part of routine and to increase amount of physical activity.;Long Term: Exercising regularly at least 3-5 days a week.       Increase Strength and Stamina Yes       Intervention Provide advice, education, support and counseling about physical activity/exercise needs.;Develop an individualized exercise prescription for aerobic and resistive training based on initial evaluation findings, risk stratification, comorbidities and participant's personal goals.       Expected Outcomes Short Term: Increase workloads from initial exercise prescription for resistance, speed, and METs.;Short Term: Perform resistance training exercises routinely during rehab and add in resistance training at home;Long Term: Improve cardiorespiratory fitness, muscular endurance and strength as measured by increased METs and functional capacity (6MWT)       Able to understand and use rate of perceived exertion (RPE) scale Yes       Intervention Provide education and explanation on how to use RPE scale       Expected Outcomes Short Term: Able to use RPE daily in rehab to express subjective intensity level;Long Term:  Able to use RPE to  guide intensity level when exercising independently       Able to understand and use Dyspnea scale Yes       Intervention Provide education and explanation on how to use Dyspnea scale       Expected Outcomes Short Term: Able to use Dyspnea scale daily in rehab to express subjective sense of shortness of breath during exertion;Long Term: Able to use Dyspnea scale to guide intensity level when exercising independently       Knowledge and understanding of Target Heart Rate Range (THRR) Yes       Intervention Provide education and explanation of THRR including how the numbers were predicted and where they are located for reference       Expected Outcomes Short Term: Able to state/look up THRR;Short Term: Able to use daily as guideline for intensity in  rehab;Long Term: Able to use THRR to govern intensity when exercising independently       Able to check pulse independently Yes       Intervention Provide education and demonstration on how to check pulse in carotid and radial arteries.;Review the importance of being able to check your own pulse for safety during independent exercise       Expected Outcomes Short Term: Able to explain why pulse checking is important during independent exercise;Long Term: Able to check pulse independently and accurately       Understanding of Exercise Prescription Yes       Intervention Provide education, explanation, and written materials on patient's individual exercise prescription       Expected Outcomes Short Term: Able to explain program exercise prescription;Long Term: Able to explain home exercise prescription to exercise independently              Copy of goals given to participant.

## 2020-05-03 NOTE — Progress Notes (Signed)
Pulmonary Individual Treatment Plan  Patient Details  Name: Rakesha Mileto MRN: [id number] Date of Birth: 28-Jan-1925 Referring Provider:   Flowsheet Row Pulmonary Rehab from 05/03/2020 in [facility] Cardiac and Pulmonary Rehab  Referring Provider Aleskerov      Initial Encounter Date:  Flowsheet Row Pulmonary Rehab from 05/03/2020 in [facility] Cardiac and Pulmonary Rehab  Date 05/03/20      Visit Diagnosis: Chronic obstructive pulmonary disease, unspecified COPD type ([place])  Patient's Home Medications on Admission:  Current Outpatient Medications:  .  acetaminophen (TYLENOL) 500 MG tablet, Take 500-1,000 mg by mouth every 6 (six) hours as needed for mild pain, moderate pain or fever., Disp: , Rfl:  .  acidophilus (RISAQUAD) CAPS capsule, Take 1 capsule by mouth daily. (Patient not taking: Reported on 04/21/2020), Disp: 14 capsule, Rfl: 0 .  albuterol (PROVENTIL HFA;VENTOLIN HFA) 108 (90 Base) MCG/ACT inhaler, Inhale 2 puffs into the lungs every 4 (four) hours as needed for wheezing or shortness of breath., Disp: , Rfl:  .  budesonide (PULMICORT) 0.25 MG/2ML nebulizer solution, Inhale into the lungs., Disp: , Rfl:  .  chlorthalidone (HYGROTON) 25 MG tablet, Take 0.5 tablets (12.5 mg total) by mouth daily. May resume on 11/1 if diarrhea resolves, Disp: , Rfl:  .  Coenzyme Q10 10 MG capsule, Take 10 mg by mouth daily. , Disp: , Rfl:  .  Cyanocobalamin (B-12) 1000 MCG CAPS, Take 2,000 Units by mouth daily. , Disp: , Rfl:  .  cycloSPORINE (RESTASIS) 0.05 % ophthalmic emulsion, Place 1 drop into both eyes 2 (two) times daily., Disp: , Rfl:  .  escitalopram (LEXAPRO) 20 MG tablet, Take 20 mg by mouth daily., Disp: , Rfl:  .  fluticasone (FLONASE) 50 MCG/ACT nasal spray, Place into the nose., Disp: , Rfl:  .  fluticasone (FLOVENT HFA) 110 MCG/ACT inhaler, Inhale 2 puffs into the lungs 2 (two) times daily. (Patient not taking: Reported on 04/21/2020), Disp: , Rfl:  .  gabapentin (NEURONTIN) 300 MG  capsule, Take 300 mg by mouth 2 (two) times daily., Disp: , Rfl:  .  guaiFENesin (MUCINEX) 600 MG 12 hr tablet, Take 600 mg by mouth 2 (two) times daily as needed for cough or to loosen phlegm.  (Patient not taking: Reported on 04/21/2020), Disp: , Rfl:  .  hydrochlorothiazide (HYDRODIURIL) 12.5 MG tablet, Take 12.5 mg by mouth daily., Disp: , Rfl:  .  hydrocortisone 2.5 % cream, Apply topically 2 (two) times daily as needed (Rash). (Patient not taking: Reported on 04/21/2020), Disp: 28 g, Rfl: 3 .  Ipratropium-Albuterol (COMBIVENT) 20-100 MCG/ACT AERS respimat, Inhale 2 puffs into the lungs in the morning, at noon, in the evening, and at bedtime. (Patient not taking: Reported on 04/21/2020), Disp: 1 each, Rfl: 1 .  ipratropium-albuterol (DUONEB) 0.5-2.5 (3) MG/3ML SOLN, Inhale into the lungs., Disp: , Rfl:  .  Loratadine 10 MG CAPS, Take 1 tablet by mouth daily., Disp: , Rfl:  .  losartan (COZAAR) 50 MG tablet, Take 50 mg by mouth daily., Disp: , Rfl:  .  mometasone (ELOCON) 0.1 % lotion, Apply 1 application topically daily as needed (skin irritation).  (Patient not taking: Reported on 04/21/2020), Disp: , Rfl:  .  Multiple Vitamins-Minerals (OCUVITE PRESERVISION PO), Take 1 tablet by mouth 2 (two) times daily., Disp: , Rfl:  .  potassium chloride (KLOR-CON) 10 MEQ tablet, Take 10 mEq by mouth 3 (three) times daily., Disp: , Rfl:  .  predniSONE (DELTASONE) 20 MG tablet, Take 3 tablets once  daily for 3 days followed by 2 tablets once daily for 3 days followed by 1 tablet once daily for 3 days and then stop (Patient not taking: Reported on 04/21/2020), Disp: 18 tablet, Rfl: 0 .  RABEprazole (ACIPHEX) 20 MG tablet, Take 20 mg by mouth 2 (two) times daily., Disp: , Rfl:  .  saccharomyces boulardii (FLORASTOR) 250 MG capsule, Take by mouth. (Patient not taking: Reported on 04/21/2020), Disp: , Rfl:  .  timolol (TIMOPTIC-XR) 0.5 % ophthalmic gel-forming, Place 1 drop into both eyes daily., Disp: , Rfl:  .   tiZANidine (ZANAFLEX) 4 MG tablet, Take 2-4 mg by mouth 2 (two) times daily as needed for muscle spasms., Disp: , Rfl:  .  valACYclovir (VALTREX) 1000 MG tablet, Take 1,000 mg by mouth daily as needed (breakouts).  (Patient not taking: Reported on 04/21/2020), Disp: , Rfl:  No current facility-administered medications for this visit.  Facility-Administered Medications Ordered in Other Visits:  .  0.9 %  sodium chloride infusion, , Intravenous, Continuous, Sindy Guadeloupe, MD, Last Rate: 0 mL/hr at 10/08/17 1518, New Bag at 06/21/18 4268  Past Medical History: Past Medical History:  Diagnosis Date  . Actinic keratosis   . Anemia   . COPD (chronic obstructive pulmonary disease) ([place])   . Diverticulitis   . GERD (gastroesophageal reflux disease)   . Glaucoma   . HLD (hyperlipidemia)   . Hypertension   . Iron deficiency   . Rectal bleeding   . Skin cancer    Nose, txted by Dr. Valere Dross at [facility]    Tobacco Use: Social History   Tobacco Use  Smoking Status Former Smoker  . Packs/day: 2.00  . Years: 20.00  . Pack years: 40.00  . Types: Cigarettes  . Quit date: 04/22/1979  . Years since quitting: 41.0  Smokeless Tobacco Never Used    Labs: Recent Review Flowsheet Data   There is no flowsheet data to display.      Pulmonary Assessment Scores:  Pulmonary Assessment Scores    Row Name 05/03/20 1711         ADL UCSD   SOB Score total 70     Rest 0     Walk 2     Stairs 5     Bath 0     Dress 3     Shop 5           CAT Score   CAT Score 20            UCSD: Self-administered rating of dyspnea associated with activities of daily living (ADLs) 6-point scale (0 = "not at all" to 5 = "maximal or unable to do because of breathlessness")  Scoring Scores range from 0 to 120.  Minimally important difference is 5 units  CAT: CAT can identify the health impairment of COPD patients and is better correlated with disease progression.  CAT has a scoring range of zero to 40.  The CAT score is classified into four groups of low (less than 10), medium (10 - 20), high (21-30) and very high (31-40) based on the impact level of disease on health status. A CAT score over 10 suggests significant symptoms.  A worsening CAT score could be explained by an exacerbation, poor medication adherence, poor inhaler technique, or progression of COPD or comorbid conditions.  CAT MCID is 2 points  mMRC: mMRC (Modified Medical Research Council) Dyspnea Scale is used to assess the degree of baseline functional disability in patients of respiratory  disease due to dyspnea. No minimal important difference is established. A decrease in score of 1 point or greater is considered a positive change.   Pulmonary Function Assessment:  Pulmonary Function Assessment - 04/21/20 1544      Breath   Shortness of Breath Yes;Limiting activity           Exercise Target Goals: Exercise Program Goal: Individual exercise prescription set using results from initial 6 min walk test and THRR while considering  patient's activity barriers and safety.   Exercise Prescription Goal: Initial exercise prescription builds to 30-45 minutes a day of aerobic activity, 2-3 days per week.  Home exercise guidelines will be given to patient during program as part of exercise prescription that the participant will acknowledge.  Education: Aerobic Exercise: - Group verbal and visual presentation on the components of exercise prescription. Introduces F.I.T.T principle from ACSM for exercise prescriptions.  Reviews F.I.T.T. principles of aerobic exercise including progression. Written material given at graduation.   Education: Resistance Exercise: - Group verbal and visual presentation on the components of exercise prescription. Introduces F.I.T.T principle from ACSM for exercise prescriptions  Reviews F.I.T.T. principles of resistance exercise including progression. Written material given at graduation.    Education:  Exercise & Equipment Safety: - Individual verbal instruction and demonstration of equipment use and safety with use of the equipment. Flowsheet Row Pulmonary Rehab from 05/03/2020 in [facility] Cardiac and Pulmonary Rehab  Date 05/03/20  Educator AS  Instruction Review Code 1- Verbalizes Understanding      Education: Exercise Physiology & General Exercise Guidelines: - Group verbal and written instruction with models to review the exercise physiology of the cardiovascular system and associated critical values. Provides general exercise guidelines with specific guidelines to those with heart or lung disease.    Education: Flexibility, Balance, Mind/Body Relaxation: - Group verbal and visual presentation with interactive activity on the components of exercise prescription. Introduces F.I.T.T principle from ACSM for exercise prescriptions. Reviews F.I.T.T. principles of flexibility and balance exercise training including progression. Also discusses the mind body connection.  Reviews various relaxation techniques to help reduce and manage stress (i.e. Deep breathing, progressive muscle relaxation, and visualization). Balance handout provided to take home. Written material given at graduation.   Activity Barriers & Risk Stratification:   6 Minute Walk:  6 Minute Walk    Row Name 05/03/20 1703         6 Minute Walk   Phase Initial     Distance 300 feet     Walk Time 3 minutes     # of Rest Breaks 3     MPH 1.13     METS 1     RPE 13     Perceived Dyspnea  3     VO2 Peak 1.37     Symptoms Yes (comment)     Comments SOB     Resting HR 80 bpm     Resting BP 136/64     Resting Oxygen Saturation  95 %     Exercise Oxygen Saturation  during 6 min walk 89 %     Max Ex. HR 109 bpm     Max Ex. BP 190/80     2 Minute Post BP 154/86           Interval HR   1 Minute HR 106     2 Minute HR 97     3 Minute HR 105     4 Minute HR 108  5 Minute HR 104     6 Minute HR 109     2 Minute Post  HR 97     Interval Heart Rate? Yes           Interval Oxygen   Interval Oxygen? Yes     Baseline Oxygen Saturation % 95 %     1 Minute Oxygen Saturation % 91 %     1 Minute Liters of Oxygen 0 L     2 Minute Oxygen Saturation % 90 %     2 Minute Liters of Oxygen 0 L     3 Minute Oxygen Saturation % 94 %     3 Minute Liters of Oxygen 0 L     4 Minute Oxygen Saturation % 91 %     4 Minute Liters of Oxygen 0 L     5 Minute Oxygen Saturation % 93 %     5 Minute Liters of Oxygen 0 L     6 Minute Oxygen Saturation % 89 %     6 Minute Liters of Oxygen 0 L     2 Minute Post Oxygen Saturation % 93 %     2 Minute Post Liters of Oxygen 0 L           Oxygen Initial Assessment:  Oxygen Initial Assessment - 04/21/20 1543      Home Oxygen   Home Oxygen Device None    Sleep Oxygen Prescription None    Home Exercise Oxygen Prescription None    Home Resting Oxygen Prescription None      Initial 6 min Walk   Oxygen Used None      Program Oxygen Prescription   Program Oxygen Prescription None      Intervention   Short Term Goals To learn and understand importance of monitoring SPO2 with pulse oximeter and demonstrate accurate use of the pulse oximeter.;To learn and understand importance of maintaining oxygen saturations>88%;To learn and demonstrate proper pursed lip breathing techniques or other breathing techniques.;To learn and demonstrate proper use of respiratory medications    Long  Term Goals Verbalizes importance of monitoring SPO2 with pulse oximeter and return demonstration;Maintenance of O2 saturations>88%;Exhibits proper breathing techniques, such as pursed lip breathing or other method taught during program session;Compliance with respiratory medication;Demonstrates proper use of MDI's           Oxygen Re-Evaluation:   Oxygen Discharge (Final Oxygen Re-Evaluation):   Initial Exercise Prescription:  Initial Exercise Prescription - 05/03/20 1700      Date of Initial  Exercise RX and Referring Provider   Date 05/03/20    Referring Provider Aleskerov      Recumbant Bike   Level 1    RPM 60    Minutes 15    METs 1      NuStep   Level 1    SPM 80    Minutes 15    METs 1      Arm Ergometer   Level 1    RPM 25    Minutes 15    METs 1      Track   Laps 7    Minutes 15    METs 1      Prescription Details   Frequency (times per week) 3    Duration Progress to 30 minutes of continuous aerobic without signs/symptoms of physical distress      Intensity   THRR 40-80% of Max Heartrate 99-117    Ratings of Perceived  Exertion 11-13      Resistance Training   Training Prescription Yes    Weight 2 lb    Reps 10-15           Perform Capillary Blood Glucose checks as needed.  Exercise Prescription Changes:  Exercise Prescription Changes    Row Name 05/03/20 1700             Response to Exercise   Blood Pressure (Admit) 136/64       Blood Pressure (Exercise) 190/80       Blood Pressure (Exit) 154/86       Heart Rate (Admit) 80 bpm       Heart Rate (Exercise) 109 bpm       Heart Rate (Exit) 97 bpm       Oxygen Saturation (Admit) 95 %       Oxygen Saturation (Exercise) 89 %       Oxygen Saturation (Exit) 93 %       Rating of Perceived Exertion (Exercise) 13       Perceived Dyspnea (Exercise) 3       Symptoms SOB              Exercise Comments:   Exercise Goals and Review:  Exercise Goals    Row Name 05/03/20 1709             Exercise Goals   Increase Physical Activity Yes       Intervention Provide advice, education, support and counseling about physical activity/exercise needs.;Develop an individualized exercise prescription for aerobic and resistive training based on initial evaluation findings, risk stratification, comorbidities and participant's personal goals.       Expected Outcomes Short Term: Attend rehab on a regular basis to increase amount of physical activity.;Long Term: Add in home exercise to make  exercise part of routine and to increase amount of physical activity.;Long Term: Exercising regularly at least 3-5 days a week.       Increase Strength and Stamina Yes       Intervention Provide advice, education, support and counseling about physical activity/exercise needs.;Develop an individualized exercise prescription for aerobic and resistive training based on initial evaluation findings, risk stratification, comorbidities and participant's personal goals.       Expected Outcomes Short Term: Increase workloads from initial exercise prescription for resistance, speed, and METs.;Short Term: Perform resistance training exercises routinely during rehab and add in resistance training at home;Long Term: Improve cardiorespiratory fitness, muscular endurance and strength as measured by increased METs and functional capacity (6MWT)       Able to understand and use rate of perceived exertion (RPE) scale Yes       Intervention Provide education and explanation on how to use RPE scale       Expected Outcomes Short Term: Able to use RPE daily in rehab to express subjective intensity level;Long Term:  Able to use RPE to guide intensity level when exercising independently       Able to understand and use Dyspnea scale Yes       Intervention Provide education and explanation on how to use Dyspnea scale       Expected Outcomes Short Term: Able to use Dyspnea scale daily in rehab to express subjective sense of shortness of breath during exertion;Long Term: Able to use Dyspnea scale to guide intensity level when exercising independently       Knowledge and understanding of Target Heart Rate Range (THRR) Yes       Intervention  Provide education and explanation of THRR including how the numbers were predicted and where they are located for reference       Expected Outcomes Short Term: Able to state/look up THRR;Short Term: Able to use daily as guideline for intensity in rehab;Long Term: Able to use THRR to govern  intensity when exercising independently       Able to check pulse independently Yes       Intervention Provide education and demonstration on how to check pulse in carotid and radial arteries.;Review the importance of being able to check your own pulse for safety during independent exercise       Expected Outcomes Short Term: Able to explain why pulse checking is important during independent exercise;Long Term: Able to check pulse independently and accurately       Understanding of Exercise Prescription Yes       Intervention Provide education, explanation, and written materials on patient's individual exercise prescription       Expected Outcomes Short Term: Able to explain program exercise prescription;Long Term: Able to explain home exercise prescription to exercise independently              Exercise Goals Re-Evaluation :   Discharge Exercise Prescription (Final Exercise Prescription Changes):  Exercise Prescription Changes - 05/03/20 1700      Response to Exercise   Blood Pressure (Admit) 136/64    Blood Pressure (Exercise) 190/80    Blood Pressure (Exit) 154/86    Heart Rate (Admit) 80 bpm    Heart Rate (Exercise) 109 bpm    Heart Rate (Exit) 97 bpm    Oxygen Saturation (Admit) 95 %    Oxygen Saturation (Exercise) 89 %    Oxygen Saturation (Exit) 93 %    Rating of Perceived Exertion (Exercise) 13    Perceived Dyspnea (Exercise) 3    Symptoms SOB           Nutrition:  Target Goals: Understanding of nutrition guidelines, daily intake of sodium 1500mg , cholesterol 200mg , calories 30% from fat and 7% or less from saturated fats, daily to have 5 or more servings of fruits and vegetables.  Education: All About Nutrition: -Group instruction provided by verbal, written material, interactive activities, discussions, models, and posters to present general guidelines for heart healthy nutrition including fat, fiber, MyPlate, the role of sodium in heart healthy nutrition,  utilization of the nutrition label, and utilization of this knowledge for meal planning. Follow up email sent as well. Written material given at graduation.   Biometrics:  Pre Biometrics - 05/03/20 1709      Pre Biometrics   Height 5\' 7"  (1.702 m)    Weight 183 lb 14.4 oz (83.4 kg)    BMI (Calculated) 28.8            Nutrition Therapy Plan and Nutrition Goals:   Nutrition Assessments:  MEDIFICTS Score Key:  ?70 Need to make dietary changes   40-70 Heart Healthy Diet  ? 40 Therapeutic Level Cholesterol Diet   Picture Your Plate Scores:  [id number] Unhealthy dietary pattern with much room for improvement.  41-50 Dietary pattern unlikely to meet recommendations for good health and room for improvement.  51-60 More healthful dietary pattern, with some room for improvement.   >60 Healthy dietary pattern, although there may be some specific behaviors that could be improved.   Nutrition Goals Re-Evaluation:   Nutrition Goals Discharge (Final Nutrition Goals Re-Evaluation):   Psychosocial: Target Goals: Acknowledge presence or absence of significant depression and/or stress,  maximize coping skills, provide positive support system. Participant is able to verbalize types and ability to use techniques and skills needed for reducing stress and depression.   Education: Stress, Anxiety, and Depression - Group verbal and visual presentation to define topics covered.  Reviews how body is impacted by stress, anxiety, and depression.  Also discusses healthy ways to reduce stress and to treat/manage anxiety and depression.  Written material given at graduation.   Education: Sleep Hygiene -Provides group verbal and written instruction about how sleep can affect your health.  Define sleep hygiene, discuss sleep cycles and impact of sleep habits. Review good sleep hygiene tips.    Initial Review & Psychosocial Screening:  Initial Psych Review & Screening - 04/21/20 1545      Initial  Review   Current issues with Current Psychotropic Meds;Current Anxiety/Panic      Family Dynamics   Good Support System? Yes    Comments Ezekiel has general anxiety since she lost her husband and daughter. She lives alone and the pandemic has not helped with her anxiety. She can look to God and Son in-law for support. She has great neighbors and two really good friends at [facility].      Barriers   Psychosocial barriers to participate in program The patient should benefit from training in stress management and relaxation.      Screening Interventions   Interventions Encouraged to exercise;To provide support and resources with identified psychosocial needs;Provide feedback about the scores to participant    Expected Outcomes Short Term goal: Utilizing psychosocial counselor, staff and physician to assist with identification of specific Stressors or current issues interfering with healing process. Setting desired goal for each stressor or current issue identified.;Long Term Goal: Stressors or current issues are controlled or eliminated.;Short Term goal: Identification and review with participant of any Quality of Life or Depression concerns found by scoring the questionnaire.;Long Term goal: The participant improves quality of Life and PHQ9 Scores as seen by post scores and/or verbalization of changes           Quality of Life Scores:  Scores of 19 and below usually indicate a poorer quality of life in these areas.  A difference of  2-3 points is a clinically meaningful difference.  A difference of 2-3 points in the total score of the Quality of Life Index has been associated with significant improvement in overall quality of life, self-image, physical symptoms, and general health in studies assessing change in quality of life.  PHQ-9: Recent Review Flowsheet Data    Depression screen [facility] 2/9 05/03/2020   Decreased Interest 1   Down, Depressed, Hopeless 0   PHQ - 2 Score 1   Altered sleeping  0   Tired, decreased energy 3   Change in appetite 0   Feeling bad or failure about yourself  0   Trouble concentrating 3   Moving slowly or fidgety/restless 0   Suicidal thoughts 0   PHQ-9 Score 7     Interpretation of Total Score  Total Score Depression Severity:  1-4 = Minimal depression, 5-9 = Mild depression, 10-14 = Moderate depression, 15-19 = Moderately severe depression, 20-27 = Severe depression   Psychosocial Evaluation and Intervention:  Psychosocial Evaluation - 04/21/20 1548      Psychosocial Evaluation & Interventions   Interventions Encouraged to exercise with the program and follow exercise prescription;Relaxation education;Stress management education    Comments Bhavna has general anxiety since she lost her husband and daughter. She lives alone  and the pandemic has not helped with her anxiety. She can look to God and Son in-law for support. She has great neighbors and two really good friends at [facility].    Expected Outcomes Short: Exercise regularly to support mental health and notify staff of any changes. Long: maintain mental health and well being through teaching of rehab or prescribed medications independently.    Continue Psychosocial Services  Follow up required by staff           Psychosocial Re-Evaluation:   Psychosocial Discharge (Final Psychosocial Re-Evaluation):   Education: Education Goals: Education classes will be provided on a weekly basis, covering required topics. Participant will state understanding/return demonstration of topics presented.  Learning Barriers/Preferences:  Learning Barriers/Preferences - 04/21/20 1544      Learning Barriers/Preferences   Learning Barriers None    Learning Preferences None           General Pulmonary Education Topics:  Infection Prevention: - Provides verbal and written material to individual with discussion of infection control including proper hand washing and proper equipment cleaning during  exercise session. Flowsheet Row Pulmonary Rehab from 05/03/2020 [facility] Card[facility] and Pulmonary Rehab  Date 05/03/20  Educator AS  Instruction Review Code 1- Verbalizes Understanding      Falls Prevention: - Provides verbal and written material to individual with discussion of falls prevention and safety. Flowsheet Row Pulmonary Rehab [facility] 05[facility]03/2020 [facility] Pulmonary Rehab  Date 05/03/20  Educator AS  Instruction Review Code 1- Verbalizes Understanding      Chronic Lung Disease Review: - Group verbal instruction with posters, models, PowerPoint presentations and videos,  to review new updates, new respiratory medications, new advancements in procedures and treatments. Providing information on websites and "800" numbers for continued self-education. Includes information about supplement oxygen, available portable oxygen systems, continuous and intermittent flow rates, oxygen safety, concentrators, and Medicare reimbursement for oxygen. Explanation of Pulmonary Drugs, including class, frequency, complications, importance of spacers, rinsing mouth after steroid MDI's, and proper cleaning methods for nebulizers. Review of basic lung anatomy and physiology related to function, structure, and complications of lung disease. Review of risk factors. Discussion about methods for diagnosing sleep apnea and types of masks and machines for OSA. Includes a review of the use of types of environmental controls: home humidity, furnaces, filters, dust mite/pet prevention, HEPA vacuums. Discussion about weather changes, air quality and the benefits of nasal washing. Instruction on Warning signs, infection symptoms, calling MD promptly, preventive modes, and value of vaccinations. Review of effective airway clearance, coughing and/or vibration techniques. Emphasizing that all should Create an Action Plan. Written material given at graduation.   AED/CPR: - Group verbal and written instruction with the use  of models to demonstrate the basic use of the AED with the basic ABC's of resuscitation.    Anatomy and Cardiac Procedures: - Group verbal and visual presentation and models provide information about basic cardiac anatomy and function. Reviews the testing methods done to diagnose heart disease and the outcomes of the test results. Describes the treatment choices: Medical Management, Angioplasty, or Coronary Bypass Surgery for treating various heart conditions including Myocardial Infarction, Angina, Valve Disease, and Cardiac Arrhythmias.  Written material given at graduation.   Medication Safety: - Group verbal and visual instruction to review commonly prescribed medications for heart and lung disease. Reviews the medication, class of the drug, and side effects. Includes the steps to properly store meds and maintain the prescription regimen.  Written material given at graduation.  Other: -Provides group and verbal instruction on various topics (see comments)   Knowledge Questionnaire Score:  Knowledge Questionnaire Score - 05/03/20 1710      Knowledge Questionnaire Score   Pre Score 16/18            Core Components/Risk Factors/Patient Goals at Admission:  Personal Goals and Risk Factors at Admission - 05/03/20 1712      Core Components/Risk Factors/Patient Goals on Admission    Weight Management Yes;Weight Loss    Intervention Weight Management: Develop a combined nutrition and exercise program designed to reach desired caloric intake, while maintaining appropriate intake of nutrient and fiber, sodium and fats, and appropriate energy expenditure required for the weight goal.;Weight Management: Provide education and appropriate resources to help participant work on and attain dietary goals.;Weight Management/Obesity: Establish reasonable short term and long term weight goals.    Expected Outcomes Short Term: Continue to assess and modify interventions until short term weight is  achieved;Long Term: Adherence to nutrition and physical activity/exercise program aimed toward attainment of established weight goal;Weight Loss: Understanding of general recommendations for a balanced deficit meal plan, which promotes 1-2 lb weight loss per week and includes a negative energy balance of [phone] kcal/d;Understanding recommendations for meals to include 15-35% energy as protein, 25-35% energy from fat, 35-60% energy from carbohydrates, less than 200mg  of dietary cholesterol, 20-35 gm of total fiber daily;Understanding of distribution of calorie intake throughout the day with the consumption of 4-5 meals/snacks    Improve shortness of breath with ADL's Yes    Intervention Provide education, individualized exercise plan and daily activity instruction to help decrease symptoms of SOB with activities of daily living.    Expected Outcomes Short Term: Improve cardiorespiratory fitness to achieve a reduction of symptoms when performing ADLs;Long Term: Be able to perform more ADLs without symptoms or delay the onset of symptoms    Hypertension Yes    Intervention Provide education on lifestyle modifcations including regular physical activity/exercise, weight management, moderate sodium restriction and increased consumption of fresh fruit, vegetables, and low fat dairy, alcohol moderation, and smoking cessation.;Monitor prescription use compliance.    Expected Outcomes Short Term: Continued assessment and intervention until BP is < 140/11mm HG in hypertensive participants. < 130/9mm HG in hypertensive participants with diabetes, heart failure or chronic kidney disease.;Long Term: Maintenance of blood pressure at goal levels.    Lipids Yes    Intervention Provide education and support for participant on nutrition & aerobic/resistive exercise along with prescribed medications to achieve LDL 70mg , HDL >40mg .    Expected Outcomes Short Term: Participant states understanding of desired cholesterol  values and is compliant with medications prescribed. Participant is following exercise prescription and nutrition guidelines.;Long Term: Cholesterol controlled with medications as prescribed, with individualized exercise RX and with personalized nutrition plan. Value goals: LDL < 70mg , HDL > 40 mg.           Education:Diabetes - Individual verbal and written instruction to review signs/symptoms of diabetes, desired ranges of glucose level fasting, after meals and with exercise. Acknowledge that pre and post exercise glucose checks will be done for 3 sessions at entry of program.   Know Your Numbers and Heart Failure: - Group verbal and visual instruction to discuss disease risk factors for cardiac and pulmonary disease and treatment options.  Reviews associated critical values for Overweight/Obesity, Hypertension, Cholesterol, and Diabetes.  Discusses basics of heart failure: signs/symptoms and treatments.  Introduces Heart Failure Zone chart for action plan for heart failure.  Written  material given at graduation.   Core Components/Risk Factors/Patient Goals Review:    Core Components/Risk Factors/Patient Goals at Discharge (Final Review):    ITP Comments:  ITP Comments    Row Name 04/21/20 1551           ITP Comments Virtual Visit completed. Patient informed on EP and RD appointment and 6 Minute walk test. Patient also informed of patient health questionnaires on My Chart. Patient Verbalizes understanding. Visit diagnosis can be found in [facility] 04/05/2020.              Comments: initial ITP

## 2020-05-12 ENCOUNTER — Other Ambulatory Visit: Payer: Self-pay

## 2020-05-12 ENCOUNTER — Encounter: Payer: Medicare Other | Attending: Pulmonary Disease

## 2020-05-12 DIAGNOSIS — J449 Chronic obstructive pulmonary disease, unspecified: Secondary | ICD-10-CM | POA: Diagnosis present

## 2020-05-12 NOTE — Progress Notes (Signed)
Daily Session Note  Patient Details  Name: [name] [name] MRN: [id number] Date of Birth: 1925/07/22 Referring Provider:   Flowsheet Row Pulmonary Rehab from 05/03/2020 in [facility] Cardiac and Pulmonary Rehab  Referring Provider [name]      Encounter Date: 05/12/2020  Check In:  Session Check In - 05/12/20 1539      Check-In   Supervising physician immediately available to respond to emergencies See telemetry face sheet for immediately available ER MD    Location [facility]-Cardiac & Pulmonary Rehab    Staff Present Birdie Sons, MPA, RN;Joseph Lou [place], [place] Exercise Physiologist    Virtual Visit No    Medication changes reported     No    Fall or balance concerns reported    No    Warm-up and Cool-down Performed on first and last piece of equipment    Resistance Training Performed Yes    VAD Patient? No    PAD/SET Patient? No      Pain Assessment   Currently in Pain? No/denies              Social History   Tobacco Use  Smoking Status Former Smoker  . Packs/day: 2.00  . Years: 20.00  . Pack years: 40.00  . Types: Cigarettes  . Quit date: 04/22/1979  . Years since quitting: 41.0  Smokeless Tobacco Never Used    Goals Met:  Independence with exercise equipment Exercise tolerated well No report of cardiac concerns or symptoms Strength training completed today  Goals Unmet:  Not Applicable  Comments: First full day of exercise!  Patient was oriented to gym and equipment including functions, settings, policies, and procedures.  Patient's individual exercise prescription and treatment plan were reviewed.  All starting workloads were established based on the results of the 6 minute walk test done at initial orientation visit.  The plan for exercise progression was also introduced and progression will be customized based on patient's performance and goals.    Dr. Emily Filbert is Medical Director for [place] and  [facility].

## 2020-05-13 ENCOUNTER — Other Ambulatory Visit: Payer: Self-pay

## 2020-05-13 ENCOUNTER — Encounter: Payer: Medicare Other | Admitting: *Deleted

## 2020-05-13 DIAGNOSIS — J449 Chronic obstructive pulmonary disease, unspecified: Secondary | ICD-10-CM

## 2020-05-13 NOTE — Progress Notes (Signed)
Daily Session Note  Patient Details  Name: [name] [name] MRN: [id number] Date of Birth: 04-25-1925 Referring Provider:   Flowsheet Row Pulmonary Rehab from 05/03/2020 in [facility] Cardiac and Pulmonary Rehab  Referring Provider [name]      Encounter Date: 05/13/2020  Check In:  Session Check In - 05/13/20 1531      Check-In   Supervising physician immediately available to respond to emergencies See telemetry face sheet for immediately available ER MD    Location [facility]-Cardiac & Pulmonary Rehab    Staff Present Renita Papa, RN BSN;Joseph Foy [place], [place], ACSM CEP, Exercise Physiologist    Virtual Visit No    Medication changes reported     No    Fall or balance concerns reported    No    Warm-up and Cool-down Performed on first and last piece of equipment    Resistance Training Performed Yes    VAD Patient? No    PAD/SET Patient? No      Pain Assessment   Currently in Pain? No/denies              Social History   Tobacco Use  Smoking Status Former Smoker  . Packs/day: 2.00  . Years: 20.00  . Pack years: 40.00  . Types: Cigarettes  . Quit date: 04/22/1979  . Years since quitting: 41.0  Smokeless Tobacco Never Used    Goals Met:  Independence with exercise equipment Exercise tolerated well No report of cardiac concerns or symptoms Strength training completed today  Goals Unmet:  Not Applicable  Comments: Pt able to follow exercise prescription today without complaint.  Will continue to monitor for progression.    Dr. Emily Filbert is Medical Director for [place] and [facility].

## 2020-05-17 ENCOUNTER — Other Ambulatory Visit: Payer: Self-pay

## 2020-05-17 DIAGNOSIS — J449 Chronic obstructive pulmonary disease, unspecified: Secondary | ICD-10-CM

## 2020-05-17 NOTE — Progress Notes (Signed)
Completed initial RD Evaluation.  

## 2020-05-17 NOTE — Progress Notes (Signed)
Daily Session Note  Patient Details  Name: [name] [name] MRN: [id number] Date of Birth: 1925/06/04 Referring Provider:   Flowsheet Row Pulmonary Rehab from 05/03/2020 in [facility] Cardiac [facility]monary Rehab  Referring Provider [name]      Encounter Date: 05/17/2020  Check In:  Session Check In - 05/17/20 1538      Check-In   Supervising physician immediately available to respond to emergencies See telemetry face sheet for immediately available ER MD    Location [facility]-Cardiac & Pulmonary Rehab    Staff Present Birdie Sons, MPA, Mauricia Area, BS, ACSM CEP, Exercise Physiologist;Kara Eliezer Bottom, MS Exercise Physiologist    Virtual Visit No    Medication changes reported     No    Fall or balance concerns reported    No    Warm-up and Cool-down Performed on first and last piece of equipment    Resistance Training Performed Yes    VAD Patient? No    PAD/SET Patient? No      Pain Assessment   Currently in Pain? No/denies              Social History   Tobacco Use  Smoking Status Former Smoker  . Packs/day: 2.00  . Years: 20.00  . Pack years: 40.00  . Types: Cigarettes  . Quit date: 04/22/1979  . Years since quitting: 41.0  Smokeless Tobacco Never Used    Goals Met:  Independence with exercise equipment Exercise tolerated well No report of cardiac concerns or symptoms Strength training completed today  Goals Unmet:  Not Applicable  Comments: Pt able to follow exercise prescription today without complaint.  Will continue to monitor for progression.    Dr. Emily Filbert is Medical Director for [place] and [facility].

## 2020-05-19 ENCOUNTER — Other Ambulatory Visit: Payer: Self-pay

## 2020-05-19 DIAGNOSIS — J449 Chronic obstructive pulmonary disease, unspecified: Secondary | ICD-10-CM

## 2020-05-19 NOTE — Progress Notes (Signed)
Daily Session Note  Patient Details  Name: Rosellen Lichtenberger MRN: [id number] Date of Birth: April 28, 1925 Referring Provider:   Flowsheet Row Pulmonary Rehab from 05/03/2020 in [facility] Cardiac and Pulmonary Rehab  Referring Provider [name]      Encounter Date: 05/19/2020  Check In:  Session Check In - 05/19/20 1531      Check-In   Supervising physician immediately available to respond to emergencies See telemetry face sheet for immediately available ER MD    Location [facility]-Cardiac & Pulmonary Rehab    Staff Present Birdie Sons, MPA, RN;Meredith Sherryll Burger, RN Margurite Auerbach, MS Exercise Physiologist    Virtual Visit No    Medication changes reported     No    Fall or balance concerns reported    Yes    Comments says she fell two times this week. no injury    Warm-up and Cool-down Performed on first and last piece of equipment    Resistance Training Performed Yes    VAD Patient? No    PAD/SET Patient? No      Pain Assessment   Currently in Pain? No/denies              Social History   Tobacco Use  Smoking Status Former Smoker  . Packs/day: 2.00  . Years: 20.00  . Pack years: 40.00  . Types: Cigarettes  . Quit date: 04/22/1979  . Years since quitting: 41.1  Smokeless Tobacco Never Used    Goals Met:  Independence with exercise equipment Exercise tolerated well No report of cardiac concerns or symptoms Strength training completed today  Goals Unmet:  Not Applicable  Comments: Pt able to follow exercise prescription today without complaint.  Will continue to monitor for progression.    Dr. Emily Filbert is Medical Director for [place] and [facility].

## 2020-05-26 ENCOUNTER — Encounter: Payer: Self-pay | Admitting: *Deleted

## 2020-05-26 DIAGNOSIS — J449 Chronic obstructive pulmonary disease, unspecified: Secondary | ICD-10-CM

## 2020-05-26 NOTE — Progress Notes (Signed)
Pulmonary Individual Treatment Plan  Patient Details  Name: [name] [name] MRN: [id number] Date of Birth: 28-Jan-1925 Referring Provider:   Flowsheet Row Pulmonary Rehab from 05/03/2020 in [facility] Card[facility] and Pulmonary Rehab  Referring Provider Aleskerov      Initial Encounter Date:  Flowsheet Row Pulmonary Rehab from 05/03/2020 in [facility] Cardiac and Pulmonary Rehab  Date 05/03/20      Visit Diagnosis: Chronic obstructive pulmonary disease, unspecified COPD type ([place])  Patient's Home Medications on Admission:  Current Outpatient Medications:  .  acetaminophen (TYLENOL) 500 MG tablet, Take 500-1,000 mg by mouth every 6 (six) hours as needed for mild pain, moderate pain or fever., Disp: , Rfl:  .  acidophilus (RISAQUAD) CAPS capsule, Take 1 capsule by mouth daily. (Patient not taking: Reported on 04/21/2020), Disp: 14 capsule, Rfl: 0 .  albuterol (PROVENTIL HFA;VENTOLIN HFA) 108 (90 Base) MCG/ACT inhaler, Inhale 2 puffs into the lungs every 4 (four) hours as needed for wheezing or shortness of breath., Disp: , Rfl:  .  budesonide (PULMICORT) 0.25 MG/2ML nebulizer solution, Inhale into the lungs., Disp: , Rfl:  .  chlorthalidone (HYGROTON) 25 MG tablet, Take 0.5 tablets (12.5 mg total) by mouth daily. May resume on 11/1 if diarrhea resolves, Disp: , Rfl:  .  Coenzyme Q10 10 MG capsule, Take 10 mg by mouth daily. , Disp: , Rfl:  .  Cyanocobalamin (B-12) 1000 MCG CAPS, Take 2,000 Units by mouth daily. , Disp: , Rfl:  .  cycloSPORINE (RESTASIS) 0.05 % ophthalmic emulsion, Place 1 drop into both eyes 2 (two) times daily., Disp: , Rfl:  .  escitalopram (LEXAPRO) 20 MG tablet, Take 20 mg by mouth daily., Disp: , Rfl:  .  fluticasone (FLONASE) 50 MCG/ACT nasal spray, Place into the nose., Disp: , Rfl:  .  fluticasone (FLOVENT HFA) 110 MCG/ACT inhaler, Inhale 2 puffs into the lungs 2 (two) times daily. (Patient not taking: Reported on 04/21/2020), Disp: , Rfl:  .  gabapentin (NEURONTIN) 300 MG  capsule, Take 300 mg by mouth 2 (two) times daily., Disp: , Rfl:  .  guaiFENesin (MUCINEX) 600 MG 12 hr tablet, Take 600 mg by mouth 2 (two) times daily as needed for cough or to loosen phlegm.  (Patient not taking: Reported on 04/21/2020), Disp: , Rfl:  .  hydrochlorothiazide (HYDRODIURIL) 12.5 MG tablet, Take 12.5 mg by mouth daily., Disp: , Rfl:  .  hydrocortisone 2.5 % cream, Apply topically 2 (two) times daily as needed (Rash). (Patient not taking: Reported on 04/21/2020), Disp: 28 g, Rfl: 3 .  Ipratropium-Albuterol (COMBIVENT) 20-100 MCG/ACT AERS respimat, Inhale 2 puffs into the lungs in the morning, at noon, in the evening, and at bedtime. (Patient not taking: Reported on 04/21/2020), Disp: 1 each, Rfl: 1 .  ipratropium-albuterol (DUONEB) 0.5-2.5 (3) MG/3ML SOLN, Inhale into the lungs., Disp: , Rfl:  .  Loratadine 10 MG CAPS, Take 1 tablet by mouth daily., Disp: , Rfl:  .  losartan (COZAAR) 50 MG tablet, Take 50 mg by mouth daily., Disp: , Rfl:  .  mometasone (ELOCON) 0.1 % lotion, Apply 1 application topically daily as needed (skin irritation).  (Patient not taking: Reported on 04/21/2020), Disp: , Rfl:  .  Multiple Vitamins-Minerals (OCUVITE PRESERVISION PO), Take 1 tablet by mouth 2 (two) times daily., Disp: , Rfl:  .  potassium chloride (KLOR-CON) 10 MEQ tablet, Take 10 mEq by mouth 3 (three) times daily., Disp: , Rfl:  .  predniSONE (DELTASONE) 20 MG tablet, Take 3 tablets once  daily for 3 days followed by 2 tablets once daily for 3 days followed by 1 tablet once daily for 3 days and then stop (Patient not taking: Reported on 04/21/2020), Disp: 18 tablet, Rfl: 0 .  RABEprazole (ACIPHEX) 20 MG tablet, Take 20 mg by mouth 2 (two) times daily., Disp: , Rfl:  .  saccharomyces boulardii (FLORASTOR) 250 MG capsule, Take by mouth. (Patient not taking: Reported on 04/21/2020), Disp: , Rfl:  .  timolol (TIMOPTIC-XR) 0.5 % ophthalmic gel-forming, Place 1 drop into both eyes daily., Disp: , Rfl:  .   tiZANidine (ZANAFLEX) 4 MG tablet, Take 2-4 mg by mouth 2 (two) times daily as needed for muscle spasms., Disp: , Rfl:  .  valACYclovir (VALTREX) 1000 MG tablet, Take 1,000 mg by mouth daily as needed (breakouts).  (Patient not taking: Reported on 04/21/2020), Disp: , Rfl:  No current facility-administered medications for this visit.  Facility-Administered Medications Ordered in Other Visits:  .  0.9 %  sodium chloride infusion, , Intravenous, Continuous, Sindy Guadeloupe, MD, Last Rate: 0 mL/hr at 10/08/17 1518, New Bag at 06/21/18 [id number]  Past Medical History: Past Medical History:  Diagnosis Date  . Actinic keratosis   . Anemia   . COPD (chronic obstructive pulmonary disease) ([place])   . Diverticulitis   . GERD (gastroesophageal reflux disease)   . Glaucoma   . HLD (hyperlipidemia)   . Hypertension   . Iron deficiency   . Rectal bleeding   . Skin cancer    Nose, txted by Dr. Valere Dross [facility]    Tobacco Use: Social History   Tobacco Use  Smoking Status Former Smoker  . Packs/day: 2.00  . Years: 20.00  . Pack years: 40.00  . Types: Cigarettes  . Quit date: 04/22/1979  . Years since quitting: 41.1  Smokeless Tobacco Never Used    Labs: Recent Review Flowsheet Data   There is no flowsheet data to display.      Pulmonary Assessment Scores:  Pulmonary Assessment Scores    Row Name 05/03/20 1711         ADL UCSD   SOB Score total 70     Rest 0     Walk 2     Stairs 5     Bath 0     Dress 3     Shop 5           CAT Score   CAT Score 20            UCSD: Self-administered rating of dyspnea associated with activities of daily living (ADLs) 6-point scale (0 = "not at all" to 5 = "maximal or unable to do because of breathlessness")  Scoring Scores range from 0 to 120.  Minimally important difference is 5 units  CAT: CAT can identify the health impairment of COPD patients and is better correlated with disease progression.  CAT has a scoring range of zero to 40.  The CAT score is classified into four groups of low (less than 10), medium (10 - 20), high (21-30) and very high (31-40) based on the impact level of disease on health status. A CAT score over 10 suggests significant symptoms.  A worsening CAT score could be explained by an exacerbation, poor medication adherence, poor inhaler technique, or progression of COPD or comorbid conditions.  CAT MCID is 2 points  mMRC: mMRC (Modified Medical Research Council) Dyspnea Scale is used to assess the degree of baseline functional disability in patients of respiratory  disease due to dyspnea. No minimal important difference is established. A decrease in score of 1 point or greater is considered a positive change.   Pulmonary Function Assessment:  Pulmonary Function Assessment - 04/21/20 1544      Breath   Shortness of Breath Yes;Limiting activity           Exercise Target Goals: Exercise Program Goal: Individual exercise prescription set using results from initial 6 min walk test and THRR while considering  patient's activity barriers and safety.   Exercise Prescription Goal: Initial exercise prescription builds to 30-45 minutes a day of aerobic activity, 2-3 days per week.  Home exercise guidelines will be given to patient during program as part of exercise prescription that the participant will acknowledge.  Education: Aerobic Exercise: - Group verbal and visual presentation on the components of exercise prescription. Introduces F.I.T.T principle from ACSM for exercise prescriptions.  Reviews F.I.T.T. principles of aerobic exercise including progression. Written material given at graduation.   Education: Resistance Exercise: - Group verbal and visual presentation on the components of exercise prescription. Introduces F.I.T.T principle from ACSM for exercise prescriptions  Reviews F.I.T.T. principles of resistance exercise including progression. Written material given at graduation.    Education:  Exercise & Equipment Safety: - Individual verbal instruction and demonstration of equipment use and safety with use of the equipment. Flowsheet Row Pulmonary Rehab from 05/12/2020 in [facility] Card[facility] and Pulmonary Rehab  Date 05/03/20  Educator AS  Instruction Review Code 1- Verbalizes Understanding      Education: Exercise Physiology & General Exercise Guidelines: - Group verbal and written instruction with models to review the exercise physiology of the cardiovascular system and associated critical values. Provides general exercise guidelines with specific guidelines to those with heart or lung disease.    Education: Flexibility, Balance, Mind/Body Relaxation: - Group verbal and visual presentation with interactive activity on the components of exercise prescription. Introduces F.I.T.T principle from ACSM for exercise prescriptions. Reviews F.I.T.T. principles of flexibility and balance exercise training including progression. Also discusses the mind body connection.  Reviews various relaxation techniques to help reduce and manage stress (i.e. Deep breathing, progressive muscle relaxation, and visualization). Balance handout provided to take home. Written material given at graduation. Flowsheet Row Pulmonary Rehab from 05/12/2020 in [facility] Cardiac and Pulmonary Rehab  Date 05/12/20  Educator AS  Instruction Review Code 1- Verbalizes Understanding      Activity Barriers & Risk Stratification:   6 Minute Walk:  6 Minute Walk    Row Name 05/03/20 1703         6 Minute Walk   Phase Initial     Distance 300 feet     Walk Time 3 minutes     # of Rest Breaks 3     MPH 1.13     METS 1     RPE 13     Perceived Dyspnea  3     VO2 Peak 1.37     Symptoms Yes (comment)     Comments SOB     Resting HR 80 bpm     Resting BP 136/64     Resting Oxygen Saturation  95 %     Exercise Oxygen Saturation  during 6 min walk 89 %     Max Ex. HR 109 bpm     Max Ex. BP 190/80     2 Minute Post BP  154/86           Interval HR   1 Minute HR  106     2 Minute HR 97     3 Minute HR 105     4 Minute HR 108     5 Minute HR 104     6 Minute HR 109     2 Minute Post HR 97     Interval Heart Rate? Yes           Interval Oxygen   Interval Oxygen? Yes     Baseline Oxygen Saturation % 95 %     1 Minute Oxygen Saturation % 91 %     1 Minute Liters of Oxygen 0 L     2 Minute Oxygen Saturation % 90 %     2 Minute Liters of Oxygen 0 L     3 Minute Oxygen Saturation % 94 %     3 Minute Liters of Oxygen 0 L     4 Minute Oxygen Saturation % 91 %     4 Minute Liters of Oxygen 0 L     5 Minute Oxygen Saturation % 93 %     5 Minute Liters of Oxygen 0 L     6 Minute Oxygen Saturation % 89 %     6 Minute Liters of Oxygen 0 L     2 Minute Post Oxygen Saturation % 93 %     2 Minute Post Liters of Oxygen 0 L           Oxygen Initial Assessment:  Oxygen Initial Assessment - 04/21/20 1543      Home Oxygen   Home Oxygen Device None    Sleep Oxygen Prescription None    Home Exercise Oxygen Prescription None    Home Resting Oxygen Prescription None      Initial 6 min Walk   Oxygen Used None      Program Oxygen Prescription   Program Oxygen Prescription None      Intervention   Short Term Goals To learn and understand importance of monitoring SPO2 with pulse oximeter and demonstrate accurate use of the pulse oximeter.;To learn and understand importance of maintaining oxygen saturations>88%;To learn and demonstrate proper pursed lip breathing techniques or other breathing techniques.;To learn and demonstrate proper use of respiratory medications    Long  Term Goals Verbalizes importance of monitoring SPO2 with pulse oximeter and return demonstration;Maintenance of O2 saturations>88%;Exhibits proper breathing techniques, such as pursed lip breathing or other method taught during program session;Compliance with respiratory medication;Demonstrates proper use of MDI's           Oxygen  Re-Evaluation:  Oxygen Re-Evaluation    Row Name 05/12/20 1540             Program Oxygen Prescription   Program Oxygen Prescription None               Home Oxygen   Home Oxygen Device None       Sleep Oxygen Prescription None       Home Exercise Oxygen Prescription None       Home Resting Oxygen Prescription None               Goals/Expected Outcomes   Short Term Goals To learn and understand importance of monitoring SPO2 with pulse oximeter and demonstrate accurate use of the pulse oximeter.;To learn and understand importance of maintaining oxygen saturations>88%;To learn and demonstrate proper pursed lip breathing techniques or other breathing techniques.       Long  Term Goals Verbalizes importance of monitoring SPO2 with  pulse oximeter and return demonstration;Maintenance of O2 saturations>88%;Exhibits proper breathing techniques, such as pursed lip breathing or other method taught during program session       Comments Reviewed PLB technique with pt.  Talked about how it works and it's importance in maintaining their exercise saturations.       Goals/Expected Outcomes Short: Become more profiecient at using PLB.   Long: Become independent at using PLB.              Oxygen Discharge (Final Oxygen Re-Evaluation):  Oxygen Re-Evaluation - 05/12/20 1540      Program Oxygen Prescription   Program Oxygen Prescription None      Home Oxygen   Home Oxygen Device None    Sleep Oxygen Prescription None    Home Exercise Oxygen Prescription None    Home Resting Oxygen Prescription None      Goals/Expected Outcomes   Short Term Goals To learn and understand importance of monitoring SPO2 with pulse oximeter and demonstrate accurate use of the pulse oximeter.;To learn and understand importance of maintaining oxygen saturations>88%;To learn and demonstrate proper pursed lip breathing techniques or other breathing techniques.    Long  Term Goals Verbalizes importance of monitoring SPO2  with pulse oximeter and return demonstration;Maintenance of O2 saturations>88%;Exhibits proper breathing techniques, such as pursed lip breathing or other method taught during program session    Comments Reviewed PLB technique with pt.  Talked about how it works and it's importance in maintaining their exercise saturations.    Goals/Expected Outcomes Short: Become more profiecient at using PLB.   Long: Become independent at using PLB.           Initial Exercise Prescription:  Initial Exercise Prescription - 05/03/20 1700      Date of Initial Exercise RX and Referring Provider   Date 05/03/20    Referring Provider Aleskerov      Recumbant Bike   Level 1    RPM 60    Minutes 15    METs 1      NuStep   Level 1    SPM 80    Minutes 15    METs 1      Arm Ergometer   Level 1    RPM 25    Minutes 15    METs 1      Track   Laps 7    Minutes 15    METs 1      Prescription Details   Frequency (times per week) 3    Duration Progress to 30 minutes of continuous aerobic without signs/symptoms of physical distress      Intensity   THRR 40-80% of Max Heartrate 99-117    Ratings of Perceived Exertion 11-13      Resistance Training   Training Prescription Yes    Weight 2 lb    Reps 10-15           Perform Capillary Blood Glucose checks as needed.  Exercise Prescription Changes:  Exercise Prescription Changes    Row Name 05/03/20 1700 05/25/20 1400           Response to Exercise   Blood Pressure (Admit) 136/64 146/68      Blood Pressure (Exercise) 190/80 148/68      Blood Pressure (Exit) 154/86 152/66      Heart Rate (Admit) 80 bpm 82 bpm      Heart Rate (Exercise) 109 bpm 90 bpm      Heart Rate (Exit) 97 bpm  86 bpm      Oxygen Saturation (Admit) 95 % 92 %      Oxygen Saturation (Exercise) 89 % 90 %      Oxygen Saturation (Exit) 93 % 93 %      Rating of Perceived Exertion (Exercise) 13 13      Perceived Dyspnea (Exercise) 3 2      Symptoms SOB none       Duration -- Progress to 30 minutes of  aerobic without signs/symptoms of physical distress      Intensity -- THRR unchanged             Progression   Progression -- Continue to progress workloads to maintain intensity without signs/symptoms of physical distress.      Average METs -- 1.65             Resistance Training   Training Prescription -- Yes      Weight -- 2 lb      Reps -- 10-15             NuStep   Level -- 1      SPM -- 80      Minutes -- 15      METs -- 1.65             Exercise Comments:  Exercise Comments    Row Name 05/12/20 1539           Exercise Comments First full day of exercise!  Patient was oriented to gym and equipment including functions, settings, policies, and procedures.  Patient's individual exercise prescription and treatment plan were reviewed.  All starting workloads were established based on the results of the 6 minute walk test done at initial orientation visit.  The plan for exercise progression was also introduced and progression will be customized based on patient's performance and goals.              Exercise Goals and Review:  Exercise Goals    Row Name 05/03/20 1709             Exercise Goals   Increase Physical Activity Yes       Intervention Provide advice, education, support and counseling about physical activity/exercise needs.;Develop an individualized exercise prescription for aerobic and resistive training based on initial evaluation findings, risk stratification, comorbidities and participant's personal goals.       Expected Outcomes Short Term: Attend rehab on a regular basis to increase amount of physical activity.;Long Term: Add in home exercise to make exercise part of routine and to increase amount of physical activity.;Long Term: Exercising regularly at least 3-5 days a week.       Increase Strength and Stamina Yes       Intervention Provide advice, education, support and counseling about physical  activity/exercise needs.;Develop an individualized exercise prescription for aerobic and resistive training based on initial evaluation findings, risk stratification, comorbidities and participant's personal goals.       Expected Outcomes Short Term: Increase workloads from initial exercise prescription for resistance, speed, and METs.;Short Term: Perform resistance training exercises routinely during rehab and add in resistance training at home;Long Term: Improve cardiorespiratory fitness, muscular endurance and strength as measured by increased METs and functional capacity (6MWT)       Able to understand and use rate of perceived exertion (RPE) scale Yes       Intervention Provide education and explanation on how to use RPE scale       Expected Outcomes  Short Term: Able to use RPE daily in rehab to express subjective intensity level;Long Term:  Able to use RPE to guide intensity level when exercising independently       Able to understand and use Dyspnea scale Yes       Intervention Provide education and explanation on how to use Dyspnea scale       Expected Outcomes Short Term: Able to use Dyspnea scale daily in rehab to express subjective sense of shortness of breath during exertion;Long Term: Able to use Dyspnea scale to guide intensity level when exercising independently       Knowledge and understanding of Target Heart Rate Range (THRR) Yes       Intervention Provide education and explanation of THRR including how the numbers were predicted and where they are located for reference       Expected Outcomes Short Term: Able to state/look up THRR;Short Term: Able to use daily as guideline for intensity in rehab;Long Term: Able to use THRR to govern intensity when exercising independently       Able to check pulse independently Yes       Intervention Provide education and demonstration on how to check pulse in carotid and radial arteries.;Review the importance of being able to check your own pulse for  safety during independent exercise       Expected Outcomes Short Term: Able to explain why pulse checking is important during independent exercise;Long Term: Able to check pulse independently and accurately       Understanding of Exercise Prescription Yes       Intervention Provide education, explanation, and written materials on patient's individual exercise prescription       Expected Outcomes Short Term: Able to explain program exercise prescription;Long Term: Able to explain home exercise prescription to exercise independently              Exercise Goals Re-Evaluation :  Exercise Goals Re-Evaluation    Row Name 05/12/20 1540 05/25/20 1405           Exercise Goal Re-Evaluation   Exercise Goals Review Increase Physical Activity;Able to understand and use rate of perceived exertion (RPE) scale;Knowledge and understanding of Target Heart Rate Range (THRR);Understanding of Exercise Prescription;Increase Strength and Stamina;Able to understand and use Dyspnea scale;Able to check pulse independently Increase Physical Activity;Increase Strength and Stamina      Comments Reviewed RPE and dyspnea scales, THR and program prescription with pt today.  Pt voiced understanding and was given a copy of goals to take home. [name] [name] does well with the NS.  She has tried the track and got up to 3 laps.  Staff will monitor progress.      Expected Outcomes Short: Use RPE daily to regulate intensity. Long: Follow program prescription in THR. Short: attend consistently Long:  build overall stamina             Discharge Exercise Prescription (Final Exercise Prescription Changes):  Exercise Prescription Changes - 05/25/20 1400      Response to Exercise   Blood Pressure (Admit) 146/68    Blood Pressure (Exercise) 148/68    Blood Pressure (Exit) 152/66    Heart Rate (Admit) 82 bpm    Heart Rate (Exercise) 90 bpm    Heart Rate (Exit) 86 bpm    Oxygen Saturation (Admit) 92 %    Oxygen Saturation (Exercise)  90 %    Oxygen Saturation (Exit) 93 %    Rating of Perceived Exertion (Exercise) 13  Perceived Dyspnea (Exercise) 2    Symptoms none    Duration Progress to 30 minutes of  aerobic without signs/symptoms of physical distress    Intensity THRR unchanged      Progression   Progression Continue to progress workloads to maintain intensity without signs/symptoms of physical distress.    Average METs 1.65      Resistance Training   Training Prescription Yes    Weight 2 lb    Reps 10-15      NuStep   Level 1    SPM 80    Minutes 15    METs 1.65           Nutrition:  Target Goals: Understanding of nutrition guidelines, daily intake of sodium 1500mg , cholesterol 200mg , calories 30% from fat and 7% or less from saturated fats, daily to have 5 or more servings of fruits and vegetables.  Education: All About Nutrition: -Group instruction provided by verbal, written material, interactive activities, discussions, models, and posters to present general guidelines for heart healthy nutrition including fat, fiber, MyPlate, the role of sodium in heart healthy nutrition, utilization of the nutrition label, and utilization of this knowledge for meal planning. Follow up email sent as well. Written material given at graduation.   Biometrics:  Pre Biometrics - 05/03/20 1709      Pre Biometrics   Height 5\' 7"  (1.702 m)    Weight 183 lb 14.4 oz (83.4 kg)    BMI (Calculated) 28.8            Nutrition Therapy Plan and Nutrition Goals:  Nutrition Therapy & Goals - 05/17/20 1339      Nutrition Therapy   Diet Heart healthy, low Na    Protein (specify units) 65g    Fiber 25 grams    Whole Grain Foods 3 servings    Saturated Fats 12 max. grams    Fruits and Vegetables 8 servings/day    Sodium 1.5 grams      Personal Nutrition Goals   Nutrition Goal ST: check blood work for iron LT: meet nutritional needs    Comments B: cereal (soy milk) and banana, cinnamon rolls, or eggs with toast.  always OJ L: fruit and cottage cheese or yogurt - sometimes tomato sandwich D: vegetables and meat (doctor told her to eat lots of beef). Example: chicken, cooked cabbage, potatoes, butter beans - she ate more than usual. S: candy and ice cream. She reports having a good variety of fruits and vegetables. She uses butter and olive oil. She does not eat salt with her cooking. She uses white bread and rye bread. Drinks: water, sometimes milk with her dinner. She takes a vitamin C, B12, and vitamin D. Her iron was low, new bloodwork will show if still low. Discussed other iron options such as pumpkin seeds, chickpeas, and lentils. Discussed heart healthy eating.      Intervention Plan   Intervention Prescribe, educate and counsel regarding individualized specific dietary modifications aiming towards targeted core components such as weight, hypertension, lipid management, diabetes, heart failure and other comorbidities.;Nutrition handout(s) given to patient.    Expected Outcomes Short Term Goal: Understand basic principles of dietary content, such as calories, fat, sodium, cholesterol and nutrients.;Short Term Goal: A plan has been developed with personal nutrition goals set during dietitian appointment.;Long Term Goal: Adherence to prescribed nutrition plan.           Nutrition Assessments:  MEDIFICTS Score Key:  ?70 Need to make dietary changes  40-70 Heart Healthy Diet  ? 40 Therapeutic Level Cholesterol Diet  Flowsheet Row Pulmonary Rehab from 05/03/2020 in [facility]  Picture Your Plate Total Score on Admission 65     Picture Your Plate Scores:  <63 Unhealthy dietary pattern with much room for improvement.  41-50 Dietary pattern unlikely to meet recommendations for good health and room for improvement.  51-60 More healthful dietary pattern, with some room for improvement.   >60 Healthy dietary pattern, although there may be some specific behaviors that could be  improved.   Nutrition Goals Re-Evaluation:   Nutrition Goals Discharge (Final Nutrition Goals Re-Evaluation):   Psychosocial: Target Goals: Acknowledge presence or absence of significant depression and/or stress, maximize coping skills, provide positive support system. Participant is able to verbalize types and ability to use techniques and skills needed for reducing stress and depression.   Education: Stress, Anxiety, and Depression - Group verbal and visual presentation to define topics covered.  Reviews how body is impacted by stress, anxiety, and depression.  Also discusses healthy ways to reduce stress and to treat/manage anxiety and depression.  Written material given at graduation.   Education: Sleep Hygiene -Provides group verbal and written instruction about how sleep can affect your health.  Define sleep hygiene, discuss sleep cycles and impact of sleep habits. Review good sleep hygiene tips.    Initial Review & Psychosocial Screening:  Initial Psych Review & Screening - 04/21/20 1545      Initial Review   Current issues with Current Psychotropic Meds;Current Anxiety/Panic      Family Dynamics   Good Support System? Yes    Comments Helem has general anxiety since she lost her husband and daughter. She lives alone and the pandemic has not helped with her anxiety. She can look to God and Son in-law for support. She has great neighbors and two really good friends at [name].      Barriers   Psychosocial barriers to participate in program The patient should benefit from training in stress management and relaxation.      Screening Interventions   Interventions Encouraged to exercise;To provide support and resources with identified psychosocial needs;Provide feedback about the scores to participant    Expected Outcomes Short Term goal: Utilizing psychosocial counselor, staff and physician to assist with identification of specific Stressors or current issues interfering with  healing process. Setting desired goal for each stressor or current issue identified.;Long Term Goal: Stressors or current issues are controlled or eliminated.;Short Term goal: Identification and review with participant of any Quality of Life or Depression concerns found by scoring the questionnaire.;Long Term goal: The participant improves quality of Life and PHQ9 Scores as seen by post scores and/or verbalization of changes           Quality of Life Scores:  Scores of 19 and below usually indicate a poorer quality of life in these areas.  A difference of  2-3 points is a clinically meaningful difference.  A difference of 2-3 points in the total score of the Quality of Life Index has been associated with significant improvement in overall quality of life, self-image, physical symptoms, and general health in studies assessing change in quality of life.  PHQ-9: Recent Review Flowsheet Data    Depression screen [facility] 2/9 05/03/2020   Decreased Interest 1   Down, Depressed, Hopeless 0   PHQ - 2 Score 1   Altered sleeping 0   Tired, decreased energy 3   Change in appetite 0  Feeling bad or failure about yourself  0   Trouble concentrating 3   Moving slowly or fidgety/restless 0   Suicidal thoughts 0   PHQ-9 Score 7     Interpretation of Total Score  Total Score Depression Severity:  1-4 = Minimal depression, 5-9 = Mild depression, 10-14 = Moderate depression, 15-19 = Moderately severe depression, 20-27 = Severe depression   Psychosocial Evaluation and Intervention:  Psychosocial Evaluation - 04/21/20 1548      Psychosocial Evaluation & Interventions   Interventions Encouraged to exercise with the program and follow exercise prescription;Relaxation education;Stress management education    Comments [name] [name] has general anxiety since she lost her husband and daughter. She lives alone and the pandemic has not helped with her anxiety. She can look to God and Son in-law for support. She has great  neighbors and two really good friends at [facility].    Expected Outcomes Short: Exercise regularly to support mental health and notify staff of any changes. Long: maintain mental health and well being through teaching of rehab or prescribed medications independently.    Continue Psychosocial Services  Follow up required by staff           Psychosocial Re-Evaluation:   Psychosocial Discharge (Final Psychosocial Re-Evaluation):   Education: Education Goals: Education classes will be provided on a weekly basis, covering required topics. Participant will state understanding/return demonstration of topics presented.  Learning Barriers/Preferences:  Learning Barriers/Preferences - 04/21/20 1544      Learning Barriers/Preferences   Learning Barriers None    Learning Preferences None           General Pulmonary Education Topics:  Infection Prevention: - Provides verbal and written material to individual with discussion of infection control including proper hand washing and proper equipment cleaning during exercise session. Flowsheet Row Pulmonary Rehab from 05/12/2020 in [facility] Pulmonary Rehab  Date 05/03/20  Educator AS  Instruction Review Code 1- Verbalizes Understanding      Falls Prevention: - Provides verbal and written material to individual with discussion of falls prevention and safety. Flowsheet Row Pulmonary Rehab from 05/12/2020 in [facility] Card[facility] and Pulmonary Rehab  Date 05/03/20  Educator AS  Instruction Review Code 1- Verbalizes Understanding      Chronic Lung Disease Review: - Group verbal instruction with posters, models, PowerPoint presentations and videos,  to review new updates, new respiratory medications, new advancements in procedures and treatments. Providing information on websites and "800" numbers for continued self-education. Includes information about supplement oxygen, available portable oxygen systems, continuous and intermittent flow rates,  oxygen safety, concentrators, and Medicare reimbursement for oxygen. Explanation of Pulmonary Drugs, including class, frequency, complications, importance of spacers, rinsing mouth after steroid MDI's, and proper cleaning methods for nebulizers. Review of basic lung anatomy and physiology related to function, structure, and complications of lung disease. Review of risk factors. Discussion about methods for diagnosing sleep apnea and types of masks and machines for OSA. Includes a review of the use of types of environmental controls: home humidity, furnaces, filters, dust mite/pet prevention, HEPA vacuums. Discussion about weather changes, air quality and the benefits of nasal washing. Instruction on Warning signs, infection symptoms, calling MD promptly, preventive modes, and value of vaccinations. Review of effective airway clearance, coughing and/or vibration techniques. Emphasizing that all should Create an Action Plan. Written material given at graduation.   AED/CPR: - Group verbal and written instruction with the use of models to demonstrate the basic use of the AED with the basic ABC's  of resuscitation.    Anatomy and Cardiac Procedures: - Group verbal and visual presentation and models provide information about basic cardiac anatomy and function. Reviews the testing methods done to diagnose heart disease and the outcomes of the test results. Describes the treatment choices: Medical Management, Angioplasty, or Coronary Bypass Surgery for treating various heart conditions including Myocardial Infarction, Angina, Valve Disease, and Cardiac Arrhythmias.  Written material given at graduation.   Medication Safety: - Group verbal and visual instruction to review commonly prescribed medications for heart and lung disease. Reviews the medication, class of the drug, and side effects. Includes the steps to properly store meds and maintain the prescription regimen.  Written material given at  graduation.   Other: -Provides group and verbal instruction on various topics (see comments)   Knowledge Questionnaire Score:  Knowledge Questionnaire Score - 05/03/20 1710      Knowledge Questionnaire Score   Pre Score 16/18            Core Components/Risk Factors/Patient Goals at Admission:  Personal Goals and Risk Factors at Admission - 05/03/20 1712      Core Components/Risk Factors/Patient Goals on Admission    Weight Management Yes;Weight Loss    Intervention Weight Management: Develop a combined nutrition and exercise program designed to reach desired caloric intake, while maintaining appropriate intake of nutrient and fiber, sodium and fats, and appropriate energy expenditure required for the weight goal.;Weight Management: Provide education and appropriate resources to help participant work on and attain dietary goals.;Weight Management/Obesity: Establish reasonable short term and long term weight goals.    Expected Outcomes Short Term: Continue to assess and modify interventions until short term weight is achieved;Long Term: Adherence to nutrition and physical activity/exercise program aimed toward attainment of established weight goal;Weight Loss: Understanding of general recommendations for a balanced deficit meal plan, which promotes 1-2 lb weight loss per week and includes a negative energy balance of [phone] kcal/d;Understanding recommendations for meals to include 15-35% energy as protein, 25-35% energy from fat, 35-60% energy from carbohydrates, less than 200mg  of dietary cholesterol, 20-35 gm of total fiber daily;Understanding of distribution of calorie intake throughout the day with the consumption of 4-5 meals/snacks    Improve shortness of breath with ADL's Yes    Intervention Provide education, individualized exercise plan and daily activity instruction to help decrease symptoms of SOB with activities of daily living.    Expected Outcomes Short Term: Improve  cardiorespiratory fitness to achieve a reduction of symptoms when performing ADLs;Long Term: Be able to perform more ADLs without symptoms or delay the onset of symptoms    Hypertension Yes    Intervention Provide education on lifestyle modifcations including regular physical activity/exercise, weight management, moderate sodium restriction and increased consumption of fresh fruit, vegetables, and low fat dairy, alcohol moderation, and smoking cessation.;Monitor prescription use compliance.    Expected Outcomes Short Term: Continued assessment and intervention until BP is < 140/85mm HG in hypertensive participants. < 130/29mm HG in hypertensive participants with diabetes, heart failure or chronic kidney disease.;Long Term: Maintenance of blood pressure at goal levels.    Lipids Yes    Intervention Provide education and support for participant on nutrition & aerobic/resistive exercise along with prescribed medications to achieve LDL 70mg , HDL >40mg .    Expected Outcomes Short Term: Participant states understanding of desired cholesterol values and is compliant with medications prescribed. Participant is following exercise prescription and nutrition guidelines.;Long Term: Cholesterol controlled with medications as prescribed, with individualized exercise RX and with personalized nutrition  plan. Value goals: LDL < 70mg , HDL > 40 mg.           Education:Diabetes - Individual verbal and written instruction to review signs/symptoms of diabetes, desired ranges of glucose level fasting, after meals and with exercise. Acknowledge that pre and post exercise glucose checks will be done for 3 sessions at entry of program.   Know Your Numbers and Heart Failure: - Group verbal and visual instruction to discuss disease risk factors for cardiac and pulmonary disease and treatment options.  Reviews associated critical values for Overweight/Obesity, Hypertension, Cholesterol, and Diabetes.  Discusses basics of heart  failure: signs/symptoms and treatments.  Introduces Heart Failure Zone chart for action plan for heart failure.  Written material given at graduation.   Core Components/Risk Factors/Patient Goals Review:    Core Components/Risk Factors/Patient Goals at Discharge (Final Review):    ITP Comments:  ITP Comments    Row Name 04/21/20 1551 05/03/20 1716 05/12/20 1539 05/17/20 1418 05/26/20 0754   ITP Comments Virtual Visit completed. Patient informed on EP and RD appointment and 6 Minute walk test. Patient also informed of patient health questionnaires on My Chart. Patient Verbalizes understanding. Visit diagnosis can be found in [facility] 04/05/2020. Completed 6MWT and gym orientation. Initial ITP created and sent for review to Dr. Emily Filbert, Medical Director. First full day of exercise!  Patient was oriented to gym and equipment including functions, settings, policies, and procedures.  Patient's individual exercise prescription and treatment plan were reviewed.  All starting workloads were established based on the results of the 6 minute walk test done at initial orientation visit.  The plan for exercise progression was also introduced and progression will be customized based on patient's performance and goals. Completed initial RD Evaluation 30 Day review completed. Medical Director ITP review done, changes made as directed, and signed approval by Medical Director.          Comments:

## 2020-05-30 ENCOUNTER — Other Ambulatory Visit: Payer: Self-pay | Admitting: *Deleted

## 2020-05-30 DIAGNOSIS — D500 Iron deficiency anemia secondary to blood loss (chronic): Secondary | ICD-10-CM

## 2020-05-31 ENCOUNTER — Other Ambulatory Visit: Payer: Self-pay | Admitting: *Deleted

## 2020-05-31 ENCOUNTER — Inpatient Hospital Stay: Payer: Medicare Other | Attending: Oncology

## 2020-05-31 ENCOUNTER — Other Ambulatory Visit: Payer: Self-pay

## 2020-05-31 ENCOUNTER — Inpatient Hospital Stay (HOSPITAL_BASED_OUTPATIENT_CLINIC_OR_DEPARTMENT_OTHER): Payer: Medicare Other | Admitting: Oncology

## 2020-05-31 ENCOUNTER — Inpatient Hospital Stay: Payer: Medicare Other

## 2020-05-31 VITALS — BP 154/60 | HR 76 | Temp 98.3°F | Resp 16 | Ht 67.0 in | Wt 183.5 lb

## 2020-05-31 DIAGNOSIS — D500 Iron deficiency anemia secondary to blood loss (chronic): Secondary | ICD-10-CM

## 2020-05-31 DIAGNOSIS — E785 Hyperlipidemia, unspecified: Secondary | ICD-10-CM | POA: Insufficient documentation

## 2020-05-31 DIAGNOSIS — R0602 Shortness of breath: Secondary | ICD-10-CM | POA: Insufficient documentation

## 2020-05-31 DIAGNOSIS — D509 Iron deficiency anemia, unspecified: Secondary | ICD-10-CM

## 2020-05-31 DIAGNOSIS — Z87891 Personal history of nicotine dependence: Secondary | ICD-10-CM | POA: Diagnosis not present

## 2020-05-31 DIAGNOSIS — J449 Chronic obstructive pulmonary disease, unspecified: Secondary | ICD-10-CM | POA: Diagnosis not present

## 2020-05-31 DIAGNOSIS — K219 Gastro-esophageal reflux disease without esophagitis: Secondary | ICD-10-CM | POA: Diagnosis not present

## 2020-05-31 DIAGNOSIS — I10 Essential (primary) hypertension: Secondary | ICD-10-CM | POA: Diagnosis not present

## 2020-05-31 DIAGNOSIS — Z79899 Other long term (current) drug therapy: Secondary | ICD-10-CM | POA: Insufficient documentation

## 2020-05-31 DIAGNOSIS — R5383 Other fatigue: Secondary | ICD-10-CM | POA: Insufficient documentation

## 2020-05-31 LAB — CBC
HCT: 26.1 % — ABNORMAL LOW (ref 36.0–46.0)
Hemoglobin: 8.2 g/dL — ABNORMAL LOW (ref 12.0–15.0)
MCH: 23.5 pg — ABNORMAL LOW (ref 26.0–34.0)
MCHC: 31.4 g/dL (ref 30.0–36.0)
MCV: 74.8 fL — ABNORMAL LOW (ref 80.0–100.0)
Platelets: 427 10*3/uL — ABNORMAL HIGH (ref 150–400)
RBC: 3.49 MIL/uL — ABNORMAL LOW (ref 3.87–5.11)
RDW: 15.3 % (ref 11.5–15.5)
WBC: 10.4 10*3/uL (ref 4.0–10.5)
nRBC: 0 % (ref 0.0–0.2)

## 2020-05-31 LAB — IRON AND TIBC
Iron: 15 ug/dL — ABNORMAL LOW (ref 28–170)
Saturation Ratios: 4 % — ABNORMAL LOW (ref 10.4–31.8)
TIBC: 379 ug/dL (ref 250–450)
UIBC: 364 ug/dL

## 2020-05-31 LAB — FERRITIN: Ferritin: 17 ng/mL (ref 11–307)

## 2020-05-31 MED ORDER — SODIUM CHLORIDE 0.9% FLUSH
10.0000 mL | Freq: Once | INTRAVENOUS | Status: DC
  Filled 2020-05-31: qty 10

## 2020-05-31 MED ORDER — SODIUM CHLORIDE 0.9 % IV SOLN
510.0000 mg | Freq: Once | INTRAVENOUS | Status: AC
  Administered 2020-05-31: 510 mg via INTRAVENOUS
  Filled 2020-05-31: qty 510

## 2020-05-31 MED ORDER — SODIUM CHLORIDE 0.9 % IV SOLN
INTRAVENOUS | Status: DC
  Filled 2020-05-31: qty 250

## 2020-05-31 NOTE — Progress Notes (Signed)
Pt has not recovered from having pneumonia and still sob with exertion. Has weakness and wondering about her labs.

## 2020-05-31 NOTE — Progress Notes (Signed)
Hematology/Oncology Consult note [facility]  [phone] [phone]  Patient Care Team: Kirk Ruths, MD as PCP - General (Internal Medicine) Sindy Guadeloupe, MD as Consulting Physician (Hematology and Oncology)   Name of the patient: [name] [name]  UT:[id number]  [birth year]   Date of visit: 05/31/20  Diagnosis- iron deficiency anemia  Chief complaint/ Reason for visit- routine f/u of iron deficiency anemia  Heme/Onc history:  Patient is a 87 year old female who was referred to the clinic in October 2017 for chronic iron deficiency anemia with history of diverticulosis. She has been unable to tolerate oral iron supplements due to abdominal cramping and diarrhea. She has required several blood transfusions in the past and received regular IV iron infusions. She has tolerated these without complications. Iron and blood transfusions have been given on a palliative basis with a goal hemoglobin of >10.   March 2018 she underwent an EGD and colonoscopy which did not reveal source of bleeding.  May 2/18 - enteroscopy revealed 3 avms which were ablated. She suffered melena and had a hemoglobin of 6.7 and was hospitalized. At that time, enteroscopy was repeated which was normal. She received a blood transfusion and her hemoglobin was 9.6.   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   08/07/2016 Capsule study showed active bleeding from single spot in proximal jejunum. She underwent push enteroscopy with argon laser therapy but no bleeding was found  Interval history- Feels fatigued and has exertional sob. Denies other complaints  ECOG PS- 1 Pain scale- 0  Review of systems- Review of Systems  Constitutional: Positive for malaise/fatigue. Negative for chills, fever and weight loss.  HENT: Negative for congestion, ear discharge and nosebleeds.   Eyes: Negative for blurred  vision.  Respiratory: Positive for shortness of breath. Negative for cough, hemoptysis, sputum production and wheezing.   Cardiovascular: Negative for chest pain, palpitations, orthopnea and claudication.  Gastrointestinal: Negative for abdominal pain, blood in stool, constipation, diarrhea, heartburn, melena, nausea and vomiting.  Genitourinary: Negative for dysuria, flank pain, frequency, hematuria and urgency.  Musculoskeletal: Negative for back pain, joint pain and myalgias.  Skin: Negative for rash.  Neurological: Negative for dizziness, tingling, focal weakness, seizures, weakness and headaches.  Endo/Heme/Allergies: Does not bruise/bleed easily.  Psychiatric/Behavioral: Negative for depression and suicidal ideas. The patient does not have insomnia.       Allergies  Allergen Reactions  . Nsaids Hives  . Dextrans Hives  . Fentanyl Itching  . Lipitor [Atorvastatin] Other (See Comments)    Reaction: Muscle pain  . Lovastatin   . Mobic [Meloxicam] Other (See Comments)    Reaction: Mouth and tongue ulcers  . Niacin And Related Itching  . Other   . Polysaccharide K Hives  . Pravachol [Pravastatin Sodium] Other (See Comments)    Reaction: Muscle pain  . Statins Other (See Comments)  . Tolmetin Hives  . Voltaren [Diclofenac Sodium] Other (See Comments)    Reaction: Mouth and tongue ulcers  . Zetia [Ezetimibe] Other (See Comments)    Reaction: Leg pain  . Codeine Nausea And Vomiting  . Niacin Itching     Past Medical History:  Diagnosis Date  . Actinic keratosis   . Anemia   . COPD (chronic obstructive pulmonary disease) ([place])   . Diverticulitis   . GERD (gastroesophageal reflux disease)   . Glaucoma   . HLD (hyperlipidemia)   . Hypertension   . Iron deficiency   .  Rectal bleeding   . Skin cancer    Nose, txted by Dr. Valere Dross at [facility]     Past Surgical History:  Procedure Laterality Date  . ABDOMINAL HYSTERECTOMY    . APPENDECTOMY    . COLONOSCOPY WITH PROPOFOL  N/A 04/14/2016   Procedure: COLONOSCOPY WITH PROPOFOL;  Surgeon: Lucilla Lame, MD;  Location: [facility] ENDOSCOPY;  Service: Endoscopy;  Laterality: N/A;  . ENTEROSCOPY N/A 05/10/2016   Procedure: Push ENTEROSCOPY with pediatric colonoscope;  Surgeon: [name], MD;  Location: [facility] ENDOSCOPY;  Service: Endoscopy;  Laterality: N/A;  . ENTEROSCOPY N/A 05/30/2016   Procedure: push ENTEROSCOPY;  Surgeon: [name], MD;  Location: [facility] ENDOSCOPY;  Service: Endoscopy;  Laterality: N/A;  . ENTEROSCOPY N/A 07/05/2017   Procedure: ENTEROSCOPY;  Surgeon: [name], MD;  Location: [facility] ENDOSCOPY;  Service: Gastroenterology;  Laterality: N/A;  . ENTEROSCOPY N/A 06/21/2018   Procedure: ENTEROSCOPY;  Surgeon: [name], MD;  Location: [facility] ENDOSCOPY;  Service: Gastroenterology;  Laterality: N/A;  . ESOPHAGOGASTRODUODENOSCOPY (EGD) WITH PROPOFOL N/A 08/12/2015   Procedure: ESOPHAGOGASTRODUODENOSCOPY (EGD) WITH PROPOFOL;  Surgeon: Lollie Sails, MD;  Location: [facility] ENDOSCOPY;  Service: Endoscopy;  Laterality: N/A;  . ESOPHAGOGASTRODUODENOSCOPY (EGD) WITH PROPOFOL N/A 04/03/2016   Procedure: ESOPHAGOGASTRODUODENOSCOPY (EGD) WITH PROPOFOL;  Surgeon: Lucilla Lame, MD;  Location: [facility] ENDOSCOPY;  Service: Endoscopy;  Laterality: N/A;  . GIVENS CAPSULE STUDY N/A 04/28/2016   Procedure: GIVENS CAPSULE STUDY;  Surgeon: [name], MD;  Location: [facility] ENDOSCOPY;  Service: Endoscopy;  Laterality: N/A;  . GIVENS CAPSULE STUDY N/A 06/21/2017   Procedure: GIVENS CAPSULE STUDY;  Surgeon: [name], MD;  Location: [facility] ENDOSCOPY;  Service: Gastroenterology;  Laterality: N/A;  . JOINT REPLACEMENT      Social History   Socioeconomic History  . Marital status: Widowed    Spouse name: Not on file  . Number of children: Not on file  . Years of education: Not on file  . Highest education level: Not on file  Occupational History  . Not on file  Tobacco Use  . Smoking status: Former Smoker    Packs/day: 2.00    Years: 20.00     Pack years: 40.00    Types: Cigarettes    Quit date: 04/22/1979    Years since quitting: 41.1  . Smokeless tobacco: Never Used  Vaping Use  . Vaping Use: Never used  Substance and Sexual Activity  . Alcohol use: Yes    Alcohol/week: 1.0 standard drink    Types: 1 Glasses of wine per week    Comment: glass wine every 2 months  . Drug use: No  . Sexual activity: Not Currently    Birth control/protection: Post-menopausal  Other Topics Concern  . Not on file  Social History Narrative  . Not on file   Social Determinants of Health   Financial Resource Strain: Not on file  Food Insecurity: Not on file  Transportation Needs: Not on file  Physical Activity: Not on file  Stress: Not on file  Social Connections: Not on file  Intimate Partner Violence: Not on file    Family History  Problem Relation Age of Onset  . CAD Mother   . CAD Father   . CAD Brother      Current Outpatient Medications:  .  acetaminophen (TYLENOL) 500 MG tablet, Take 500-1,000 mg by mouth every 6 (six) hours as needed for mild pain, moderate pain or fever., Disp: , Rfl:  .  acidophilus (RISAQUAD) CAPS capsule, Take 1 capsule by mouth  daily. (Patient taking differently: Take 1 capsule by mouth in the morning and at bedtime.), Disp: 14 capsule, Rfl: 0 .  chlorthalidone (HYGROTON) 25 MG tablet, Take 0.5 tablets (12.5 mg total) by mouth daily. May resume on 11/1 if diarrhea resolves, Disp: , Rfl:  .  Cyanocobalamin (B-12) 1000 MCG CAPS, Take 2,000 Units by mouth daily. , Disp: , Rfl:  .  cycloSPORINE (RESTASIS) 0.05 % ophthalmic emulsion, Place 1 drop into both eyes 2 (two) times daily., Disp: , Rfl:  .  escitalopram (LEXAPRO) 20 MG tablet, Take 20 mg by mouth daily., Disp: , Rfl:  .  fluticasone (FLONASE) 50 MCG/ACT nasal spray, Place 1 spray into the nose daily as needed., Disp: , Rfl:  .  gabapentin (NEURONTIN) 300 MG capsule, Take 300 mg by mouth 2 (two) times daily., Disp: , Rfl:  .  hydrochlorothiazide  (HYDRODIURIL) 12.5 MG tablet, Take 12.5 mg by mouth daily., Disp: , Rfl:  .  ipratropium-albuterol (DUONEB) 0.5-2.5 (3) MG/3ML SOLN, Inhale into the lungs., Disp: , Rfl:  .  losartan (COZAAR) 50 MG tablet, Take 50 mg by mouth daily., Disp: , Rfl:  .  mometasone (ELOCON) 0.1 % lotion, Apply 1 application topically daily as needed (skin irritation)., Disp: , Rfl:  .  Multiple Vitamins-Minerals (OCUVITE PRESERVISION PO), Take 1 tablet by mouth 2 (two) times daily., Disp: , Rfl:  .  potassium chloride (KLOR-CON) 10 MEQ tablet, Take 10 mEq by mouth 3 (three) times daily., Disp: , Rfl:  .  RABEprazole (ACIPHEX) 20 MG tablet, Take 20 mg by mouth 2 (two) times daily., Disp: , Rfl:  No current facility-administered medications for this visit.  Facility-Administered Medications Ordered in Other Visits:  .  0.9 %  sodium chloride infusion, , Intravenous, Continuous, Sindy Guadeloupe, MD, Last Rate: 0 mL/hr at 10/08/17 1518, New Bag at 06/21/18 0952 .  0.9 %  sodium chloride infusion, , Intravenous, Continuous, Sindy Guadeloupe, MD, Stopped at 05/31/20 1301  Physical exam:  Vitals:   05/31/20 1129 05/31/20 1137  BP: (!) 154/60   Pulse: 76   Resp: 16   Temp: 98.3 F (36.8 C)   TempSrc: Oral   SpO2:  97%  Weight: 183 lb 8 oz (83.2 kg)   Height: 5\' 7"  (1.702 m)    Physical Exam Constitutional:      Comments: Sitting ina wheelchair. Appears in no acute distress  Cardiovascular:     Rate and Rhythm: Normal rate and regular rhythm.     Heart sounds: Normal heart sounds.  Pulmonary:     Effort: Pulmonary effort is normal.     Breath sounds: Normal breath sounds.  Abdominal:     General: Bowel sounds are normal.     Palpations: Abdomen is soft.  Skin:    General: Skin is warm and dry.  Neurological:     Mental Status: She is alert and oriented to person, place, and time.      CMP Latest Ref Rng & Units 11/07/2019  Glucose 70 - 99 mg/dL 103(H)  BUN 8 - 23 mg/dL 19  Creatinine 0.44 - 1.00 mg/dL  0.59  Sodium 135 - 145 mmol/L 131(L)  Potassium 3.5 - 5.1 mmol/L 3.8  Chloride 98 - 111 mmol/L 97(L)  CO2 22 - 32 mmol/L 25  Calcium 8.9 - 10.3 mg/dL 8.6(L)  Total Protein 6.5 - 8.1 g/dL -  Total Bilirubin 0.3 - 1.2 mg/dL -  Alkaline Phos 38 - 126 U/L -  AST 15 -  41 U/L -  ALT 0 - 44 U/L -   CBC Latest Ref Rng & Units 05/31/2020  WBC 4.0 - 10.5 K/uL 10.4  Hemoglobin 12.0 - 15.0 g/dL 8.2(L)  Hematocrit 36.0 - 46.0 % 26.1(L)  Platelets 150 - 400 K/uL 427(H)      Assessment and plan- Patient is a 87 y.o. female With history of iron deficiency anemia here for routine follow-up.  Patient last received IV iron in January 2022.  However her hemoglobin has not improved and she is still 8.2 today with an MCV of 74 and a platelet count is mildly elevated at 427 likely secondary to iron deficiency.  Ferritin levels are still low at 17 with low iron saturation of 4%.  She would therefore benefit with 2 more doses of Feraheme which she will get pulse dose today.  Repeat CBC ferritin and iron studies in 2 in 4 months and I will see her back in 4 months.  Interim CBC to be checked in 1 month.  Etiology of iron deficiency anemia possibly secondary to GI bleeding.  She has undergone extensive GI work-up in the past including endoscopy colonoscopy capsule study and double-balloon enteroscopy.  GI unlikely to pursue any invasive investigation at this time.  We will therefore continue to support her with IV iron   Visit Diagnosis 1. Iron deficiency anemia, unspecified iron deficiency anemia type      Dr. Randa Evens, MD, MPH [facility] at [facility] [phone] 05/31/2020 4:29 PM

## 2020-05-31 NOTE — Patient Instructions (Signed)
[facility]  Discharge Instructions: Thank you for choosing [facility] to provide your oncology and hematology care.     We strive to give you quality time with your provider. You may need to reschedule your appointment if you arrive late (15 or more minutes).  Arriving late affects you and other patients whose appointments are after yours.  Also, if you miss three or more appointments without notifying the office, you may be dismissed from the clinic at the provider's discretion.      For prescription refill requests, have your pharmacy contact our office and allow 72 hours for refills to be completed.    Today you received the following Feraheme.     Should you have questions after your visit or need to cancel or reschedule your appointment, please contact [facility]  [phone] and follow the prompts.  Office hours are 8:00 a.m. to 4:30 p.m. Monday - Friday. Please note that voicemails left after 4:00 p.m. may not be returned until the following business day.  We are closed weekends and major holidays. You have access to a nurse at all times for urgent questions. Please call the main number to the clinic [phone] and follow the prompts.  For any non-urgent questions, you may also contact your provider using MyChart. We now offer e-Visits for anyone 4 and older to request care online for non-urgent symptoms. For details visit [email or web address].   Also download the MyChart app! Go to the app store, search "MyChart", open the app, select [facility], and log in with your MyChart username and password.  Due to Covid, a mask is required upon entering the hospital/clinic. If you do not have a mask, one will be given to you upon arrival. For doctor visits, patients may have 1 support person aged 40 or older with them. For treatment visits, patients cannot have anyone with them due to current Covid  guidelines and our immunocompromised population.

## 2020-06-10 ENCOUNTER — Encounter: Payer: Self-pay | Admitting: *Deleted

## 2020-06-10 ENCOUNTER — Telehealth: Payer: Self-pay | Admitting: *Deleted

## 2020-06-10 ENCOUNTER — Inpatient Hospital Stay: Payer: Medicare Other | Attending: Oncology

## 2020-06-10 DIAGNOSIS — Z79899 Other long term (current) drug therapy: Secondary | ICD-10-CM | POA: Insufficient documentation

## 2020-06-10 DIAGNOSIS — D509 Iron deficiency anemia, unspecified: Secondary | ICD-10-CM | POA: Diagnosis present

## 2020-06-10 DIAGNOSIS — D500 Iron deficiency anemia secondary to blood loss (chronic): Secondary | ICD-10-CM

## 2020-06-10 DIAGNOSIS — J449 Chronic obstructive pulmonary disease, unspecified: Secondary | ICD-10-CM

## 2020-06-10 MED ORDER — SODIUM CHLORIDE 0.9 % IV SOLN
INTRAVENOUS | Status: DC
  Filled 2020-06-10: qty 250

## 2020-06-10 MED ORDER — SODIUM CHLORIDE 0.9 % IV SOLN
510.0000 mg | Freq: Once | INTRAVENOUS | Status: AC
  Administered 2020-06-10: 510 mg via INTRAVENOUS
  Filled 2020-06-10: qty 510
  Filled 2020-06-10: qty 17

## 2020-06-10 NOTE — Patient Instructions (Signed)
[facility]  Discharge Instructions: Thank you for choosing [facility] to provide your oncology and hematology care.  If you have a lab appointment with the Cancer Center, please go directly to the Cancer Center and check in at the registration area.  Wear comfortable clothing and clothing appropriate for easy access to any Portacath or PICC line.   We strive to give you quality time with your provider. You may need to reschedule your appointment if you arrive late (15 or more minutes).  Arriving late affects you and other patients whose appointments are after yours.  Also, if you miss three or more appointments without notifying the office, you may be dismissed from the clinic at the provider's discretion.      For prescription refill requests, have your pharmacy contact our office and allow 72 hours for refills to be completed.    Today you received the following : Feraheme   To help prevent nausea and vomiting after your treatment, we encourage you to take your nausea medication as directed.  BELOW ARE SYMPTOMS THAT SHOULD BE REPORTED IMMEDIATELY: *FEVER GREATER THAN 100.4 F (38 C) OR HIGHER *CHILLS OR SWEATING *NAUSEA AND VOMITING THAT IS NOT CONTROLLED WITH YOUR NAUSEA MEDICATION *UNUSUAL SHORTNESS OF BREATH *UNUSUAL BRUISING OR BLEEDING *URINARY PROBLEMS (pain or burning when urinating, or frequent urination) *BOWEL PROBLEMS (unusual diarrhea, constipation, pain near the anus) TENDERNESS IN MOUTH AND THROAT WITH OR WITHOUT PRESENCE OF ULCERS (sore throat, sores in mouth, or a toothache) UNUSUAL RASH, SWELLING OR PAIN  UNUSUAL VAGINAL DISCHARGE OR ITCHING   Items with * indicate a potential emergency and should be followed up as soon as possible or go to the Emergency Department if any problems should occur.  Please show the CHEMOTHERAPY ALERT CARD or IMMUNOTHERAPY ALERT CARD at check-in to the Emergency Department and triage  nurse.  Should you have questions after your visit or need to cancel or reschedule your appointment, please contact [facility] [place] [facility]  [phone] and follow the prompts.  Office hours are 8:00 a.m. to 4:30 p.m. Monday - Friday. Please note that voicemails left after 4:00 p.m. may not be returned until the following business day.  We are closed weekends and major holidays. You have access to a nurse at all times for urgent questions. Please call the main number to the clinic [phone] and follow the prompts.  For any non-urgent questions, you may also contact your provider using MyChart. We now offer e-Visits for anyone 18 and older to request care online for non-urgent symptoms. For details visit [email or web address].   Also download the MyChart app! Go to the app store, search "MyChart", open the app, select [facility], and log in with your MyChart username and password.  Due to Covid, a mask is required upon entering the hospital/clinic. If you do not have a mask, one will be given to you upon arrival. For doctor visits, patients may have 1 support person aged 18 or older with them. For treatment visits, patients cannot have anyone with them due to current Covid guidelines and our immunocompromised population.  

## 2020-06-10 NOTE — Telephone Encounter (Signed)
Called to check on patient.  She has another infusion today and really lacking energy.  She is aiming to come in on Monday. Out since 05/19/20.

## 2020-06-14 ENCOUNTER — Other Ambulatory Visit: Payer: Self-pay

## 2020-06-14 ENCOUNTER — Encounter: Payer: Medicare Other | Attending: Pulmonary Disease

## 2020-06-14 DIAGNOSIS — J449 Chronic obstructive pulmonary disease, unspecified: Secondary | ICD-10-CM

## 2020-06-14 NOTE — Progress Notes (Signed)
Daily Session Note  Patient Details  Name: [name] [name] MRN: [id number] Date of Birth: 03-06-1925 Referring Provider:   Flowsheet Row Pulmonary Rehab from 05/03/2020 in [facility] Cardiac and Pulmonary Rehab  Referring Provider [name]      Encounter Date: 06/14/2020  Check In:  Session Check In - 06/14/20 1603      Check-In   Supervising physician immediately available to respond to emergencies See telemetry face sheet for immediately available ER MD    Location [facility]-Cardiac & Pulmonary Rehab    Staff Present Birdie Sons, MPA, RN;Melissa Linnell Camp RDN, Wilhelmina Mcardle, BS, ACSM CEP, Exercise Physiologist;Jessica Lane, MA, RCEP, CCRP, CCET    Virtual Visit No    Medication changes reported     No    Fall or balance concerns reported    No    Warm-up and Cool-down Performed on first and last piece of equipment    Resistance Training Performed Yes    VAD Patient? No    PAD/SET Patient? No      Pain Assessment   Currently in Pain? No/denies              Social History   Tobacco Use  Smoking Status Former Smoker  . Packs/day: 2.00  . Years: 20.00  . Pack years: 40.00  . Types: Cigarettes  . Quit date: 04/22/1979  . Years since quitting: 41.1  Smokeless Tobacco Never Used    Goals Met:  Independence with exercise equipment Exercise tolerated well No report of cardiac concerns or symptoms Strength training completed today  Goals Unmet:  Not Applicable  Comments: Pt able to follow exercise prescription today without complaint.  Will continue to monitor for progression.    Dr. Emily Filbert is Medical Director for [facility].  Dr. Ottie Glazier is Medical Director for [facility] Pulmonary Rehabilitation.

## 2020-06-21 ENCOUNTER — Encounter: Payer: Medicare Other | Admitting: *Deleted

## 2020-06-21 ENCOUNTER — Other Ambulatory Visit: Payer: Self-pay

## 2020-06-21 DIAGNOSIS — J449 Chronic obstructive pulmonary disease, unspecified: Secondary | ICD-10-CM | POA: Diagnosis not present

## 2020-06-21 NOTE — Progress Notes (Signed)
Daily Session Note  Patient Details  Name: [name] [name] MRN: [id number] Date of Birth: [birth year] Referring Provider:   Flowsheet Row Pulmonary Rehab from 05/03/2020 in [facility] Cardiac and Pulmonary Rehab  Referring Provider [name]       Encounter Date: 06/21/2020  Check In:  Session Check In - 06/21/20 [place]       Check-In   Supervising physician immediately available to respond to emergencies See telemetry face sheet for immediately available ER MD    Location [facility]-Cardiac & Pulmonary Rehab    Staff Present Heath Lark, RN, BSN, [employer], MPA, Nino Glow, MS, ASCM CEP, Exercise Physiologist;Melissa Caiola RDN, LDN    Virtual Visit No    Medication changes reported     No    Fall or balance concerns reported    No    Warm-up and Cool-down Performed on first and last piece of equipment    Resistance Training Performed Yes    VAD Patient? No    PAD/SET Patient? No      Pain Assessment   Currently in Pain? No/denies                Social History   Tobacco Use  Smoking Status Former   Packs/day: 2.00   Years: 20.00   Pack years: 40.00   Types: Cigarettes   Quit date: 04/22/1979   Years since quitting: 41.1  Smokeless Tobacco Never    Goals Met:  Proper associated with RPD/PD & O2 Sat Independence with exercise equipment Exercise tolerated well No report of cardiac concerns or symptoms  Goals Unmet:  Not Applicable  Comments: Pt able to follow exercise prescription today without complaint.  Will continue to monitor for progression.    Dr. Emily Filbert is Medical Director for [facility].  Dr. Ottie Glazier is Medical Director for [facility] Pulmonary Rehabilitation.

## 2020-06-23 ENCOUNTER — Encounter: Payer: Self-pay | Admitting: *Deleted

## 2020-06-23 DIAGNOSIS — J449 Chronic obstructive pulmonary disease, unspecified: Secondary | ICD-10-CM

## 2020-06-23 NOTE — Progress Notes (Signed)
Pulmonary Individual Treatment Plan  Patient Details  Name: [name] [name] MRN: [id number] Date of Birth: 07-01-1925 Referring Provider:   Flowsheet Row Pulmonary Rehab from 05/03/2020 in [facility] [place] [facility]  Referring Provider Aleskerov       Initial Encounter Date:  Flowsheet Row Pulmonary Rehab from 05/03/2020 in [facility] Cardiac and Pulmonary Rehab  Date 05/03/20       Visit Diagnosis: Chronic obstructive pulmonary disease, unspecified COPD type ([place])  Patient's Home Medications on Admission:  Current Outpatient Medications:    acetaminophen (TYLENOL) 500 MG tablet, Take 500-1,000 mg by mouth every 6 (six) hours as needed for mild pain, moderate pain or fever., Disp: , Rfl:    acidophilus (RISAQUAD) CAPS capsule, Take 1 capsule by mouth daily. (Patient taking differently: Take 1 capsule by mouth in the morning and at bedtime.), Disp: 14 capsule, Rfl: 0   chlorthalidone (HYGROTON) 25 MG tablet, Take 0.5 tablets (12.5 mg total) by mouth daily. May resume on 11/1 if diarrhea resolves, Disp: , Rfl:    Cyanocobalamin (B-12) 1000 MCG CAPS, Take 2,000 Units by mouth daily. , Disp: , Rfl:    cycloSPORINE (RESTASIS) 0.05 % ophthalmic emulsion, Place 1 drop into both eyes 2 (two) times daily., Disp: , Rfl:    escitalopram (LEXAPRO) 20 MG tablet, Take 20 mg by mouth daily., Disp: , Rfl:    fluticasone (FLONASE) 50 MCG/ACT nasal spray, Place 1 spray into the nose daily as needed., Disp: , Rfl:    gabapentin (NEURONTIN) 300 MG capsule, Take 300 mg by mouth 2 (two) times daily., Disp: , Rfl:    hydrochlorothiazide (HYDRODIURIL) 12.5 MG tablet, Take 12.5 mg by mouth daily., Disp: , Rfl:    ipratropium-albuterol (DUONEB) 0.5-2.5 (3) MG/3ML SOLN, Inhale into the lungs., Disp: , Rfl:    losartan (COZAAR) 50 MG tablet, Take 50 mg by mouth daily., Disp: , Rfl:    mometasone (ELOCON) 0.1 % lotion, Apply 1 application topically daily as needed (skin irritation)., Disp: , Rfl:     Multiple Vitamins-Minerals (OCUVITE PRESERVISION PO), Take 1 tablet by mouth 2 (two) times daily., Disp: , Rfl:    potassium chloride (KLOR-CON) 10 MEQ tablet, Take 10 mEq by mouth 3 (three) times daily., Disp: , Rfl:    RABEprazole (ACIPHEX) 20 MG tablet, Take 20 mg by mouth 2 (two) times daily., Disp: , Rfl:  No current facility-administered medications for this visit.  Facility-Administered Medications Ordered in Other Visits:    0.9 %  sodium chloride infusion, , Intravenous, Continuous, Sindy Guadeloupe, MD, Last Rate: 0 mL/hr at 10/08/17 1518, New Bag at 06/21/18 4562  Past Medical History: Past Medical History:  Diagnosis Date   Actinic keratosis    Anemia    COPD (chronic obstructive pulmonary disease) (HCC)    Diverticulitis    GERD (gastroesophageal reflux disease)    Glaucoma    HLD (hyperlipidemia)    Hypertension    Iron deficiency    Rectal bleeding    Skin cancer    Nose, txt[facility] by Dr[facility] Valere Dross [facility]    Tobacco Use: Social History   Tobacco Use  Smoking Status Former   Packs/day: 2.00   Years: 20.00   Pack years: 40.00   Types: Cigarettes   Quit date: 04/22/1979   Years since quitting: 41.2  Smokeless Tobacco Never    Labs: Recent Review Flowsheet Data   There is no flowsheet data to display.      Pulmonary Assessment Scores:  Pulmonary Assessment  Scores     Row Name 05/03/20 1711         ADL UCSD   SOB Score total 70     Rest 0     Walk 2     Stairs 5     Bath 0     Dress 3     Shop 5           CAT Score     CAT Score 20             UCSD: Self-administered rating of dyspnea associated with activities of daily living (ADLs) 6-point scale (0 = "not at all" to 5 = "maximal or unable to do because of breathlessness")  Scoring Scores range from 0 to 120.  Minimally important difference is 5 units  CAT: CAT can identify the health impairment of COPD patients and is better correlated with disease progression.  CAT has a scoring range  of zero to 40. The CAT score is classified into four groups of low (less than 10), medium (10 - 20), high (21-30) and very high (31-40) based on the impact level of disease on health status. A CAT score over 10 suggests significant symptoms.  A worsening CAT score could be explained by an exacerbation, poor medication adherence, poor inhaler technique, or progression of COPD or comorbid conditions.  CAT MCID is 2 points  mMRC: mMRC (Modified Medical Research Council) Dyspnea Scale is used to assess the degree of baseline functional disability in patients of respiratory disease due to dyspnea. No minimal important difference is established. A decrease in score of 1 point or greater is considered a positive change.   Pulmonary Function Assessment:  Pulmonary Function Assessment - 04/21/20 1544       Breath   Shortness of Breath Yes;Limiting activity             Exercise Target Goals: Exercise Program Goal: Individual exercise prescription set using results from initial 6 min walk test and THRR while considering  patient's activity barriers and safety.   Exercise Prescription Goal: Initial exercise prescription builds to 30-45 minutes a day of aerobic activity, 2-3 days per week.  Home exercise guidelines will be given to patient during program as part of exercise prescription that the participant will acknowledge.  Education: Aerobic Exercise: - Group verbal and visual presentation on the components of exercise prescription. Introduces F.I.T.T principle from ACSM for exercise prescriptions.  Reviews F.I.T.T. principles of aerobic exercise including progression. Written material given at graduation.   Education: Resistance Exercise: - Group verbal and visual presentation on the components of exercise prescription. Introduces F.I.T.T principle from ACSM for exercise prescriptions  Reviews F.I.T.T. principles of resistance exercise including progression. Written material given at  graduation.    Education: Exercise & Equipment Safety: - Individual verbal instruction and demonstration of equipment use and safety with use of the equipment. Flowsheet Row Pulmonary Rehab from 05/12/2020 in [facility] Cardiac and Pulmonary Rehab  Date 05/03/20  Educator AS  Instruction Review Code 1- Verbalizes Understanding       Education: Exercise Physiology & General Exercise Guidelines: - Group verbal and written instruction with models to review the exercise physiology of the cardiovascular system and associated critical values. Provides general exercise guidelines with specific guidelines to those with heart or lung disease.    Education: Flexibility, Balance, Mind/Body Relaxation: - Group verbal and visual presentation with interactive activity on the components of exercise prescription. Introduces F.I.T.T principle from ACSM for exercise prescriptions. Reviews F.I.T.T.  principles of flexibility and balance exercise training including progression. Also discusses the mind body connection.  Reviews various relaxation techniques to help reduce and manage stress (i.e. Deep breathing, progressive muscle relaxation, and visualization). Balance handout provided to take home. Written material given at graduation. Flowsheet Row Pulmonary Rehab from 05/12/2020 in [facility] Cardiac and Pulmonary Rehab  Date 05/12/20  Educator AS  Instruction Review Code 1- Verbalizes Understanding       Activity Barriers & Risk Stratification:   6 Minute Walk:  6 Minute Walk     Row Name 05/03/20 1703         6 Minute Walk   Phase Initial     Distance 300 feet     Walk Time 3 minutes     # of Rest Breaks 3     MPH 1.13     METS 1     RPE 13     Perceived Dyspnea  3     VO2 Peak 1.37     Symptoms Yes (comment)     Comments SOB     Resting HR 80 bpm     Resting BP 136/64     Resting Oxygen Saturation  95 %     Exercise Oxygen Saturation  during 6 min walk 89 %     Max Ex. HR 109 bpm     Max Ex. BP  190/80     2 Minute Post BP 154/86           Interval HR     1 Minute HR 106     2 Minute HR 97     3 Minute HR 105     4 Minute HR 108     5 Minute HR 104     6 Minute HR 109     2 Minute Post HR 97     Interval Heart Rate? Yes           Interval Oxygen     Interval Oxygen? Yes     Baseline Oxygen Saturation % 95 %     1 Minute Oxygen Saturation % 91 %     1 Minute Liters of Oxygen 0 L     2 Minute Oxygen Saturation % 90 %     2 Minute Liters of Oxygen 0 L     3 Minute Oxygen Saturation % 94 %     3 Minute Liters of Oxygen 0 L     4 Minute Oxygen Saturation % 91 %     4 Minute Liters of Oxygen 0 L     5 Minute Oxygen Saturation % 93 %     5 Minute Liters of Oxygen 0 L     6 Minute Oxygen Saturation % 89 %     6 Minute Liters of Oxygen 0 L     2 Minute Post Oxygen Saturation % 93 %     2 Minute Post Liters of Oxygen 0 L            Oxygen Initial Assessment:  Oxygen Initial Assessment - 04/21/20 1543       Home Oxygen   Home Oxygen Device None    Sleep Oxygen Prescription None    Home Exercise Oxygen Prescription None    Home Resting Oxygen Prescription None      Initial 6 min Walk   Oxygen Used None      Program Oxygen Prescription   Program Oxygen Prescription None  Intervention   Short Term Goals To learn and understand importance of monitoring SPO2 with pulse oximeter and demonstrate accurate use of the pulse oximeter.;To learn and understand importance of maintaining oxygen saturations>88%;To learn and demonstrate proper pursed lip breathing techniques or other breathing techniques. ;To learn and demonstrate proper use of respiratory medications    Long  Term Goals Verbalizes importance of monitoring SPO2 with pulse oximeter and return demonstration;Maintenance of O2 saturations>88%;Exhibits proper breathing techniques, such as pursed lip breathing or other method taught during program session;Compliance with respiratory medication;Demonstrates proper  use of MDI's             Oxygen Re-Evaluation:  Oxygen Re-Evaluation     Row Name 05/12/20 1540 06/21/20 1542           Program Oxygen Prescription   Program Oxygen Prescription None None             Home Oxygen      Home Oxygen Device None None      Sleep Oxygen Prescription None None      Home Exercise Oxygen Prescription None None      Home Resting Oxygen Prescription None None             Goals/Expected Outcomes      Short Term Goals To learn and understand importance of monitoring SPO2 with pulse oximeter and demonstrate accurate use of the pulse oximeter.;To learn and understand importance of maintaining oxygen saturations>88%;To learn and demonstrate proper pursed lip breathing techniques or other breathing techniques.  To learn and understand importance of monitoring SPO2 with pulse oximeter and demonstrate accurate use of the pulse oximeter.;To learn and understand importance of maintaining oxygen saturations>88%;To learn and demonstrate proper pursed lip breathing techniques or other breathing techniques.       Long  Term Goals Verbalizes importance of monitoring SPO2 with pulse oximeter and return demonstration;Maintenance of O2 saturations>88%;Exhibits proper breathing techniques, such as pursed lip breathing or other method taught during program session Verbalizes importance of monitoring SPO2 with pulse oximeter and return demonstration;Maintenance of O2 saturations>88%;Exhibits proper breathing techniques, such as pursed lip breathing or other method taught during program session      Comments Reviewed PLB technique with pt.  Talked about how it works and it's importance in maintaining their exercise saturations. She is not sure how much her PLB is helping. She feels her breathing is better with a nebulizer. She feels she is short of breath when she is at rest. She sees her pulmonologist this month.      Goals/Expected Outcomes Short: Become more profiecient at using  PLB.   Long: Become independent at using PLB. Short: Become more profiecient at using PLB. See pulmonologist.  Long: Become independent at using PLB.              Oxygen Discharge (Final Oxygen Re-Evaluation):  Oxygen Re-Evaluation - 06/21/20 1542       Program Oxygen Prescription   Program Oxygen Prescription None      Home Oxygen   Home Oxygen Device None    Sleep Oxygen Prescription None    Home Exercise Oxygen Prescription None    Home Resting Oxygen Prescription None      Goals/Expected Outcomes   Short Term Goals To learn and understand importance of monitoring SPO2 with pulse oximeter and demonstrate accurate use of the pulse oximeter.;To learn and understand importance of maintaining oxygen saturations>88%;To learn and demonstrate proper pursed lip breathing techniques or other breathing techniques.  Long  Term Goals Verbalizes importance of monitoring SPO2 with pulse oximeter and return demonstration;Maintenance of O2 saturations>88%;Exhibits proper breathing techniques, such as pursed lip breathing or other method taught during program session    Comments She is not sure how much her PLB is helping. She feels her breathing is better with a nebulizer. She feels she is short of breath when she is at rest. She sees her pulmonologist this month.    Goals/Expected Outcomes Short: Become more profiecient at using PLB. See pulmonologist.  Long: Become independent at using PLB.             Initial Exercise Prescription:  Initial Exercise Prescription - 05/03/20 1700       Date of Initial Exercise RX and Referring Provider   Date 05/03/20    Referring Provider Aleskerov      Recumbant Bike   Level 1    RPM 60    Minutes 15    METs 1      NuStep   Level 1    SPM 80    Minutes 15    METs 1      Arm Ergometer   Level 1    RPM 25    Minutes 15    METs 1      Track   Laps 7    Minutes 15    METs 1      Prescription Details   Frequency (times per week) 3     Duration Progress to 30 minutes of continuous aerobic without signs/symptoms of physical distress      Intensity   THRR 40-80% of Max Heartrate 99-117    Ratings of Perceived Exertion 11-13      Resistance Training   Training Prescription Yes    Weight 2 lb    Reps 10-15             Perform Capillary Blood Glucose checks as needed.  Exercise Prescription Changes:   Exercise Prescription Changes     Row Name 05/03/20 1700 05/25/20 1400           Response to Exercise   Blood Pressure (Admit) 136/64 146/68      Blood Pressure (Exercise) 190/80 148/68      Blood Pressure (Exit) 154/86 152/66      Heart Rate (Admit) 80 bpm 82 bpm      Heart Rate (Exercise) 109 bpm 90 bpm      Heart Rate (Exit) 97 bpm 86 bpm      Oxygen Saturation (Admit) 95 % 92 %      Oxygen Saturation (Exercise) 89 % 90 %      Oxygen Saturation (Exit) 93 % 93 %      Rating of Perceived Exertion (Exercise) 13 13      Perceived Dyspnea (Exercise) 3 2      Symptoms SOB none      Duration -- Progress to 30 minutes of  aerobic without signs/symptoms of physical distress      Intensity -- THRR unchanged             Progression      Progression -- Continue to progress workloads to maintain intensity without signs/symptoms of physical distress.      Average METs -- 1.65             Resistance Training      Training Prescription -- Yes      Weight -- 2 lb  Reps -- 10-15             NuStep      Level -- 1      SPM -- 80      Minutes -- 15      METs -- 1.65              Exercise Comments:   Exercise Comments     Row Name 05/12/20 1539           Exercise Comments First full day of exercise!  Patient was oriented to gym and equipment including functions, settings, policies, and procedures.  Patient's individual exercise prescription and treatment plan were reviewed.  All starting workloads were established based on the results of the 6 minute walk test done at initial orientation  visit.  The plan for exercise progression was also introduced and progression will be customized based on patient's performance and goals.                Exercise Goals and Review:   Exercise Goals     Row Name 05/03/20 1709             Exercise Goals   Increase Physical Activity Yes       Intervention Provide advice, education, support and counseling about physical activity/exercise needs.;Develop an individualized exercise prescription for aerobic and resistive training based on initial evaluation findings, risk stratification, comorbidities and participant's personal goals.       Expected Outcomes Short Term: Attend rehab on a regular basis to increase amount of physical activity.;Long Term: Add in home exercise to make exercise part of routine and to increase amount of physical activity.;Long Term: Exercising regularly at least 3-5 days a week.       Increase Strength and Stamina Yes       Intervention Provide advice, education, support and counseling about physical activity/exercise needs.;Develop an individualized exercise prescription for aerobic and resistive training based on initial evaluation findings, risk stratification, comorbidities and participant's personal goals.       Expected Outcomes Short Term: Increase workloads from initial exercise prescription for resistance, speed, and METs.;Short Term: Perform resistance training exercises routinely during rehab and add in resistance training at home;Long Term: Improve cardiorespiratory fitness, muscular endurance and strength as measured by increased METs and functional capacity (6MWT)       Able to understand and use rate of perceived exertion (RPE) scale Yes       Intervention Provide education and explanation on how to use RPE scale       Expected Outcomes Short Term: Able to use RPE daily in rehab to express subjective intensity level;Long Term:  Able to use RPE to guide intensity level when exercising independently        Able to understand and use Dyspnea scale Yes       Intervention Provide education and explanation on how to use Dyspnea scale       Expected Outcomes Short Term: Able to use Dyspnea scale daily in rehab to express subjective sense of shortness of breath during exertion;Long Term: Able to use Dyspnea scale to guide intensity level when exercising independently       Knowledge and understanding of Target Heart Rate Range (THRR) Yes       Intervention Provide education and explanation of THRR including how the numbers were predicted and where they are located for reference       Expected Outcomes Short Term: Able to state/look up THRR;Short Term: Able  to use daily as guideline for intensity in rehab;Long Term: Able to use THRR to govern intensity when exercising independently       Able to check pulse independently Yes       Intervention Provide education and demonstration on how to check pulse in carotid and radial arteries.;Review the importance of being able to check your own pulse for safety during independent exercise       Expected Outcomes Short Term: Able to explain why pulse checking is important during independent exercise;Long Term: Able to check pulse independently and accurately       Understanding of Exercise Prescription Yes       Intervention Provide education, explanation, and written materials on patient's individual exercise prescription       Expected Outcomes Short Term: Able to explain program exercise prescription;Long Term: Able to explain home exercise prescription to exercise independently                Exercise Goals Re-Evaluation :  Exercise Goals Re-Evaluation     Row Name 05/12/20 1540 05/25/20 1405 06/10/20 0847 06/21/20 1534       Exercise Goal Re-Evaluation   Exercise Goals Review Increase Physical Activity;Able to understand and use rate of perceived exertion (RPE) scale;Knowledge and understanding of Target Heart Rate Range (THRR);Understanding of Exercise  Prescription;Increase Strength and Stamina;Able to understand and use Dyspnea scale;Able to check pulse independently Increase Physical Activity;Increase Strength and Stamina -- Increase Physical Activity;Increase Strength and Stamina    Comments Reviewed RPE and dyspnea scales, THR and program prescription with pt today.  Pt voiced understanding and was given a copy of goals to take home. [name] [name] does well with the NS.  She has tried the track and got up to 3 laps.  Staff will monitor progress. Out since last review (5/11) [name] [name] has been absent consistently due to iron infusions making her not feel well. She has been to rehab 6 times since starting in April. EP has not gone over home exercise yet for this reason. She is doing some leg exercises at home - she is doing leg lifts at home to keep moving and she will stretch. She also likes be in the garden.    Expected Outcomes Short: Use RPE daily to regulate intensity. Long: Follow program prescription in THR. Short: attend consistently Long:  build overall stamina -- Short: attend consistently, EP to go over home exercise. Long:  build overall stamina             Discharge Exercise Prescription (Final Exercise Prescription Changes):  Exercise Prescription Changes - 05/25/20 1400       Response to Exercise   Blood Pressure (Admit) 146/68    Blood Pressure (Exercise) 148/68    Blood Pressure (Exit) 152/66    Heart Rate (Admit) 82 bpm    Heart Rate (Exercise) 90 bpm    Heart Rate (Exit) 86 bpm    Oxygen Saturation (Admit) 92 %    Oxygen Saturation (Exercise) 90 %    Oxygen Saturation (Exit) 93 %    Rating of Perceived Exertion (Exercise) 13    Perceived Dyspnea (Exercise) 2    Symptoms none    Duration Progress to 30 minutes of  aerobic without signs/symptoms of physical distress    Intensity THRR unchanged      Progression   Progression Continue to progress workloads to maintain intensity without signs/symptoms of physical distress.     Average METs 1.65      Resistance Training  Training Prescription Yes    Weight 2 lb    Reps 10-15      NuStep   Level 1    SPM 80    Minutes 15    METs 1.65             Nutrition:  Target Goals: Understanding of nutrition guidelines, daily intake of sodium 1500mg , cholesterol 200mg , calories 30% from fat and 7% or less from saturated fats, daily to have 5 or more servings of fruits and vegetables.  Education: All About Nutrition: -Group instruction provided by verbal, written material, interactive activities, discussions, models, and posters to present general guidelines for heart healthy nutrition including fat, fiber, MyPlate, the role of sodium in heart healthy nutrition, utilization of the nutrition label, and utilization of this knowledge for meal planning. Follow up email sent as well. Written material given at graduation.   Biometrics:  Pre Biometrics - 05/03/20 1709       Pre Biometrics   Height 5\' 7"  (1.702 m)    Weight 183 lb 14.4 oz (83.4 kg)    BMI (Calculated) 28.8              Nutrition Therapy Plan and Nutrition Goals:  Nutrition Therapy & Goals - 05/17/20 1339       Nutrition Therapy   Diet Heart healthy, low Na    Protein (specify units) 65g    Fiber 25 grams    Whole Grain Foods 3 servings    Saturated Fats 12 max. grams    Fruits and Vegetables 8 servings/day    Sodium 1.5 grams      Personal Nutrition Goals   Nutrition Goal ST: check blood work for iron LT: meet nutritional needs    Comments B: cereal (soy milk) and banana, cinnamon rolls, or eggs with toast. always OJ L: fruit and cottage cheese or yogurt - sometimes tomato sandwich D: vegetables and meat (doctor told her to eat lots of beef). Example: chicken, cooked cabbage, potatoes, butter beans - she ate more than usual. S: candy and ice cream. She reports having a good variety of fruits and vegetables. She uses butter and olive oil. She does not eat salt with her cooking. She  uses white bread and rye bread. Drinks: water, sometimes milk with her dinner. She takes a vitamin C, B12, and vitamin D. Her iron was low, new bloodwork will show if still low. Discussed other iron options such as pumpkin seeds, chickpeas, and lentils. Discussed heart healthy eating.      Intervention Plan   Intervention Prescribe, educate and counsel regarding individualized specific dietary modifications aiming towards targeted core components such as weight, hypertension, lipid management, diabetes, heart failure and other comorbidities.;Nutrition handout(s) given to patient.    Expected Outcomes Short Term Goal: Understand basic principles of dietary content, such as calories, fat, sodium, cholesterol and nutrients.;Short Term Goal: A plan has been developed with personal nutrition goals set during dietitian appointment.;Long Term Goal: Adherence to prescribed nutrition plan.             Nutrition Assessments:  MEDIFICTS Score Key: ?70 Need to make dietary changes  40-70 Heart Healthy Diet ? 40 Therapeutic Level Cholesterol Diet  Flowsheet Row Pulmonary Rehab from 05/03/2020 in [facility] Cardiac and Pulmonary Rehab  Picture Your Plate Total Score on Admission 65      Picture Your Plate Scores: <16 Unhealthy dietary pattern with much room for improvement. 41-50 Dietary pattern unlikely to meet recommendations for good health and  room for improvement. 51-60 More healthful dietary pattern, with some room for improvement.  >60 Healthy dietary pattern, although there may be some specific behaviors that could be improved.   Nutrition Goals Re-Evaluation:  Nutrition Goals Re-Evaluation     [name] [name] Name 06/21/20 1605             Goals   Nutrition Goal ST: continue to include iron rich foods (meat, chickpeas, lentils) LT: meet nutritional needs       Comment She reports eating well and she is just concerned for her iron, she eats beets and meat to help. Discussed other iron rich foods and  pairing with vitamin C like lemon juice to help absorption. Her MD said it doesn't matter what she eats, her stomach will still hurts.       Expected Outcome ST: continue to include iron rich foods (meat, chickpeas, lentils) LT: meet nutritional needs                Nutrition Goals Discharge (Final Nutrition Goals Re-Evaluation):  Nutrition Goals Re-Evaluation - 06/21/20 1605       Goals   Nutrition Goal ST: continue to include iron rich foods (meat, chickpeas, lentils) LT: meet nutritional needs    Comment She reports eating well and she is just concerned for her iron, she eats beets and meat to help. Discussed other iron rich foods and pairing with vitamin C like lemon juice to help absorption. Her MD said it doesn't matter what she eats, her stomach will still hurts.    Expected Outcome ST: continue to include iron rich foods (meat, chickpeas, lentils) LT: meet nutritional needs             Psychosocial: Target Goals: Acknowledge presence or absence of significant depression and/or stress, maximize coping skills, provide positive support system. Participant is able to verbalize types and ability to use techniques and skills needed for reducing stress and depression.   Education: Stress, Anxiety, and Depression - Group verbal and visual presentation to define topics covered.  Reviews how body is impacted by stress, anxiety, and depression.  Also discusses healthy ways to reduce stress and to treat/manage anxiety and depression.  Written material given at graduation.   Education: Sleep Hygiene -Provides group verbal and written instruction about how sleep can affect your health.  Define sleep hygiene, discuss sleep cycles and impact of sleep habits. Review good sleep hygiene tips.    Initial Review & Psychosocial Screening:  Initial Psych Review & Screening - 04/21/20 1545       Initial Review   Current issues with Current Psychotropic Meds;Current Anxiety/Panic      Family  Dynamics   Good Support System? Yes    Comments [name] [name] has general anxiety since she lost her husband and daughter. She lives alone and the pandemic has not helped with her anxiety. She can look to God and Son in-law for support. She has great neighbors and two really good friends at [facility].      Barriers   Psychosocial barriers to participate in program The patient should benefit from training in stress management and relaxation.      Screening Interventions   Interventions Encouraged to exercise;To provide support and resources with identified psychosocial needs;Provide feedback about the scores to participant    Expected Outcomes Short Term goal: Utilizing psychosocial counselor, staff and physician to assist with identification of specific Stressors or current issues interfering with healing process. Setting desired goal for each stressor or current  issue identified.;Long Term Goal: Stressors or current issues are controlled or eliminated.;Short Term goal: Identification and review with participant of any Quality of Life or Depression concerns found by scoring the questionnaire.;Long Term goal: The participant improves quality of Life and PHQ9 Scores as seen by post scores and/or verbalization of changes             Quality of Life Scores:  Scores of 19 and below usually indicate a poorer quality of life in these areas.  A difference of  2-3 points is a clinically meaningful difference.  A difference of 2-3 points in the total score of the Quality of Life Index has been associated with significant improvement in overall quality of life, self-image, physical symptoms, and general health in studies assessing change in quality of life.  PHQ-9: Recent Review Flowsheet Data     Depression screen [facility] 2/9 06/14/2020 05/03/2020   Decreased Interest 2 1   Down, Depressed, Hopeless 0 0   PHQ - 2 Score 2 1   Altered sleeping 0 0   Tired, decreased energy 3 3   Change in appetite 1 0   Feeling  bad or failure about yourself  0 0   Trouble concentrating 0 3   Moving slowly or fidgety/restless 1 0   Suicidal thoughts 0 0   PHQ-9 Score 7 7   Difficult doing work/chores Somewhat difficult -      Interpretation of Total Score  Total Score Depression Severity:  1-4 = Minimal depression, 5-9 = Mild depression, 10-14 = Moderate depression, 15-19 = Moderately severe depression, 20-27 = Severe depression   Psychosocial Evaluation and Intervention:  Psychosocial Evaluation - 04/21/20 1548       Psychosocial Evaluation & Interventions   Interventions Encouraged to exercise with the program and follow exercise prescription;Relaxation education;Stress management education    Comments [name] [name] has general anxiety since she lost her husband and daughter. She lives alone and the pandemic has not helped with her anxiety. She can look to God and Son in-law for support. She has great neighbors and two really good friends at [facility].    Expected Outcomes Short: Exercise regularly to support mental health and notify staff of any changes. Long: maintain mental health and well being through teaching of rehab or prescribed medications independently.    Continue Psychosocial Services  Follow up required by staff             Psychosocial Re-Evaluation:  Psychosocial Re-Evaluation     [place] Name 06/21/20 1548             Psychosocial Re-Evaluation   Current issues with Current Depression;Current Sleep Concerns       Comments She reports a lot of loss recently - she lost two sisters in the last year and she lost her daughter 2 years ago. She reports feeling difficult to deal with as she feels she is alone - as everyone she used to know is passed. She relies on her caretaker for the most part. She is on medication and she feels like it help. She reports not sleeping well - her arthritis hurts her badly and it bothers her sleep.       Expected Outcomes ST: continue to come to rehab LT: feel like  she is not alone and increases positivity       Interventions Encouraged to attend Pulmonary Rehabilitation for the exercise       Continue Psychosocial Services  Follow up required by staff  Psychosocial Discharge (Final Psychosocial Re-Evaluation):  Psychosocial Re-Evaluation - 06/21/20 1548       Psychosocial Re-Evaluation   Current issues with Current Depression;Current Sleep Concerns    Comments She reports a lot of loss recently - she lost two sisters in the last year and she lost her daughter 2 years ago. She reports feeling difficult to deal with as she feels she is alone - as everyone she used to know is passed. She relies on her caretaker for the most part. She is on medication and she feels like it help. She reports not sleeping well - her arthritis hurts her badly and it bothers her sleep.    Expected Outcomes ST: continue to come to rehab LT: feel like she is not alone and increases positivity    Interventions Encouraged to attend Pulmonary Rehabilitation for the exercise    Continue Psychosocial Services  Follow up required by staff             Education: Education Goals: Education classes will be provided on a weekly basis, covering required topics. Participant will state understanding/return demonstration of topics presented.  Learning Barriers/Preferences:  Learning Barriers/Preferences - 04/21/20 1544       Learning Barriers/Preferences   Learning Barriers None    Learning Preferences None             General Pulmonary Education Topics:  Infection Prevention: - Provides verbal and written material to individual with discussion of infection control including proper hand washing and proper equipment cleaning during exercise session. Flowsheet Row Pulmonary Rehab from 05/12/2020 in [facility] Cardiac and Pulmonary Rehab  Date 05/03/20  Educator AS  Instruction Review Code 1- Verbalizes Understanding       Falls Prevention: - Provides  verbal and written material to individual with discussion of falls prevention and safety. Flowsheet Row Pulmonary Rehab from 05/12/2020 in [facility] Cardiac and Pulmonary Rehab  Date 05/03/20  Educator AS  Instruction Review Code 1- Verbalizes Understanding       Chronic Lung Disease Review: - Group verbal instruction with posters, models, PowerPoint presentations and videos,  to review new updates, new respiratory medications, new advancements in procedures and treatments. Providing information on websites and "800" numbers for continued self-education. Includes information about supplement oxygen, available portable oxygen systems, continuous and intermittent flow rates, oxygen safety, concentrators, and Medicare reimbursement for oxygen. Explanation of Pulmonary Drugs, including class, frequency, complications, importance of spacers, rinsing mouth after steroid MDI's, and proper cleaning methods for nebulizers. Review of basic lung anatomy and physiology related to function, structure, and complications of lung disease. Review of risk factors. Discussion about methods for diagnosing sleep apnea and types of masks and machines for OSA. Includes a review of the use of types of environmental controls: home humidity, furnaces, filters, dust mite/pet prevention, HEPA vacuums. Discussion about weather changes, air quality and the benefits of nasal washing. Instruction on Warning signs, infection symptoms, calling MD promptly, preventive modes, and value of vaccinations. Review of effective airway clearance, coughing and/or vibration techniques. Emphasizing that all should Create an Action Plan. Written material given at graduation.   AED/CPR: - Group verbal and written instruction with the use of models to demonstrate the basic use of the AED with the basic ABC's of resuscitation.    Anatomy and Cardiac Procedures: - Group verbal and visual presentation and models provide information about basic cardiac  anatomy and function. Reviews the testing methods done to diagnose heart disease and the outcomes of the test results. Describes  the treatment choices: Medical Management, Angioplasty, or Coronary Bypass Surgery for treating various heart conditions including Myocardial Infarction, Angina, Valve Disease, and Cardiac Arrhythmias.  Written material given at graduation.   Medication Safety: - Group verbal and visual instruction to review commonly prescribed medications for heart and lung disease. Reviews the medication, class of the drug, and side effects. Includes the steps to properly store meds and maintain the prescription regimen.  Written material given at graduation.   Other: -Provides group and verbal instruction on various topics (see comments)   Knowledge Questionnaire Score:  Knowledge Questionnaire Score - 05/03/20 1710       Knowledge Questionnaire Score   Pre Score 16/18              Core Components/Risk Factors/Patient Goals at Admission:  Personal Goals and Risk Factors at Admission - 05/03/20 1712       Core Components/Risk Factors/Patient Goals on Admission    Weight Management Yes;Weight Loss    Intervention Weight Management: Develop a combined nutrition and exercise program designed to reach desired caloric intake, while maintaining appropriate intake of nutrient and fiber, sodium and fats, and appropriate energy expenditure required for the weight goal.;Weight Management: Provide education and appropriate resources to help participant work on and attain dietary goals.;Weight Management/Obesity: Establish reasonable short term and long term weight goals.    Expected Outcomes Short Term: Continue to assess and modify interventions until short term weight is achieved;Long Term: Adherence to nutrition and physical activity/exercise program aimed toward attainment of established weight goal;Weight Loss: Understanding of general recommendations for a balanced deficit meal  plan, which promotes 1-2 lb weight loss per week and includes a negative energy balance of [phone] kcal/d;Understanding recommendations for meals to include 15-35% energy as protein, 25-35% energy from fat, 35-60% energy from carbohydrates, less than 200mg  of dietary cholesterol, 20-35 gm of total fiber daily;Understanding of distribution of calorie intake throughout the day with the consumption of 4-5 meals/snacks    Improve shortness of breath with ADL's Yes    Intervention Provide education, individualized exercise plan and daily activity instruction to help decrease symptoms of SOB with activities of daily living.    Expected Outcomes Short Term: Improve cardiorespiratory fitness to achieve a reduction of symptoms when performing ADLs;Long Term: Be able to perform more ADLs without symptoms or delay the onset of symptoms    Hypertension Yes    Intervention Provide education on lifestyle modifcations including regular physical activity/exercise, weight management, moderate sodium restriction and increased consumption of fresh fruit, vegetables, and low fat dairy, alcohol moderation, and smoking cessation.;Monitor prescription use compliance.    Expected Outcomes Short Term: Continued assessment and intervention until BP is < 140/36mm HG in hypertensive participants. < 130/4mm HG in hypertensive participants with diabetes, heart failure or chronic kidney disease.;Long Term: Maintenance of blood pressure at goal levels.    Lipids Yes    Intervention Provide education and support for participant on nutrition & aerobic/resistive exercise along with prescribed medications to achieve LDL 70mg , HDL >40mg .    Expected Outcomes Short Term: Participant states understanding of desired cholesterol values and is compliant with medications prescribed. Participant is following exercise prescription and nutrition guidelines.;Long Term: Cholesterol controlled with medications as prescribed, with individualized  exercise RX and with personalized nutrition plan. Value goals: LDL < 70mg , HDL > 40 mg.             Education:Diabetes - Individual verbal and written instruction to review signs/symptoms of diabetes, desired ranges of glucose  level fasting, after meals and with exercise. Acknowledge that pre and post exercise glucose checks will be done for 3 sessions at entry of program.   Know Your Numbers and Heart Failure: - Group verbal and visual instruction to discuss disease risk factors for cardiac and pulmonary disease and treatment options.  Reviews associated critical values for Overweight/Obesity, Hypertension, Cholesterol, and Diabetes.  Discusses basics of heart failure: signs/symptoms and treatments.  Introduces Heart Failure Zone chart for action plan for heart failure.  Written material given at graduation.   Core Components/Risk Factors/Patient Goals Review:   Goals and Risk Factor Review     Row Name 06/21/20 1532             Core Components/Risk Factors/Patient Goals Review   Personal Goals Review Improve shortness of breath with ADL's;Hypertension;Develop more efficient breathing techniques such as purse lipped breathing and diaphragmatic breathing and practicing self-pacing with activity.       Review [name] [name] has been absent consistently due to iron infusions making her not feel well. She has been to rehab 6 times since starting in April. She feels that she is short of breath at rest too. She continues to practice PLB. She continues to try and stay active at home and attend rehab. BP today 138/74 - she does not have a BP cuff at home - encouraged to get cuff - she is not interested.       Expected Outcomes ST: attend rehab consistently LT: monitor risk factors                Core Components/Risk Factors/Patient Goals at Discharge (Final Review):   Goals and Risk Factor Review - 06/21/20 1532       Core Components/Risk Factors/Patient Goals Review   Personal Goals Review  Improve shortness of breath with ADL's;Hypertension;Develop more efficient breathing techniques such as purse lipped breathing and diaphragmatic breathing and practicing self-pacing with activity.    Review [name] [name] has been absent consistently due to iron infusions making her not feel well. She has been to rehab 6 times since starting in April. She feels that she is short of breath at rest too. She continues to practice PLB. She continues to try and stay active at home and attend rehab. BP today 138/74 - she does not have a BP cuff at home - encouraged to get cuff - she is not interested.    Expected Outcomes ST: attend rehab consistently LT: monitor risk factors             ITP Comments:  ITP Comments     Row Name 04/21/20 1551 05/03/20 1716 05/12/20 1539 05/17/20 1418 05/26/20 0754   ITP Comments Virtual Visit completed. Patient informed on EP and RD appointment and 6 Minute walk test. Patient also informed of patient health questionnaires on My Chart. Patient Verbalizes understanding. Visit diagnosis can be found in [facility] 04/05/2020. Completed 6MWT and gym orientation. Initial ITP created and sent for review to Dr. Emily Filbert, Medical Director. First full day of exercise!  Patient was oriented to gym and equipment including functions, settings, policies, and procedures.  Patient's individual exercise prescription and treatment plan were reviewed.  All starting workloads were established based on the results of the 6 minute walk test done at initial orientation visit.  The plan for exercise progression was also introduced and progression will be customized based on patient's performance and goals. Completed initial RD Evaluation 30 Day review completed. Medical Director ITP review done, changes made as directed,  and signed approval by Medical Director.    [place] Name 06/10/20 [phone] 06/23/20 0903         ITP Comments Called to check on patient.  She has another infusion today and really lacking energy.   She is aiming to come in on Monday. Out since 05/19/20. 30 Day review completed. Medical Director ITP review done, changes made as directed, and signed approval by Medical Director.               Comments:

## 2020-07-01 ENCOUNTER — Inpatient Hospital Stay: Payer: Medicare Other

## 2020-07-01 ENCOUNTER — Telehealth: Payer: Self-pay

## 2020-07-01 DIAGNOSIS — D509 Iron deficiency anemia, unspecified: Secondary | ICD-10-CM

## 2020-07-01 LAB — CBC WITH DIFFERENTIAL/PLATELET
Abs Immature Granulocytes: 0.03 10*3/uL (ref 0.00–0.07)
Basophils Absolute: 0.2 10*3/uL — ABNORMAL HIGH (ref 0.0–0.1)
Basophils Relative: 2 %
Eosinophils Absolute: 0.4 10*3/uL (ref 0.0–0.5)
Eosinophils Relative: 4 %
HCT: 32.1 % — ABNORMAL LOW (ref 36.0–46.0)
Hemoglobin: 10.2 g/dL — ABNORMAL LOW (ref 12.0–15.0)
Immature Granulocytes: 0 %
Lymphocytes Relative: 12 %
Lymphs Abs: 1 10*3/uL (ref 0.7–4.0)
MCH: 25.2 pg — ABNORMAL LOW (ref 26.0–34.0)
MCHC: 31.8 g/dL (ref 30.0–36.0)
MCV: 79.5 fL — ABNORMAL LOW (ref 80.0–100.0)
Monocytes Absolute: 1.2 10*3/uL — ABNORMAL HIGH (ref 0.1–1.0)
Monocytes Relative: 13 %
Neutro Abs: 6.1 10*3/uL (ref 1.7–7.7)
Neutrophils Relative %: 69 %
Platelets: 401 10*3/uL — ABNORMAL HIGH (ref 150–400)
RBC: 4.04 MIL/uL (ref 3.87–5.11)
RDW: 21.5 % — ABNORMAL HIGH (ref 11.5–15.5)
WBC: 8.8 10*3/uL (ref 4.0–10.5)
nRBC: 0 % (ref 0.0–0.2)

## 2020-07-01 NOTE — Telephone Encounter (Signed)
Has appt Monday with pulmonary Dr - asked her to let us know if she needs to switch times

## 2020-07-05 ENCOUNTER — Encounter: Payer: Self-pay | Admitting: *Deleted

## 2020-07-05 DIAGNOSIS — J449 Chronic obstructive pulmonary disease, unspecified: Secondary | ICD-10-CM

## 2020-07-07 DIAGNOSIS — J449 Chronic obstructive pulmonary disease, unspecified: Secondary | ICD-10-CM

## 2020-07-07 NOTE — Progress Notes (Signed)
Discharge Progress Report  Patient Details  Name: [name] [name] MRN: [id number] Date of Birth: 06-13-1925 Referring Provider:   Flowsheet Row Pulmonary Rehab from 05/03/2020 in [facility]  Referring Provider Aleskerov        Number of Visits: 7  Reason for Discharge:  Early Exit:  Personal  Smoking History:  Social History   Tobacco Use  Smoking Status Former   Packs/day: 2.00   Years: 20.00   Pack years: 40.00   Types: Cigarettes   Quit date: 04/22/1979   Years since quitting: 41.2  Smokeless Tobacco Never    Diagnosis:  Chronic obstructive pulmonary disease, unspecified COPD type ([place])  ADL [facility]:  Pulmonary Assessment Scores     Row Name 05/03/20 1711         ADL [facility]   SOB Score total 70     Rest 0     Walk 2     Stairs 5     Bath 0     Dress 3     Shop 5           CAT Score     CAT Score 20             Initial Exercise Prescription:  Initial Exercise Prescription - 05/03/20 1700       Date of Initial Exercise RX and Referring Provider   Date 05/03/20    Referring Provider Aleskerov      Recumbant Bike   Level 1    RPM 60    Minutes 15    METs 1      NuStep   Level 1    SPM 80    Minutes 15    METs 1      Arm Ergometer   Level 1    RPM 25    Minutes 15    METs 1      Track   Laps 7    Minutes 15    METs 1      Prescription Details   Frequency (times per week) 3    Duration Progress to 30 minutes of continuous aerobic without signs/symptoms of physical distress      Intensity   THRR 40-80% of Max Heartrate 99-117    Ratings of Perceived Exertion 11-13      Resistance Training   Training Prescription Yes    Weight 2 lb    Reps 10-15             Discharge Exercise Prescription (Final Exercise Prescription Changes):  Exercise Prescription Changes - 06/23/20 1200       Response to Exercise   Blood Pressure (Admit) 134/78    Blood Pressure (Exercise) 172/74    Blood Pressure  (Exit) 144/74    Heart Rate (Admit) 83 bpm    Heart Rate (Exercise) 81 bpm    Heart Rate (Exit) 94 bpm    Oxygen Saturation (Admit) 90 %    Oxygen Saturation (Exercise) 94 %    Oxygen Saturation (Exit) 93 %    Rating of Perceived Exertion (Exercise) 95    Perceived Dyspnea (Exercise) 2    Symptoms none    Duration Continue with 30 min of aerobic exercise without signs/symptoms of physical distress.    Intensity THRR unchanged      Progression   Progression Continue to progress workloads to maintain intensity without signs/symptoms of physical distress.    Average METs 1.4      Resistance  Training   Training Prescription Yes    Weight 2 lb    Reps 10-15             Functional Capacity:  6 Minute Walk     Row Name 05/03/20 1703         6 Minute Walk   Phase Initial     Distance 300 feet     Walk Time 3 minutes     # of Rest Breaks 3     MPH 1.13     METS 1     RPE 13     Perceived Dyspnea  3     VO2 Peak 1.37     Symptoms Yes (comment)     Comments SOB     Resting HR 80 bpm     Resting BP 136/64     Resting Oxygen Saturation  95 %     Exercise Oxygen Saturation  during 6 min walk 89 %     Max Ex. HR 109 bpm     Max Ex. BP 190/80     2 Minute Post BP 154/86           Interval HR     1 Minute HR 106     2 Minute HR 97     3 Minute HR 105     4 Minute HR 108     5 Minute HR 104     6 Minute HR 109     2 Minute Post HR 97     Interval Heart Rate? Yes           Interval Oxygen     Interval Oxygen? Yes     Baseline Oxygen Saturation % 95 %     1 Minute Oxygen Saturation % 91 %     1 Minute Liters of Oxygen 0 L     2 Minute Oxygen Saturation % 90 %     2 Minute Liters of Oxygen 0 L     3 Minute Oxygen Saturation % 94 %     3 Minute Liters of Oxygen 0 L     4 Minute Oxygen Saturation % 91 %     4 Minute Liters of Oxygen 0 L     5 Minute Oxygen Saturation % 93 %     5 Minute Liters of Oxygen 0 L     6 Minute Oxygen Saturation % 89 %     6 Minute  Liters of Oxygen 0 L     2 Minute Post Oxygen Saturation % 93 %     2 Minute Post Liters of Oxygen 0 L             Psychological, QOL, Others - Outcomes: PHQ 2/9: Depression screen [facility] 2/9 06/14/2020 05/03/2020  Decreased Interest 2 1  Down, Depressed, Hopeless 0 0  PHQ - 2 Score 2 1  Altered sleeping 0 0  Tired, decreased energy 3 3  Change in appetite 1 0  Feeling bad or failure about yourself  0 0  Trouble concentrating 0 3  Moving slowly or fidgety/restless 1 0  Suicidal thoughts 0 0  PHQ-9 Score 7 7  Difficult doing work/chores Somewhat difficult -    Quality of Life:   Nutrition & Weight - Outcomes:  Pre Biometrics - 05/03/20 1709       Pre Biometrics   Height 5\' 7"  (1.702 m)    Weight 183 lb 14.4 oz (83.4 kg)  BMI (Calculated) 28.8              Nutrition:  Nutrition Therapy & Goals - 05/17/20 1339       Nutrition Therapy   Diet Heart healthy, low Na    Protein (specify units) 65g    Fiber 25 grams    Whole Grain Foods 3 servings    Saturated Fats 12 max. grams    Fruits and Vegetables 8 servings/day    Sodium 1.5 grams      Personal Nutrition Goals   Nutrition Goal ST: check blood work for iron LT: meet nutritional needs    Comments B: cereal (soy milk) and banana, cinnamon rolls, or eggs with toast. always OJ L: fruit and cottage cheese or yogurt - sometimes tomato sandwich D: vegetables and meat (doctor told her to eat lots of beef). Example: chicken, cooked cabbage, potatoes, butter beans - she ate more than usual. S: candy and ice cream. She reports having a good variety of fruits and vegetables. She uses butter and olive oil. She does not eat salt with her cooking. She uses white bread and rye bread. Drinks: water, sometimes milk with her dinner. She takes a vitamin C, B12, and vitamin D. Her iron was low, new bloodwork will show if still low. Discussed other iron options such as pumpkin seeds, chickpeas, and lentils. Discussed heart healthy  eating.      Intervention Plan   Intervention Prescribe, educate and counsel regarding individualized specific dietary modifications aiming towards targeted core components such as weight, hypertension, lipid management, diabetes, heart failure and other comorbidities.;Nutrition handout(s) given to patient.    Expected Outcomes Short Term Goal: Understand basic principles of dietary content, such as calories, fat, sodium, cholesterol and nutrients.;Short Term Goal: A plan has been developed with personal nutrition goals set during dietitian appointment.;Long Term Goal: Adherence to prescribed nutrition plan.             Nutrition Discharge:   Education Questionnaire Score:  Knowledge Questionnaire Score - 05/03/20 1710       Knowledge Questionnaire Score   Pre Score 16/18             Goals reviewed with patient; copy given to patient.

## 2020-07-07 NOTE — Progress Notes (Signed)
Pulmonary Individual Treatment Plan  Patient Details  Name: Glora Hulgan MRN: [id number] Date of Birth: 03-08-1925 Referring Provider:   Flowsheet Row Pulmonary Rehab from 05/03/2020 in [facility] Cardiac and Pulmonary Rehab  Referring Provider Aleskerov       Initial Encounter Date:  Flowsheet Row Pulmonary Rehab from 05/03/2020 in [facility] Cardiac and Pulmonary Rehab  Date 05/03/20       Visit Diagnosis: Chronic obstructive pulmonary disease, unspecified COPD type ([place])  Patient's Home Medications on Admission:  Current Outpatient Medications:    acetaminophen (TYLENOL) 500 MG tablet, Take 500-1,000 mg by mouth every 6 (six) hours as needed for mild pain, moderate pain or fever., Disp: , Rfl:    acidophilus (RISAQUAD) CAPS capsule, Take 1 capsule by mouth daily. (Patient taking differently: Take 1 capsule by mouth in the morning and at bedtime.), Disp: 14 capsule, Rfl: 0   chlorthalidone (HYGROTON) 25 MG tablet, Take 0.5 tablets (12.5 mg total) by mouth daily. May resume on 11/1 if diarrhea resolves, Disp: , Rfl:    Cyanocobalamin (B-12) 1000 MCG CAPS, Take 2,000 Units by mouth daily. , Disp: , Rfl:    cycloSPORINE (RESTASIS) 0.05 % ophthalmic emulsion, Place 1 drop into both eyes 2 (two) times daily., Disp: , Rfl:    escitalopram (LEXAPRO) 20 MG tablet, Take 20 mg by mouth daily., Disp: , Rfl:    gabapentin (NEURONTIN) 300 MG capsule, Take 300 mg by mouth 2 (two) times daily., Disp: , Rfl:    hydrochlorothiazide (HYDRODIURIL) 12.5 MG tablet, Take 12.5 mg by mouth daily., Disp: , Rfl:    ipratropium-albuterol (DUONEB) 0.5-2.5 (3) MG/3ML SOLN, Inhale into the lungs., Disp: , Rfl:    losartan (COZAAR) 50 MG tablet, Take 50 mg by mouth daily., Disp: , Rfl:    mometasone (ELOCON) 0.1 % lotion, Apply 1 application topically daily as needed (skin irritation)., Disp: , Rfl:    Multiple Vitamins-Minerals (OCUVITE PRESERVISION PO), Take 1 tablet by mouth 2 (two) times daily., Disp: , Rfl:     potassium chloride (KLOR-CON) 10 MEQ tablet, Take 10 mEq by mouth 3 (three) times daily., Disp: , Rfl:    RABEprazole (ACIPHEX) 20 MG tablet, Take 20 mg by mouth 2 (two) times daily., Disp: , Rfl:  No current facility-administered medications for this visit.  Facility-Administered Medications Ordered in Other Visits:    0.9 %  sodium chloride infusion, , Intravenous, Continuous, Sindy Guadeloupe, MD, Last Rate: 0 mL/hr at 10/08/17 1518, New Bag at 06/21/18 4765  Past Medical History: Past Medical History:  Diagnosis Date   Actinic keratosis    Anemia    COPD (chronic obstructive pulmonary disease) (HCC)    Diverticulitis    GERD (gastroesophageal reflux disease)    Glaucoma    HLD (hyperlipidemia)    Hypertension    Iron deficiency    Rectal bleeding    Skin cancer    Nose, txted by Dr. Valere Dross at [facility]    Tobacco Use: Social History   Tobacco Use  Smoking Status Former   Packs/day: 2.00   Years: 20.00   Pack years: 40.00   Types: Cigarettes   Quit date: 04/22/1979   Years since quitting: 41.2  Smokeless Tobacco Never    Labs: Recent Review Flowsheet Data   There is no flowsheet data to display.      Pulmonary Assessment Scores:  Pulmonary Assessment Scores     Row Name 05/03/20 1711         ADL UCSD  SOB Score total 70     Rest 0     Walk 2     Stairs 5     Bath 0     Dress 3     Shop 5           CAT Score     CAT Score 20             UCSD: Self-administered rating of dyspnea associated with activities of daily living (ADLs) 6-point scale (0 = "not at all" to 5 = "maximal or unable to do because of breathlessness")  Scoring Scores range from 0 to 120.  Minimally important difference is 5 units  CAT: CAT can identify the health impairment of COPD patients and is better correlated with disease progression.  CAT has a scoring range of zero to 40. The CAT score is classified into four groups of low (less than 10), medium (10 - 20), high (21-30) and  very high (31-40) based on the impact level of disease on health status. A CAT score over 10 suggests significant symptoms.  A worsening CAT score could be explained by an exacerbation, poor medication adherence, poor inhaler technique, or progression of COPD or comorbid conditions.  CAT MCID is 2 points  mMRC: mMRC (Modified Medical Research Council) Dyspnea Scale is used to assess the degree of baseline functional disability in patients of respiratory disease due to dyspnea. No minimal important difference is established. A decrease in score of 1 point or greater is considered a positive change.   Pulmonary Function Assessment:  Pulmonary Function Assessment - 04/21/20 1544       Breath   Shortness of Breath Yes;Limiting activity             Exercise Target Goals: Exercise Program Goal: Individual exercise prescription set using results from initial 6 min walk test and THRR while considering  patient's activity barriers and safety.   Exercise Prescription Goal: Initial exercise prescription builds to 30-45 minutes a day of aerobic activity, 2-3 days per week.  Home exercise guidelines will be given to patient during program as part of exercise prescription that the participant will acknowledge.  Education: Aerobic Exercise: - Group verbal and visual presentation on the components of exercise prescription. Introduces F.I.T.T principle from ACSM for exercise prescriptions.  Reviews F.I.T.T. principles of aerobic exercise including progression. Written material given at graduation.   Education: Resistance Exercise: - Group verbal and visual presentation on the components of exercise prescription. Introduces F.I.T.T principle from ACSM for exercise prescriptions  Reviews F.I.T.T. principles of resistance exercise including progression. Written material given at graduation.    Education: Exercise & Equipment Safety: - Individual verbal instruction and demonstration of equipment use  and safety with use of the equipment. Flowsheet Row Pulmonary Rehab from 05/12/2020 in [facility] Cardiac and Pulmonary Rehab  Date 05/03/20  Educator AS  Instruction Review Code 1- Verbalizes Understanding       Education: Exercise Physiology & General Exercise Guidelines: - Group verbal and written instruction with models to review the exercise physiology of the cardiovascular system and associated critical values. Provides general exercise guidelines with specific guidelines to those with heart or lung disease.    Education: Flexibility, Balance, Mind/Body Relaxation: - Group verbal and visual presentation with interactive activity on the components of exercise prescription. Introduces F.I.T.T principle from ACSM for exercise prescriptions. Reviews F.I.T.T. principles of flexibility and balance exercise training including progression. Also discusses the mind body connection.  Reviews various relaxation techniques to  help reduce and manage stress (i.e. Deep breathing, progressive muscle relaxation, and visualization). Balance handout provided to take home. Written material given at graduation. Flowsheet Row Pulmonary Rehab from 05/12/2020 in [facility] Cardiac and Pulmonary Rehab  Date 05/12/20  Educator AS  Instruction Review Code 1- Verbalizes Understanding       Activity Barriers & Risk Stratification:   6 Minute Walk:  6 Minute Walk     Row Name 05/03/20 1703         6 Minute Walk   Phase Initial     Distance 300 feet     Walk Time 3 minutes     # of Rest Breaks 3     MPH 1.13     METS 1     RPE 13     Perceived Dyspnea  3     VO2 Peak 1.37     Symptoms Yes (comment)     Comments SOB     Resting HR 80 bpm     Resting BP 136/64     Resting Oxygen Saturation  95 %     Exercise Oxygen Saturation  during 6 min walk 89 %     Max Ex. HR 109 bpm     Max Ex. BP 190/80     2 Minute Post BP 154/86           Interval HR     1 Minute HR 106     2 Minute HR 97     3 Minute HR 105      4 Minute HR 108     5 Minute HR 104     6 Minute HR 109     2 Minute Post HR 97     Interval Heart Rate? Yes           Interval Oxygen     Interval Oxygen? Yes     Baseline Oxygen Saturation % 95 %     1 Minute Oxygen Saturation % 91 %     1 Minute Liters of Oxygen 0 L     2 Minute Oxygen Saturation % 90 %     2 Minute Liters of Oxygen 0 L     3 Minute Oxygen Saturation % 94 %     3 Minute Liters of Oxygen 0 L     4 Minute Oxygen Saturation % 91 %     4 Minute Liters of Oxygen 0 L     5 Minute Oxygen Saturation % 93 %     5 Minute Liters of Oxygen 0 L     6 Minute Oxygen Saturation % 89 %     6 Minute Liters of Oxygen 0 L     2 Minute Post Oxygen Saturation % 93 %     2 Minute Post Liters of Oxygen 0 L            Oxygen Initial Assessment:  Oxygen Initial Assessment - 04/21/20 1543       Home Oxygen   Home Oxygen Device None    Sleep Oxygen Prescription None    Home Exercise Oxygen Prescription None    Home Resting Oxygen Prescription None      Initial 6 min Walk   Oxygen Used None      Program Oxygen Prescription   Program Oxygen Prescription None      Intervention   Short Term Goals To learn and understand importance of monitoring SPO2 with pulse oximeter and demonstrate  accurate use of the pulse oximeter.;To learn and understand importance of maintaining oxygen saturations>88%;To learn and demonstrate proper pursed lip breathing techniques or other breathing techniques. ;To learn and demonstrate proper use of respiratory medications    Long  Term Goals Verbalizes importance of monitoring SPO2 with pulse oximeter and return demonstration;Maintenance of O2 saturations>88%;Exhibits proper breathing techniques, such as pursed lip breathing or other method taught during program session;Compliance with respiratory medication;Demonstrates proper use of MDI's             Oxygen Re-Evaluation:  Oxygen Re-Evaluation     Row Name 05/12/20 1540 06/21/20 1542            Program Oxygen Prescription   Program Oxygen Prescription None None             Home Oxygen      Home Oxygen Device None None      Sleep Oxygen Prescription None None      Home Exercise Oxygen Prescription None None      Home Resting Oxygen Prescription None None             Goals/Expected Outcomes      Short Term Goals To learn and understand importance of monitoring SPO2 with pulse oximeter and demonstrate accurate use of the pulse oximeter.;To learn and understand importance of maintaining oxygen saturations>88%;To learn and demonstrate proper pursed lip breathing techniques or other breathing techniques.  To learn and understand importance of monitoring SPO2 with pulse oximeter and demonstrate accurate use of the pulse oximeter.;To learn and understand importance of maintaining oxygen saturations>88%;To learn and demonstrate proper pursed lip breathing techniques or other breathing techniques.       Long  Term Goals Verbalizes importance of monitoring SPO2 with pulse oximeter and return demonstration;Maintenance of O2 saturations>88%;Exhibits proper breathing techniques, such as pursed lip breathing or other method taught during program session Verbalizes importance of monitoring SPO2 with pulse oximeter and return demonstration;Maintenance of O2 saturations>88%;Exhibits proper breathing techniques, such as pursed lip breathing or other method taught during program session      Comments Reviewed PLB technique with pt.  Talked about how it works and it's importance in maintaining their exercise saturations. She is not sure how much her PLB is helping. She feels her breathing is better with a nebulizer. She feels she is short of breath when she is at rest. She sees her pulmonologist this month.      Goals/Expected Outcomes Short: Become more profiecient at using PLB.   Long: Become independent at using PLB. Short: Become more profiecient at using PLB. See pulmonologist.  Long: Become  independent at using PLB.              Oxygen Discharge (Final Oxygen Re-Evaluation):  Oxygen Re-Evaluation - 06/21/20 1542       Program Oxygen Prescription   Program Oxygen Prescription None      Home Oxygen   Home Oxygen Device None    Sleep Oxygen Prescription None    Home Exercise Oxygen Prescription None    Home Resting Oxygen Prescription None      Goals/Expected Outcomes   Short Term Goals To learn and understand importance of monitoring SPO2 with pulse oximeter and demonstrate accurate use of the pulse oximeter.;To learn and understand importance of maintaining oxygen saturations>88%;To learn and demonstrate proper pursed lip breathing techniques or other breathing techniques.     Long  Term Goals Verbalizes importance of monitoring SPO2 with pulse oximeter and return demonstration;Maintenance of O2 saturations>88%;Exhibits  proper breathing techniques, such as pursed lip breathing or other method taught during program session    Comments She is not sure how much her PLB is helping. She feels her breathing is better with a nebulizer. She feels she is short of breath when she is at rest. She sees her pulmonologist this month.    Goals/Expected Outcomes Short: Become more profiecient at using PLB. See pulmonologist.  Long: Become independent at using PLB.             Initial Exercise Prescription:  Initial Exercise Prescription - 05/03/20 1700       Date of Initial Exercise RX and Referring Provider   Date 05/03/20    Referring Provider Aleskerov      Recumbant Bike   Level 1    RPM 60    Minutes 15    METs 1      NuStep   Level 1    SPM 80    Minutes 15    METs 1      Arm Ergometer   Level 1    RPM 25    Minutes 15    METs 1      Track   Laps 7    Minutes 15    METs 1      Prescription Details   Frequency (times per week) 3    Duration Progress to 30 minutes of continuous aerobic without signs/symptoms of physical distress      Intensity    THRR 40-80% of Max Heartrate 99-117    Ratings of Perceived Exertion 11-13      Resistance Training   Training Prescription Yes    Weight 2 lb    Reps 10-15             Perform Capillary Blood Glucose checks as needed.  Exercise Prescription Changes:   Exercise Prescription Changes     Row Name 05/03/20 1700 05/25/20 1400 06/23/20 1200         Response to Exercise   Blood Pressure (Admit) 136/64 146/68 134/78     Blood Pressure (Exercise) 190/80 148/68 172/74     Blood Pressure (Exit) 154/86 152/66 144/74     Heart Rate (Admit) 80 bpm 82 bpm 83 bpm     Heart Rate (Exercise) 109 bpm 90 bpm 81 bpm     Heart Rate (Exit) 97 bpm 86 bpm 94 bpm     Oxygen Saturation (Admit) 95 % 92 % 90 %     Oxygen Saturation (Exercise) 89 % 90 % 94 %     Oxygen Saturation (Exit) 93 % 93 % 93 %     Rating of Perceived Exertion (Exercise) 13 13 95     Perceived Dyspnea (Exercise) 3 2 2      Symptoms SOB none none     Duration -- Progress to 30 minutes of  aerobic without signs/symptoms of physical distress Continue with 30 min of aerobic exercise without signs/symptoms of physical distress.     Intensity -- THRR unchanged THRR unchanged           Progression       Progression -- Continue to progress workloads to maintain intensity without signs/symptoms of physical distress. Continue to progress workloads to maintain intensity without signs/symptoms of physical distress.     Average METs -- 1.65 1.4           Resistance Training       Training Prescription -- Yes Yes  Weight -- 2 lb 2 lb     Reps -- 10-15 10-15           NuStep       Level -- 1 --     SPM -- 80 --     Minutes -- 15 --     METs -- 1.65 --             Exercise Comments:   Exercise Comments     Row Name 05/12/20 1539           Exercise Comments First full day of exercise!  Patient was oriented to gym and equipment including functions, settings, policies, and procedures.  Patient's individual exercise  prescription and treatment plan were reviewed.  All starting workloads were established based on the results of the 6 minute walk test done at initial orientation visit.  The plan for exercise progression was also introduced and progression will be customized based on patient's performance and goals.                Exercise Goals and Review:   Exercise Goals     Row Name 05/03/20 1709             Exercise Goals   Increase Physical Activity Yes       Intervention Provide advice, education, support and counseling about physical activity/exercise needs.;Develop an individualized exercise prescription for aerobic and resistive training based on initial evaluation findings, risk stratification, comorbidities and participant's personal goals.       Expected Outcomes Short Term: Attend rehab on a regular basis to increase amount of physical activity.;Long Term: Add in home exercise to make exercise part of routine and to increase amount of physical activity.;Long Term: Exercising regularly at least 3-5 days a week.       Increase Strength and Stamina Yes       Intervention Provide advice, education, support and counseling about physical activity/exercise needs.;Develop an individualized exercise prescription for aerobic and resistive training based on initial evaluation findings, risk stratification, comorbidities and participant's personal goals.       Expected Outcomes Short Term: Increase workloads from initial exercise prescription for resistance, speed, and METs.;Short Term: Perform resistance training exercises routinely during rehab and add in resistance training at home;Long Term: Improve cardiorespiratory fitness, muscular endurance and strength as measured by increased METs and functional capacity (6MWT)       Able to understand and use rate of perceived exertion (RPE) scale Yes       Intervention Provide education and explanation on how to use RPE scale       Expected Outcomes Short  Term: Able to use RPE daily in rehab to express subjective intensity level;Long Term:  Able to use RPE to guide intensity level when exercising independently       Able to understand and use Dyspnea scale Yes       Intervention Provide education and explanation on how to use Dyspnea scale       Expected Outcomes Short Term: Able to use Dyspnea scale daily in rehab to express subjective sense of shortness of breath during exertion;Long Term: Able to use Dyspnea scale to guide intensity level when exercising independently       Knowledge and understanding of Target Heart Rate Range (THRR) Yes       Intervention Provide education and explanation of THRR including how the numbers were predicted and where they are located for reference       Expected  Outcomes Short Term: Able to state/look up THRR;Short Term: Able to use daily as guideline for intensity in rehab;Long Term: Able to use THRR to govern intensity when exercising independently       Able to check pulse independently Yes       Intervention Provide education and demonstration on how to check pulse in carotid and radial arteries.;Review the importance of being able to check your own pulse for safety during independent exercise       Expected Outcomes Short Term: Able to explain why pulse checking is important during independent exercise;Long Term: Able to check pulse independently and accurately       Understanding of Exercise Prescription Yes       Intervention Provide education, explanation, and written materials on patient's individual exercise prescription       Expected Outcomes Short Term: Able to explain program exercise prescription;Long Term: Able to explain home exercise prescription to exercise independently                Exercise Goals Re-Evaluation :  Exercise Goals Re-Evaluation     Row Name 05/12/20 1540 05/25/20 1405 06/10/20 0847 06/21/20 1534 07/05/20 1519     Exercise Goal Re-Evaluation   Exercise Goals Review  Increase Physical Activity;Able to understand and use rate of perceived exertion (RPE) scale;Knowledge and understanding of Target Heart Rate Range (THRR);Understanding of Exercise Prescription;Increase Strength and Stamina;Able to understand and use Dyspnea scale;Able to check pulse independently Increase Physical Activity;Increase Strength and Stamina -- Increase Physical Activity;Increase Strength and Stamina --   Comments Reviewed RPE and dyspnea scales, THR and program prescription with pt today.  Pt voiced understanding and was given a copy of goals to take home. Lizette does well with the NS.  She has tried the track and got up to 3 laps.  Staff will monitor progress. Out since last review (5/11) Trenae has been absent consistently due to iron infusions making her not feel well. She has been to rehab 6 times since starting in April. EP has not gone over home exercise yet for this reason. She is doing some leg exercises at home - she is doing leg lifts at home to keep moving and she will stretch. She also likes be in the garden. Out since last review   Expected Outcomes Short: Use RPE daily to regulate intensity. Long: Follow program prescription in THR. Short: attend consistently Long:  build overall stamina -- Short: attend consistently, EP to go over home exercise. Long:  build overall stamina --            Discharge Exercise Prescription (Final Exercise Prescription Changes):  Exercise Prescription Changes - 06/23/20 1200       Response to Exercise   Blood Pressure (Admit) 134/78    Blood Pressure (Exercise) 172/74    Blood Pressure (Exit) 144/74    Heart Rate (Admit) 83 bpm    Heart Rate (Exercise) 81 bpm    Heart Rate (Exit) 94 bpm    Oxygen Saturation (Admit) 90 %    Oxygen Saturation (Exercise) 94 %    Oxygen Saturation (Exit) 93 %    Rating of Perceived Exertion (Exercise) 95    Perceived Dyspnea (Exercise) 2    Symptoms none    Duration Continue with 30 min of aerobic exercise  without signs/symptoms of physical distress.    Intensity THRR unchanged      Progression   Progression Continue to progress workloads to maintain intensity without signs/symptoms of physical distress.  Average METs 1.4      Resistance Training   Training Prescription Yes    Weight 2 lb    Reps 10-15             Nutrition:  Target Goals: Understanding of nutrition guidelines, daily intake of sodium 1500mg , cholesterol 200mg , calories 30% from fat and 7% or less from saturated fats, daily to have 5 or more servings of fruits and vegetables.  Education: All About Nutrition: -Group instruction provided by verbal, written material, interactive activities, discussions, models, and posters to present general guidelines for heart healthy nutrition including fat, fiber, MyPlate, the role of sodium in heart healthy nutrition, utilization of the nutrition label, and utilization of this knowledge for meal planning. Follow up email sent as well. Written material given at graduation.   Biometrics:  Pre Biometrics - 05/03/20 1709       Pre Biometrics   Height 5\' 7"  (1.702 m)    Weight 183 lb 14.4 oz (83.4 kg)    BMI (Calculated) 28.8              Nutrition Therapy Plan and Nutrition Goals:  Nutrition Therapy & Goals - 05/17/20 1339       Nutrition Therapy   Diet Heart healthy, low Na    Protein (specify units) 65g    Fiber 25 grams    Whole Grain Foods 3 servings    Saturated Fats 12 max. grams    Fruits and Vegetables 8 servings/day    Sodium 1.5 grams      Personal Nutrition Goals   Nutrition Goal ST: check blood work for iron LT: meet nutritional needs    Comments B: cereal (soy milk) and banana, cinnamon rolls, or eggs with toast. always OJ L: fruit and cottage cheese or yogurt - sometimes tomato sandwich D: vegetables and meat (doctor told her to eat lots of beef). Example: chicken, cooked cabbage, potatoes, butter beans - she ate more than usual. S: candy and ice  cream. She reports having a good variety of fruits and vegetables. She uses butter and olive oil. She does not eat salt with her cooking. She uses white bread and rye bread. Drinks: water, sometimes milk with her dinner. She takes a vitamin C, B12, and vitamin D. Her iron was low, new bloodwork will show if still low. Discussed other iron options such as pumpkin seeds, chickpeas, and lentils. Discussed heart healthy eating.      Intervention Plan   Intervention Prescribe, educate and counsel regarding individualized specific dietary modifications aiming towards targeted core components such as weight, hypertension, lipid management, diabetes, heart failure and other comorbidities.;Nutrition handout(s) given to patient.    Expected Outcomes Short Term Goal: Understand basic principles of dietary content, such as calories, fat, sodium, cholesterol and nutrients.;Short Term Goal: A plan has been developed with personal nutrition goals set during dietitian appointment.;Long Term Goal: Adherence to prescribed nutrition plan.             Nutrition Assessments:  MEDIFICTS Score Key: ?70 Need to make dietary changes  40-70 Heart Healthy Diet ? 40 Therapeutic Level Cholesterol Diet  Flowsheet Row Pulmonary Rehab from 05/03/2020 in [facility]  Picture Your Plate Total Score on Admission 65      Picture Your Plate Scores: <11 Unhealthy dietary pattern with much room for improvement. 41-50 Dietary pattern unlikely to meet recommendations for good health and room for improvement. 51-60 More healthful dietary pattern, with some room for improvement.  >  60 Healthy dietary pattern, although there may be some specific behaviors that could be improved.   Nutrition Goals Re-Evaluation:  Nutrition Goals Re-Evaluation     [place] Name 06/21/20 1605             Goals   Nutrition Goal ST: continue to include iron rich foods (meat, chickpeas, lentils) LT: meet nutritional needs        Comment She reports eating well and she is just concerned for her iron, she eats beets and meat to help. Discussed other iron rich foods and pairing with vitamin C like lemon juice to help absorption. Her MD said it doesn't matter what she eats, her stomach will still hurts.       Expected Outcome ST: continue to include iron rich foods (meat, chickpeas, lentils) LT: meet nutritional needs                Nutrition Goals Discharge (Final Nutrition Goals Re-Evaluation):  Nutrition Goals Re-Evaluation - 06/21/20 1605       Goals   Nutrition Goal ST: continue to include iron rich foods (meat, chickpeas, lentils) LT: meet nutritional needs    Comment She reports eating well and she is just concerned for her iron, she eats beets and meat to help. Discussed other iron rich foods and pairing with vitamin C like lemon juice to help absorption. Her MD said it doesn't matter what she eats, her stomach will still hurts.    Expected Outcome ST: continue to include iron rich foods (meat, chickpeas, lentils) LT: meet nutritional needs             Psychosocial: Target Goals: Acknowledge presence or absence of significant depression and/or stress, maximize coping skills, provide positive support system. Participant is able to verbalize types and ability to use techniques and skills needed for reducing stress and depression.   Education: Stress, Anxiety, and Depression - Group verbal and visual presentation to define topics covered.  Reviews how body is impacted by stress, anxiety, and depression.  Also discusses healthy ways to reduce stress and to treat/manage anxiety and depression.  Written material given at graduation.   Education: Sleep Hygiene -Provides group verbal and written instruction about how sleep can affect your health.  Define sleep hygiene, discuss sleep cycles and impact of sleep habits. Review good sleep hygiene tips.    Initial Review & Psychosocial Screening:  Initial Psych  Review & Screening - 04/21/20 1545       Initial Review   Current issues with Current Psychotropic Meds;Current Anxiety/Panic      Family Dynamics   Good Support System? Yes    Comments Hanan has general anxiety since she lost her husband and daughter. She lives alone and the pandemic has not helped with her anxiety. She can look to God and Son in-law for support. She has great neighbors and two really good friends at [employer].      Barriers   Psychosocial barriers to participate in program The patient should benefit from training in stress management and relaxation.      Screening Interventions   Interventions Encouraged to exercise;To provide support and resources with identified psychosocial needs;Provide feedback about the scores to participant    Expected Outcomes Short Term goal: Utilizing psychosocial counselor, staff and physician to assist with identification of specific Stressors or current issues interfering with healing process. Setting desired goal for each stressor or current issue identified.;Long Term Goal: Stressors or current issues are controlled or eliminated.;Short Term goal:  Identification and review with participant of any Quality of Life or Depression concerns found by scoring the questionnaire.;Long Term goal: The participant improves quality of Life and PHQ9 Scores as seen by post scores and/or verbalization of changes             Quality of Life Scores:  Scores of 19 and below usually indicate a poorer quality of life in these areas.  A difference of  2-3 points is a clinically meaningful difference.  A difference of 2-3 points in the total score of the Quality of Life Index has been associated with significant improvement in overall quality of life, self-image, physical symptoms, and general health in studies assessing change in quality of life.  PHQ-9: Recent Review Flowsheet Data     Depression screen [facility] 2/9 06/14/2020 05/03/2020   Decreased Interest 2 1    Down, Depressed, Hopeless 0 0   PHQ - 2 Score 2 1   Altered sleeping 0 0   Tired, decreased energy 3 3   Change in appetite 1 0   Feeling bad or failure about yourself  0 0   Trouble concentrating 0 3   Moving slowly or fidgety/restless 1 0   Suicidal thoughts 0 0   PHQ-9 Score 7 7   Difficult doing work/chores Somewhat difficult -      Interpretation of Total Score  Total Score Depression Severity:  1-4 = Minimal depression, 5-9 = Mild depression, 10-14 = Moderate depression, 15-19 = Moderately severe depression, 20-27 = Severe depression   Psychosocial Evaluation and Intervention:  Psychosocial Evaluation - 04/21/20 1548       Psychosocial Evaluation & Interventions   Interventions Encouraged to exercise with the program and follow exercise prescription;Relaxation education;Stress management education    Comments Verina has general anxiety since she lost her husband and daughter. She lives alone and the pandemic has not helped with her anxiety. She can look to God and Son in-law for support. She has great neighbors and two really good friends at [employer].    Expected Outcomes Short: Exercise regularly to support mental health and notify staff of any changes. Long: maintain mental health and well being through teaching of rehab or prescribed medications independently.    Continue Psychosocial Services  Follow up required by staff             Psychosocial Re-Evaluation:  Psychosocial Re-Evaluation     [place] Name 06/21/20 1548             Psychosocial Re-Evaluation   Current issues with Current Depression;Current Sleep Concerns       Comments She reports a lot of loss recently - she lost two sisters in the last year and she lost her daughter 2 years ago. She reports feeling difficult to deal with as she feels she is alone - as everyone she used to know is passed. She relies on her caretaker for the most part. She is on medication and she feels like it help. She reports  not sleeping well - her arthritis hurts her badly and it bothers her sleep.       Expected Outcomes ST: continue to come to rehab LT: feel like she is not alone and increases positivity       Interventions Encouraged to attend Pulmonary Rehabilitation for the exercise       Continue Psychosocial Services  Follow up required by staff  Psychosocial Discharge (Final Psychosocial Re-Evaluation):  Psychosocial Re-Evaluation - 06/21/20 1548       Psychosocial Re-Evaluation   Current issues with Current Depression;Current Sleep Concerns    Comments She reports a lot of loss recently - she lost two sisters in the last year and she lost her daughter 2 years ago. She reports feeling difficult to deal with as she feels she is alone - as everyone she used to know is passed. She relies on her caretaker for the most part. She is on medication and she feels like it help. She reports not sleeping well - her arthritis hurts her badly and it bothers her sleep.    Expected Outcomes ST: continue to come to rehab LT: feel like she is not alone and increases positivity    Interventions Encouraged to attend Pulmonary Rehabilitation for the exercise    Continue Psychosocial Services  Follow up required by staff             Education: Education Goals: Education classes will be provided on a weekly basis, covering required topics. Participant will state understanding/return demonstration of topics presented.  Learning Barriers/Preferences:  Learning Barriers/Preferences - 04/21/20 1544       Learning Barriers/Preferences   Learning Barriers None    Learning Preferences None             General Pulmonary Education Topics:  Infection Prevention: - Provides verbal and written material to individual with discussion of infection control including proper hand washing and proper equipment cleaning during exercise session. Flowsheet Row Pulmonary Rehab from 05/12/2020 in [facility] and  Pulmonary Rehab  Date 05/03/20  Educator AS  Instruction Review Code 1- Verbalizes Understanding       Falls Prevention: - Provides verbal and written material to individual with discussion of falls prevention and safety. Flowsheet Row Pulmonary Rehab from 05/12/2020 in [facility] Card[facility] and Pulmonary Rehab  Date 05/03/20  Educator AS  Instruction Review Code 1- Verbalizes Understanding       Chronic Lung Disease Review: - Group verbal instruction with posters, models, PowerPoint presentations and videos,  to review new updates, new respiratory medications, new advancements in procedures and treatments. Providing information on websites and "800" numbers for continued self-education. Includes information about supplement oxygen, available portable oxygen systems, continuous and intermittent flow rates, oxygen safety, concentrators, and Medicare reimbursement for oxygen. Explanation of Pulmonary Drugs, including class, frequency, complications, importance of spacers, rinsing mouth after steroid MDI's, and proper cleaning methods for nebulizers. Review of basic lung anatomy and physiology related to function, structure, and complications of lung disease. Review of risk factors. Discussion about methods for diagnosing sleep apnea and types of masks and machines for OSA. Includes a review of the use of types of environmental controls: home humidity, furnaces, filters, dust mite/pet prevention, HEPA vacuums. Discussion about weather changes, air quality and the benefits of nasal washing. Instruction on Warning signs, infection symptoms, calling MD promptly, preventive modes, and value of vaccinations. Review of effective airway clearance, coughing and/or vibration techniques. Emphasizing that all should Create an Action Plan. Written material given at graduation.   AED/CPR: - Group verbal and written instruction with the use of models to demonstrate the basic use of the AED with the basic ABC's of  resuscitation.    Anatomy and Cardiac Procedures: - Group verbal and visual presentation and models provide information about basic cardiac anatomy and function. Reviews the testing methods done to diagnose heart disease and the outcomes of the test results. Describes  the treatment choices: Medical Management, Angioplasty, or Coronary Bypass Surgery for treating various heart conditions including Myocardial Infarction, Angina, Valve Disease, and Cardiac Arrhythmias.  Written material given at graduation.   Medication Safety: - Group verbal and visual instruction to review commonly prescribed medications for heart and lung disease. Reviews the medication, class of the drug, and side effects. Includes the steps to properly store meds and maintain the prescription regimen.  Written material given at graduation.   Other: -Provides group and verbal instruction on various topics (see comments)   Knowledge Questionnaire Score:  Knowledge Questionnaire Score - 05/03/20 1710       Knowledge Questionnaire Score   Pre Score 16/18              Core Components/Risk Factors/Patient Goals at Admission:  Personal Goals and Risk Factors at Admission - 05/03/20 1712       Core Components/Risk Factors/Patient Goals on Admission    Weight Management Yes;Weight Loss    Intervention Weight Management: Develop a combined nutrition and exercise program designed to reach desired caloric intake, while maintaining appropriate intake of nutrient and fiber, sodium and fats, and appropriate energy expenditure required for the weight goal.;Weight Management: Provide education and appropriate resources to help participant work on and attain dietary goals.;Weight Management/Obesity: Establish reasonable short term and long term weight goals.    Expected Outcomes Short Term: Continue to assess and modify interventions until short term weight is achieved;Long Term: Adherence to nutrition and physical  activity/exercise program aimed toward attainment of established weight goal;Weight Loss: Understanding of general recommendations for a balanced deficit meal plan, which promotes 1-2 lb weight loss per week and includes a negative energy balance of [phone] kcal/d;Understanding recommendations for meals to include 15-35% energy as protein, 25-35% energy from fat, 35-60% energy from carbohydrates, less than 200mg  of dietary cholesterol, 20-35 gm of total fiber daily;Understanding of distribution of calorie intake throughout the day with the consumption of 4-5 meals/snacks    Improve shortness of breath with ADL's Yes    Intervention Provide education, individualized exercise plan and daily activity instruction to help decrease symptoms of SOB with activities of daily living.    Expected Outcomes Short Term: Improve cardiorespiratory fitness to achieve a reduction of symptoms when performing ADLs;Long Term: Be able to perform more ADLs without symptoms or delay the onset of symptoms    Hypertension Yes    Intervention Provide education on lifestyle modifcations including regular physical activity/exercise, weight management, moderate sodium restriction and increased consumption of fresh fruit, vegetables, and low fat dairy, alcohol moderation, and smoking cessation.;Monitor prescription use compliance.    Expected Outcomes Short Term: Continued assessment and intervention until BP is < 140/51mm HG in hypertensive participants. < 130/1mm HG in hypertensive participants with diabetes, heart failure or chronic kidney disease.;Long Term: Maintenance of blood pressure at goal levels.    Lipids Yes    Intervention Provide education and support for participant on nutrition & aerobic/resistive exercise along with prescribed medications to achieve LDL 70mg , HDL >40mg .    Expected Outcomes Short Term: Participant states understanding of desired cholesterol values and is compliant with medications prescribed.  Participant is following exercise prescription and nutrition guidelines.;Long Term: Cholesterol controlled with medications as prescribed, with individualized exercise RX and with personalized nutrition plan. Value goals: LDL < 70mg , HDL > 40 mg.             Education:Diabetes - Individual verbal and written instruction to review signs/symptoms of diabetes, desired ranges of glucose  level fasting, after meals and with exercise. Acknowledge that pre and post exercise glucose checks will be done for 3 sessions at entry of program.   Know Your Numbers and Heart Failure: - Group verbal and visual instruction to discuss disease risk factors for cardiac and pulmonary disease and treatment options.  Reviews associated critical values for Overweight/Obesity, Hypertension, Cholesterol, and Diabetes.  Discusses basics of heart failure: signs/symptoms and treatments.  Introduces Heart Failure Zone chart for action plan for heart failure.  Written material given at graduation.   Core Components/Risk Factors/Patient Goals Review:   Goals and Risk Factor Review     Row Name 06/21/20 1532             Core Components/Risk Factors/Patient Goals Review   Personal Goals Review Improve shortness of breath with ADL's;Hypertension;Develop more efficient breathing techniques such as purse lipped breathing and diaphragmatic breathing and practicing self-pacing with activity.       Review Harmani has been absent consistently due to iron infusions making her not feel well. She has been to rehab 6 times since starting in April. She feels that she is short of breath at rest too. She continues to practice PLB. She continues to try and stay active at home and attend rehab. BP today 138/74 - she does not have a BP cuff at home - encouraged to get cuff - she is not interested.       Expected Outcomes ST: attend rehab consistently LT: monitor risk factors                Core Components/Risk Factors/Patient Goals at  Discharge (Final Review):   Goals and Risk Factor Review - 06/21/20 1532       Core Components/Risk Factors/Patient Goals Review   Personal Goals Review Improve shortness of breath with ADL's;Hypertension;Develop more efficient breathing techniques such as purse lipped breathing and diaphragmatic breathing and practicing self-pacing with activity.    Review Tytianna has been absent consistently due to iron infusions making her not feel well. She has been to rehab 6 times since starting in April. She feels that she is short of breath at rest too. She continues to practice PLB. She continues to try and stay active at home and attend rehab. BP today 138/74 - she does not have a BP cuff at home - encouraged to get cuff - she is not interested.    Expected Outcomes ST: attend rehab consistently LT: monitor risk factors             ITP Comments:  ITP Comments     Row Name 04/21/20 1551 05/03/20 1716 05/12/20 1539 05/17/20 1418 05/26/20 0754   ITP Comments Virtual Visit completed. Patient informed on EP and RD appointment and 6 Minute walk test. Patient also informed of patient health questionnaires on My Chart. Patient Verbalizes understanding. Visit diagnosis can be found in [facility] 04/05/2020. Completed 6MWT and gym orientation. Initial ITP created and sent for review to Dr. Emily Filbert, Medical Director. First full day of exercise!  Patient was oriented to gym and equipment including functions, settings, policies, and procedures.  Patient's individual exercise prescription and treatment plan were reviewed.  All starting workloads were established based on the results of the 6 minute walk test done at initial orientation visit.  The plan for exercise progression was also introduced and progression will be customized based on patient's performance and goals. Completed initial RD Evaluation 30 Day review completed. Medical Director ITP review done, changes made as directed,  and signed approval by Medical  Director.    [place] Name 06/10/20 5750 06/23/20 0903 07/05/20 1516 07/07/20 1340     ITP Comments Called to check on patient.  She has another infusion today and really lacking energy.  She is aiming to come in on Monday. Out since 05/19/20. 30 Day review completed. Medical Director ITP review done, changes made as directed, and signed approval by Medical Director. Lupita has not attended since 6/13.  She has either called out or not shown up to class. Today, she had a pulmonolgy appointment. Devony has been feeling weak and would like to be discharged at this time.             Comments: Discharge ITP

## 2020-07-30 ENCOUNTER — Inpatient Hospital Stay: Payer: Medicare Other | Attending: Oncology

## 2020-07-30 ENCOUNTER — Other Ambulatory Visit: Payer: Self-pay

## 2020-07-30 DIAGNOSIS — D500 Iron deficiency anemia secondary to blood loss (chronic): Secondary | ICD-10-CM | POA: Insufficient documentation

## 2020-07-30 DIAGNOSIS — D509 Iron deficiency anemia, unspecified: Secondary | ICD-10-CM

## 2020-07-30 LAB — CBC WITH DIFFERENTIAL/PLATELET
Abs Immature Granulocytes: 0.01 10*3/uL (ref 0.00–0.07)
Basophils Absolute: 0.1 10*3/uL (ref 0.0–0.1)
Basophils Relative: 2 %
Eosinophils Absolute: 0.3 10*3/uL (ref 0.0–0.5)
Eosinophils Relative: 5 %
HCT: 35.1 % — ABNORMAL LOW (ref 36.0–46.0)
Hemoglobin: 11.3 g/dL — ABNORMAL LOW (ref 12.0–15.0)
Immature Granulocytes: 0 %
Lymphocytes Relative: 16 %
Lymphs Abs: 1 10*3/uL (ref 0.7–4.0)
MCH: 26.3 pg (ref 26.0–34.0)
MCHC: 32.2 g/dL (ref 30.0–36.0)
MCV: 81.8 fL (ref 80.0–100.0)
Monocytes Absolute: 0.8 10*3/uL (ref 0.1–1.0)
Monocytes Relative: 13 %
Neutro Abs: 4.2 10*3/uL (ref 1.7–7.7)
Neutrophils Relative %: 64 %
Platelets: 277 10*3/uL (ref 150–400)
RBC: 4.29 MIL/uL (ref 3.87–5.11)
RDW: 20.8 % — ABNORMAL HIGH (ref 11.5–15.5)
WBC: 6.5 10*3/uL (ref 4.0–10.5)
nRBC: 0 % (ref 0.0–0.2)

## 2020-07-30 LAB — IRON AND TIBC
Iron: 36 ug/dL (ref 28–170)
Saturation Ratios: 11 % (ref 10.4–31.8)
TIBC: 335 ug/dL (ref 250–450)
UIBC: 299 ug/dL

## 2020-07-30 LAB — FERRITIN: Ferritin: 45 ng/mL (ref 11–307)

## 2020-09-16 DIAGNOSIS — C060 Malignant neoplasm of cheek mucosa: Secondary | ICD-10-CM | POA: Diagnosis present

## 2020-10-01 ENCOUNTER — Other Ambulatory Visit: Payer: Self-pay

## 2020-10-01 ENCOUNTER — Inpatient Hospital Stay: Payer: Medicare Other | Attending: Oncology

## 2020-10-01 ENCOUNTER — Encounter: Payer: Self-pay | Admitting: Oncology

## 2020-10-01 ENCOUNTER — Inpatient Hospital Stay (HOSPITAL_BASED_OUTPATIENT_CLINIC_OR_DEPARTMENT_OTHER): Payer: Medicare Other | Admitting: Oncology

## 2020-10-01 VITALS — BP 147/52 | HR 77 | Temp 96.2°F | Resp 18 | Wt 182.2 lb

## 2020-10-01 DIAGNOSIS — Z87891 Personal history of nicotine dependence: Secondary | ICD-10-CM | POA: Insufficient documentation

## 2020-10-01 DIAGNOSIS — K219 Gastro-esophageal reflux disease without esophagitis: Secondary | ICD-10-CM | POA: Diagnosis not present

## 2020-10-01 DIAGNOSIS — J449 Chronic obstructive pulmonary disease, unspecified: Secondary | ICD-10-CM | POA: Diagnosis not present

## 2020-10-01 DIAGNOSIS — D509 Iron deficiency anemia, unspecified: Secondary | ICD-10-CM | POA: Insufficient documentation

## 2020-10-01 DIAGNOSIS — I10 Essential (primary) hypertension: Secondary | ICD-10-CM | POA: Diagnosis not present

## 2020-10-01 DIAGNOSIS — Z79899 Other long term (current) drug therapy: Secondary | ICD-10-CM | POA: Diagnosis not present

## 2020-10-01 DIAGNOSIS — Z85828 Personal history of other malignant neoplasm of skin: Secondary | ICD-10-CM | POA: Insufficient documentation

## 2020-10-01 DIAGNOSIS — E785 Hyperlipidemia, unspecified: Secondary | ICD-10-CM | POA: Insufficient documentation

## 2020-10-01 LAB — CBC WITH DIFFERENTIAL/PLATELET
Abs Immature Granulocytes: 0.03 10*3/uL (ref 0.00–0.07)
Basophils Absolute: 0.2 10*3/uL — ABNORMAL HIGH (ref 0.0–0.1)
Basophils Relative: 2 %
Eosinophils Absolute: 0.5 10*3/uL (ref 0.0–0.5)
Eosinophils Relative: 5 %
HCT: 34.4 % — ABNORMAL LOW (ref 36.0–46.0)
Hemoglobin: 10.9 g/dL — ABNORMAL LOW (ref 12.0–15.0)
Immature Granulocytes: 0 %
Lymphocytes Relative: 10 %
Lymphs Abs: 0.9 10*3/uL (ref 0.7–4.0)
MCH: 25.1 pg — ABNORMAL LOW (ref 26.0–34.0)
MCHC: 31.7 g/dL (ref 30.0–36.0)
MCV: 79.3 fL — ABNORMAL LOW (ref 80.0–100.0)
Monocytes Absolute: 1.2 10*3/uL — ABNORMAL HIGH (ref 0.1–1.0)
Monocytes Relative: 13 %
Neutro Abs: 6.2 10*3/uL (ref 1.7–7.7)
Neutrophils Relative %: 70 %
Platelets: 356 10*3/uL (ref 150–400)
RBC: 4.34 MIL/uL (ref 3.87–5.11)
RDW: 14.8 % (ref 11.5–15.5)
WBC: 8.9 10*3/uL (ref 4.0–10.5)
nRBC: 0 % (ref 0.0–0.2)

## 2020-10-01 LAB — IRON AND TIBC
Iron: 27 ug/dL — ABNORMAL LOW (ref 28–170)
Saturation Ratios: 7 % — ABNORMAL LOW (ref 10.4–31.8)
TIBC: 396 ug/dL (ref 250–450)
UIBC: 369 ug/dL

## 2020-10-01 LAB — FERRITIN: Ferritin: 16 ng/mL (ref 11–307)

## 2020-10-01 NOTE — Progress Notes (Signed)
Hematology/Oncology Consult note [facility]  Telephone:([phone] Fax:([phone]  Patient Care Team: Kirk Ruths, MD as PCP - General (Internal Medicine) Sindy Guadeloupe, MD as Consulting Physician (Hematology and Oncology)   Name of the patient: [name] [name]  [id number]  29-Oct-1925   Date of visit: 10/12/20  Diagnosis- iron deficiency anemia  Chief complaint/ Reason for visit- routine f/u of iron deficiency anemia  Heme/Onc history: Patient is a 87 year old female who was referred to the clinic in October 2017 for chronic iron deficiency anemia with history of diverticulosis. She has been unable to tolerate oral iron supplements due to abdominal cramping and diarrhea. She has required several blood transfusions in the past and received regular IV iron infusions. She has tolerated these without complications. Iron and blood transfusions have been given on a palliative basis with a goal hemoglobin of >10.    March 2018 she underwent an EGD and colonoscopy which did not reveal source of bleeding.   May  2/18 - enteroscopy revealed 3 avms which were ablated. She suffered melena and had a hemoglobin of 6.7 and was hospitalized. At that time, enteroscopy was repeated which was normal. She received a blood transfusion and her hemoglobin was 9.6.    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    08/07/2016 Capsule study showed active bleeding from single spot in proximal jejunum. She underwent push enteroscopy with argon laser therapy but no bleeding was found  Interval history-reports low energy and exertional shortness of breath.  ECOG PS- 1 Pain scale- 0  Review of systems- Review of Systems  Constitutional:  Positive for malaise/fatigue. Negative for chills, fever and weight loss.  HENT:  Negative for congestion, ear pain and tinnitus.   Eyes: Negative.  Negative for blurred  vision and double vision.  Respiratory:  Positive for shortness of breath. Negative for cough and sputum production.   Cardiovascular: Negative.  Negative for chest pain, palpitations and leg swelling.  Gastrointestinal: Negative.  Negative for abdominal pain, constipation, diarrhea, nausea and vomiting.  Genitourinary:  Negative for dysuria, frequency and urgency.  Musculoskeletal:  Negative for back pain and falls.  Skin: Negative.  Negative for rash.  Neurological: Negative.  Negative for weakness and headaches.  Endo/Heme/Allergies: Negative.  Does not bruise/bleed easily.  Psychiatric/Behavioral: Negative.  Negative for depression. The patient is not nervous/anxious and does not have insomnia.      Allergies  Allergen Reactions   Nsaids Hives   Dextrans Hives   Fentanyl Itching   Lipitor [Atorvastatin] Other (See Comments)    Reaction: Muscle pain   Lovastatin    Mobic [Meloxicam] Other (See Comments)    Reaction: Mouth and tongue ulcers   Niacin And Related Itching   Other    Polysaccharide K Hives   Pravachol [Pravastatin Sodium] Other (See Comments)    Reaction: Muscle pain   Statins Other (See Comments)   Tolmetin Hives   Voltaren [Diclofenac Sodium] Other (See Comments)    Reaction: Mouth and tongue ulcers   Zetia [Ezetimibe] Other (See Comments)    Reaction: Leg pain   Codeine Nausea And Vomiting   Niacin Itching     Past Medical History:  Diagnosis Date   Actinic keratosis    Anemia    COPD (chronic obstructive pulmonary disease) (HCC)    Diverticulitis    GERD (gastroesophageal reflux disease)    Glaucoma    HLD (hyperlipidemia)  Hypertension    Iron deficiency    Rectal bleeding    Skin cancer    Nose, txted by Dr. Valere Dross at [facility]     Past Surgical History:  Procedure Laterality Date   ABDOMINAL HYSTERECTOMY     APPENDECTOMY     COLONOSCOPY WITH PROPOFOL N/A 04/14/2016   Procedure: COLONOSCOPY WITH PROPOFOL;  Surgeon: Lucilla Lame, MD;   Location: [facility] ENDOSCOPY;  Service: Endoscopy;  Laterality: N/A;   ENTEROSCOPY N/A 05/10/2016   Procedure: Push ENTEROSCOPY with pediatric colonoscope;  Surgeon: [name], MD;  Location: [facility] ENDOSCOPY;  Service: Endoscopy;  Laterality: N/A;   ENTEROSCOPY N/A 05/30/2016   Procedure: push ENTEROSCOPY;  Surgeon: [name], MD;  Location: [facility] ENDOSCOPY;  Service: Endoscopy;  Laterality: N/A;   ENTEROSCOPY N/A 07/05/2017   Procedure: ENTEROSCOPY;  Surgeon: [name], MD;  Location: [facility] ENDOSCOPY;  Service: Gastroenterology;  Laterality: N/A;   ENTEROSCOPY N/A 06/21/2018   Procedure: ENTEROSCOPY;  Surgeon: [name], MD;  Location: [facility] ENDOSCOPY;  Service: Gastroenterology;  Laterality: N/A;   ESOPHAGOGASTRODUODENOSCOPY (EGD) WITH PROPOFOL N/A 08/12/2015   Procedure: ESOPHAGOGASTRODUODENOSCOPY (EGD) WITH PROPOFOL;  Surgeon: Lollie Sails, MD;  Location: [facility] ENDOSCOPY;  Service: Endoscopy;  Laterality: N/A;   ESOPHAGOGASTRODUODENOSCOPY (EGD) WITH PROPOFOL N/A 04/03/2016   Procedure: ESOPHAGOGASTRODUODENOSCOPY (EGD) WITH PROPOFOL;  Surgeon: Lucilla Lame, MD;  Location: [facility] ENDOSCOPY;  Service: Endoscopy;  Laterality: N/A;   GIVENS CAPSULE STUDY N/A 04/28/2016   Procedure: GIVENS CAPSULE STUDY;  Surgeon: [name], MD;  Location: [facility] ENDOSCOPY;  Service: Endoscopy;  Laterality: N/A;   GIVENS CAPSULE STUDY N/A 06/21/2017   Procedure: GIVENS CAPSULE STUDY;  Surgeon: [name], MD;  Location: [facility] ENDOSCOPY;  Service: Gastroenterology;  Laterality: N/A;   JOINT REPLACEMENT      Social History   Socioeconomic History   Marital status: Widowed    Spouse name: Not on file   Number of children: Not on file   Years of education: Not on file   Highest education level: Not on file  Occupational History   Not on file  Tobacco Use   Smoking status: Former    Packs/day: 2.00    Years: 20.00    Pack years: 40.00    Types: Cigarettes    Quit date: 04/22/1979    Years since quitting: 41.5    Smokeless tobacco: Never  Vaping Use   Vaping Use: Never used  Substance and Sexual Activity   Alcohol use: Yes    Alcohol/week: 1.0 standard drink    Types: 1 Glasses of wine per week    Comment: glass wine every 2 months   Drug use: No   Sexual activity: Not Currently    Birth control/protection: Post-menopausal  Other Topics Concern   Not on file  Social History Narrative   Not on file   Social Determinants of Health   Financial Resource Strain: Not on file  Food Insecurity: Not on file  Transportation Needs: Not on file  Physical Activity: Not on file  Stress: Not on file  Social Connections: Not on file  Intimate Partner Violence: Not on file    Family History  Problem Relation Age of Onset   CAD Mother    CAD Father    CAD Brother      Current Outpatient Medications:    acetaminophen (TYLENOL) 500 MG tablet, Take 500-1,000 mg by mouth every 6 (six) hours as needed for mild pain, moderate pain or fever., Disp: , Rfl:    acidophilus (RISAQUAD)  CAPS capsule, Take 1 capsule by mouth daily. (Patient taking differently: Take 1 capsule by mouth in the morning and at bedtime.), Disp: 14 capsule, Rfl: 0   chlorthalidone (HYGROTON) 25 MG tablet, Take 0.5 tablets (12.5 mg total) by mouth daily. May resume on 11/1 if diarrhea resolves, Disp: , Rfl:    Cyanocobalamin (B-12) 1000 MCG CAPS, Take 2,000 Units by mouth daily. , Disp: , Rfl:    cycloSPORINE (RESTASIS) 0.05 % ophthalmic emulsion, Place 1 drop into both eyes 2 (two) times daily., Disp: , Rfl:    escitalopram (LEXAPRO) 20 MG tablet, Take 20 mg by mouth daily., Disp: , Rfl:    gabapentin (NEURONTIN) 300 MG capsule, Take 300 mg by mouth 2 (two) times daily., Disp: , Rfl:    hydrochlorothiazide (HYDRODIURIL) 12.5 MG tablet, Take 12.5 mg by mouth daily., Disp: , Rfl:    ipratropium-albuterol (DUONEB) 0.5-2.5 (3) MG/3ML SOLN, Inhale into the lungs., Disp: , Rfl:    losartan (COZAAR) 50 MG tablet, Take 50 mg by mouth daily.,  Disp: , Rfl:    mometasone (ELOCON) 0.1 % lotion, Apply 1 application topically daily as needed (skin irritation)., Disp: , Rfl:    Multiple Vitamins-Minerals (OCUVITE PRESERVISION PO), Take 1 tablet by mouth 2 (two) times daily., Disp: , Rfl:    potassium chloride (KLOR-CON) 10 MEQ tablet, Take 10 mEq by mouth 3 (three) times daily., Disp: , Rfl:    RABEprazole (ACIPHEX) 20 MG tablet, Take 20 mg by mouth 2 (two) times daily., Disp: , Rfl:  No current facility-administered medications for this visit.  Facility-Administered Medications Ordered in Other Visits:    0.9 %  sodium chloride infusion, , Intravenous, Continuous, Sindy Guadeloupe, MD, Last Rate: 0 mL/hr at 10/08/17 1518, New Bag at 06/21/18 0952  Physical exam:  Vitals:   10/01/20 1059  BP: (!) 147/52  Pulse: 77  Resp: 18  Temp: (!) 96.2 F (35.7 C)  SpO2: 95%  Weight: 182 lb 3.2 oz (82.6 kg)   Physical Exam Constitutional:      Appearance: Normal appearance.  HENT:     Head: Normocephalic and atraumatic.  Eyes:     Pupils: Pupils are equal, round, and reactive to light.  Cardiovascular:     Rate and Rhythm: Normal rate and regular rhythm.     Heart sounds: Normal heart sounds. No murmur heard. Pulmonary:     Effort: Pulmonary effort is normal.     Breath sounds: Normal breath sounds. No wheezing.  Abdominal:     General: Bowel sounds are normal. There is no distension.     Palpations: Abdomen is soft.     Tenderness: There is no abdominal tenderness.  Musculoskeletal:        General: Normal range of motion.     Cervical back: Normal range of motion.  Skin:    General: Skin is warm and dry.     Findings: No rash.  Neurological:     Mental Status: She is alert and oriented to person, place, and time.  Psychiatric:        Judgment: Judgment normal.     CMP Latest Ref Rng & Units 11/07/2019  Glucose 70 - 99 mg/dL 103(H)  BUN 8 - 23 mg/dL 19  Creatinine 0.44 - 1.00 mg/dL 0.59  Sodium 135 - 145 mmol/L 131(L)   Potassium 3.5 - 5.1 mmol/L 3.8  Chloride 98 - 111 mmol/L 97(L)  CO2 22 - 32 mmol/L 25  Calcium 8.9 - 10.3 mg/dL 8.6(L)  Total Protein 6.5 - 8.1 g/dL -  Total Bilirubin 0.3 - 1.2 mg/dL -  Alkaline Phos 38 - 126 U/L -  AST 15 - 41 U/L -  ALT 0 - 44 U/L -   CBC Latest Ref Rng & Units 10/01/2020  WBC 4.0 - 10.5 K/uL 8.9  Hemoglobin 12.0 - 15.0 g/dL 10.9(L)  Hematocrit 36.0 - 46.0 % 34.4(L)  Platelets 150 - 400 K/uL 356      Assessment and plan-Mrs. Guidone is a 87 year old female who presents for follow-up for her iron deficiency anemia.  She last received IV iron on 05/31/2020 and 06/10/2020.   Iron deficiency anemia thought to be secondary to GI bleed.  She has undergone extensive GI work-up in the past including EGD, colonoscopy and capsule study with a double-balloon enteroscopy without evidence of bleeding.  Given her age she would like to forego any invasive investigation at this time.  Lab work from 10/01/2020 shows a ferritin of 16 with an iron saturation of 7%.  Hemoglobin is 10.9 and MCV is 79.3.  Recommend 2 additional doses of IV Feraheme.  Recommend she return to clinic in 4 months with additional labs and to see Dr. Janese Banks with possible IV Feraheme.  I spent 25 minutes dedicated to the care of this patient (face-to-face and non-face-to-face) on the date of the encounter to include what is described in the assessment and plan.   Visit Diagnosis 1. Iron deficiency anemia, unspecified iron deficiency anemia type     Faythe Casa, NP 10/12/2020 3:44 PM 3:44 PM

## 2020-10-04 NOTE — Progress Notes (Signed)
Patient scheduled.

## 2020-10-05 ENCOUNTER — Other Ambulatory Visit: Payer: Self-pay | Admitting: Oncology

## 2020-10-05 ENCOUNTER — Inpatient Hospital Stay: Payer: Medicare Other

## 2020-10-05 VITALS — BP 167/79 | HR 78 | Temp 97.1°F | Resp 18

## 2020-10-05 DIAGNOSIS — D509 Iron deficiency anemia, unspecified: Secondary | ICD-10-CM

## 2020-10-05 DIAGNOSIS — D500 Iron deficiency anemia secondary to blood loss (chronic): Secondary | ICD-10-CM

## 2020-10-05 MED ORDER — SODIUM CHLORIDE 0.9 % IV SOLN
510.0000 mg | Freq: Once | INTRAVENOUS | Status: AC
  Administered 2020-10-05: 510 mg via INTRAVENOUS
  Filled 2020-10-05: qty 510

## 2020-10-05 MED ORDER — SODIUM CHLORIDE 0.9 % IV SOLN
Freq: Once | INTRAVENOUS | Status: AC
  Filled 2020-10-05: qty 250

## 2020-10-08 ENCOUNTER — Inpatient Hospital Stay: Payer: Medicare Other

## 2020-10-08 DIAGNOSIS — D500 Iron deficiency anemia secondary to blood loss (chronic): Secondary | ICD-10-CM

## 2020-10-08 DIAGNOSIS — D509 Iron deficiency anemia, unspecified: Secondary | ICD-10-CM

## 2020-10-08 MED ORDER — SODIUM CHLORIDE 0.9 % IV SOLN
510.0000 mg | Freq: Once | INTRAVENOUS | Status: AC
  Administered 2020-10-08: 510 mg via INTRAVENOUS
  Filled 2020-10-08: qty 510

## 2020-10-08 MED ORDER — SODIUM CHLORIDE 0.9 % IV SOLN
Freq: Once | INTRAVENOUS | Status: AC
  Filled 2020-10-08: qty 250

## 2020-10-12 ENCOUNTER — Encounter: Payer: Self-pay | Admitting: Oncology

## 2020-10-19 ENCOUNTER — Encounter: Payer: Medicare Other | Admitting: Dermatology

## 2020-10-26 ENCOUNTER — Encounter: Payer: Medicare Other | Admitting: Dermatology

## 2021-01-25 ENCOUNTER — Other Ambulatory Visit: Payer: Self-pay | Admitting: *Deleted

## 2021-01-25 DIAGNOSIS — D509 Iron deficiency anemia, unspecified: Secondary | ICD-10-CM

## 2021-01-31 ENCOUNTER — Telehealth: Payer: Self-pay | Admitting: *Deleted

## 2021-01-31 ENCOUNTER — Inpatient Hospital Stay: Payer: Medicare Other

## 2021-01-31 ENCOUNTER — Inpatient Hospital Stay: Payer: Medicare Other | Admitting: Oncology

## 2021-02-15 ENCOUNTER — Inpatient Hospital Stay: Payer: Medicare Other | Attending: Oncology

## 2021-02-15 ENCOUNTER — Inpatient Hospital Stay (HOSPITAL_BASED_OUTPATIENT_CLINIC_OR_DEPARTMENT_OTHER): Payer: Medicare Other | Admitting: Oncology

## 2021-02-15 ENCOUNTER — Other Ambulatory Visit: Payer: Self-pay

## 2021-02-15 ENCOUNTER — Inpatient Hospital Stay: Payer: Medicare Other

## 2021-02-15 ENCOUNTER — Encounter: Payer: Self-pay | Admitting: Oncology

## 2021-02-15 VITALS — BP 135/76 | HR 80 | Resp 16 | Wt 174.9 lb

## 2021-02-15 DIAGNOSIS — R109 Unspecified abdominal pain: Secondary | ICD-10-CM | POA: Diagnosis not present

## 2021-02-15 DIAGNOSIS — E785 Hyperlipidemia, unspecified: Secondary | ICD-10-CM | POA: Insufficient documentation

## 2021-02-15 DIAGNOSIS — Z87891 Personal history of nicotine dependence: Secondary | ICD-10-CM | POA: Insufficient documentation

## 2021-02-15 DIAGNOSIS — D509 Iron deficiency anemia, unspecified: Secondary | ICD-10-CM | POA: Insufficient documentation

## 2021-02-15 DIAGNOSIS — K921 Melena: Secondary | ICD-10-CM | POA: Diagnosis not present

## 2021-02-15 DIAGNOSIS — Z85828 Personal history of other malignant neoplasm of skin: Secondary | ICD-10-CM | POA: Diagnosis not present

## 2021-02-15 DIAGNOSIS — J449 Chronic obstructive pulmonary disease, unspecified: Secondary | ICD-10-CM | POA: Diagnosis not present

## 2021-02-15 DIAGNOSIS — K219 Gastro-esophageal reflux disease without esophagitis: Secondary | ICD-10-CM | POA: Diagnosis not present

## 2021-02-15 DIAGNOSIS — R197 Diarrhea, unspecified: Secondary | ICD-10-CM | POA: Insufficient documentation

## 2021-02-15 DIAGNOSIS — Z79899 Other long term (current) drug therapy: Secondary | ICD-10-CM | POA: Diagnosis not present

## 2021-02-15 DIAGNOSIS — I10 Essential (primary) hypertension: Secondary | ICD-10-CM | POA: Diagnosis not present

## 2021-02-15 LAB — CBC WITH DIFFERENTIAL/PLATELET
Abs Immature Granulocytes: 0.02 10*3/uL (ref 0.00–0.07)
Basophils Absolute: 0.1 10*3/uL (ref 0.0–0.1)
Basophils Relative: 2 %
Eosinophils Absolute: 0.4 10*3/uL (ref 0.0–0.5)
Eosinophils Relative: 5 %
HCT: 35.5 % — ABNORMAL LOW (ref 36.0–46.0)
Hemoglobin: 11.5 g/dL — ABNORMAL LOW (ref 12.0–15.0)
Immature Granulocytes: 0 %
Lymphocytes Relative: 14 %
Lymphs Abs: 1.1 10*3/uL (ref 0.7–4.0)
MCH: 28.1 pg (ref 26.0–34.0)
MCHC: 32.4 g/dL (ref 30.0–36.0)
MCV: 86.8 fL (ref 80.0–100.0)
Monocytes Absolute: 1.1 10*3/uL — ABNORMAL HIGH (ref 0.1–1.0)
Monocytes Relative: 14 %
Neutro Abs: 5.2 10*3/uL (ref 1.7–7.7)
Neutrophils Relative %: 65 %
Platelets: 323 10*3/uL (ref 150–400)
RBC: 4.09 MIL/uL (ref 3.87–5.11)
RDW: 13.1 % (ref 11.5–15.5)
WBC: 7.9 10*3/uL (ref 4.0–10.5)
nRBC: 0 % (ref 0.0–0.2)

## 2021-02-15 LAB — FERRITIN: Ferritin: 17 ng/mL (ref 11–307)

## 2021-02-15 LAB — IRON AND TIBC
Iron: 27 ug/dL — ABNORMAL LOW (ref 28–170)
Saturation Ratios: 7 % — ABNORMAL LOW (ref 10.4–31.8)
TIBC: 385 ug/dL (ref 250–450)
UIBC: 358 ug/dL

## 2021-02-15 NOTE — Progress Notes (Signed)
Pt states she feels pretty good from iron situation, she does have arthritis all the time she says but doing good. He is drinking a boost protein drink usually 1 a day but not every day.  She has a great appetite. Bowels and bladder good. No depression. Has some neuropathy of feet and fingers and she says it is from arthritis

## 2021-02-15 NOTE — Progress Notes (Signed)
Hematology/Oncology Consult note [facility]  Telephone:([phone] [phone]  Patient Care Team: Kirk Ruths, MD as PCP - General (Internal Medicine) Sindy Guadeloupe, MD as Consulting Physician (Hematology and Oncology)   Name of the patient: [name] [name]  [id number]  1925-07-27   Date of visit: 02/15/21  Diagnosis- iron deficiency anemia  Chief complaint/ Reason for visit- routine f/u of iron deficiency anemia  Heme/Onc history: Patient is a 88 year old female who was referred to the clinic in October 2017 for chronic iron deficiency anemia with history of diverticulosis. She has been unable to tolerate oral iron supplements due to abdominal cramping and diarrhea. She has required several blood transfusions in the past and received regular IV iron infusions. She has tolerated these without complications. Iron and blood transfusions have been given on a palliative basis with a goal hemoglobin of >10.    March 2018 she underwent an EGD and colonoscopy which did not reveal source of bleeding.   May  2/18 - enteroscopy revealed 3 avms which were ablated. She suffered melena and had a hemoglobin of 6.7 and was hospitalized. At that time, enteroscopy was repeated which was normal. She received a blood transfusion and her hemoglobin was 9.6.    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    08/07/2016 Capsule study showed active bleeding from single spot in proximal jejunum. She underwent push enteroscopy with argon laser therapy but no bleeding was found  Interval history-presents with her son today.  States she is doing well.  Energy levels have maintained since receiving back in September.  No shortness of breath.  Notes occasional dark stools every few weeks.  ECOG PS- 1 Pain scale- 0  Review of systems- Review of Systems  Constitutional: Negative.  Negative for chills, fever,  malaise/fatigue and weight loss.  HENT:  Negative for congestion, ear pain and tinnitus.   Eyes: Negative.  Negative for blurred vision and double vision.  Respiratory: Negative.  Negative for cough, sputum production and shortness of breath.   Cardiovascular: Negative.  Negative for chest pain, palpitations and leg swelling.  Gastrointestinal: Negative.  Negative for abdominal pain, constipation, diarrhea, nausea and vomiting.         Dark stools on occasion  Genitourinary:  Negative for dysuria, frequency and urgency.  Musculoskeletal:  Negative for back pain and falls.  Skin: Negative.  Negative for rash.  Neurological: Negative.  Negative for weakness and headaches.  Endo/Heme/Allergies: Negative.  Does not bruise/bleed easily.  Psychiatric/Behavioral: Negative.  Negative for depression. The patient is not nervous/anxious and does not have insomnia.      Allergies  Allergen Reactions   Nsaids Hives   Dextrans Hives   Fentanyl Itching   Lipitor [Atorvastatin] Other (See Comments)    Reaction: Muscle pain   Lovastatin    Mobic [Meloxicam] Other (See Comments)    Reaction: Mouth and tongue ulcers   Niacin And Related Itching   Other    Polysaccharide K Hives   Pravachol [Pravastatin Sodium] Other (See Comments)    Reaction: Muscle pain   Statins Other (See Comments)   Tolmetin Hives   Voltaren [Diclofenac Sodium] Other (See Comments)    Reaction: Mouth and tongue ulcers   Zetia [Ezetimibe] Other (See Comments)    Reaction: Leg pain   Codeine Nausea And Vomiting   Niacin Itching     Past Medical History:  Diagnosis Date   Actinic  keratosis    Anemia    COPD (chronic obstructive pulmonary disease) (HCC)    Diverticulitis    GERD (gastroesophageal reflux disease)    Glaucoma    HLD (hyperlipidemia)    Hypertension    Iron deficiency    Rectal bleeding    Skin cancer    Nose, txted by Dr[facility] Valere Dross at [facility]     Past Surgical History:  Procedure Laterality Date    ABDOMINAL HYSTERECTOMY     APPENDECTOMY     COLONOSCOPY WITH PROPOFOL N/A 04/14/2016   Procedure: COLONOSCOPY WITH PROPOFOL;  Surgeon: Lucilla Lame, MD;  Location: [facility] ENDOSCOPY;  Service: Endoscopy;  Laterality: N/A;   ENTEROSCOPY N/A 05/10/2016   Procedure: Push ENTEROSCOPY with pediatric colonoscope;  Surgeon: [name], MD;  Location: [facility] ENDOSCOPY;  Service: Endoscopy;  Laterality: N/A;   ENTEROSCOPY N/A 05/30/2016   Procedure: push ENTEROSCOPY;  Surgeon: [name], MD;  Location: [facility] ENDOSCOPY;  Service: Endoscopy;  Laterality: N/A;   ENTEROSCOPY N/A 07/05/2017   Procedure: ENTEROSCOPY;  Surgeon: [name], MD;  Location: [facility] ENDOSCOPY;  Service: Gastroenterology;  Laterality: N/A;   ENTEROSCOPY N/A 06/21/2018   Procedure: ENTEROSCOPY;  Surgeon: [name], MD;  Location: [facility] ENDOSCOPY;  Service: Gastroenterology;  Laterality: N/A;   ESOPHAGOGASTRODUODENOSCOPY (EGD) WITH PROPOFOL N/A 08/12/2015   Procedure: ESOPHAGOGASTRODUODENOSCOPY (EGD) WITH PROPOFOL;  Surgeon: Lollie Sails, MD;  Location: [facility] ENDOSCOPY;  Service: Endoscopy;  Laterality: N/A;   ESOPHAGOGASTRODUODENOSCOPY (EGD) WITH PROPOFOL N/A 04/03/2016   Procedure: ESOPHAGOGASTRODUODENOSCOPY (EGD) WITH PROPOFOL;  Surgeon: Lucilla Lame, MD;  Location: [facility] ENDOSCOPY;  Service: Endoscopy;  Laterality: N/A;   GIVENS CAPSULE STUDY N/A 04/28/2016   Procedure: GIVENS CAPSULE STUDY;  Surgeon: [name], MD;  Location: [facility] ENDOSCOPY;  Service: Endoscopy;  Laterality: N/A;   GIVENS CAPSULE STUDY N/A 06/21/2017   Procedure: GIVENS CAPSULE STUDY;  Surgeon: [name], MD;  Location: [facility] ENDOSCOPY;  Service: Gastroenterology;  Laterality: N/A;   JOINT REPLACEMENT      Social History   Socioeconomic History   Marital status: Widowed    Spouse name: Not on file   Number of children: Not on file   Years of education: Not on file   Highest education level: Not on file  Occupational History   Not on file  Tobacco Use   Smoking  status: Former    Packs/day: 2.00    Years: 20.00    Pack years: 40.00    Types: Cigarettes    Quit date: 04/22/1979    Years since quitting: 41.8   Smokeless tobacco: Never  Vaping Use   Vaping Use: Never used  Substance and Sexual Activity   Alcohol use: Not Currently    Alcohol/week: 1.0 standard drink    Types: 1 Glasses of wine per week   Drug use: No   Sexual activity: Not Currently    Birth control/protection: Post-menopausal  Other Topics Concern   Not on file  Social History Narrative   Not on file   Social Determinants of Health   Financial Resource Strain: Not on file  Food Insecurity: Not on file  Transportation Needs: Not on file  Physical Activity: Not on file  Stress: Not on file  Social Connections: Not on file  Intimate Partner Violence: Not on file    Family History  Problem Relation Age of Onset   CAD Mother    CAD Father    CAD Brother      Current Outpatient Medications:    acetaminophen (TYLENOL)  500 MG tablet, Take 500-1,000 mg by mouth every 6 (six) hours as needed for mild pain, moderate pain or fever., Disp: , Rfl:    acidophilus (RISAQUAD) CAPS capsule, Take 1 capsule by mouth daily. (Patient taking differently: Take 1 capsule by mouth in the morning and at bedtime.), Disp: 14 capsule, Rfl: 0   Cyanocobalamin (B-12) 1000 MCG CAPS, Take 2,000 Units by mouth daily. , Disp: , Rfl:    escitalopram (LEXAPRO) 20 MG tablet, Take 20 mg by mouth daily., Disp: , Rfl:    gabapentin (NEURONTIN) 300 MG capsule, Take 300 mg by mouth 2 (two) times daily., Disp: , Rfl:    hydrochlorothiazide (HYDRODIURIL) 12.5 MG tablet, Take 12.5 mg by mouth daily., Disp: , Rfl:    ipratropium-albuterol (DUONEB) 0.5-2.5 (3) MG/3ML SOLN, Inhale 3 mLs into the lungs 2 (two) times daily., Disp: , Rfl:    latanoprost (XALATAN) 0.005 % ophthalmic solution, Place 1 drop into both eyes at bedtime., Disp: , Rfl:    losartan (COZAAR) 50 MG tablet, Take 50 mg by mouth daily., Disp:  , Rfl:    mometasone (ELOCON) 0.1 % lotion, Apply 1 application topically daily as needed (skin irritation)., Disp: , Rfl:    Multiple Vitamins-Minerals (OCUVITE PRESERVISION PO), Take 1 tablet by mouth 2 (two) times daily., Disp: , Rfl:    potassium chloride (KLOR-CON) 10 MEQ tablet, Take 10 mEq by mouth 3 (three) times daily., Disp: , Rfl:    RABEprazole (ACIPHEX) 20 MG tablet, Take 20 mg by mouth 2 (two) times daily., Disp: , Rfl:  No current facility-administered medications for this visit.  Facility-Administered Medications Ordered in Other Visits:    0.9 %  sodium chloride infusion, , Intravenous, Continuous, Sindy Guadeloupe, MD, Last Rate: 0 mL/hr at 10/08/17 1518, New Bag at 06/21/18 0952  Physical exam:  Vitals:   02/15/21 1435  BP: 135/76  Pulse: 80  Resp: 16  TempSrc: Tympanic  Weight: 174 lb 14.4 oz (79.3 kg)   Physical Exam Constitutional:      Appearance: Normal appearance.  HENT:     Head: Normocephalic and atraumatic.  Eyes:     Pupils: Pupils are equal, round, and reactive to light.  Cardiovascular:     Rate and Rhythm: Normal rate and regular rhythm.     Heart sounds: Normal heart sounds. No murmur heard. Pulmonary:     Effort: Pulmonary effort is normal.     Breath sounds: Normal breath sounds. No wheezing.  Abdominal:     General: Bowel sounds are normal. There is no distension.     Palpations: Abdomen is soft.     Tenderness: There is no abdominal tenderness.  Musculoskeletal:        General: Normal range of motion.     Cervical back: Normal range of motion.  Skin:    General: Skin is warm and dry.     Findings: No rash.  Neurological:     Mental Status: She is alert and oriented to person, place, and time.  Psychiatric:        Judgment: Judgment normal.     CMP Latest Ref Rng & Units 11/07/2019  Glucose 70 - 99 mg/dL 103(H)  BUN 8 - 23 mg/dL 19  Creatinine 0.44 - 1.00 mg/dL 0.59  Sodium 135 - 145 mmol/L 131(L)  Potassium 3.5 - 5.1 mmol/L 3.8   Chloride 98 - 111 mmol/L 97(L)  CO2 22 - 32 mmol/L 25  Calcium 8.9 - 10.3 mg/dL 8.6(L)  Total  Protein 6.5 - 8.1 g/dL -  Total Bilirubin 0.3 - 1.2 mg/dL -  Alkaline Phos 38 - 126 U/L -  AST 15 - 41 U/L -  ALT 0 - 44 U/L -   CBC Latest Ref Rng & Units 02/15/2021  WBC 4.0 - 10.5 K/uL 7.9  Hemoglobin 12.0 - 15.0 g/dL 11.5(L)  Hematocrit 36.0 - 46.0 % 35.5(L)  Platelets 150 - 400 K/uL 323     Assessment and plan-Mrs. Mancebo is a 88 year old female who presents for follow-up for her iron deficiency anemia.    Clinically she is doing well.  She last received iron on 10/05/2020 and on 10/08/2020.  Has noted some dark stools every few weeks.  Not taking oral iron supplements.  Energy levels have improved and have stabilized.  Has had extensive work-up in the past including EGD, colonoscopy and capsule study with a double-balloon enteroscopy without evidence of bleeding.  Given her age, they opted to forego any additional investigation at this time.  Labs from today show hemoglobin of 11.4 and MCV of 86.8.  No additional IV iron needed at this time.  Iron levels are pending.  Recommend she return to clinic in 4 months for follow-up with Dr. Janese Banks with lab work and possible IV iron.  Instructed her to return to clinic if she developed any worrisome symptoms such as fatigue, weakness or shortness of breath.  I spent 20 minutes dedicated to the care of this patient (face-to-face and non-face-to-face) on the date of the encounter to include what is described in the assessment and plan.   Visit Diagnosis 1. Iron deficiency anemia, unspecified iron deficiency anemia type     Faythe Casa, NP 02/15/2021 2:52 PM 2:52 PM

## 2021-02-23 ENCOUNTER — Inpatient Hospital Stay: Payer: Medicare Other

## 2021-05-19 ENCOUNTER — Other Ambulatory Visit: Payer: Self-pay

## 2021-05-19 ENCOUNTER — Emergency Department: Payer: Medicare Other

## 2021-05-19 ENCOUNTER — Emergency Department
Admission: EM | Admit: 2021-05-19 | Discharge: 2021-05-19 | Disposition: A | Discharge status: Home / Self Care | Payer: Medicare Other | Attending: Emergency Medicine | Admitting: Emergency Medicine

## 2021-05-19 ENCOUNTER — Encounter: Payer: Self-pay | Admitting: Intensive Care

## 2021-05-19 DIAGNOSIS — R42 Dizziness and giddiness: Secondary | ICD-10-CM | POA: Diagnosis not present

## 2021-05-19 DIAGNOSIS — R110 Nausea: Secondary | ICD-10-CM | POA: Diagnosis present

## 2021-05-19 DIAGNOSIS — K5792 Diverticulitis of intestine, part unspecified, without perforation or abscess without bleeding: Secondary | ICD-10-CM | POA: Insufficient documentation

## 2021-05-19 DIAGNOSIS — J449 Chronic obstructive pulmonary disease, unspecified: Secondary | ICD-10-CM | POA: Insufficient documentation

## 2021-05-19 LAB — CBC
HCT: 36.4 % (ref 36.0–46.0)
Hemoglobin: 11.4 g/dL — ABNORMAL LOW (ref 12.0–15.0)
MCH: 24.6 pg — ABNORMAL LOW (ref 26.0–34.0)
MCHC: 31.3 g/dL (ref 30.0–36.0)
MCV: 78.4 fL — ABNORMAL LOW (ref 80.0–100.0)
Platelets: 358 10*3/uL (ref 150–400)
RBC: 4.64 MIL/uL (ref 3.87–5.11)
RDW: 15.3 % (ref 11.5–15.5)
WBC: 7.7 10*3/uL (ref 4.0–10.5)
nRBC: 0 % (ref 0.0–0.2)

## 2021-05-19 LAB — COMPREHENSIVE METABOLIC PANEL
ALT: 12 U/L (ref 0–44)
AST: 22 U/L (ref 15–41)
Albumin: 4.1 g/dL (ref 3.5–5.0)
Alkaline Phosphatase: 53 U/L (ref 38–126)
Anion gap: 9 (ref 5–15)
BUN: 14 mg/dL (ref 8–23)
CO2: 26 mmol/L (ref 22–32)
Calcium: 9.5 mg/dL (ref 8.9–10.3)
Chloride: 99 mmol/L (ref 98–111)
Creatinine, Ser: 0.61 mg/dL (ref 0.44–1.00)
GFR, Estimated: >60 mL/min (ref >60)
Glucose, Bld: 104 mg/dL — ABNORMAL HIGH (ref 70–99)
Potassium: 3.7 mmol/L (ref 3.5–5.1)
Sodium: 134 mmol/L — ABNORMAL LOW (ref 135–145)
Total Bilirubin: 0.5 mg/dL (ref 0.3–1.2)
Total Protein: 7.8 g/dL (ref 6.5–8.1)

## 2021-05-19 LAB — URINALYSIS, ROUTINE W REFLEX MICROSCOPIC
Bilirubin Urine: NEGATIVE
Glucose, UA: NEGATIVE mg/dL
Hgb urine dipstick: NEGATIVE
Ketones, ur: 5 mg/dL — AB
Leukocytes,Ua: NEGATIVE
Nitrite: NEGATIVE
Protein, ur: NEGATIVE mg/dL
Specific Gravity, Urine: 1.019 (ref 1.005–1.030)
pH: 8 (ref 5.0–8.0)

## 2021-05-19 LAB — LIPASE, BLOOD: Lipase: 35 U/L (ref 11–51)

## 2021-05-19 LAB — TROPONIN I (HIGH SENSITIVITY): Troponin I (High Sensitivity): 5 ng/L (ref <18)

## 2021-05-19 MED ORDER — IOHEXOL 350 MG/ML SOLN
75.0000 mL | Freq: Once | INTRAVENOUS | Status: AC | PRN
  Administered 2021-05-19: 75 mL via INTRAVENOUS

## 2021-05-19 MED ORDER — ONDANSETRON 4 MG PO TBDP: 4.0000 mg | ORAL_TABLET | Freq: Three times a day (TID) | ORAL | 0 refills | Status: AC | PRN

## 2021-05-19 MED ORDER — SODIUM CHLORIDE 0.9 % IV BOLUS
500.0000 mL | Freq: Once | INTRAVENOUS | Status: AC
  Administered 2021-05-19: 500 mL via INTRAVENOUS

## 2021-05-19 MED ORDER — ONDANSETRON HCL 4 MG/2ML IJ SOLN
4.0000 mg | Freq: Once | INTRAMUSCULAR | Status: AC
  Administered 2021-05-19: 4 mg via INTRAVENOUS
  Filled 2021-05-19: qty 2

## 2021-05-19 NOTE — ED Provider Notes (Signed)
----------------------------------------- ?  5:22 PM on 05/19/2021 ?----------------------------------------- ?Repeat troponin is negative.  Urinalysis is clean with no signs of infection.  We will discharge the patient home with antibiotics.  Patient and family agreeable to plan of care. ?  ?Harvest Dark, MD ?05/19/21 1722 ? ?

## 2021-05-19 NOTE — ED Triage Notes (Addendum)
Patient c/o nausea, abdominal aches, and dizziness. Reports having root canal last Friday 05/13/21 and placed on amoxicillin. Reports by Monday she was having these symptoms. C/o being dehydrated and not urinating as much. Patient reports she is hard of hearing and legally blind.  ?

## 2021-05-19 NOTE — ED Notes (Signed)
Patient at CT scan.

## 2021-05-19 NOTE — ED Provider Notes (Addendum)
? ?[facility] ?Provider Note ? ? ? Event Date/Time  ? First MD Initiated Contact with Patient 05/19/21 1403   ?  (approximate) ? ? ?History  ? ?Abdominal Pain, Nausea, and Dizziness ? ? ?HPI ? ?[name] [name] is a 88 y.o. female with COPD who comes in with nausea, abdominal aches and dizziness.  Patient is hard of hearing and legally blind.  Patient reports since Monday having abdominal bloating, discomfort with associated nausea.  She reports feeling dizzy headed and feeling like she is is dizzy more with movements than at rest.  She denies any chest pain, shortness of breath.  She does report prior appendectomy.  She reports complete the course of antibiotics for amoxicillin but still having the discomfort in her abdomen.  She denies any diarrhea. ? ?I reviewed the [facility] clinic note from today where patient had amoxicillin for recent root canal and been having nausea and emesis.  Patient reports very hard bowel movement this morning ? ? ?Physical Exam  ? ?Triage Vital Signs: ?ED Triage Vitals  ?Enc Vitals Group  ?   BP 05/19/21 1247 (!) 178/62  ?   Pulse Rate 05/19/21 1247 73  ?   Resp 05/19/21 1247 18  ?   Temp 05/19/21 1247 98.5 ?F (36.9 ?C)  ?   Temp Source 05/19/21 1247 Oral  ?   SpO2 05/19/21 1247 95 %  ?   Weight 05/19/21 1246 172 lb (78 kg)  ?   Height 05/19/21 1246 '5\' 7"'$  (1.702 m)  ?   Head Circumference --   ?   Peak Flow --   ?   Pain Score 05/19/21 1255 0  ?   Pain Loc --   ?   Pain Edu? --   ?   Excl. in [place]? --   ? ? ?Most recent vital signs: ?Vitals:  ? 05/19/21 1247 05/19/21 1356  ?BP: (!) 178/62 (!) 178/72  ?Pulse: 73 62  ?Resp: 18 18  ?Temp: 98.5 ?F (36.9 ?C)   ?SpO2: 95% 100%  ? ? ? ?General: Awake, no distress.  ?CV:  Good peripheral perfusion.  ?Resp:  Normal effort.  ?Abd:  No distention.  Patient reports tenderness throughout her abdomen ?Other:  Cranial nerves appear intact.  Finger-to-nose intact bilaterally ? ? ?ED Results / Procedures / Treatments   ? ?Labs ?(all labs ordered are listed, but only abnormal results are displayed) ?Labs Reviewed  ?COMPREHENSIVE METABOLIC PANEL - Abnormal; Notable for the following components:  ?    Result Value  ? Sodium 134 (*)   ? Glucose, Bld 104 (*)   ? All other components within normal limits  ?CBC - Abnormal; Notable for the following components:  ? Hemoglobin 11.4 (*)   ? MCV 78.4 (*)   ? MCH 24.6 (*)   ? All other components within normal limits  ?LIPASE, BLOOD  ?URINALYSIS, ROUTINE W REFLEX MICROSCOPIC  ? ? ? ?EKG ? ?My interpretation of EKG: ? ?Normal sinus rate of 65 without any ST elevation or T wave inversions, normal intervals ? ?RADIOLOGY ?CT head personally reviewed without any evidence of intracranial hemorrhage ? ? ?PROCEDURES: ? ?Critical Care performed: No ? ?.1-3 Lead EKG Interpretation ?Performed by: Vanessa Coatsburg, MD ?Authorized by: Vanessa Ralston, MD  ? ?  Interpretation: normal   ?  ECG rate:  60 ?  ECG rate assessment: normal   ?  Rhythm: sinus rhythm   ?  Ectopy: none   ?  Conduction: normal   ? ? ?MEDICATIONS ORDERED IN ED: ?Medications - No data to display ? ? ?IMPRESSION / MDM / ASSESSMENT AND PLAN / ED COURSE  ?I reviewed the triage vital signs and the nursing notes. ? ?Patient comes in with dizziness, dehydration, bloating.  Patient given some IV fluids, IV Zofran.  Will get CT imaging evaluate for any, intracranial hemorrhage, mass however her dizziness seems to be more positional in nature as I suspect more likely dehydration.  Labs ordered evaluate for Electra abnormalities.  CT abdomen evaluate for any obstruction. ? ?CMP slightly low sodium.  Lipase normal.  CBC stable hemoglobin ? ? ?Patient handed off pending CT, troponin ? ?The patient is on the cardiac monitor to evaluate for evidence of arrhythmia and/or significant heart rate changes. ? ?  ?IMPRESSION: ?1. Probable acute uncomplicated sigmoid diverticulitis/colitis, ?evaluation of which is somewhat limited by streak artifact from ?right  hip arthroplasty. ?2. Small hiatal hernia. ?3.  Aortic Atherosclerosis (ICD10-I70.0). ? ?CT head negative ? ?Discussed with family the mild diverticulitis and that typical treatment is Augmentin or Cipro Flagyl.  Patient reports that her symptoms all started up after being on Amoxil  No diarrhea to suggest C. difficile at this time.  No blood in stool and no pain out of proportion to suggest ischemic. We discussed antibiotic use and they would like to hold off on antibiotics given afebrile normal white count- plan to do diet modifications, Zofran, Tylenol and monitoring symptoms at home and will return if symptoms are worsening. ? ?Handed off pending trop, UA, amb trial.  ? ?  ? ? ?FINAL CLINICAL IMPRESSION(S) / ED DIAGNOSES  ? ?Final diagnoses:  ?Nausea  ?Diverticulitis  ? ? ? ?Rx / DC Orders  ? ?ED Discharge Orders   ? ? None  ? ?  ? ? ? ?Note:  This document was prepared using Dragon voice recognition software and may include unintentional dictation errors. ?  ?Vanessa Fairplay, MD ?05/19/21 1427 ? ?  ?Vanessa Wainwright, MD ?05/19/21 1531 ? ?

## 2021-05-19 NOTE — Discharge Instructions (Addendum)
Liquid diet and bowel rest (low fiber foods) are most important ?We discussed antibiotics but given her history of not tolerating the amoxicillin we have opted to hold off given his uncomplicated diverticulitis and new studies state that we can hold off on select patients.  Instead ?Liquid diet and bowel rest (low fiber foods) are most important, Tylenol 1 g every 8 hours for pain and return to the ER if she develops worsening symptoms ? ?IMPRESSION: ?1. Probable acute uncomplicated sigmoid diverticulitis/colitis, ?evaluation of which is somewhat limited by streak artifact from ?right hip arthroplasty. ?2. Small hiatal hernia. ?3.  Aortic Atherosclerosis (ICD10-I70.0). ?  ?

## 2021-06-15 ENCOUNTER — Ambulatory Visit: Payer: Medicare Other | Admitting: Oncology

## 2021-06-15 ENCOUNTER — Other Ambulatory Visit: Payer: Medicare Other

## 2021-06-15 ENCOUNTER — Ambulatory Visit: Payer: Medicare Other

## 2021-06-21 ENCOUNTER — Other Ambulatory Visit: Payer: Self-pay | Admitting: Oncology

## 2021-06-22 ENCOUNTER — Inpatient Hospital Stay: Payer: Medicare Other

## 2021-06-22 ENCOUNTER — Other Ambulatory Visit: Payer: Self-pay

## 2021-06-22 ENCOUNTER — Encounter: Payer: Self-pay | Admitting: Oncology

## 2021-06-22 ENCOUNTER — Inpatient Hospital Stay: Payer: Medicare Other | Attending: Oncology | Admitting: Oncology

## 2021-06-22 VITALS — BP 140/58 | HR 65 | Temp 98.2°F | Resp 16 | Wt 167.2 lb

## 2021-06-22 VITALS — BP 172/65 | HR 65

## 2021-06-22 DIAGNOSIS — D509 Iron deficiency anemia, unspecified: Secondary | ICD-10-CM

## 2021-06-22 DIAGNOSIS — D500 Iron deficiency anemia secondary to blood loss (chronic): Secondary | ICD-10-CM

## 2021-06-22 LAB — IRON AND TIBC
Iron: 31 ug/dL (ref 28–170)
Saturation Ratios: 8 % — ABNORMAL LOW (ref 10.4–31.8)
TIBC: 412 ug/dL (ref 250–450)
UIBC: 381 ug/dL

## 2021-06-22 LAB — CBC WITH DIFFERENTIAL/PLATELET
Abs Immature Granulocytes: 0.02 10*3/uL (ref 0.00–0.07)
Basophils Absolute: 0.1 10*3/uL (ref 0.0–0.1)
Basophils Relative: 2 %
Eosinophils Absolute: 0.3 10*3/uL (ref 0.0–0.5)
Eosinophils Relative: 4 %
HCT: 34.7 % — ABNORMAL LOW (ref 36.0–46.0)
Hemoglobin: 11.1 g/dL — ABNORMAL LOW (ref 12.0–15.0)
Immature Granulocytes: 0 %
Lymphocytes Relative: 11 %
Lymphs Abs: 0.9 10*3/uL (ref 0.7–4.0)
MCH: 25 pg — ABNORMAL LOW (ref 26.0–34.0)
MCHC: 32 g/dL (ref 30.0–36.0)
MCV: 78.2 fL — ABNORMAL LOW (ref 80.0–100.0)
Monocytes Absolute: 1.1 10*3/uL — ABNORMAL HIGH (ref 0.1–1.0)
Monocytes Relative: 14 %
Neutro Abs: 5.3 10*3/uL (ref 1.7–7.7)
Neutrophils Relative %: 69 %
Platelets: 321 10*3/uL (ref 150–400)
RBC: 4.44 MIL/uL (ref 3.87–5.11)
RDW: 15.9 % — ABNORMAL HIGH (ref 11.5–15.5)
WBC: 7.7 10*3/uL (ref 4.0–10.5)
nRBC: 0 % (ref 0.0–0.2)

## 2021-06-22 LAB — COMPREHENSIVE METABOLIC PANEL
ALT: 11 U/L (ref 0–44)
AST: 20 U/L (ref 15–41)
Albumin: 3.8 g/dL (ref 3.5–5.0)
Alkaline Phosphatase: 53 U/L (ref 38–126)
Anion gap: 7 (ref 5–15)
BUN: 18 mg/dL (ref 8–23)
CO2: 27 mmol/L (ref 22–32)
Calcium: 8.8 mg/dL — ABNORMAL LOW (ref 8.9–10.3)
Chloride: 97 mmol/L — ABNORMAL LOW (ref 98–111)
Creatinine, Ser: 0.69 mg/dL (ref 0.44–1.00)
GFR, Estimated: >60 mL/min (ref >60)
Glucose, Bld: 116 mg/dL — ABNORMAL HIGH (ref 70–99)
Potassium: 4 mmol/L (ref 3.5–5.1)
Sodium: 131 mmol/L — ABNORMAL LOW (ref 135–145)
Total Bilirubin: 0.4 mg/dL (ref 0.3–1.2)
Total Protein: 7.3 g/dL (ref 6.5–8.1)

## 2021-06-22 LAB — FERRITIN: Ferritin: 8 ng/mL — ABNORMAL LOW (ref 11–307)

## 2021-06-22 MED ORDER — SODIUM CHLORIDE 0.9 % IV SOLN
Freq: Once | INTRAVENOUS | Status: AC
  Filled 2021-06-22: qty 250

## 2021-06-22 MED ORDER — SODIUM CHLORIDE 0.9 % IV SOLN
300.0000 mg | INTRAVENOUS | Status: DC
  Administered 2021-06-22: 300 mg via INTRAVENOUS
  Filled 2021-06-22: qty 15

## 2021-06-22 NOTE — Patient Instructions (Signed)

## 2021-06-22 NOTE — Progress Notes (Signed)
Hematology/Oncology Consult note [facility]  Telephone:([phone] [phone]  Patient Care Team: Kirk Ruths, MD as PCP - General (Internal Medicine) Sindy Guadeloupe, MD as Consulting Physician (Hematology and Oncology)   Name of the patient: [name] [name]  [id number]  01-10-1926   Date of visit: 06/22/21  Diagnosis-  iron deficiency anemia  Chief complaint/ Reason for visit- routine f/u of iron deficiency anemia  Heme/Onc history: Patient is a 88 year old female who was referred to the clinic in October 2017 for chronic iron deficiency anemia with history of diverticulosis. She has been unable to tolerate oral iron supplements due to abdominal cramping and diarrhea. She has required several blood transfusions in the past and received regular IV iron infusions. She has tolerated these without complications. Iron and blood transfusions have been given on a palliative basis with a goal hemoglobin of >10.    March 2018 she underwent an EGD and colonoscopy which did not reveal source of bleeding.   May  2/18 - enteroscopy revealed 3 avms which were ablated. She suffered melena and had a hemoglobin of 6.7 and was hospitalized. At that time, enteroscopy was repeated which was normal. She received a blood transfusion and her hemoglobin was 9.6.    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    08/07/2016 Capsule study showed active bleeding from single spot in proximal jejunum. She underwent push enteroscopy with argon laser therapy but no bleeding was found    Interval history-patient was in the ER in May 2023 for symptoms of abdominal pain and diarrhea and was found to have diverticulitis which was treated with oral antibiotics.  Presently she feels better.  She denies any blood loss in her stool or urine.  She lives alone and is independent of her ADLs.  Vision is diminishing.  She has  someone to come to her house to clean and cook.  ECOG PS- 2 Pain scale- 0   Review of systems- Review of Systems  Constitutional:  Positive for malaise/fatigue. Negative for chills, fever and weight loss.  HENT:  Negative for congestion, ear discharge and nosebleeds.   Eyes:  Negative for blurred vision.  Respiratory:  Negative for cough, hemoptysis, sputum production, shortness of breath and wheezing.   Cardiovascular:  Negative for chest pain, palpitations, orthopnea and claudication.  Gastrointestinal:  Negative for abdominal pain, blood in stool, constipation, diarrhea, heartburn, melena, nausea and vomiting.  Genitourinary:  Negative for dysuria, flank pain, frequency, hematuria and urgency.  Musculoskeletal:  Negative for back pain, joint pain and myalgias.  Skin:  Negative for rash.  Neurological:  Negative for dizziness, tingling, focal weakness, seizures, weakness and headaches.  Endo/Heme/Allergies:  Does not bruise/bleed easily.  Psychiatric/Behavioral:  Negative for depression and suicidal ideas. The patient does not have insomnia.       Allergies  Allergen Reactions   Nsaids Hives   Dextrans Hives   Fentanyl Itching   Lipitor [Atorvastatin] Other (See Comments)    Reaction: Muscle pain   Lovastatin    Mobic [Meloxicam] Other (See Comments)    Reaction: Mouth and tongue ulcers   Niacin And Related Itching   Other    Polysaccharide K Hives   Pravachol [Pravastatin Sodium] Other (See Comments)    Reaction: Muscle pain   Statins Other (See Comments)   Tolmetin Hives   Voltaren [Diclofenac Sodium] Other (See Comments)    Reaction: Mouth and tongue ulcers  Zetia [Ezetimibe] Other (See Comments)    Reaction: Leg pain   Codeine Nausea And Vomiting   Niacin Itching     Past Medical History:  Diagnosis Date   Actinic keratosis    Anemia    COPD (chronic obstructive pulmonary disease) (HCC)    Diverticulitis    GERD (gastroesophageal reflux disease)     Glaucoma    HLD (hyperlipidemia)    Hypertension    Iron deficiency    Rectal bleeding    Skin cancer    Nose, txted by Dr. Valere Dross at [facility]     Past Surgical History:  Procedure Laterality Date   ABDOMINAL HYSTERECTOMY     APPENDECTOMY     COLONOSCOPY WITH PROPOFOL N/A 04/14/2016   Procedure: COLONOSCOPY WITH PROPOFOL;  Surgeon: Lucilla Lame, MD;  Location: [facility] ENDOSCOPY;  Service: Endoscopy;  Laterality: N/A;   ENTEROSCOPY N/A 05/10/2016   Procedure: Push ENTEROSCOPY with pediatric colonoscope;  Surgeon: [name], MD;  Location: [facility] ENDOSCOPY;  Service: Endoscopy;  Laterality: N/A;   ENTEROSCOPY N/A 05/30/2016   Procedure: push ENTEROSCOPY;  Surgeon: [name], MD;  Location: [facility] ENDOSCOPY;  Service: Endoscopy;  Laterality: N/A;   ENTEROSCOPY N/A 07/05/2017   Procedure: ENTEROSCOPY;  Surgeon: [name], MD;  Location: [facility] ENDOSCOPY;  Service: Gastroenterology;  Laterality: N/A;   ENTEROSCOPY N/A 06/21/2018   Procedure: ENTEROSCOPY;  Surgeon: [name], MD;  Location: [facility] ENDOSCOPY;  Service: Gastroenterology;  Laterality: N/A;   ESOPHAGOGASTRODUODENOSCOPY (EGD) WITH PROPOFOL N/A 08/12/2015   Procedure: ESOPHAGOGASTRODUODENOSCOPY (EGD) WITH PROPOFOL;  Surgeon: Lollie Sails, MD;  Location: [facility] ENDOSCOPY;  Service: Endoscopy;  Laterality: N/A;   ESOPHAGOGASTRODUODENOSCOPY (EGD) WITH PROPOFOL N/A 04/03/2016   Procedure: ESOPHAGOGASTRODUODENOSCOPY (EGD) WITH PROPOFOL;  Surgeon: Lucilla Lame, MD;  Location: [facility] ENDOSCOPY;  Service: Endoscopy;  Laterality: N/A;   GIVENS CAPSULE STUDY N/A 04/28/2016   Procedure: GIVENS CAPSULE STUDY;  Surgeon: [name], MD;  Location: [facility] ENDOSCOPY;  Service: Endoscopy;  Laterality: N/A;   GIVENS CAPSULE STUDY N/A 06/21/2017   Procedure: GIVENS CAPSULE STUDY;  Surgeon: [name], MD;  Location: [facility] ENDOSCOPY;  Service: Gastroenterology;  Laterality: N/A;   JOINT REPLACEMENT      Social History   Socioeconomic History   Marital status:  Widowed    Spouse name: Not on file   Number of children: Not on file   Years of education: Not on file   Highest education level: Not on file  Occupational History   Not on file  Tobacco Use   Smoking status: Former    Packs/day: 2.00    Years: 20.00    Total pack years: 40.00    Types: Cigarettes    Quit date: 04/22/1979    Years since quitting: 42.1   Smokeless tobacco: Never  Vaping Use   Vaping Use: Never used  Substance and Sexual Activity   Alcohol use: Not Currently    Alcohol/week: 1.0 standard drink of alcohol    Types: 1 Glasses of wine per week   Drug use: No   Sexual activity: Not Currently    Birth control/protection: Post-menopausal  Other Topics Concern   Not on file  Social History Narrative   Not on file   Social Determinants of Health   Financial Resource Strain: Not on file  Food Insecurity: Not on file  Transportation Needs: Not on file  Physical Activity: Not on file  Stress: Not on file  Social Connections: Not on file  Intimate Partner Violence: Not on file  Family History  Problem Relation Age of Onset   CAD Mother    CAD Father    CAD Brother      Current Outpatient Medications:    acetaminophen (TYLENOL) 500 MG tablet, Take 500-1,000 mg by mouth every 6 (six) hours as needed for mild pain, moderate pain or fever., Disp: , Rfl:    acidophilus (RISAQUAD) CAPS capsule, Take 1 capsule by mouth daily. (Patient taking differently: Take 1 capsule by mouth in the morning and at bedtime.), Disp: 14 capsule, Rfl: 0   Cyanocobalamin (B-12) 1000 MCG CAPS, Take 2,000 Units by mouth daily. , Disp: , Rfl:    escitalopram (LEXAPRO) 20 MG tablet, Take 20 mg by mouth daily., Disp: , Rfl:    gabapentin (NEURONTIN) 300 MG capsule, Take 300 mg by mouth 2 (two) times daily., Disp: , Rfl:    hydrochlorothiazide (HYDRODIURIL) 12.5 MG tablet, Take 12.5 mg by mouth daily., Disp: , Rfl:    latanoprost (XALATAN) 0.005 % ophthalmic solution, Place 1 drop into  both eyes at bedtime., Disp: , Rfl:    losartan (COZAAR) 50 MG tablet, Take 50 mg by mouth daily., Disp: , Rfl:    mometasone (ELOCON) 0.1 % lotion, Apply 1 application topically daily as needed (skin irritation)., Disp: , Rfl:    Multiple Vitamins-Minerals (OCUVITE PRESERVISION PO), Take 1 tablet by mouth 2 (two) times daily., Disp: , Rfl:    potassium chloride (KLOR-CON) 10 MEQ tablet, Take 10 mEq by mouth 3 (three) times daily., Disp: , Rfl:    RABEprazole (ACIPHEX) 20 MG tablet, Take 20 mg by mouth 2 (two) times daily., Disp: , Rfl:    ipratropium-albuterol (DUONEB) 0.5-2.5 (3) MG/3ML SOLN, Inhale 3 mLs into the lungs 2 (two) times daily., Disp: , Rfl:  No current facility-administered medications for this visit.  Facility-Administered Medications Ordered in Other Visits:    0.9 %  sodium chloride infusion, , Intravenous, Continuous, Sindy Guadeloupe, MD, Last Rate: 0 mL/hr at 10/08/17 1518, New Bag at 06/21/18 0952   iron sucrose (VENOFER) 300 mg in sodium chloride 0.9 % 250 mL IVPB, 300 mg, Intravenous, Weekly, Sindy Guadeloupe, MD, Last Rate: 176.7 mL/hr at 06/22/21 1357, 300 mg at 06/22/21 1357  Physical exam:  Vitals:   06/22/21 1310  BP: (!) 140/58  Pulse: 65  Resp: 16  Temp: 98.2 F (36.8 C)  SpO2: 98%  Weight: 167 lb 3.2 oz (75.8 kg)   Physical Exam Constitutional:      Comments: Sitting in a wheelchair.  Appears in no acute distress  Cardiovascular:     Rate and Rhythm: Normal rate and regular rhythm.     Heart sounds: Normal heart sounds.  Pulmonary:     Effort: Pulmonary effort is normal.     Breath sounds: Normal breath sounds.  Abdominal:     General: Bowel sounds are normal.     Palpations: Abdomen is soft.  Skin:    General: Skin is warm and dry.  Neurological:     Mental Status: She is alert and oriented to person, place, and time.         Latest Ref Rng & Units 06/22/2021   12:51 PM  CMP  Glucose 70 - 99 mg/dL 116   BUN 8 - 23 mg/dL 18   Creatinine  0.44 - 1.00 mg/dL 0.69   Sodium 135 - 145 mmol/L 131   Potassium 3.5 - 5.1 mmol/L 4.0   Chloride 98 - 111 mmol/L 97   CO2  22 - 32 mmol/L 27   Calcium 8.9 - 10.3 mg/dL 8.8   Total Protein 6.5 - 8.1 g/dL 7.3   Total Bilirubin 0.3 - 1.2 mg/dL 0.4   Alkaline Phos 38 - 126 U/L 53   AST 15 - 41 U/L 20   ALT 0 - 44 U/L 11       Latest Ref Rng & Units 06/22/2021   12:51 PM  CBC  WBC 4.0 - 10.5 K/uL 7.7   Hemoglobin 12.0 - 15.0 g/dL 11.1   Hematocrit 36.0 - 46.0 % 34.7   Platelets 150 - 400 K/uL 321    Assessment and plan- Patient is a 88 y.o. female with iron deficiency anemia here for routine follow-up  Hemoglobin is at 11 which is at her baseline.  But she is has evidence of microcytosis.  Iron studies from today are pending.Levels from February 2023 were continuing to show iron deficiency with a ferritin level of 17 and iron saturation of 7%.  I am therefore proceeding with 3 doses of Venofer 300 mg IV.  Repeat CBC ferritin and iron studies in 3 in 6 months and I will see her back in 6 months   Visit Diagnosis 1. Iron deficiency anemia, unspecified iron deficiency anemia type      Dr. Randa Evens, MD, MPH [facility] at [facility] [phone] 06/22/2021 2:47 PM

## 2021-06-22 NOTE — Progress Notes (Signed)
After finishing Venofer infusion, patient BP increased to 172/65. Patient has no other symptoms. No complaints of shortness of breath, dizziness or headache. Per Dr. Janese Banks, okay to send patient home. Informed patient to monitor BP at home and to call or go to ED if she start to have any more symptoms. Patient verbalized understanding.

## 2021-06-30 ENCOUNTER — Inpatient Hospital Stay: Payer: Medicare Other

## 2021-06-30 VITALS — BP 173/61 | HR 75 | Temp 98.2°F | Resp 16

## 2021-06-30 DIAGNOSIS — D500 Iron deficiency anemia secondary to blood loss (chronic): Secondary | ICD-10-CM

## 2021-06-30 DIAGNOSIS — D509 Iron deficiency anemia, unspecified: Secondary | ICD-10-CM

## 2021-06-30 MED ORDER — SODIUM CHLORIDE 0.9 % IV SOLN
Freq: Once | INTRAVENOUS | Status: AC
  Filled 2021-06-30: qty 250

## 2021-06-30 MED ORDER — SODIUM CHLORIDE 0.9 % IV SOLN
300.0000 mg | INTRAVENOUS | Status: DC
  Administered 2021-06-30: 300 mg via INTRAVENOUS
  Filled 2021-06-30: qty 15

## 2021-06-30 NOTE — Patient Instructions (Signed)

## 2021-07-07 ENCOUNTER — Inpatient Hospital Stay: Payer: Medicare Other

## 2021-07-07 VITALS — BP 133/73 | HR 65 | Temp 97.3°F | Resp 16

## 2021-07-07 DIAGNOSIS — D509 Iron deficiency anemia, unspecified: Secondary | ICD-10-CM | POA: Diagnosis not present

## 2021-07-07 DIAGNOSIS — D500 Iron deficiency anemia secondary to blood loss (chronic): Secondary | ICD-10-CM

## 2021-07-07 MED ORDER — SODIUM CHLORIDE 0.9 % IV SOLN
Freq: Once | INTRAVENOUS | Status: AC
  Filled 2021-07-07: qty 250

## 2021-07-07 MED ORDER — SODIUM CHLORIDE 0.9 % IV SOLN
300.0000 mg | INTRAVENOUS | Status: DC
  Administered 2021-07-07: 300 mg via INTRAVENOUS
  Filled 2021-07-07: qty 15

## 2021-08-16 ENCOUNTER — Telehealth: Payer: Self-pay | Admitting: Student

## 2021-08-16 NOTE — Telephone Encounter (Signed)
Rec'd return call from patient's son-in-law Earlene Plater and we have scheduled an In-home Consult for 08/31/21 @ 1:30 PM

## 2021-08-16 NOTE — Telephone Encounter (Signed)
Attempted to contact patient's son-in-law Shanon Brow on his cell to offer to schedule a Palliative Consult, no answer - left message with reason for call along with my name and call back number requesting a return call.

## 2021-08-31 ENCOUNTER — Other Ambulatory Visit: Payer: Medicare Other | Admitting: Student

## 2021-08-31 DIAGNOSIS — C060 Malignant neoplasm of cheek mucosa: Secondary | ICD-10-CM

## 2021-08-31 DIAGNOSIS — Z515 Encounter for palliative care: Secondary | ICD-10-CM

## 2021-08-31 DIAGNOSIS — K5900 Constipation, unspecified: Secondary | ICD-10-CM

## 2021-08-31 DIAGNOSIS — R634 Abnormal weight loss: Secondary | ICD-10-CM

## 2021-08-31 DIAGNOSIS — R52 Pain, unspecified: Secondary | ICD-10-CM

## 2021-08-31 NOTE — Progress Notes (Signed)
[facility] Consult Note Telephone: [phone]  Fax: [phone]   Date of encounter: 08/31/21 1:48 PM PATIENT NAME: [name] [name] [id number]   [phone] (home)  DOB: 1925-11-08 MRN: [id number] PRIMARY CARE PROVIDER:    Kirk Ruths, MD,  [address] [place] [address] [phone]  REFERRING PROVIDER:   Kirk Ruths, MD [facility] [place],  [place] [address] [phone]  RESPONSIBLE PARTY:    Contact Information     Name Relation Home Work [place] Son [phone]  [phone]   [name] [name] Son [phone]  [phone]   [name] [name]   [phone]        I met face to face with patient and family in the home. Palliative Care was asked to follow this patient by consultation request of  [name] Ruths, MD to address advance care planning and complex medical decision making. This is the initial visit.                                     ASSESSMENT AND PLAN / RECOMMENDATIONS:   Advance Care Planning/Goals of Care: Goals include to maximize quality of life and symptom management. Patient/health care surrogate gave his/her permission to discuss.Our advance care planning conversation included a discussion about:    The value and importance of advance care planning  Experiences with loved ones who have been seriously ill or have died  Exploration of personal, cultural or spiritual beliefs that might influence medical decisions  Exploration of goals of care in the event of a sudden injury or illness  Review and updating or creation of an  advance directive document. CODE STATUS: DNR  Education provided on Palliative Medicine vs. Hospice. Has upcoming appointment with Oncology. Not eligible for chemotherapy. Considering immunotherapy, if eligible. Would like to  to remain in the home with caregiver support. Palliative Medicine will continue to provide supportive care, symptom management as needed.   Symptom Management/Plan:  SCC of buccal mucosa-patient has upcoming appointment with oncology. She is considering immunotherapy if eligible. She had received radiation to head and neck in the past.   Pain-worsening mouth pain; taking tramadol every 6 hours, lasting around 4 hours. Also taking Tylenol  500 mg 4- 5 tablets a day. Continue Tylenol; max dose 3000 mg /day, increase Tramadol to 50 mg one tablet every 4 hours PRN. Continue using heat or ice PRN; pain 7/10. Soft foods encouraged.   Constipation-metamucil clear daily, colace BID.   Weight loss-endorses 22 pound weight loss in the past year. Decline in intake with worsening oral pain, recurrent buccal mucosal lesions. Recommend soft foods, liquids. Encourage foods that patient enjoys, recommend nutritional supplement 1-2 daily.   Follow up Palliative Care Visit: Palliative care will continue to follow for complex medical decision making, advance care planning, and clarification of goals. Return in 4 weeks or prn.  I spent 60 minutes providing this consultation. More than 50% of the time in this consultation was spent in counseling and care coordination.   PPS: 60%  HOSPICE ELIGIBILITY/DIAGNOSIS: TBD  Chief Complaint: Palliative Medicine initial consult.   HISTORY OF PRESENT ILLNESS:  [name] [name] is a 88 y.o. year old female  with SCC of buccal mucosa, hx of radiation to head and neck 11/2020; anemia, hypertension, COPD, GERD,  hx of GI bleed, peripheral neuropathy, Meniere's disease, arthritis, DDD, fibromyalgia, depression.  Patient resides at [facility]. She has daily caregivers; supportive family. Patient endorses worsening pain 7/10 today; patient with worsening lesions to left buccal mucosa and left lateral tongue. Swelling to left jaw. Reports 22 pound loss in the past year.  Drinking Boost occassionally; eating mostly soft foods. Uses walker for ambulation. Endorses constipation; taking metamucil. Currently receiving iron infusions.   History obtained from review of EMR, discussion with primary team, and interview with family, facility staff/caregiver and/or [name] [name].  I reviewed available labs, medications, imaging, studies and related documents from the EMR.  Records reviewed and summarized above.   ROS  A 10-Point ROS is negative, except for the pertinent positives/negatives detailed per the HPI.   Physical Exam:  Constitutional: NAD General: frail appearing, thin EYES: anicteric sclera, lids intact, no discharge  ENMT: hard of hearing, oral mucous membranes moist, swelling to left jaw line CV: S1S2, RRR, no LE edema Pulmonary: LCTA, no increased work of breathing, no cough, room air Abdomen: normo-active BS + 4 quadrants, soft and non tender GU: deferred MSK: +sarcopenia, moves all extremities, ambulatory Skin: warm and dry, no rashes or wounds on visible skin Neuro:  +generalized weakness, A & O x 3 Psych: non-anxious affect, pleasant Hem/lymph/immuno: no widespread bruising CURRENT PROBLEM LIST:  Patient Active Problem List   Diagnosis Date Noted   Hyponatremia 11/05/2019   Generalized weakness 11/05/2019   COPD with acute exacerbation ([facility]) 11/04/2019   Fibromyositis 06/12/2017   Pain 06/12/2017   Healthcare maintenance 01/17/2017   Anemia 05/30/2016   Angiodysplasia of intestinal tract    Blood in stool    Benign neoplasm of ascending colon    UGIB (upper gastrointestinal bleed)    GIB (gastrointestinal bleeding) 04/12/2016   Melena    Healthcare-associated pertussis 10/10/2015   Healthcare-associated pneumonia 10/10/2015   GI bleed 08/10/2015   Iron deficiency anemia due to chronic blood loss 08/10/2015   HTN (hypertension), benign 08/10/2015   HLD (hyperlipidemia) 08/10/2015   GERD (gastroesophageal reflux disease) 08/10/2015    Chronic obstructive pulmonary disease ([facility]) 08/10/2015   Glaucoma suspect 12/26/2012   Fatigue 02/12/2012   H/O total hip arthroplasty 01/25/2012   Total knee replacement status 01/25/2012   Arthritis of knee 01/18/2012   ARMD (age related macular degeneration) 11/21/2011   Foot drop 11/15/2011   Hallux valgus 11/15/2011   Hammer toe, acquired 11/15/2011   Recurrent major depressive disorder, in partial remission ([facility]) 07/10/2011   Degenerative disc disease 04/11/2011   Fibromyalgia 02/10/2011   Diverticulosis of colon 07/18/2010   Gastric ulcer 07/18/2010   Hereditary and idiopathic peripheral neuropathy 07/18/2010   Meniere's disease 07/18/2010   Osteoarthritis, generalized 07/18/2010   Hemorrhoids 07/18/2010   PAST MEDICAL HISTORY:  Active Ambulatory Problems    Diagnosis Date Noted   GI bleed 08/10/2015   Iron deficiency anemia due to chronic blood loss 08/10/2015   HTN (hypertension), benign 08/10/2015   HLD (hyperlipidemia) 08/10/2015   GERD (gastroesophageal reflux disease) 08/10/2015   Chronic obstructive pulmonary disease ([facility]) 08/10/2015   Healthcare-associated pertussis 10/10/2015   Healthcare-associated pneumonia 10/10/2015   Diverticulosis of colon 07/18/2010   Gastric ulcer 07/18/2010   Recurrent major depressive disorder, in partial remission ([facility]) 07/10/2011   Melena    GIB (gastrointestinal bleeding) 04/12/2016   UGIB (upper gastrointestinal bleed)    Blood in stool    Benign neoplasm of ascending colon    ARMD (age related macular  degeneration) 11/21/2011   Arthritis of knee 01/18/2012   Degenerative disc disease 04/11/2011   Fatigue 02/12/2012   Fibromyalgia 02/10/2011   Foot drop 11/15/2011   Glaucoma suspect 12/26/2012   H/O total hip arthroplasty 01/25/2012   Hallux valgus 11/15/2011   Hammer toe, acquired 11/15/2011   Hereditary and idiopathic peripheral neuropathy 07/18/2010   Meniere's disease 07/18/2010   Osteoarthritis, generalized  07/18/2010   Total knee replacement status 01/25/2012   Hemorrhoids 07/18/2010   Angiodysplasia of intestinal tract    Anemia 05/30/2016   Fibromyositis 06/12/2017   Healthcare maintenance 01/17/2017   Pain 06/12/2017   COPD with acute exacerbation ([facility]) 11/04/2019   Hyponatremia 11/05/2019   Generalized weakness 11/05/2019   Resolved Ambulatory Problems    Diagnosis Date Noted   No Resolved Ambulatory Problems   Past Medical History:  Diagnosis Date   Actinic keratosis    COPD (chronic obstructive pulmonary disease) (HCC)    Diverticulitis    Glaucoma    Hypertension    Iron deficiency    Rectal bleeding    Skin cancer    SOCIAL HX:  Social History   Tobacco Use   Smoking status: Former    Packs/day: 2.00    Years: 20.00    Total pack years: 40.00    Types: Cigarettes    Quit date: 04/22/1979    Years since quitting: 42.3   Smokeless tobacco: Never  Substance Use Topics   Alcohol use: Not Currently    Alcohol/week: 1.0 standard drink of alcohol    Types: 1 Glasses of wine per week   FAMILY HX:  Family History  Problem Relation Age of Onset   CAD Mother    CAD Father    CAD Brother       ALLERGIES:  Allergies  Allergen Reactions   Nsaids Hives   Dextrans Hives   Fentanyl Itching   Lipitor [Atorvastatin] Other (See Comments)    Reaction: Muscle pain   Lovastatin    Mobic [Meloxicam] Other (See Comments)    Reaction: Mouth and tongue ulcers   Niacin And Related Itching   Other    Polysaccharide K Hives   Pravachol [Pravastatin Sodium] Other (See Comments)    Reaction: Muscle pain   Statins Other (See Comments)   Tolmetin Hives   Voltaren [Diclofenac Sodium] Other (See Comments)    Reaction: Mouth and tongue ulcers   Zetia [Ezetimibe] Other (See Comments)    Reaction: Leg pain   Codeine Nausea And Vomiting   Niacin Itching     PERTINENT MEDICATIONS:  Outpatient Encounter Medications as of 08/31/2021  Medication Sig   acetaminophen (TYLENOL)  500 MG tablet Take 500-1,000 mg by mouth every 6 (six) hours as needed for mild pain, moderate pain or fever.   acidophilus (RISAQUAD) CAPS capsule Take 1 capsule by mouth daily. (Patient taking differently: Take 1 capsule by mouth in the morning and at bedtime.)   Cyanocobalamin (B-12) 1000 MCG CAPS Take 2,000 Units by mouth daily.    escitalopram (LEXAPRO) 20 MG tablet Take 20 mg by mouth daily.   gabapentin (NEURONTIN) 300 MG capsule Take 300 mg by mouth 2 (two) times daily.   hydrochlorothiazide (HYDRODIURIL) 12.5 MG tablet Take 12.5 mg by mouth daily.   ipratropium-albuterol (DUONEB) 0.5-2.5 (3) MG/3ML SOLN Inhale 3 mLs into the lungs 2 (two) times daily.   latanoprost (XALATAN) 0.005 % ophthalmic solution Place 1 drop into both eyes at bedtime.   losartan (COZAAR) 50 MG tablet Take 50  mg by mouth daily.   mometasone (ELOCON) 0.1 % lotion Apply 1 application topically daily as needed (skin irritation).   Multiple Vitamins-Minerals (OCUVITE PRESERVISION PO) Take 1 tablet by mouth 2 (two) times daily.   potassium chloride (KLOR-CON) 10 MEQ tablet Take 10 mEq by mouth 3 (three) times daily.   RABEprazole (ACIPHEX) 20 MG tablet Take 20 mg by mouth 2 (two) times daily.   Facility-Administered Encounter Medications as of 08/31/2021  Medication   0.9 %  sodium chloride infusion   Thank you for the opportunity to participate in the care of Ms. Kall.  The palliative care team will continue to follow. Please call our office at [phone] if we can be of additional assistance.   Ezekiel Slocumb, NP   COVID-19 PATIENT SCREENING TOOL Asked and negative response unless otherwise noted:  Have you had symptoms of covid, tested positive or been in contact with someone with symptoms/positive test in the past 5-10 days? No

## 2021-09-16 ENCOUNTER — Other Ambulatory Visit: Payer: Medicare Other | Admitting: Student

## 2021-09-16 DIAGNOSIS — B370 Candidal stomatitis: Secondary | ICD-10-CM

## 2021-09-16 DIAGNOSIS — R630 Anorexia: Secondary | ICD-10-CM

## 2021-09-16 DIAGNOSIS — R52 Pain, unspecified: Secondary | ICD-10-CM

## 2021-09-16 DIAGNOSIS — Z515 Encounter for palliative care: Secondary | ICD-10-CM

## 2021-09-16 NOTE — Progress Notes (Signed)
[facility] Pall[facility] Consult Note Telephone: [phone]  Fax: [phone]    Date of encounter: 09/16/21 12:35 PM PATIENT NAME: [name] [name] [address] [place] [id number]   [phone] (home)  DOB: 12/18/1925 MRN: [id number] PRIMARY CARE PROVIDER:    Kirk Ruths, MD,  [address] [place] [address] [phone]  REFERRING PROVIDER:   Kirk Ruths, MD [facility] [place],  [place] [address] [phone]  RESPONSIBLE PARTY:    Contact Information     Name Relation Home Work [place] Son [phone]  [phone]   Hardrick,[name] Son [phone]  [phone]   ratliff,charles Denman George   [phone]        I met face to face with patient and family in the home. Palliative Care was asked to follow this patient by consultation request of  Kirk Ruths, MD to address advance care planning and complex medical decision making. This is a follow up visit.                                   ASSESSMENT AND PLAN / RECOMMENDATIONS:   Advance Care Planning/Goals of Care: Goals include to maximize quality of life and symptom management. Patient/health care surrogate gave his/her permission to discuss. Our advance care planning conversation included a discussion about:    The value and importance of advance care planning  Experiences with loved ones who have been seriously ill or have died  Exploration of personal, cultural or spiritual beliefs that might influence medical decisions  Exploration of goals of care in the event of a sudden injury or illness  CODE STATUS: DNR    Education provided on Palliative Medicine vs. Hospice. Patient plans to receive immunotherapy q 21 days x 3 then PET scan. Will continue to provide supportive care, symptom management as needed.   Symptom Management/Plan:  SCC of buccal  mucosa-patient will receive immunotherapy q 21 days x 3, then follow up PET scan. She is to follow up with oncology as scheduled.   Pain, oral candida- Continue tramadol every 4 hours PRN, Tylenol every 4 hours PRN. She is to continue clotrimazole [place] x 14 weeks and viscous lidocaine PRN.   Appetite-patient endorses some improvement in her appetite. She is eating foods she enjoys and drinking supplements.   Follow up Palliative Care Visit: Palliative care will continue to follow for complex medical decision making, advance care planning, and clarification of goals. Return in 4 weeks or prn.   This visit was coded based on medical decision making (MDM).  PPS: 60%  HOSPICE ELIGIBILITY/DIAGNOSIS: TBD  Chief Complaint: Palliative Medicine follow up visit.   HISTORY OF PRESENT ILLNESS:  [name] [name] is a 88 y.o. year old female  with SCC of buccal mucosa, hx of radiation to head and neck 11/2020; anemia, hypertension, COPD, GERD, hx of GI bleed, peripheral neuropathy, Meniere's disease, arthritis, DDD, fibromyalgia, depression.   Patient resides at [facility]. She states pain has been managed better with increasing tramadol frequency. Also using viscous lidocaine. She has been moving her bowels. Her appetite has improved some; eating foods she enjoys and is drinking supplements. No functional declines reported. She has upcoming appointment for iron infusion.   History obtained from review of EMR, discussion with primary team, and interview with family, facility staff/caregiver and/or  Ms. Shannon.  I reviewed available labs, medications, imaging, studies and related documents from the EMR.  Records reviewed and summarized above.   ROS  A 10-Point ROS is negative, except for the pertinent positives/negatives detailed per the HPI.   Physical Exam:  Constitutional: NAD General: frail appearing, thin EYES: anicteric sclera, lids intact, no discharge  ENMT: slight hard of  hearing, oral mucous membranes moist CV: S1S2, RRR, no LE edema Pulmonary: LCTA, no increased work of breathing, no cough, room air Abdomen: normo-active BS + 4 quadrants, soft and non tender GU: deferred MSK: no sarcopenia, moves all extremities, ambulatory Skin: cool and dry, no rashes or wounds on visible skin Neuro: +generalized weakness, Psych: non-anxious affect, A and O x 3 Hem/lymph/immuno: no widespread bruising   Thank you for the opportunity to participate in the care of Ms. Begay.  The palliative care team will continue to follow. Please call our office at [phone] if we can be of additional assistance.   Ezekiel Slocumb, NP   COVID-19 PATIENT SCREENING TOOL Asked and negative response unless otherwise noted:   Have you had symptoms of covid, tested positive or been in contact with someone with symptoms/positive test in the past 5-10 days? No

## 2021-09-22 ENCOUNTER — Inpatient Hospital Stay: Payer: Medicare Other | Attending: Oncology

## 2021-09-22 DIAGNOSIS — D509 Iron deficiency anemia, unspecified: Secondary | ICD-10-CM | POA: Insufficient documentation

## 2021-09-22 LAB — CBC WITH DIFFERENTIAL/PLATELET
Abs Immature Granulocytes: 0.06 10*3/uL (ref 0.00–0.07)
Basophils Absolute: 0.2 10*3/uL — ABNORMAL HIGH (ref 0.0–0.1)
Basophils Relative: 1 %
Eosinophils Absolute: 0.3 10*3/uL (ref 0.0–0.5)
Eosinophils Relative: 2 %
HCT: 35.4 % — ABNORMAL LOW (ref 36.0–46.0)
Hemoglobin: 11.7 g/dL — ABNORMAL LOW (ref 12.0–15.0)
Immature Granulocytes: 1 %
Lymphocytes Relative: 7 %
Lymphs Abs: 0.9 10*3/uL (ref 0.7–4.0)
MCH: 27.6 pg (ref 26.0–34.0)
MCHC: 33.1 g/dL (ref 30.0–36.0)
MCV: 83.5 fL (ref 80.0–100.0)
Monocytes Absolute: 1.1 10*3/uL — ABNORMAL HIGH (ref 0.1–1.0)
Monocytes Relative: 9 %
Neutro Abs: 9.8 10*3/uL — ABNORMAL HIGH (ref 1.7–7.7)
Neutrophils Relative %: 80 %
Platelets: 402 10*3/uL — ABNORMAL HIGH (ref 150–400)
RBC: 4.24 MIL/uL (ref 3.87–5.11)
RDW: 15.8 % — ABNORMAL HIGH (ref 11.5–15.5)
WBC: 12.2 10*3/uL — ABNORMAL HIGH (ref 4.0–10.5)
nRBC: 0 % (ref 0.0–0.2)

## 2021-09-22 LAB — IRON AND TIBC
Iron: 37 ug/dL (ref 28–170)
Saturation Ratios: 14 % (ref 10.4–31.8)
TIBC: 270 ug/dL (ref 250–450)
UIBC: 233 ug/dL

## 2021-09-22 LAB — FERRITIN: Ferritin: 117 ng/mL (ref 11–307)

## 2021-10-06 ENCOUNTER — Other Ambulatory Visit: Payer: Medicare Other | Admitting: Student

## 2021-10-06 DIAGNOSIS — R110 Nausea: Secondary | ICD-10-CM

## 2021-10-06 DIAGNOSIS — R52 Pain, unspecified: Secondary | ICD-10-CM

## 2021-10-06 DIAGNOSIS — R630 Anorexia: Secondary | ICD-10-CM

## 2021-10-06 DIAGNOSIS — G893 Neoplasm related pain (acute) (chronic): Secondary | ICD-10-CM | POA: Insufficient documentation

## 2021-10-06 DIAGNOSIS — Z515 Encounter for palliative care: Secondary | ICD-10-CM

## 2021-10-06 DIAGNOSIS — C060 Malignant neoplasm of cheek mucosa: Secondary | ICD-10-CM

## 2021-10-06 DIAGNOSIS — B370 Candidal stomatitis: Secondary | ICD-10-CM

## 2021-10-06 NOTE — Progress Notes (Signed)
[facility] Consult Note Telephone: [phone]  Fax: [phone]    Date of encounter: 10/06/21 1:46 PM PATIENT NAME: [name] [name] [address] [place] [address]   [phone] (home)  DOB: January 28, 1925 MRN: [id number] PRIMARY CARE PROVIDER:    Kirk Ruths, MD,  [address] [place] [address] [phone]  REFERRING PROVIDER:   Kirk Ruths, MD [facility] [place],  [place] [address] [phone]  RESPONSIBLE PARTY:    Contact Information     Name Relation Home Work [place] Son [phone]  [phone]   Funari,[name] Son [phone]  [phone]   ratliff,charles Denman George   [phone]        I met face to face with patient in the home. Palliative Care was asked to follow this patient by consultation request of  Kirk Ruths, MD to address advance care planning and complex medical decision making. This is a follow up visit.                                   ASSESSMENT AND PLAN / RECOMMENDATIONS:   Advance Care Planning/Goals of Care: Goals include to maximize quality of life and symptom management. Patient/health care surrogate gave his/her permission to discuss. Our advance care planning conversation included a discussion about:    The value and importance of advance care planning  Experiences with loved ones who have been seriously ill or have died  Exploration of personal, cultural or spiritual beliefs that might influence medical decisions  Exploration of goals of care in the event of a sudden injury or illness  CODE STATUS: DNR  Palliative Medicine will continue to provide supportive care, symptom management. Education provided on Palliative vs. Hospice. Patient currently receiving Keytruda every 21 days; first treatment yesterday.   Symptom Management/Plan:  SCC of buccal  mucosa-receiving immunotherapy every 21 days x 3. She received first treatment yesterday. She reports worsening pain in the past couple of days. She is to follow up with oncology as scheduled.   Pain, oral candida-start oxycodone 5 mg every 4 hours PRN pain. Continue acetaminophen every 4 hours PRN. Extend clotrimazole troche 5 times a day x 7 more days, viscous lidocaine PRN.   Appetite-fair appetite; encourage soft foods, nutritional supplements.   Nausea-continue ondansetron every 6 hours PRN.  Follow up Palliative Care Visit: Palliative care will continue to follow for complex medical decision making, advance care planning, and clarification of goals. Return in 1 week or prn.   This visit was coded based on medical decision making (MDM).  PPS: 50%  HOSPICE ELIGIBILITY/DIAGNOSIS: TBD  Chief Complaint: Worsening pain.   HISTORY OF PRESENT ILLNESS:  [name] [name] is a 88 y.o. year old female  with SCC of buccal mucosa, hx of radiation to head and neck 11/2020; anemia, hypertension, COPD, GERD, hx of GI bleed, peripheral neuropathy, Meniere's disease, arthritis, DDD, fibromyalgia, depression.   Patient resides at [facility]. Patient endorses worsening pain in past couple of days. Appetite has been fair. Having some nausea. She expresses feeling overwhelmed today as there was a conflict with her caregivers schedule.   History obtained from review of EMR, discussion with primary team, and interview with family, facility staff/caregiver and/or Ms. Kiner.  I reviewed available labs, medications, imaging, studies and related documents from the EMR.  Records reviewed and summarized above.   ROS   10-Point ROS is negative, except for the pertinent positives/negatives detailed per the HPI.  Physical Exam: Constitutional: NAD General: frail appearing, thin  EYES: anicteric sclera, lids intact, no discharge  ENMT: slight hard of  hearing, oral mucous membranes moist, erythema to  left jaw CV: S1S2, RRR, no LE edema Pulmonary: LCTA, no increased work of breathing, no cough, room air Abdomen: normo-active BS + 4 quadrants, soft and non tender GU: deferred MSK: sarcopenia, moves all extremities, ambulatory with rollator Skin: warm and dry, no rashes or wounds on visible skin Neuro: + generalized weakness Psych: non-anxious affect, A and O x 3 Hem/lymph/immuno: no widespread bruising   Thank you for the opportunity to participate in the care of Ms. Surrette.  The palliative care team will continue to follow. Please call our office at [phone] if we can be of additional assistance.   Ezekiel Slocumb, NP   COVID-19 PATIENT SCREENING TOOL Asked and negative response unless otherwise noted:   Have you had symptoms of covid, tested positive or been in contact with someone with symptoms/positive test in the past 5-10 days? No

## 2021-10-12 ENCOUNTER — Other Ambulatory Visit: Payer: Medicare Other | Admitting: Student

## 2021-10-12 DIAGNOSIS — R630 Anorexia: Secondary | ICD-10-CM

## 2021-10-12 DIAGNOSIS — K5900 Constipation, unspecified: Secondary | ICD-10-CM

## 2021-10-12 DIAGNOSIS — R52 Pain, unspecified: Secondary | ICD-10-CM

## 2021-10-12 DIAGNOSIS — Z515 Encounter for palliative care: Secondary | ICD-10-CM

## 2021-10-12 DIAGNOSIS — C060 Malignant neoplasm of cheek mucosa: Secondary | ICD-10-CM

## 2021-10-12 NOTE — Progress Notes (Signed)
Therapist, nutritional Palliative Care Consult Note Telephone: [phone]  Fax: [phone]    Date of encounter: 10/12/21 3:18 PM PATIENT NAME: [name] [name] [address] [place] [address]   [phone] (home)  DOB: 03-Apr-1925 MRN: [id number] PRIMARY CARE PROVIDER:    Lauro Regulus, MD,  [address] [facility] [place] [address] [phone]  REFERRING PROVIDER:   Lauro Regulus, MD [address] [facility] Charline Bills [place],  [place] [address] [phone]  RESPONSIBLE PARTY:    Contact Information     Name Relation Home Work [place] Son [phone]  [phone]   Soberanis,[name] Son [phone]  [phone]   ratliff,charles Clearence Cheek   [phone]        I met face to face with patient and family in the home. Palliative Care was asked to follow this patient by consultation request of  Lauro Regulus, MD to address advance care planning and complex medical decision making. This is a follow up visit.                                   ASSESSMENT AND PLAN / RECOMMENDATIONS:   Advance Care Planning/Goals of Care: Goals include to maximize quality of life and symptom management. Patient/health care surrogate gave his/her permission to discuss. CODE STATUS: DNR  Palliative Medicine will continue to provide supportive care, symptom management. Education provided on Palliative vs. Hospice services.  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 She is to follow up with oncology as scheduled.  Pain-patients pain has been better managed with oxycodone, although she has been only taking 2.5 mg every 4 hours and pain is returning 3-3.5 hours. She is encouraged to take full tablet to equal 5 mg every 4 hours PRN. Continue gabapentin BID. Continues ice packs PRN.  Constipation-recommend  restarting miralax 17 gm daily, continue fiber daily. Start dulcolax 5 mg PRN. She has senokot but declines to take due to causing abd cramps.   Appetite-continue eating soft foods, foods she enjoys.   Follow up Palliative Care Visit: Palliative care will continue to follow for complex medical decision making, advance care planning, and clarification of goals. Return in 4 weeks or prn.   This visit was coded based on medical decision making (MDM).  PPS: 50%  HOSPICE ELIGIBILITY/DIAGNOSIS: TBD  Chief Complaint: Palliative Medicine follow up visit.   HISTORY OF PRESENT ILLNESS:  [name] [name] is a 88 y.o. year old female  with SCC of buccal mucosa, hx of radiation to head and neck 11/2020; anemia, hypertension, COPD, GERD, hx of GI bleed, peripheral neuropathy, Meniere's disease, arthritis, DDD, fibromyalgia, depression.   Patient resides at [facility] [place]. Patient states her pain is improving, lasting about 3- 3.5 hours. She had been only taking the 2.5 mg. She does report constipation. She reports increased mucous at night. She reports some neuropathy to legs and tingling to her face. She is receiving gabapentin BID. Appetite is poor to fair.   History obtained from review of EMR, discussion with primary team, and interview with family, facility staff/caregiver and/or [name] [name].  I reviewed available labs, medications, imaging, studies and related documents from the EMR.  Records reviewed and summarized above.   ROS  10-Point ROS is negative, except for the pertinent positives/negatives detailed per the HPI.   Physical  Exam: Constitutional: NAD General: frail appearing EYES: anicteric sclera, lids intact, no discharge  ENMT: intact hearing, oral mucous membranes moist CV: S1S2, RRR, no LE edema Pulmonary: LCTA, no increased work of breathing, no cough, room air Abdomen: normo-active BS + 4 quadrants, soft and non tender GU: deferred MSK: +sarcopenia, moves all  extremities, ambulatory Skin: warm and dry, no rashes or wounds on visible skin Neuro: +generalized weakness,  no cognitive impairment Psych: non-anxious affect, A and O x 3 Hem/lymph/immuno: no widespread bruising   Thank you for the opportunity to participate in the care of [name] [name].  The palliative care team will continue to follow. Please call our office at [phone] if we can be of additional assistance.   Ezekiel Slocumb, NP   COVID-19 PATIENT SCREENING TOOL Asked and negative response unless otherwise noted:   Have you had symptoms of covid, tested positive or been in contact with someone with symptoms/positive test in the past 5-10 days? No

## 2021-10-19 ENCOUNTER — Other Ambulatory Visit: Payer: Medicare Other | Admitting: Student

## 2021-10-19 DIAGNOSIS — B370 Candidal stomatitis: Secondary | ICD-10-CM

## 2021-10-19 DIAGNOSIS — Z515 Encounter for palliative care: Secondary | ICD-10-CM

## 2021-10-19 DIAGNOSIS — R52 Pain, unspecified: Secondary | ICD-10-CM

## 2021-10-20 NOTE — Progress Notes (Signed)
Designer, jewellery Palliative Care Consult Note Telephone: [phone]  Fax: [phone]    Date of encounter: 10/19/21 3:30PM PATIENT NAME: [name] [name] [place] [id number]   [phone] (home)  DOB: 1925-11-29 MRN: [id number] PRIMARY CARE PROVIDER:    Kirk Ruths, MD,  [address] [place] [address] [phone]  REFERRING PROVIDER:   Kirk Ruths, MD [facility] [place],   [address] [phone]  RESPONSIBLE PARTY:    Contact Information     Name Relation Home Work [place] Son [phone]  [phone]   Dec,[name] Son [phone]  [phone]   Pomaria Friend   [phone]       Due to the COVID-19 crisis, this visit was completed via telemedicine from my office and it was initiated and consent by this patient or PROXY. This home visit was completed via telephone due to the patient's inability to connect via an audiovisual connection or their refusal to have an in-person visit. This connection was agreed to by the patient or PROXY. I verified that I am speaking with the correct person using two identifiers.                                    ASSESSMENT AND PLAN / RECOMMENDATIONS:   Advance Care Planning/Goals of Care: Goals include to maximize quality of life and symptom management. Patient/health care surrogate gave his/her permission to discuss. CODE STATUS: DNR  Palliative Medicine will continue to provide supportive care, symptom management as needed.   Symptom Management/Plan:  Oral candida-patient continues to have oral thrush. Will continue clotrimazole troche x 7 more days. Fluconazole 150 mg x 1 dose.   Pain-patient endorses improvement in her pain. Continue oxycodone 5 mg every 4 hours PRN, gabapentin BID, ice packs PRN.   Follow up Palliative Care Visit: Palliative care  will continue to follow for complex medical decision making, advance care planning, and clarification of goals. Return IN 4 weeks or prn.  I spent 15 minutes providing this consultation. More than 50% of the time in this consultation was spent in counseling and care coordination.   PPS: 50%  HOSPICE ELIGIBILITY/DIAGNOSIS: TBD  Chief Complaint: Palliative Medicine follow up visit; thrush.  HISTORY OF PRESENT ILLNESS:  [name] [name] is a 88 y.o. year old female  with SCC of buccal mucosa, hx of radiation to head and neck 11/2020; anemia, hypertension, COPD, GERD, hx of GI bleed, peripheral neuropathy, Meniere's disease, arthritis, DDD, fibromyalgia, depression.   Patient resides at [facility]. Patient reports still having thrush. She is requesting refill on clotrimazole troches. She states her pain has is better managed, improved.   Caregiver sent pics of oral cavity; thrush still present.   History obtained from review of EMR, discussion with primary team, and interview with family, facility staff/caregiver and/or [name].  I reviewed available labs, medications, imaging, studies and related documents from the EMR.  Records reviewed and summarized above.   ROS  A 10-Point ROS is negative, except for the pertinent positives and negatives detailed per the HPI.   Physical Exam: Deferred to this being a telemedicine visit.   Thank you for the opportunity to participate in the care of [name].  The palliative care team will continue to follow. Please call our office at [phone] if we  can be of additional assistance.   Ezekiel Slocumb, NP   COVID-19 PATIENT SCREENING TOOL Asked and negative response unless otherwise noted:   Have you had symptoms of covid, tested positive or been in contact with someone with symptoms/positive test in the past 5-10 days? No

## 2021-11-11 ENCOUNTER — Telehealth: Payer: Self-pay | Admitting: Student

## 2021-11-11 NOTE — Telephone Encounter (Signed)
Palliative NP attempted to reach patient. Unable to leave voicemail. NP dis speak with son in law Shanon Brow. No needs identified at this time. They are encouraged to call with any concerns.

## 2021-11-15 ENCOUNTER — Other Ambulatory Visit: Payer: Medicare Other | Admitting: Student

## 2021-11-15 DIAGNOSIS — R21 Rash and other nonspecific skin eruption: Secondary | ICD-10-CM

## 2021-11-15 DIAGNOSIS — E46 Unspecified protein-calorie malnutrition: Secondary | ICD-10-CM

## 2021-11-15 DIAGNOSIS — Z515 Encounter for palliative care: Secondary | ICD-10-CM

## 2021-11-15 DIAGNOSIS — R630 Anorexia: Secondary | ICD-10-CM

## 2021-11-15 DIAGNOSIS — R52 Pain, unspecified: Secondary | ICD-10-CM

## 2021-11-15 DIAGNOSIS — C060 Malignant neoplasm of cheek mucosa: Secondary | ICD-10-CM

## 2021-11-15 NOTE — Progress Notes (Unsigned)
Designer, jewellery Palliative Care Consult Note Telephone: [phone]  Fax: [phone]    Date of encounter: 11/15/21 2:23 PM PATIENT NAME: [name] [name] [id number]   [phone] (home)  DOB: [birth year] MRN: [id number] PRIMARY CARE PROVIDER:    Kirk Ruths, MD,  [address] [place] [address] [phone]  REFERRING PROVIDER:   Kirk Ruths, MD [facility] [place],  [place] [address] [phone]  RESPONSIBLE PARTY:    Contact Information     Name Relation Home Work [place] Son [phone]  [phone]   Preuss,[name] Son [phone]  [phone]   ratliff,charles Denman George   [phone]        I met face to face with patient and family in the home. Palliative Care was asked to follow this patient by consultation request of  Kirk Ruths, MD to address advance care planning and complex medical decision making. This is a follow up visit.                                   ASSESSMENT AND PLAN / RECOMMENDATIONS:   Advance Care Planning/Goals of Care: Goals include to maximize quality of life and symptom management. Patient/health care surrogate gave his/her permission to discuss. Our advance care planning conversation included a discussion about:    The value and importance of advance care planning  Experiences with loved ones who have been seriously ill or have died  Exploration of personal, cultural or spiritual beliefs that might influence medical decisions  Exploration of goals of care in the event of a sudden injury or illness  CODE STATUS: DNR  Palliative Medicine will continue to provide supportive care, symptom management as needed.    Symptom Management/Plan:  SCC of buccal mucosa-receiving immunotherapy every 21 days x 3. She has scheduled treatment tomorrow; plan for f/u imaging  in upcoming weeks. She is to follow up with oncology as scheduled.    Pain-patient with increased pain; will start oxycodone ER 15 mg every 12 hours; continue oxycodone 2.5-76m every 4 hours PRN breakthrough pain. Continue acetaminophen, gabapentin 3060mBID. She is also using viscous lidocaine prior to eating, which has been helpful.   Decline in appetite, protein calorie malnutrition-recommend Boost nutritional supplement daily; patient to trial protein powder supplement added to food. Recommend soft foods, foods patient enjoys.   Rash-patient with rash that developed over past 1.5 weeks to her bilateral upper extremities, bilateral feet, back and abdomen. 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 Will likely need systemic treatment. Will defer to oncology or PCP. Prescription sent for clobetasol to be applied twice daily to affected areas. She is to follow up with oncology tomorrow.   Follow up Palliative Care Visit: Palliative care will continue to follow for complex medical decision making, advance care planning, and clarification of goals. Return in 3-4 weeks or prn.  This visit was coded based on medical decision making (MDM).  PPS: 50%  HOSPICE ELIGIBILITY/DIAGNOSIS: TBD  Chief Complaint: Palliative Medicine follow up visit.   HISTORY OF PRESENT ILLNESS:  [name] [name] a [age].o. year old female  with  SCC of buccal mucosa, hx of radiation to head and neck 11/2020; anemia, hypertension, COPD, GERD, hx of GI bleed, peripheral neuropathy, Meniere's disease, arthritis, DDD, fibromyalgia, depression.   [facility]  [place] [place] Reports rash that appeared in 1.5 weeks, rash to bilateral upper extremities, bilateral feet, back and abdomen. She has been using hydrocortisone. Denies any new products, irritants. She was started on Azithromycin yesterday per pulmonologist; otherwise no new medications. She is receiving immunotherapy every 21 days; next treatment is scheduled for tomorrow.  She reports having increased pain; she had started taking 1/2 tablet around 2 hours after 5 mg dose. Pain at worst is a 8/10; gets down to a 3-4/10. Her thrush has improved. Appetite poor to fair; weight is 150 pounds. She is eating soft foods; drinking Boost occasionally. Patient received sitting in living room. SIL Shanon Brow and caregiver Jeani Hawking are present. She has eruptions/ blisters to bilateral arms feet, lower back, abdomen. Pedal edema new in past couple of days, right > left.    History obtained from review of EMR, discussion with primary team, and interview with family, facility staff/caregiver and/or [name] [name].  I reviewed available labs, medications, imaging, studies and related documents from the EMR.  Records reviewed and summarized above.   ROS  A 10-Point ROS is negative, except for the pertinent positives and negatives detailed per the HPI.   Physical Exam: Weight: 150 pounds Constitutional: NAD General: frail appearing, thin EYES: anicteric sclera, lids intact, no discharge  ENMT: hard of hearing, oral mucous membranes moist CV: S1S2, RRR, pedal LE edema, Right > left.  Pulmonary: LCTA, no increased work of breathing, no cough, room air Abdomen:  normo-active BS + 4 quadrants, soft and non tender, no ascites GU: deferred MSK: + sarcopenia, moves all extremities, ambulatory Skin: warm and dry Neuro:  + generalized weakness,  no cognitive impairment Psych: non-anxious affect, A and O x 3 Hem/lymph/immuno: no widespread bruising   Thank you for the opportunity to participate in the care of [name] [name]. Please call our office at [phone] if we can be of additional assistance.   Ezekiel Slocumb, NP   COVID-19 PATIENT SCREENING TOOL Asked and negative response unless otherwise noted:   Have you had symptoms of covid, tested positive or been in contact with someone with symptoms/positive test in the past 5-10 days? No

## 2021-11-16 DIAGNOSIS — R21 Rash and other nonspecific skin eruption: Secondary | ICD-10-CM | POA: Insufficient documentation

## 2021-11-24 ENCOUNTER — Inpatient Hospital Stay
Admission: EM | Admit: 2021-11-24 | Discharge: 2021-11-29 | DRG: 872 | Disposition: A | Discharge status: Skilled Nursing Facility | Payer: Medicare Other | Source: Skilled Nursing Facility | Attending: Osteopathic Medicine | Admitting: Osteopathic Medicine

## 2021-11-24 ENCOUNTER — Emergency Department: Payer: Medicare Other

## 2021-11-24 ENCOUNTER — Other Ambulatory Visit: Payer: Self-pay

## 2021-11-24 DIAGNOSIS — E785 Hyperlipidemia, unspecified: Secondary | ICD-10-CM | POA: Diagnosis present

## 2021-11-24 DIAGNOSIS — H3530 Unspecified macular degeneration: Secondary | ICD-10-CM | POA: Diagnosis present

## 2021-11-24 DIAGNOSIS — I10 Essential (primary) hypertension: Secondary | ICD-10-CM | POA: Diagnosis present

## 2021-11-24 DIAGNOSIS — G8929 Other chronic pain: Secondary | ICD-10-CM | POA: Diagnosis present

## 2021-11-24 DIAGNOSIS — E871 Hypo-osmolality and hyponatremia: Secondary | ICD-10-CM | POA: Diagnosis present

## 2021-11-24 DIAGNOSIS — D122 Benign neoplasm of ascending colon: Secondary | ICD-10-CM | POA: Diagnosis present

## 2021-11-24 DIAGNOSIS — Z888 Allergy status to other drugs, medicaments and biological substances status: Secondary | ICD-10-CM

## 2021-11-24 DIAGNOSIS — G609 Hereditary and idiopathic neuropathy, unspecified: Secondary | ICD-10-CM | POA: Diagnosis present

## 2021-11-24 DIAGNOSIS — T451X5A Adverse effect of antineoplastic and immunosuppressive drugs, initial encounter: Secondary | ICD-10-CM | POA: Diagnosis present

## 2021-11-24 DIAGNOSIS — Z90710 Acquired absence of both cervix and uterus: Secondary | ICD-10-CM

## 2021-11-24 DIAGNOSIS — Z66 Do not resuscitate: Secondary | ICD-10-CM | POA: Diagnosis present

## 2021-11-24 DIAGNOSIS — Z885 Allergy status to narcotic agent status: Secondary | ICD-10-CM | POA: Diagnosis not present

## 2021-11-24 DIAGNOSIS — F3341 Major depressive disorder, recurrent, in partial remission: Secondary | ICD-10-CM | POA: Diagnosis present

## 2021-11-24 DIAGNOSIS — D84821 Immunodeficiency due to drugs: Secondary | ICD-10-CM | POA: Diagnosis present

## 2021-11-24 DIAGNOSIS — J449 Chronic obstructive pulmonary disease, unspecified: Secondary | ICD-10-CM | POA: Diagnosis present

## 2021-11-24 DIAGNOSIS — Z1152 Encounter for screening for COVID-19: Secondary | ICD-10-CM | POA: Diagnosis not present

## 2021-11-24 DIAGNOSIS — E782 Mixed hyperlipidemia: Secondary | ICD-10-CM | POA: Diagnosis not present

## 2021-11-24 DIAGNOSIS — H409 Unspecified glaucoma: Secondary | ICD-10-CM | POA: Diagnosis present

## 2021-11-24 DIAGNOSIS — L03115 Cellulitis of right lower limb: Secondary | ICD-10-CM | POA: Diagnosis present

## 2021-11-24 DIAGNOSIS — C060 Malignant neoplasm of cheek mucosa: Secondary | ICD-10-CM | POA: Diagnosis present

## 2021-11-24 DIAGNOSIS — Z8249 Family history of ischemic heart disease and other diseases of the circulatory system: Secondary | ICD-10-CM

## 2021-11-24 DIAGNOSIS — A419 Sepsis, unspecified organism: Secondary | ICD-10-CM | POA: Diagnosis present

## 2021-11-24 DIAGNOSIS — L03116 Cellulitis of left lower limb: Secondary | ICD-10-CM | POA: Diagnosis present

## 2021-11-24 DIAGNOSIS — D500 Iron deficiency anemia secondary to blood loss (chronic): Secondary | ICD-10-CM | POA: Diagnosis present

## 2021-11-24 DIAGNOSIS — Z886 Allergy status to analgesic agent status: Secondary | ICD-10-CM | POA: Diagnosis not present

## 2021-11-24 DIAGNOSIS — L03119 Cellulitis of unspecified part of limb: Principal | ICD-10-CM

## 2021-11-24 DIAGNOSIS — K219 Gastro-esophageal reflux disease without esophagitis: Secondary | ICD-10-CM | POA: Diagnosis present

## 2021-11-24 DIAGNOSIS — B029 Zoster without complications: Secondary | ICD-10-CM | POA: Diagnosis not present

## 2021-11-24 DIAGNOSIS — Z85828 Personal history of other malignant neoplasm of skin: Secondary | ICD-10-CM | POA: Diagnosis not present

## 2021-11-24 LAB — CBC WITH DIFFERENTIAL/PLATELET
Abs Immature Granulocytes: 0.07 10*3/uL (ref 0.00–0.07)
Basophils Absolute: 0.1 10*3/uL (ref 0.0–0.1)
Basophils Relative: 1 %
Eosinophils Absolute: 0 10*3/uL (ref 0.0–0.5)
Eosinophils Relative: 0 %
HCT: 30.9 % — ABNORMAL LOW (ref 36.0–46.0)
Hemoglobin: 10.1 g/dL — ABNORMAL LOW (ref 12.0–15.0)
Immature Granulocytes: 0 %
Lymphocytes Relative: 5 %
Lymphs Abs: 0.9 10*3/uL (ref 0.7–4.0)
MCH: 26.3 pg (ref 26.0–34.0)
MCHC: 32.7 g/dL (ref 30.0–36.0)
MCV: 80.5 fL (ref 80.0–100.0)
Monocytes Absolute: 2 10*3/uL — ABNORMAL HIGH (ref 0.1–1.0)
Monocytes Relative: 12 %
Neutro Abs: 13.2 10*3/uL — ABNORMAL HIGH (ref 1.7–7.7)
Neutrophils Relative %: 82 %
Platelets: 448 10*3/uL — ABNORMAL HIGH (ref 150–400)
RBC: 3.84 MIL/uL — ABNORMAL LOW (ref 3.87–5.11)
RDW: 14.7 % (ref 11.5–15.5)
WBC: 16.2 10*3/uL — ABNORMAL HIGH (ref 4.0–10.5)
nRBC: 0 % (ref 0.0–0.2)

## 2021-11-24 LAB — LACTIC ACID, PLASMA
Lactic Acid, Venous: 0.8 mmol/L (ref 0.5–1.9)
Lactic Acid, Venous: 0.9 mmol/L (ref 0.5–1.9)

## 2021-11-24 LAB — COMPREHENSIVE METABOLIC PANEL
ALT: 9 U/L (ref 0–44)
AST: 19 U/L (ref 15–41)
Albumin: 3 g/dL — ABNORMAL LOW (ref 3.5–5.0)
Alkaline Phosphatase: 61 U/L (ref 38–126)
Anion gap: 9 (ref 5–15)
BUN: 24 mg/dL — ABNORMAL HIGH (ref 8–23)
CO2: 24 mmol/L (ref 22–32)
Calcium: 8.9 mg/dL (ref 8.9–10.3)
Chloride: 97 mmol/L — ABNORMAL LOW (ref 98–111)
Creatinine, Ser: 0.58 mg/dL (ref 0.44–1.00)
GFR, Estimated: >60 mL/min (ref >60)
Glucose, Bld: 107 mg/dL — ABNORMAL HIGH (ref 70–99)
Potassium: 4.5 mmol/L (ref 3.5–5.1)
Sodium: 130 mmol/L — ABNORMAL LOW (ref 135–145)
Total Bilirubin: 0.6 mg/dL (ref 0.3–1.2)
Total Protein: 7.2 g/dL (ref 6.5–8.1)

## 2021-11-24 LAB — PROTIME-INR
INR: 1.1 (ref 0.8–1.2)
Prothrombin Time: 14 seconds (ref 11.4–15.2)

## 2021-11-24 LAB — RESP PANEL BY RT-PCR (FLU A&B, COVID) ARPGX2
Influenza A by PCR: NEGATIVE
Influenza B by PCR: NEGATIVE
SARS Coronavirus 2 by RT PCR: NEGATIVE

## 2021-11-24 MED ORDER — SENNOSIDES-DOCUSATE SODIUM 8.6-50 MG PO TABS: 1.0000 | ORAL_TABLET | Freq: Every evening | ORAL | Status: DC | PRN

## 2021-11-24 MED ORDER — ONDANSETRON HCL 4 MG PO TABS: 4.0000 mg | ORAL_TABLET | Freq: Four times a day (QID) | ORAL | Status: DC | PRN

## 2021-11-24 MED ORDER — BISACODYL 5 MG PO TBEC: 5.0000 mg | DELAYED_RELEASE_TABLET | Freq: Every day | ORAL | Status: DC | PRN

## 2021-11-24 MED ORDER — HYDRALAZINE HCL 20 MG/ML IJ SOLN: 5.0000 mg | Freq: Four times a day (QID) | INTRAMUSCULAR | Status: DC | PRN

## 2021-11-24 MED ORDER — TIMOLOL MALEATE 0.5 % OP SOLN
1.0000 [drp] | Freq: Every day | OPHTHALMIC | Status: DC
  Administered 2021-11-25 – 2021-11-29 (×5): 1 [drp] via OPHTHALMIC
  Filled 2021-11-24 (×2): qty 5

## 2021-11-24 MED ORDER — ESCITALOPRAM OXALATE 10 MG PO TABS
20.0000 mg | ORAL_TABLET | Freq: Every day | ORAL | Status: DC
  Administered 2021-11-25 – 2021-11-29 (×5): 20 mg via ORAL
  Filled 2021-11-24 (×5): qty 2

## 2021-11-24 MED ORDER — OXYCODONE HCL ER 15 MG PO T12A
15.0000 mg | EXTENDED_RELEASE_TABLET | Freq: Two times a day (BID) | ORAL | Status: DC
  Administered 2021-11-24 – 2021-11-29 (×10): 15 mg via ORAL
  Filled 2021-11-24 (×10): qty 1

## 2021-11-24 MED ORDER — ACETAMINOPHEN 650 MG RE SUPP: 650.0000 mg | Freq: Four times a day (QID) | RECTAL | Status: DC | PRN

## 2021-11-24 MED ORDER — VANCOMYCIN HCL IN DEXTROSE 1-5 GM/200ML-% IV SOLN
1000.0000 mg | Freq: Once | INTRAVENOUS | Status: AC
  Administered 2021-11-24: 1000 mg via INTRAVENOUS
  Filled 2021-11-24: qty 200

## 2021-11-24 MED ORDER — SODIUM CHLORIDE 0.9 % IV SOLN
2.0000 g | Freq: Once | INTRAVENOUS | Status: AC
  Administered 2021-11-24: 2 g via INTRAVENOUS
  Filled 2021-11-24: qty 12.5

## 2021-11-24 MED ORDER — TRAZODONE HCL 50 MG PO TABS
25.0000 mg | ORAL_TABLET | Freq: Every evening | ORAL | Status: DC | PRN
  Administered 2021-11-26: 25 mg via ORAL
  Filled 2021-11-24 (×2): qty 1

## 2021-11-24 MED ORDER — SODIUM CHLORIDE 0.9 % IV SOLN: INTRAVENOUS | Status: DC

## 2021-11-24 MED ORDER — FLUTICASONE PROPIONATE 50 MCG/ACT NA SUSP
1.0000 | Freq: Every day | NASAL | Status: DC
  Administered 2021-11-25 – 2021-11-29 (×5): 1 via NASAL
  Filled 2021-11-24: qty 16

## 2021-11-24 MED ORDER — LIDOCAINE VISCOUS HCL 2 % MT SOLN
15.0000 mL | OROMUCOSAL | Status: DC | PRN
  Administered 2021-11-26: 15 mL via OROMUCOSAL
  Filled 2021-11-24 (×2): qty 15

## 2021-11-24 MED ORDER — SODIUM CHLORIDE 0.9% FLUSH
3.0000 mL | Freq: Two times a day (BID) | INTRAVENOUS | Status: DC
  Administered 2021-11-24 – 2021-11-29 (×10): 3 mL via INTRAVENOUS

## 2021-11-24 MED ORDER — GABAPENTIN 300 MG PO CAPS
300.0000 mg | ORAL_CAPSULE | Freq: Two times a day (BID) | ORAL | Status: DC
  Administered 2021-11-24 – 2021-11-25 (×2): 300 mg via ORAL
  Filled 2021-11-24 (×2): qty 1

## 2021-11-24 MED ORDER — MORPHINE SULFATE (PF) 2 MG/ML IV SOLN: 1.0000 mg | Freq: Four times a day (QID) | INTRAVENOUS | Status: DC | PRN

## 2021-11-24 MED ORDER — LACTATED RINGERS IV BOLUS (SEPSIS)
500.0000 mL | Freq: Once | INTRAVENOUS | Status: AC
  Administered 2021-11-24: 500 mL via INTRAVENOUS

## 2021-11-24 MED ORDER — ONDANSETRON HCL 4 MG/2ML IJ SOLN: 4.0000 mg | Freq: Four times a day (QID) | INTRAMUSCULAR | Status: DC | PRN

## 2021-11-24 MED ORDER — MOMETASONE FUROATE 0.1 % EX CREA: 1.0000 | TOPICAL_CREAM | Freq: Every day | CUTANEOUS | Status: DC | PRN

## 2021-11-24 MED ORDER — VITAMIN B-12 1000 MCG PO TABS
2000.0000 ug | ORAL_TABLET | Freq: Every day | ORAL | Status: DC
  Administered 2021-11-25 – 2021-11-29 (×5): 2000 ug via ORAL
  Filled 2021-11-24 (×5): qty 2

## 2021-11-24 MED ORDER — POTASSIUM CHLORIDE CRYS ER 10 MEQ PO TBCR
10.0000 meq | EXTENDED_RELEASE_TABLET | Freq: Three times a day (TID) | ORAL | Status: DC
  Administered 2021-11-24 – 2021-11-29 (×14): 10 meq via ORAL
  Filled 2021-11-24 (×14): qty 1

## 2021-11-24 MED ORDER — ENOXAPARIN SODIUM 40 MG/0.4ML IJ SOSY
40.0000 mg | PREFILLED_SYRINGE | INTRAMUSCULAR | Status: DC
  Administered 2021-11-24 – 2021-11-28 (×5): 40 mg via SUBCUTANEOUS
  Filled 2021-11-24 (×5): qty 0.4

## 2021-11-24 MED ORDER — LATANOPROST 0.005 % OP SOLN
1.0000 [drp] | Freq: Every day | OPHTHALMIC | Status: DC
  Administered 2021-11-24 – 2021-11-28 (×5): 1 [drp] via OPHTHALMIC
  Filled 2021-11-24: qty 2.5

## 2021-11-24 MED ORDER — OXYCODONE HCL 5 MG PO TABS
5.0000 mg | ORAL_TABLET | ORAL | Status: DC | PRN
  Administered 2021-11-26 – 2021-11-29 (×5): 5 mg via ORAL
  Filled 2021-11-24 (×5): qty 1

## 2021-11-24 MED ORDER — RISAQUAD PO CAPS
1.0000 | ORAL_CAPSULE | Freq: Every day | ORAL | Status: DC
  Administered 2021-11-25 – 2021-11-29 (×5): 1 via ORAL
  Filled 2021-11-24 (×5): qty 1

## 2021-11-24 MED ORDER — ACETAMINOPHEN 325 MG PO TABS
650.0000 mg | ORAL_TABLET | Freq: Four times a day (QID) | ORAL | Status: DC | PRN
  Administered 2021-11-27 – 2021-11-28 (×2): 650 mg via ORAL
  Filled 2021-11-24 (×2): qty 2

## 2021-11-24 MED ORDER — ACETAMINOPHEN 325 MG PO TABS
650.0000 mg | ORAL_TABLET | Freq: Once | ORAL | Status: AC
  Administered 2021-11-24: 650 mg via ORAL
  Filled 2021-11-24: qty 2

## 2021-11-24 MED ORDER — PANTOPRAZOLE SODIUM 40 MG PO TBEC
40.0000 mg | DELAYED_RELEASE_TABLET | Freq: Every day | ORAL | Status: DC
  Administered 2021-11-25 – 2021-11-29 (×5): 40 mg via ORAL
  Filled 2021-11-24 (×5): qty 1

## 2021-11-24 MED ORDER — LOSARTAN POTASSIUM 50 MG PO TABS
50.0000 mg | ORAL_TABLET | Freq: Every day | ORAL | Status: DC
  Administered 2021-11-25 – 2021-11-29 (×5): 50 mg via ORAL
  Filled 2021-11-24 (×5): qty 1

## 2021-11-24 NOTE — H&P (Signed)
History and Physical   [facility] - [place] @ [facility] Admission History and Physical [employer], D.O.    Patient Name: [name] [name] MR#: [id number] Date of Birth: 05/19/1925 Date of Admission: 11/24/2021  Referring MD/NP/PA: Dr. Cherylann Banas Primary Care Physician: Kirk Ruths, MD  Chief Complaint:  Chief Complaint  Patient presents with   Wound Infection    8    HPI: [name] [name] is a 88 y.o. 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  Patient was in a usual state of health until 2 weeks ago when she developed bilateral lower extremity redness swelling and wounds with serosanguineous drainage.  She was started on Keflex 2 days ago however the symptoms have progressively worsened..  She has a history of developing what she describes as blisters that then pop and become sores all over her body, inside her mouth, breasts, hands, and legs.    Patient denies fevers/chills, weakness, dizziness, chest pain, shortness of breath, N/V/C/D, abdominal pain, dysuria/frequency, changes in mental status.    Otherwise there has been no change in status. Patient has been taking medication as prescribed and there has been no recent change in medication or diet.  There has been no recent illness, hospitalizations, travel or sick contacts.    EMS/ED Course: Patient received cefepime, vancomycin, lactated Ringer's and Tylenol. Medical admission has been requested for further management of sepsis secondary to bilateral lower extremity cellulitis.  Review of Systems:  CONSTITUTIONAL: No fever/chills, fatigue, weakness, weight gain/loss, headache. EYES: No blurry or double vision. ENT: No tinnitus, postnasal drip, redness or soreness of the oropharynx. RESPIRATORY: No  cough, dyspnea, wheeze.  No hemoptysis.  CARDIOVASCULAR: No chest pain, palpitations, syncope, orthopnea. No lower extremity edema.  GASTROINTESTINAL: No nausea, vomiting, abdominal pain, diarrhea, constipation.  No hematemesis, melena or hematochezia. GENITOURINARY: No dysuria, frequency, hematuria. ENDOCRINE: No polyuria or nocturia. No heat or cold intolerance. HEMATOLOGY: No anemia, bruising, bleeding. INTEGUMENTARY: Positive redness, swelling, pain and drainage from bilateral lower extremities no rashes, ulcers, lesions. MUSCULOSKELETAL: No arthritis, gout. NEUROLOGIC: No numbness, tingling, ataxia, seizure-type activity, weakness. PSYCHIATRIC: No anxiety, depression, insomnia.   Past Medical History:  Diagnosis Date   Actinic keratosis    Anemia    COPD (chronic obstructive pulmonary disease) (HCC)    Diverticulitis    GERD (gastroesophageal reflux disease)    Glaucoma    HLD (hyperlipidemia)    Hypertension    Iron deficiency    Rectal bleeding    Skin cancer    Nose, txted by Dr. Valere Dross at [facility]    Past Surgical History:  Procedure Laterality Date   ABDOMINAL HYSTERECTOMY     APPENDECTOMY     COLONOSCOPY WITH PROPOFOL N/A 04/14/2016   Procedure: COLONOSCOPY WITH PROPOFOL;  Surgeon: Lucilla Lame, MD;  Location: [facility] ENDOSCOPY;  Service: Endoscopy;  Laterality: N/A;   ENTEROSCOPY N/A 05/10/2016   Procedure: Push ENTEROSCOPY with pediatric colonoscope;  Surgeon: [name], MD;  Location: [facility] ENDOSCOPY;  Service: Endoscopy;  Laterality: N/A;   ENTEROSCOPY N/A 05/30/2016   Procedure: push ENTEROSCOPY;  Surgeon: [name], MD;  Location: [facility] ENDOSCOPY;  Service: Endoscopy;  Laterality: N/A;   ENTEROSCOPY N/A 07/05/2017   Procedure: ENTEROSCOPY;  Surgeon: [name], MD;  Location: [facility] ENDOSCOPY;  Service: Gastroenterology;  Laterality: N/A;   ENTEROSCOPY N/A 06/21/2018   Procedure: ENTEROSCOPY;  Surgeon: [name], MD;  Location: [facility] ENDOSCOPY;  Service: Gastroenterology;   Laterality: N/A;   ESOPHAGOGASTRODUODENOSCOPY (EGD) WITH PROPOFOL N/A 08/12/2015   Procedure: ESOPHAGOGASTRODUODENOSCOPY (EGD) WITH PROPOFOL;  Surgeon: Lollie Sails, MD;  Location: [facility] ENDOSCOPY;  Service: Endoscopy;  Laterality: N/A;   ESOPHAGOGASTRODUODENOSCOPY (EGD) WITH PROPOFOL N/A 04/03/2016   Procedure: ESOPHAGOGASTRODUODENOSCOPY (EGD) WITH PROPOFOL;  Surgeon: Lucilla Lame, MD;  Location: [facility] ENDOSCOPY;  Service: Endoscopy;  Laterality: N/A;   GIVENS CAPSULE STUDY N/A 04/28/2016   Procedure: GIVENS CAPSULE STUDY;  Surgeon: [name], MD;  Location: [facility] ENDOSCOPY;  Service: Endoscopy;  Laterality: N/A;   GIVENS CAPSULE STUDY N/A 06/21/2017   Procedure: GIVENS CAPSULE STUDY;  Surgeon: [name], MD;  Location: [facility] ENDOSCOPY;  Service: Gastroenterology;  Laterality: N/A;   JOINT REPLACEMENT       reports that she quit smoking about 42 years ago. Her smoking use included cigarettes. She has a 40.00 pack-year smoking history. She has never used smokeless tobacco. She reports that she does not currently use alcohol after a past usage of about 1.0 standard drink of alcohol per week. She reports that she does not use drugs.  Allergies  Allergen Reactions   Nsaids Hives   Dextrans Hives   Fentanyl Itching   Lipitor [Atorvastatin] Other (See Comments)    Reaction: Muscle pain   Lovastatin    Mobic [Meloxicam] Other (See Comments)    Reaction: Mouth and tongue ulcers   Niacin And Related Itching   Other    Polysaccharide K Hives   Pravachol [Pravastatin Sodium] Other (See Comments)    Reaction: Muscle pain   Statins Other (See Comments)   Tolmetin Hives   Voltaren [Diclofenac Sodium] Other (See Comments)    Reaction: Mouth and tongue ulcers   Zetia [Ezetimibe] Other (See Comments)    Reaction: Leg pain   Codeine Nausea And Vomiting   Niacin Itching    Family History  Problem Relation Age of Onset   CAD Mother    CAD Father    CAD Brother     Prior to Admission  medications   Medication Sig Start Date End Date Taking? Authorizing Provider  acidophilus (RISAQUAD) CAPS capsule Take 1 capsule by mouth daily. Patient taking differently: Take 1 capsule by mouth in the morning and at bedtime. 11/08/19  Yes Bonnielee Haff, MD  cephALEXin (KEFLEX) 500 MG capsule Take 500 mg by mouth 3 (three) times daily. 11/22/21 11/29/21 Yes [provider]  Cyanocobalamin (B-12) 1000 MCG CAPS Take 2,000 Units by mouth daily.    Yes [provider]  escitalopram (LEXAPRO) 20 MG tablet Take 20 mg by mouth daily.   Yes [provider]  fluconazole (DIFLUCAN) 150 MG tablet Take 150 mg by mouth once. 10/20/21  Yes [provider]  gabapentin (NEURONTIN) 300 MG capsule Take 300 mg by mouth 2 (two) times daily.   Yes [provider]  latanoprost (XALATAN) 0.005 % ophthalmic solution Place 1 drop into both eyes at bedtime.   Yes [provider]  lidocaine (XYLOCAINE) 2 % solution Use as directed 15 mLs in the mouth or throat as needed for mouth pain.   Yes [provider]  losartan (COZAAR) 50 MG tablet Take 50 mg by mouth daily.   Yes [provider]  Multiple Vitamins-Minerals (OCUVITE PRESERVISION PO) Take 1 tablet by mouth 2 (two) times daily.   Yes [provider]  oxyCODONE (OXY IR/ROXICODONE) 5 MG immediate release tablet Take 5 mg by mouth  every 4 (four) hours as needed.   Yes [provider]  OXYCONTIN 15 MG 12 hr tablet Take 15 mg by mouth every 12 (twelve) hours. 11/15/21  Yes [provider]  potassium chloride (KLOR-CON) 10 MEQ tablet Take 10 mEq by mouth 3 (three) times daily. 10/19/19  Yes [provider]  RABEprazole (ACIPHEX) 20 MG tablet Take 20 mg by mouth 2 (two) times daily.   Yes [provider]  timolol (TIMOPTIC) 0.5 % ophthalmic solution Place 1 drop into both eyes daily. 11/23/21  Yes [provider]  acetaminophen (TYLENOL) 500 MG  tablet Take 500-1,000 mg by mouth every 6 (six) hours as needed for mild pain, moderate pain or fever.    [provider]  fluticasone (FLONASE) 50 MCG/ACT nasal spray Place into both nostrils.    [provider]  hydrochlorothiazide (HYDRODIURIL) 12.5 MG tablet Take 12.5 mg by mouth daily. 11/18/19   [provider]  ipratropium-albuterol (DUONEB) 0.5-2.5 (3) MG/3ML SOLN Inhale 3 mLs into the lungs 2 (two) times daily. 04/05/20 03/31/21  [provider]  mometasone (ELOCON) 0.1 % lotion Apply 1 application topically daily as needed (skin irritation).    [provider]  ondansetron (ZOFRAN-ODT) 4 MG disintegrating tablet Take 4 mg by mouth every 6 (six) hours as needed.    [provider]    Physical Exam: Vitals:   11/24/21 1636 11/24/21 1700 11/24/21 1800 11/24/21 1830  BP:  133/61 (!) 131/58 (!) 128/56  Pulse:  88 86 91  Resp:  15 19 (!) 21  Temp: (!) 101.1 F (38.4 C)     TempSrc: Rectal     SpO2:  92% 92% 95%  Weight:      Height:        GENERAL: 88 y.o.-year-old white female patient, well-developed, well-nourished lying in the bed in no acute distress.  Pleasant and cooperative.   HEENT: Head atraumatic, normocephalic. Pupils equal. Mucus membranes moist. NECK: Supple. No JVD. CHEST: Normal breath sounds bilaterally. No wheezing, rales, rhonchi or crackles. No use of accessory muscles of respiration.  No reproducible chest wall tenderness.  CARDIOVASCULAR: S1, S2 normal. No murmurs, rubs, or gallops. Cap refill <2 seconds. Pulses intact distally.  ABDOMEN: Soft, nondistended, nontender. No rebound, guarding, rigidity. Normoactive bowel sounds present in all four quadrants.  EXTREMITIES: Bilateral foot swelling, warmth, redness with purplish discoloration, severe open blisters on medial aspect of left foot.  Right hand with tender nodule at first PIP joint.   NEUROLOGIC: The patient is alert and oriented x 3. Cranial nerves II  through XII are grossly intact with no focal sensorimotor deficit. PSYCHIATRIC:  Normal affect, mood, thought content.    Labs on Admission:  CBC: Recent Labs  Lab 11/24/21 1633  WBC 16.2*  NEUTROABS 13.2*  HGB 10.1*  HCT 30.9*  MCV 80.5  PLT 163*   Basic Metabolic Panel: Recent Labs  Lab 11/24/21 1633  NA 130*  K 4.5  CL 97*  CO2 24  GLUCOSE 107*  BUN 24*  CREATININE 0.58  CALCIUM 8.9   GFR: Estimated Creatinine Clearance: 41.8 mL/min (by C-G formula based on SCr of 0.58 mg/dL). Liver Function Tests: Recent Labs  Lab 11/24/21 1633  AST 19  ALT 9  ALKPHOS 61  BILITOT 0.6  PROT 7.2  ALBUMIN 3.0*   No results for input(s): "LIPASE", "AMYLASE" in the last 168 hours. No results for input(s): "AMMONIA" in the last 168 hours. Coagulation Profile: Recent Labs  Lab 11/24/21  1633  INR 1.1   Cardiac Enzymes: No results for input(s): "CKTOTAL", "CKMB", "CKMBINDEX", "TROPONINI" in the last 168 hours. BNP (last 3 results) No results for input(s): "PROBNP" in the last 8760 hours. HbA1C: No results for input(s): "HGBA1C" in the last 72 hours. CBG: No results for input(s): "GLUCAP" in the last 168 hours. Lipid Profile: No results for input(s): "CHOL", "HDL", "LDLCALC", "TRIG", "CHOLHDL", "LDLDIRECT" in the last 72 hours. Thyroid Function Tests: No results for input(s): "TSH", "T4TOTAL", "FREET4", "T3FREE", "THYROIDAB" in the last 72 hours. Anemia Panel: No results for input(s): "VITAMINB12", "FOLATE", "FERRITIN", "TIBC", "IRON", "RETICCTPCT" in the last 72 hours. Urine analysis:    Component Value Date/Time   COLORURINE STRAW (A) 05/19/2021 1543   APPEARANCEUR CLEAR (A) 05/19/2021 1543   LABSPEC 1.019 05/19/2021 1543   PHURINE 8.0 05/19/2021 1543   GLUCOSEU NEGATIVE 05/19/2021 1543   HGBUR NEGATIVE 05/19/2021 1543   BILIRUBINUR NEGATIVE 05/19/2021 1543   KETONESUR 5 (A) 05/19/2021 1543   PROTEINUR NEGATIVE 05/19/2021 1543   NITRITE NEGATIVE 05/19/2021 1543    LEUKOCYTESUR NEGATIVE 05/19/2021 1543   Sepsis Labs: '@LABRCNTIP'$ (procalcitonin:4,lacticidven:4) ) Recent Results (from the past 240 hour(s))  Resp Panel by RT-PCR (Flu A&B, Covid) Anterior Nasal Swab     Status: None   Collection Time: 11/24/21  6:10 PM   Specimen: Anterior Nasal Swab  Result Value Ref Range Status   SARS Coronavirus 2 by RT PCR NEGATIVE NEGATIVE Final    Comment: (NOTE) SARS-CoV-2 target nucleic acids are NOT DETECTED.  The SARS-CoV-2 RNA is generally detectable in upper respiratory specimens during the acute phase of infection. The lowest concentration of SARS-CoV-2 viral copies this assay can detect is 138 copies/mL. A negative result does not preclude SARS-Cov-2 infection and should not be used as the sole basis for treatment or other patient management decisions. A negative result may occur with  improper specimen collection/handling, submission of specimen other than nasopharyngeal swab, presence of viral mutation(s) within the areas targeted by this assay, and inadequate number of viral copies(<138 copies/mL). A negative result must be combined with clinical observations, patient history, and epidemiological information. The expected result is Negative.  Fact Sheet for Patients:  EntrepreneurPulse.com.au  Fact Sheet for Healthcare Providers:  IncredibleEmployment.be  This test is no t yet approved or cleared by the [place] FDA and  has been authorized for detection and/or diagnosis of SARS-CoV-2 by FDA under an Emergency Use Authorization (EUA). This EUA will remain  in effect (meaning this test can be used) for the duration of the COVID-19 declaration under Section 564(b)(1) of the Act, 21 U.S.C.section 360bbb-3(b)(1), unless the authorization is terminated  or revoked sooner.       Influenza A by PCR NEGATIVE NEGATIVE Final   Influenza B by PCR NEGATIVE NEGATIVE Final    Comment: (NOTE) The Xpert Xpress  SARS-CoV-2/FLU/RSV plus assay is intended as an aid in the diagnosis of influenza from Nasopharyngeal swab specimens and should not be used as a sole basis for treatment. Nasal washings and aspirates are unacceptable for Xpert Xpress SARS-CoV-2/FLU/RSV testing.  Fact Sheet for Patients: EntrepreneurPulse.com.au  Fact Sheet for Healthcare Providers: IncredibleEmployment.be  This test is not yet approved or cleared by the [place] FDA and has been authorized for detection and/or diagnosis of SARS-CoV-2 by FDA under an Emergency Use Authorization (EUA). This EUA will remain in effect (meaning this test can be used) for the duration of the COVID-19 declaration under Section 564(b)(1) of the Act, 21 U.S.C. section 360bbb-3(b)(1), unless  the authorization is terminated or revoked.  Performed at [facility], [address] [place], [place] [address]      Radiological Exams on Admission: DG Foot 2 Views Left  Result Date: 11/24/2021 CLINICAL DATA:  Cellulitis. Bilateral foot redness, swelling, and serous drainage starting 2 weeks ago. On oral Keflex for 2 days. Numerous abrasions, wounds, and skin breakdown within the bilateral feet. EXAM: LEFT FOOT - 2 VIEW COMPARISON:  None Available. FINDINGS: Moderate to high-grade hallux valgus. Moderate great toe metatarsophalangeal joint space narrowing with mild peripheral degenerative osteophytes. Mild-to-moderate interphalangeal joint space narrowing diffusely. Moderate medial first tarsometatarsal joint space narrowing. Moderate peripheral lateral anterior process of the calcaneus degenerative osteophytosis at the calcaneocuboid joint. Mild midfoot and forefoot soft tissue swelling. There are lucencies suspicious for edema and possibly air within the lateral ankle soft tissues, best seen on frontal view. There is an ulceration of the posterior heel soft tissues apparently contacting the calcaneal  heel spur at the Achilles tendon insertion. No definite cortical erosion. Small plantar calcaneal heel spur. No acute fracture or dislocation. IMPRESSION: 1. Posterior heel soft tissue ulceration coming close to a posterior calcaneal heel spur at the Achilles tendon insertion. No definite cortical erosion to suggest osteomyelitis. 2. There are low densities suspicious for edema and fluid within the lateral ankle soft tissues, best seen on frontal view. This is suspicious for cellulitis. 3. Moderate to high-grade hallux valgus. Moderate great toe metatarsophalangeal joint osteoarthritis. Electronically Signed   By: Yvonne Kendall M.D.   On: 11/24/2021 17:52   DG Foot 2 Views Right  Result Date: 11/24/2021 CLINICAL DATA:  Cellulitis. Bilateral foot redness, swelling, and serous drainage starting 2 weeks ago. On oral Keflex for 2 days. Numerous abrasions, wounds, and skin breakdown within the bilateral feet. EXAM: RIGHT FOOT - 2 VIEW COMPARISON:  None Available. FINDINGS: Two screws are seen within the proximal half of the great toe metatarsal likely status post osteotomy. Mild hallux valgus. Mild great toe metatarsophalangeal joint space narrowing. Moderate lateral great toe interphalangeal joint space narrowing. Osseous fusion of the third PIP joint. Moderate joint space narrowing of the rest of the second through fifth interphalangeal joints. Small plantar calcaneal heel spur. Mild dorsal midfoot degenerative osteophytes. Moderate midfoot and forefoot soft tissue swelling. No cortical erosion is seen. No subcutaneous emphysema. IMPRESSION: 1. Moderate midfoot and forefoot soft tissue swelling. No radiographic evidence of osteomyelitis. 2. Mild hallux valgus and great toe metatarsophalangeal joint osteoarthritis. Electronically Signed   By: Yvonne Kendall M.D.   On: 11/24/2021 17:47   DG Chest Port 1 View  Result Date: 11/24/2021 CLINICAL DATA:  Sepsis EXAM: PORTABLE CHEST 1 VIEW COMPARISON:  Radiograph  11/04/2019 FINDINGS: Unchanged cardiomediastinal silhouette. Faint right mid lung and lingular opacities. No pleural effusion or pneumothorax. No acute osseous abnormality. Thoracic spondylosis. IMPRESSION: Faint right mid lung and lingular opacities, possibly developing infection or atelectasis. PA and lateral chest radiograph would be useful, if able. Electronically Signed   By: Maurine Simmering M.D.   On: 11/24/2021 16:48    EKG: Normal sinus rhythm at 89 bpm with normal axis and nonspecific ST-T wave changes.   Assessment/Plan  This is a 88 y.o. 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 Sepsis secondary to bilateral lower extremity cellulitis - Admit to inpatient with telemetry monitoring - IV antibiotics: Cefepime, Vanco - IV fluid hydration - Follow up  blood,urine & sputum cultures - Repeat CBC in am.   #.  History of hypertension - Continue losartan  #.  History of GERD - Continue Protonix  #. History of COPD - Continue Flonase, O2 as needed, nebs as needed  #. History of chronic pain - Continue oxycodone, OxyContin, gabapentin  #. History of depression - Continue Lexapro  Admission status: Inpatient IV Fluids: Normal saline Diet/Nutrition: Heart healthy Consults called: None DVT Px: Lovenox and early ambulation. Code Status: Full Code  Disposition Plan: To home in 1-2 days  All the records are reviewed and case discussed with ED provider. Management plans discussed with the patient and/or family who express understanding and agree with plan of care.  Abdur Hoglund D.O. on 11/24/2021 at 7:10 PM CC: Primary care physician; Kirk Ruths, MD   11/24/2021, 7:10 PM

## 2021-11-24 NOTE — ED Triage Notes (Signed)
Pt from [facility] assisted living. Pt is legally blind and HOH. Pt with jaw CA and getting treatment of chemo and radiation. Pt with bilateral redness, swelling, serous drainage on feet that started 2 weeks ago. Pt on oral Keflex since this past Tuesday. Pt states pain and redness and swelling has increased since then. Pt with  numerous abrasion, wounds, and skin breakdown on both feet. Feet hot to touch. Pt alert and oriented x 4.

## 2021-11-24 NOTE — ED Notes (Signed)
Request made for transport to the floor ?

## 2021-11-24 NOTE — ED Provider Notes (Signed)
[facility] Provider Note    Event Date/Time   First MD Initiated Contact with Patient 11/24/21 1622     (approximate)   History   Wound Infection (8)   HPI  [name] [name] is a 88 y.o. female with a history of hypertension, hyperlipidemia, COPD, GERD, iron deficiency anemia, and head and neck cancer who presents with redness and swelling to both feet.  She has had some swelling over the last week and started to develop some redness.  She saw her doctor 2 days ago and was started on Keflex but the redness and swelling have increased in progress.  The patient denies cough, shortness of breath, vomiting or diarrhea.  I reviewed the past medical records.  The patient saw PA Tumey on 11/14 for swelling and redness to both feet along with bullous lesions and drainage.  She was diagnosed with cellulitis and started on Keflex.   Physical Exam   Triage Vital Signs: ED Triage Vitals  Enc Vitals Group     BP 11/24/21 1619 (!) 154/69     Pulse Rate 11/24/21 1619 87     Resp 11/24/21 1619 20     Temp 11/24/21 1619 100.3 F (37.9 C)     Temp Source 11/24/21 1619 Axillary     SpO2 11/24/21 1619 93 %     Weight 11/24/21 1625 149 lb (67.6 kg)     Height 11/24/21 1625 '5\' 7"'$  (1.702 m)     Head Circumference --      Peak Flow --      Pain Score 11/24/21 1624 10     Pain Loc --      Pain Edu? --      Excl. in [place]? --     Most recent vital signs: Vitals:   11/24/21 1636 11/24/21 1700  BP:  133/61  Pulse:  88  Resp:  15  Temp: (!) 101.1 F (38.4 C)   SpO2:  92%     General: Awake, comfortable appearing, no distress.  CV:  Good peripheral perfusion.  Resp:  Normal effort.  Lungs CTAB. Abd:  Soft and nontender.  No distention.  Other:  Bilateral distal lower exam feet with 2+ pitting edema with several areas of bullae but no open wounds or ulcers.  Both feet diffusely erythematous, indurated, and warm.  No fluctuance.   ED Results / Procedures /  Treatments   Labs (all labs ordered are listed, but only abnormal results are displayed) Labs Reviewed  COMPREHENSIVE METABOLIC PANEL - Abnormal; Notable for the following components:      Result Value   Sodium 130 (*)    Chloride 97 (*)    Glucose, Bld 107 (*)    BUN 24 (*)    Albumin 3.0 (*)    All other components within normal limits  CBC WITH DIFFERENTIAL/PLATELET - Abnormal; Notable for the following components:   WBC 16.2 (*)    RBC 3.84 (*)    Hemoglobin 10.1 (*)    HCT 30.9 (*)    Platelets 448 (*)    Neutro Abs 13.2 (*)    Monocytes Absolute 2.0 (*)    All other components within normal limits  CULTURE, BLOOD (ROUTINE X 2)  CULTURE, BLOOD (ROUTINE X 2)  RESP PANEL BY RT-PCR (FLU A&B, COVID) [id number]  LACTIC ACID, PLASMA  PROTIME-INR  LACTIC ACID, PLASMA  URINALYSIS, ROUTINE W REFLEX MICROSCOPIC  URINALYSIS, COMPLETE (UACMP) WITH MICROSCOPIC     EKG  ED ECG REPORT I, Arta Silence, the attending physician, personally viewed and interpreted this ECG.  Date: 11/24/2021 EKG Time: 1634 Rate: 89 Rhythm: normal sinus rhythm QRS Axis: normal Intervals: normal ST/T Wave abnormalities: Nonspecific T wave abnormalities Narrative Interpretation: Nonspecific abnormalities with no evidence of acute ischemia    RADIOLOGY  XR L foot/R foot: I independently viewed and interpreted the images; there are no bony lesions suggestive of osteomyelitis.  There is soft tissue edema.  Chest x-ray: I independently viewed and interpreted the images; there are questionable right lung opacities but no focal consolidation or edema   PROCEDURES:  Critical Care performed: No  Procedures   MEDICATIONS ORDERED IN ED: Medications  lactated ringers bolus 500 mL (500 mLs Intravenous New Bag/Given 11/24/21 1735)  vancomycin (VANCOCIN) IVPB 1000 mg/200 mL premix (has no administration in time range)  acetaminophen (TYLENOL) tablet 650 mg (650 mg Oral Given 11/24/21 1736)   ceFEPIme (MAXIPIME) 2 g in sodium chloride 0.9 % 100 mL IVPB (2 g Intravenous New Bag/Given 11/24/21 1745)     IMPRESSION / MDM / [place] / ED COURSE  I reviewed the triage vital signs and the nursing notes.  88 year old female with PMH as noted above presents with redness and swelling to bilateral legs.  She was diagnosed with cellulitis a few days ago and started on Keflex but the symptoms have worsened.  The patient is febrile with otherwise normal vital signs and physical exam reveals some bullae and swelling to bilateral legs along with erythema and induration but no large or deep open wounds.  Differential diagnosis includes, but is not limited to, cellulitis, less likely osteomyelitis or deep soft tissue infection, dependent edema.  We will obtain lab work-up, give empiric antibiotics and Tylenol, and reassess.  I anticipate that the patient will need admission due to failure of outpatient therapy.  Patient's presentation is most consistent with acute presentation with potential threat to life or bodily function.  The patient is on the cardiac monitor to evaluate for evidence of arrhythmia and/or significant heart rate changes.  ----------------------------------------- 6:29 PM on 11/24/2021 -----------------------------------------  Lab work-up reveals leukocytosis but a normal lactate.  Chemistry is reassuring and there is no AKI.  X-rays do not show signs of osteomyelitis.  I have ordered broad-spectrum antibiotics to cover both for cellulitis and for possible pneumonia although the patient clinically does not have evidence of pneumonia and the x-ray findings are not clear.  I consulted Dr. Ara Kussmaul from the hospitalist service; based on our discussion she agrees to admit the patient.   FINAL CLINICAL IMPRESSION(S) / ED DIAGNOSES   Final diagnoses:  Cellulitis of lower extremity, unspecified laterality     Rx / DC Orders   ED Discharge Orders     None         Note:  This document was prepared using Dragon voice recognition software and may include unintentional dictation errors.    Arta Silence, MD 11/24/21 [phone]

## 2021-11-24 NOTE — Code Documentation (Signed)
CODE SEPSIS - PHARMACY COMMUNICATION  **Broad Spectrum Antibiotics should be administered within 1 hour of Sepsis diagnosis**  Time Code Sepsis Called/Page Received: 1712  Antibiotics Ordered: cefepime + vancomycin  Time of 1st antibiotic administration: [place] Kenise Barraco ,PharmD Clinical Pharmacist  11/24/2021  5:51 PM

## 2021-11-25 DIAGNOSIS — L03115 Cellulitis of right lower limb: Secondary | ICD-10-CM | POA: Diagnosis not present

## 2021-11-25 DIAGNOSIS — L03116 Cellulitis of left lower limb: Secondary | ICD-10-CM | POA: Diagnosis not present

## 2021-11-25 DIAGNOSIS — B029 Zoster without complications: Secondary | ICD-10-CM | POA: Diagnosis not present

## 2021-11-25 LAB — COMPREHENSIVE METABOLIC PANEL
ALT: 9 U/L (ref 0–44)
AST: 14 U/L — ABNORMAL LOW (ref 15–41)
Albumin: 2.4 g/dL — ABNORMAL LOW (ref 3.5–5.0)
Alkaline Phosphatase: 51 U/L (ref 38–126)
Anion gap: 5 (ref 5–15)
BUN: 19 mg/dL (ref 8–23)
CO2: 25 mmol/L (ref 22–32)
Calcium: 8.5 mg/dL — ABNORMAL LOW (ref 8.9–10.3)
Chloride: 102 mmol/L (ref 98–111)
Creatinine, Ser: 0.44 mg/dL (ref 0.44–1.00)
GFR, Estimated: >60 mL/min (ref >60)
Glucose, Bld: 89 mg/dL (ref 70–99)
Potassium: 3.7 mmol/L (ref 3.5–5.1)
Sodium: 132 mmol/L — ABNORMAL LOW (ref 135–145)
Total Bilirubin: 0.7 mg/dL (ref 0.3–1.2)
Total Protein: 5.9 g/dL — ABNORMAL LOW (ref 6.5–8.1)

## 2021-11-25 LAB — CBC
HCT: 25.1 % — ABNORMAL LOW (ref 36.0–46.0)
Hemoglobin: 8.3 g/dL — ABNORMAL LOW (ref 12.0–15.0)
MCH: 26.3 pg (ref 26.0–34.0)
MCHC: 33.1 g/dL (ref 30.0–36.0)
MCV: 79.7 fL — ABNORMAL LOW (ref 80.0–100.0)
Platelets: 367 10*3/uL (ref 150–400)
RBC: 3.15 MIL/uL — ABNORMAL LOW (ref 3.87–5.11)
RDW: 14.6 % (ref 11.5–15.5)
WBC: 8.4 10*3/uL (ref 4.0–10.5)
nRBC: 0 % (ref 0.0–0.2)

## 2021-11-25 MED ORDER — VANCOMYCIN HCL IN DEXTROSE 1-5 GM/200ML-% IV SOLN
1000.0000 mg | INTRAVENOUS | Status: DC
  Administered 2021-11-25 – 2021-11-27 (×3): 1000 mg via INTRAVENOUS
  Filled 2021-11-25 (×3): qty 200

## 2021-11-25 MED ORDER — SODIUM CHLORIDE 0.9 % IV SOLN
2.0000 g | Freq: Two times a day (BID) | INTRAVENOUS | Status: DC
  Administered 2021-11-25 – 2021-11-29 (×9): 2 g via INTRAVENOUS
  Filled 2021-11-25 (×6): qty 12.5
  Filled 2021-11-25: qty 2
  Filled 2021-11-25 (×3): qty 12.5

## 2021-11-25 MED ORDER — SODIUM CHLORIDE 0.9 % IV SOLN
300.0000 mg | Freq: Once | INTRAVENOUS | Status: AC
  Administered 2021-11-25: 300 mg via INTRAVENOUS
  Filled 2021-11-25: qty 300

## 2021-11-25 MED ORDER — LIDOCAINE-PRILOCAINE 2.5-2.5 % EX CREA: TOPICAL_CREAM | CUTANEOUS | Status: DC | PRN

## 2021-11-25 MED ORDER — VALACYCLOVIR HCL 500 MG PO TABS
500.0000 mg | ORAL_TABLET | Freq: Two times a day (BID) | ORAL | Status: DC
  Administered 2021-11-25 – 2021-11-29 (×9): 500 mg via ORAL
  Filled 2021-11-25 (×11): qty 1

## 2021-11-25 MED ORDER — GABAPENTIN 400 MG PO CAPS
400.0000 mg | ORAL_CAPSULE | Freq: Three times a day (TID) | ORAL | Status: DC
  Administered 2021-11-25 – 2021-11-26 (×3): 400 mg via ORAL
  Filled 2021-11-25 (×3): qty 1

## 2021-11-25 NOTE — Progress Notes (Signed)
Patient lives in [place]. She is alert and oriented. States that she has not been able to walk due to bilateral legs/foot cellulitis. Feet are reddened, swollen with blisters. Also voiced having buccal sores due to chemotherapy. Has a scab on top lip from what looks to be a healing sore. She does pivot unsteadily to [facility]. Has hearing aids in and does wear glasses. States that son in law is over her affairs because her daughter died a few years ago. Spoke with patient's son Shanon Brow this morning and gave update with room number. Patient denied pain.

## 2021-11-25 NOTE — Progress Notes (Addendum)
PROGRESS NOTE    [name] [name]   QBH:[id number] DOB: May 01, 1925  DOA: 11/24/2021 Date of Service: 11/25/21 PCP: Kirk Ruths, MD     Brief Narrative / Hospital Course:  [name] [name] is a 88 y.o. 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  Patient was in a usual state of health until 2 weeks ago when she developed bilateral lower extremity redness swelling and wounds with serosanguineous drainage.  She was started on Keflex 2 days ago however the symptoms have progressively worsened  11/16: In ED (+)fever 101.4, WBC 16.2, Hgb 10.2. Sodium 130. Patient received cefepime, vancomycin, lactated Ringer's and Tylenol. BCx collected. XR feet bilaterally - no radiographic evidence for osteomyelitis. Medical admission has been requested for further management of sepsis secondary to bilateral lower extremity cellulitis.  11/17: pt reports burning/needle-like pain L lower back, rash concerning for possible shingles, started Valtrex. Continuing abx for cellulitis. WBC improved. VSS.   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)   Sepsis secondary to bilateral lower extremity cellulitis Admit to inpatient with telemetry monitoring IV antibiotics: Cefepime, Vanco IV fluid  hydration Follow up blood,urine & sputum cultures Follow CBC    Rash concerning for Shingles Valtrex Increased gabapentin   History of hypertension Continue losartan   History of GERD Continue Protonix   History of COPD Continue Flonase, O2 as needed, nebs as needed   History of chronic pain Continue oxycodone, OxyContin, gabapentin Increased gabapentin d/t concern for shingles  Lidocaine to rash on back    History of depression Continue Lexapro   DVT prophylaxis: enoxaparin  Pertinent IV fluids/nutrition: d/c NS today  Central lines / invasive devices: none  Code Status: FULL CODE  Family Communication: son in law at bedside on rounds   Disposition: inpatient  TOC needs: pending PT/OT - from assisted living, may need SNF/HH on discharge Barriers to discharge / significant pending items: awaiting cultures, requiring IV abx              Subjective:  Patient reports pain in feet and in L lower back - back feels like needles Denies CP/SOB.  Pain not controlled Denies new weakness.  Tolerating diet but low appetite Reports no concerns w/ urination/defecation.   Family Communication: son in law at bedside on rounds     Objective Findings:  Vitals:   11/24/21 2036 11/24/21 2327 11/25/21 0500 11/25/21 0737  BP: (!) 120/57 (!) 128/43  (!) 123/52  Pulse: 77 70  78  Resp: '16 16  16  '$ Temp: (!) 97.4 F (36.3 C) (!) 97.5 F (36.4 C) 98 F (36.7 C) 97.6 F (36.4 C)  TempSrc:   Axillary   SpO2: 97% 97%  95%  Weight:      Height:        Intake/Output Summary (Last 24 hours) at 11/25/2021 1354  Last data filed at 11/25/2021 0981 Gross per 24 hour  Intake 740 ml  Output 1 ml  Net 739 ml   Filed Weights   11/24/21 1625  Weight: 67.6 kg    Examination:  Constitutional:  VS as above General Appearance: alert, well-developed, well-nourished, NAD Neck: No masses, trachea midline Respiratory: Normal respiratory effort No wheeze No rhonchi No  rales Cardiovascular: S1/S2 normal RRR No rub/gallop auscultated No lower extremity edema Gastrointestinal: No tenderness Musculoskeletal:  No clubbing/cyanosis of digits Symmetrical movement in all extremities Neurological: No cranial nerve deficit on limited exam Alert Psychiatric: Normal judgment/insight Normal mood and affect             Scheduled Medications:   acidophilus  1 capsule Oral Daily   cyanocobalamin  2,000 mcg Oral Daily   enoxaparin (LOVENOX) injection  40 mg Subcutaneous Q24H   escitalopram  20 mg Oral Daily   fluticasone  1 spray Each Nare Daily   gabapentin  400 mg Oral TID   latanoprost  1 drop Both Eyes QHS   losartan  50 mg Oral Daily   oxyCODONE  15 mg Oral Q12H   pantoprazole  40 mg Oral Daily   potassium chloride  10 mEq Oral TID   sodium chloride flush  3 mL Intravenous Q12H   timolol  1 drop Both Eyes Daily   valACYclovir  500 mg Oral BID    Continuous Infusions:  ceFEPime (MAXIPIME) IV 2 g (11/25/21 1118)   iron sucrose     vancomycin 1,000 mg (11/25/21 1002)    PRN Medications:  acetaminophen **OR** acetaminophen, bisacodyl, hydrALAZINE, lidocaine, lidocaine-prilocaine, mometasone, morphine injection, ondansetron **OR** ondansetron (ZOFRAN) IV, oxyCODONE, senna-docusate, traZODone  Antimicrobials:  Anti-infectives (From admission, onward)    Start     Dose/Rate Route Frequency Ordered Stop   11/25/21 1130  valACYclovir (VALTREX) tablet 500 mg        500 mg Oral 2 times daily 11/25/21 1044     11/25/21 0800  ceFEPIme (MAXIPIME) 2 g in sodium chloride 0.9 % 100 mL IVPB        2 g 200 mL/hr over 30 Minutes Intravenous Every 12 hours 11/25/21 0330     11/25/21 0800  vancomycin (VANCOCIN) IVPB 1000 mg/200 mL premix        1,000 mg 200 mL/hr over 60 Minutes Intravenous Every 24 hours 11/25/21 0334     11/24/21 1715  ceFEPIme (MAXIPIME) 2 g in sodium chloride 0.9 % 100 mL IVPB        2 g 200 mL/hr over 30 Minutes Intravenous   Once 11/24/21 1712 11/24/21 1841   11/24/21 1715  vancomycin (VANCOCIN) IVPB 1000 mg/200 mL premix        1,000 mg 200 mL/hr over 60 Minutes Intravenous  Once 11/24/21 1712 11/24/21 1943           Data Reviewed: I have personally reviewed following labs and imaging studies  CBC: Recent Labs  Lab 11/24/21 1633 11/25/21 0632  WBC 16.2* 8.4  NEUTROABS 13.2*  --   HGB 10.1* 8.3*  HCT 30.9* 25.1*  MCV 80.5 79.7*  PLT 448* 191   Basic Metabolic Panel: Recent Labs  Lab 11/24/21 1633 11/25/21 0632  NA 130* 132*  K 4.5 3.7  CL 97* 102  CO2 24 25  GLUCOSE 107* 89  BUN 24* 19  CREATININE 0.58 0.44  CALCIUM 8.9 8.5*   GFR: Estimated Creatinine Clearance: 41.8 mL/min (by C-G formula based on SCr of  0.44 mg/dL). Liver Function Tests: Recent Labs  Lab 11/24/21 1633 11/25/21 0632  AST 19 14*  ALT 9 9  ALKPHOS 61 51  BILITOT 0.6 0.7  PROT 7.2 5.9*  ALBUMIN 3.0* 2.4*   No results for input(s): "LIPASE", "AMYLASE" in the last 168 hours. No results for input(s): "AMMONIA" in the last 168 hours. Coagulation Profile: Recent Labs  Lab 11/24/21 1633  INR 1.1   Cardiac Enzymes: No results for input(s): "CKTOTAL", "CKMB", "CKMBINDEX", "TROPONINI" in the last 168 hours. BNP (last 3 results) No results for input(s): "PROBNP" in the last 8760 hours. HbA1C: No results for input(s): "HGBA1C" in the last 72 hours. CBG: No results for input(s): "GLUCAP" in the last 168 hours. Lipid Profile: No results for input(s): "CHOL", "HDL", "LDLCALC", "TRIG", "CHOLHDL", "LDLDIRECT" in the last 72 hours. Thyroid Function Tests: No results for input(s): "TSH", "T4TOTAL", "FREET4", "T3FREE", "THYROIDAB" in the last 72 hours. Anemia Panel: No results for input(s): "VITAMINB12", "FOLATE", "FERRITIN", "TIBC", "IRON", "RETICCTPCT" in the last 72 hours. Most Recent Urinalysis On File:     Component Value Date/Time   COLORURINE STRAW (A) 05/19/2021 1543   APPEARANCEUR CLEAR (A) 05/19/2021  1543   LABSPEC 1.019 05/19/2021 1543   PHURINE 8.0 05/19/2021 1543   GLUCOSEU NEGATIVE 05/19/2021 1543   HGBUR NEGATIVE 05/19/2021 1543   BILIRUBINUR NEGATIVE 05/19/2021 1543   KETONESUR 5 (A) 05/19/2021 1543   PROTEINUR NEGATIVE 05/19/2021 1543   NITRITE NEGATIVE 05/19/2021 1543   LEUKOCYTESUR NEGATIVE 05/19/2021 1543   Sepsis Labs: '@LABRCNTIP'$ (procalcitonin:4,lacticidven:4)  Recent Results (from the past 240 hour(s))  Culture, blood (Routine x 2)     Status: None (Preliminary result)   Collection Time: 11/24/21  4:33 PM   Specimen: BLOOD  Result Value Ref Range Status   Specimen Description BLOOD RIGHT ANTECUBITAL  Final   Special Requests   Final    BOTTLES DRAWN AEROBIC AND ANAEROBIC Blood Culture adequate volume   Culture   Final    NO GROWTH < 24 HOURS Performed at [facility], [address] [place], [place] [address]    Report Status PENDING  Incomplete  Culture, blood (Routine x 2)     Status: None (Preliminary result)   Collection Time: 11/24/21  4:33 PM   Specimen: BLOOD  Result Value Ref Range Status   Specimen Description BLOOD BLOOD RIGHT FOREARM  Final   Special Requests   Final    BOTTLES DRAWN AEROBIC AND ANAEROBIC Blood Culture adequate volume   Culture   Final    NO GROWTH < 24 HOURS Performed at [facility], [address] [place],  [address]    Report Status PENDING  Incomplete  Resp Panel by RT-PCR (Flu A&B, Covid) Anterior Nasal Swab     Status: None   Collection Time: 11/24/21  6:10 PM   Specimen: Anterior Nasal Swab  Result Value Ref Range Status   SARS Coronavirus 2 by RT PCR NEGATIVE NEGATIVE Final    Comment: (NOTE) SARS-CoV-2 target nucleic acids are NOT DETECTED.  The SARS-CoV-2 RNA is generally detectable in upper respiratory specimens during the acute phase of infection. The lowest concentration of SARS-CoV-2 viral copies this assay can detect is 138 copies/mL. A negative result does not preclude  SARS-Cov-2 infection and should not be used as the sole basis for treatment or other patient management decisions. A negative result may occur with  improper specimen collection/handling, submission of specimen other than nasopharyngeal swab, presence of viral mutation(s) within the areas targeted  by this assay, and inadequate number of viral copies(<138 copies/mL). A negative result must be combined with clinical observations, patient history, and epidemiological information. The expected result is Negative.  Fact Sheet for Patients:  EntrepreneurPulse.com.au  Fact Sheet for Healthcare Providers:  IncredibleEmployment.be  This test is no t yet approved or cleared by the [place] FDA and  has been authorized for detection and/or diagnosis of SARS-CoV-2 by FDA under an Emergency Use Authorization (EUA). This EUA will remain  in effect (meaning this test can be used) for the duration of the COVID-19 declaration under Section 564(b)(1) of the Act, 21 U.S.C.section 360bbb-3(b)(1), unless the authorization is terminated  or revoked sooner.       Influenza A by PCR NEGATIVE NEGATIVE Final   Influenza B by PCR NEGATIVE NEGATIVE Final    Comment: (NOTE) The Xpert Xpress SARS-CoV-2/FLU/RSV plus assay is intended as an aid in the diagnosis of influenza from Nasopharyngeal swab specimens and should not be used as a sole basis for treatment. Nasal washings and aspirates are unacceptable for Xpert Xpress SARS-CoV-2/FLU/RSV testing.  Fact Sheet for Patients: EntrepreneurPulse.com.au  Fact Sheet for Healthcare Providers: IncredibleEmployment.be  This test is not yet approved or cleared by the [place] FDA and has been authorized for detection and/or diagnosis of SARS-CoV-2 by FDA under an Emergency Use Authorization (EUA). This EUA will remain in effect (meaning this test can be used) for the duration of  the COVID-19 declaration under Section 564(b)(1) of the Act, 21 U.S.C. section 360bbb-3(b)(1), unless the authorization is terminated or revoked.  Performed at [facility], [address], [place], [place] [address]          Radiology Studies: DG Foot 2 Views Left  Result Date: 11/24/2021 CLINICAL DATA:  Cellulitis. Bilateral foot redness, swelling, and serous drainage starting 2 weeks ago. On oral Keflex for 2 days. Numerous abrasions, wounds, and skin breakdown within the bilateral feet. EXAM: LEFT FOOT - 2 VIEW COMPARISON:  None Available. FINDINGS: Moderate to high-grade hallux valgus. Moderate great toe metatarsophalangeal joint space narrowing with mild peripheral degenerative osteophytes. Mild-to-moderate interphalangeal joint space narrowing diffusely. Moderate medial first tarsometatarsal joint space narrowing. Moderate peripheral lateral anterior process of the calcaneus degenerative osteophytosis at the calcaneocuboid joint. Mild midfoot and forefoot soft tissue swelling. There are lucencies suspicious for edema and possibly air within the lateral ankle soft tissues, best seen on frontal view. There is an ulceration of the posterior heel soft tissues apparently contacting the calcaneal heel spur at the Achilles tendon insertion. No definite cortical erosion. Small plantar calcaneal heel spur. No acute fracture or dislocation. IMPRESSION: 1. Posterior heel soft tissue ulceration coming close to a posterior calcaneal heel spur at the Achilles tendon insertion. No definite cortical erosion to suggest osteomyelitis. 2. There are low densities suspicious for edema and fluid within the lateral ankle soft tissues, best seen on frontal view. This is suspicious for cellulitis. 3. Moderate to high-grade hallux valgus. Moderate great toe metatarsophalangeal joint osteoarthritis. Electronically Signed   By: Yvonne Kendall M.D.   On: 11/24/2021 17:52   DG Foot 2 Views Right  Result  Date: 11/24/2021 CLINICAL DATA:  Cellulitis. Bilateral foot redness, swelling, and serous drainage starting 2 weeks ago. On oral Keflex for 2 days. Numerous abrasions, wounds, and skin breakdown within the bilateral feet. EXAM: RIGHT FOOT - 2 VIEW COMPARISON:  None Available. FINDINGS: Two screws are seen within the proximal half of the great toe metatarsal likely status post osteotomy. Mild hallux  valgus. Mild great toe metatarsophalangeal joint space narrowing. Moderate lateral great toe interphalangeal joint space narrowing. Osseous fusion of the third PIP joint. Moderate joint space narrowing of the rest of the second through fifth interphalangeal joints. Small plantar calcaneal heel spur. Mild dorsal midfoot degenerative osteophytes. Moderate midfoot and forefoot soft tissue swelling. No cortical erosion is seen. No subcutaneous emphysema. IMPRESSION: 1. Moderate midfoot and forefoot soft tissue swelling. No radiographic evidence of osteomyelitis. 2. Mild hallux valgus and great toe metatarsophalangeal joint osteoarthritis. Electronically Signed   By: Yvonne Kendall M.D.   On: 11/24/2021 17:47   DG Chest Port 1 View  Result Date: 11/24/2021 CLINICAL DATA:  Sepsis EXAM: PORTABLE CHEST 1 VIEW COMPARISON:  Radiograph 11/04/2019 FINDINGS: Unchanged cardiomediastinal silhouette. Faint right mid lung and lingular opacities. No pleural effusion or pneumothorax. No acute osseous abnormality. Thoracic spondylosis. IMPRESSION: Faint right mid lung and lingular opacities, possibly developing infection or atelectasis. PA and lateral chest radiograph would be useful, if able. Electronically Signed   By: Maurine Simmering M.D.   On: 11/24/2021 16:48            LOS: 1 day    Time spent: 50 min     Emeterio Reeve, DO [facility] 11/25/2021, 1:54 PM    Dictation software may have been used to generate the above note. Typos may occur and escape review in typed/dictated notes. Please contact Dr  Sheppard Coil directly for clarity if needed.  Staff may message me via secure chat in [place]  but this may not receive an immediate response,  please page me for urgent matters!  If 7PM-7AM, please contact night coverage [email or web address]

## 2021-11-25 NOTE — Hospital Course (Addendum)
[name] [name] is a 88 y.o. 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  Patient was in a usual state of health until 2 weeks ago when she developed bilateral lower extremity redness swelling and wounds with serosanguineous drainage.  She was started on Keflex 2 days ago however the symptoms have progressively worsened  11/16: In ED (+)fever 101.4, WBC 16.2, Hgb 10.2. Sodium 130. Patient received cefepime, vancomycin, lactated Ringer's and Tylenol. BCx collected. XR feet bilaterally - no radiographic evidence for osteomyelitis. Medical admission has been requested for further management of sepsis secondary to bilateral lower extremity cellulitis.  11/17: pt reports burning/needle-like pain L lower back, rash concerning for possible shingles, started Valtrex. Continuing abx for cellulitis. WBC improved. VSS. No longer meets sepsis criteria 11/18: WBC and VS no concerns. Skin of feet appears same. Rash on back about same.  11/19: feet appear somewhat improved. D/c vanc. PT/OT for tomorrow (today is Sunday) anticipate may need SNF  11/20: PT/OT recommending SNF. Feet continue to improve. SKin exam same - punctate nonvesciular dermatomal lesion son L lower back / hip area but pt reports no pain - wil maintain valtrex for now.   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)   Sepsis secondary to bilateral lower extremity cellulitis - sepsis criteria have resolved  Admit to inpatient with telemetry monitoring IV antibiotics: Cefepime  IV fluid hydration --> weaned off as taking po  Follow up blood cultures --> BCx NGx2d  Follow CBC --> WBC improved Low threshold for CT or MRI imaging if not improving but low suspicion for osteomyelitis / abscess at this time since she is improving and no fever    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) Immune compromise d/t chemo/radiation  Respiratory precautions  Aggressive abx as above   Rash concerning for Shingles likely precipitates by stress and immune compromise  Valtrex x7 days total  Pain improved, reduced gabapentin  Respiratory precautions  Pain control   History of hypertension Continue losartan   History of GERD Continue Protonix   History of COPD Continue Flonase, O2 as needed, nebs as needed   History of chronic pain Continue oxycodone, OxyContin, gabapentin Increased gabapentin d/t concern for shingles  Lidocaine to rash on back    History of depression Continue Lexapro   DVT prophylaxis: enoxaparin  Pertinent IV fluids/nutrition: no continuous IV fluids  Central lines / invasive devices: none  Code Status: DNR - see DNR form and MOST form    Disposition: inpatient  TOC needs: pending PT/OT - from assisted living, expect she may need SNF/HH on discharge Barriers to discharge / significant pending items: awaiting cultures, requiring IV abx pendign clinical improvement given immune compromise d/t cancer tx

## 2021-11-25 NOTE — Progress Notes (Signed)
Pharmacy Antibiotic Note  [name] [name] is a 88 y.o. female admitted on 11/24/2021 with sepsis.  Pharmacy has been consulted for Cefepime & Vancomycin dosing.  Plan: Cefepime 2 gm q12hr per indication & renal fxn.  Pt given Vancomycin 1000 mg once. Vancomycin 1000 mg IV Q 24 hrs. Goal AUC 400-550. Expected AUC: 525.4 SCr used: 0.8 (11/16 Scr = 0.58)  Pharmacy will continue to follow and will adjust abx dosing whenever warranted.  Temp (24hrs), Avg:98.9 F (37.2 C), Min:97.4 F (36.3 C), Max:101.1 F (38.4 C)   Recent Labs  Lab 11/24/21 1633 11/24/21 2043  WBC 16.2*  --   CREATININE 0.58  --   LATICACIDVEN 0.8 0.9    Estimated Creatinine Clearance: 41.8 mL/min (by C-G formula based on SCr of 0.58 mg/dL).    Allergies  Allergen Reactions   Nsaids Hives   Dextrans Hives   Fentanyl Itching   Lipitor [Atorvastatin] Other (See Comments)    Reaction: Muscle pain   Lovastatin    Mobic [Meloxicam] Other (See Comments)    Reaction: Mouth and tongue ulcers   Niacin And Related Itching   Other    Polysaccharide K Hives   Pravachol [Pravastatin Sodium] Other (See Comments)    Reaction: Muscle pain   Statins Other (See Comments)   Tolmetin Hives   Voltaren [Diclofenac Sodium] Other (See Comments)    Reaction: Mouth and tongue ulcers   Zetia [Ezetimibe] Other (See Comments)    Reaction: Leg pain   Codeine Nausea And Vomiting   Niacin Itching    Antimicrobials this admission: 11/16 Cefepime >>  11/16 Vancomycin >>   Microbiology results: 11/16 BCx: Pending  Thank you for allowing pharmacy to be a part of this patient's care.  Renda Rolls, PharmD, [facility] 11/25/2021 3:34 AM

## 2021-11-25 NOTE — TOC Progression Note (Signed)
Transition of Care [facility]) - Progression Note    Patient Details  Name: [name] [name] MRN: [id number] Date of Birth: 1925-01-15  [facility] CM/SW [place], RN Phone Number: 11/25/2021, 4:02 PM  Clinical Narrative:      Met with the patient and her son in law in the room She lives at [facility] and has some help at home but not all day, Will determine that the disposition is and TOC will follow and assist with DC planning She uses a rollator at home       Expected Discharge Plan and Services                                                 Social Determinants of Health (SDOH) Interventions    Readmission Risk Interventions     No data to display

## 2021-11-26 DIAGNOSIS — K219 Gastro-esophageal reflux disease without esophagitis: Secondary | ICD-10-CM

## 2021-11-26 DIAGNOSIS — E782 Mixed hyperlipidemia: Secondary | ICD-10-CM

## 2021-11-26 DIAGNOSIS — L03116 Cellulitis of left lower limb: Secondary | ICD-10-CM | POA: Diagnosis not present

## 2021-11-26 DIAGNOSIS — D122 Benign neoplasm of ascending colon: Secondary | ICD-10-CM

## 2021-11-26 DIAGNOSIS — D500 Iron deficiency anemia secondary to blood loss (chronic): Secondary | ICD-10-CM

## 2021-11-26 DIAGNOSIS — I10 Essential (primary) hypertension: Secondary | ICD-10-CM

## 2021-11-26 DIAGNOSIS — E871 Hypo-osmolality and hyponatremia: Secondary | ICD-10-CM

## 2021-11-26 DIAGNOSIS — G609 Hereditary and idiopathic neuropathy, unspecified: Secondary | ICD-10-CM

## 2021-11-26 DIAGNOSIS — H3530 Unspecified macular degeneration: Secondary | ICD-10-CM

## 2021-11-26 DIAGNOSIS — C060 Malignant neoplasm of cheek mucosa: Secondary | ICD-10-CM

## 2021-11-26 DIAGNOSIS — F3341 Major depressive disorder, recurrent, in partial remission: Secondary | ICD-10-CM

## 2021-11-26 LAB — CBC
HCT: 25.9 % — ABNORMAL LOW (ref 36.0–46.0)
Hemoglobin: 8.6 g/dL — ABNORMAL LOW (ref 12.0–15.0)
MCH: 26.6 pg (ref 26.0–34.0)
MCHC: 33.2 g/dL (ref 30.0–36.0)
MCV: 80.2 fL (ref 80.0–100.0)
Platelets: 394 10*3/uL (ref 150–400)
RBC: 3.23 MIL/uL — ABNORMAL LOW (ref 3.87–5.11)
RDW: 14.6 % (ref 11.5–15.5)
WBC: 7.7 10*3/uL (ref 4.0–10.5)
nRBC: 0 % (ref 0.0–0.2)

## 2021-11-26 LAB — BASIC METABOLIC PANEL
Anion gap: 7 (ref 5–15)
BUN: 11 mg/dL (ref 8–23)
CO2: 23 mmol/L (ref 22–32)
Calcium: 8.7 mg/dL — ABNORMAL LOW (ref 8.9–10.3)
Chloride: 104 mmol/L (ref 98–111)
Creatinine, Ser: 0.36 mg/dL — ABNORMAL LOW (ref 0.44–1.00)
GFR, Estimated: >60 mL/min (ref >60)
Glucose, Bld: 84 mg/dL (ref 70–99)
Potassium: 4.1 mmol/L (ref 3.5–5.1)
Sodium: 134 mmol/L — ABNORMAL LOW (ref 135–145)

## 2021-11-26 MED ORDER — MORPHINE SULFATE (PF) 2 MG/ML IV SOLN
1.0000 mg | INTRAVENOUS | Status: DC | PRN
  Administered 2021-11-28: 1 mg via INTRAVENOUS
  Filled 2021-11-26: qty 1

## 2021-11-26 MED ORDER — GABAPENTIN 400 MG PO CAPS
500.0000 mg | ORAL_CAPSULE | Freq: Three times a day (TID) | ORAL | Status: DC
  Administered 2021-11-26 – 2021-11-28 (×6): 500 mg via ORAL
  Filled 2021-11-26 (×6): qty 1

## 2021-11-26 MED ORDER — SILVER SULFADIAZINE 1 % EX CREA
TOPICAL_CREAM | Freq: Two times a day (BID) | CUTANEOUS | Status: DC
  Administered 2021-11-29: 1 via TOPICAL
  Filled 2021-11-26: qty 85

## 2021-11-26 NOTE — Progress Notes (Signed)
PROGRESS NOTE    [name] [name]   LEX:[id number] DOB: [birth year]  DOA: 11/24/2021 Date of Service: 11/26/21 PCP: Kirk Ruths, MD     Brief Narrative / Hospital Course:  [name] [name] is a 88 y.o. 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  Patient was in a usual state of health until 2 weeks ago when she developed bilateral lower extremity redness swelling and wounds with serosanguineous drainage.  She was started on Keflex 2 days ago however the symptoms have progressively worsened  11/16: In ED (+)fever 101.4, WBC 16.2, Hgb 10.2. Sodium 130. Patient received cefepime, vancomycin, lactated Ringer's and Tylenol. BCx collected. XR feet bilaterally - no radiographic evidence for osteomyelitis. Medical admission has been requested for further management of sepsis secondary to bilateral lower extremity cellulitis.  11/17: pt reports burning/needle-like pain L lower back, rash concerning for possible shingles, started Valtrex. Continuing abx for cellulitis. WBC improved. VSS. No longer meets sepsis criteria 11/18: WBC and VS no concerns. Skin of feet appears same. Rash on back about same.   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)   Sepsis secondary to bilateral  lower extremity cellulitis - sepsis criteria have resolved  Admit to inpatient with telemetry monitoring IV antibiotics: Cefepime, Vanco pending cx and clinical improvement  IV fluid hydration --> weaned off as taking po  Follow up blood cultures --> BCx NGx2d  Follow CBC --> WBC improved Low threshold for CT or MRI imaging if not improving but low suspicion for osteomyelitis / abscess at this time    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) Immune compromise d/t chemo/radiation  Respiratory precautions  Aggressive abx as above   Rash concerning for Shingles likely precipitates by stress and immune compromise  Valtrex Increased gabapentin  Respiratory precautions  Pain control   History of hypertension Continue losartan   History of GERD Continue Protonix   History of COPD Continue Flonase, O2 as needed, nebs as needed   History of chronic pain Continue oxycodone, OxyContin, gabapentin Increased gabapentin d/t concern for shingles  Lidocaine to rash on back    History of depression Continue Lexapro   DVT prophylaxis: enoxaparin  Pertinent IV fluids/nutrition: no continuous IV fluids  Central lines / invasive devices: none  Code Status: DNR - see DNR form and MOST form    Disposition: inpatient  TOC needs: pending PT/OT - from assisted living, expect she may need SNF/HH on discharge Barriers to discharge / significant pending items: awaiting cultures, requiring IV abx pendign clinical improvement given immune compromise d/t cancer tx              Subjective:  Patient reports pain is about the same Denies CP/SOB.  Denies new weakness.  Tolerating diet.  Reports no concerns w/ urination/defecation.   Family Communication: spoke w/ SIL today on  the floor outside patient's room - he confirmed DNR/MOST in place and has been for some time - updated code status     Objective Findings:  Vitals:   11/25/21 0737 11/25/21 1547 11/25/21 2331 11/26/21  0845  BP: (!) 123/52 (!) 129/46 (!) 125/51 (!) 161/65  Pulse: 78 77 78 87  Resp: '16 16 17 18  '$ Temp: 97.6 F (36.4 C) 98.3 F (36.8 C) 98.1 F (36.7 C) 97.8 F (36.6 C)  TempSrc:   Oral   SpO2: 95% 96% 95% 94%  Weight:      Height:        Intake/Output Summary (Last 24 hours) at 11/26/2021 1008 Last data filed at 11/26/2021 5093 Gross per 24 hour  Intake 400 ml  Output --  Net 400 ml   Filed Weights   11/24/21 1625  Weight: 67.6 kg    Examination:  Constitutional:  VS as above General Appearance: alert, well-developed, well-nourished, NAD Respiratory: Normal respiratory effort CTAB Cardiovascular: S1/S2 normal RRR Gastrointestinal: No tenderness Musculoskeletal:  No clubbing/cyanosis of digits Symmetrical movement in all extremities Neurological: No cranial nerve deficit on limited exam Alert Psychiatric: Normal judgment/insight Normal mood and affect  11/17     11/18       Scheduled Medications:   acidophilus  1 capsule Oral Daily   cyanocobalamin  2,000 mcg Oral Daily   enoxaparin (LOVENOX) injection  40 mg Subcutaneous Q24H   escitalopram  20 mg Oral Daily   fluticasone  1 spray Each Nare Daily   gabapentin  500 mg Oral TID   latanoprost  1 drop Both Eyes QHS   losartan  50 mg Oral Daily   oxyCODONE  15 mg Oral Q12H   pantoprazole  40 mg Oral Daily   potassium chloride  10 mEq Oral TID   sodium chloride flush  3 mL Intravenous Q12H   timolol  1 drop Both Eyes Daily   valACYclovir  500 mg Oral BID    Continuous Infusions:  ceFEPime (MAXIPIME) IV Stopped (11/25/21 2305)   vancomycin 1,000 mg (11/26/21 0907)    PRN Medications:  acetaminophen **OR** acetaminophen, bisacodyl, hydrALAZINE, lidocaine, lidocaine-prilocaine, mometasone, morphine injection, ondansetron **OR** ondansetron (ZOFRAN) IV, oxyCODONE, senna-docusate, traZODone  Antimicrobials:  Anti-infectives (From admission, onward)    Start     Dose/Rate Route Frequency  Ordered Stop   11/25/21 1130  valACYclovir (VALTREX) tablet 500 mg        500 mg Oral 2 times daily 11/25/21 1044     11/25/21 0800  ceFEPIme (MAXIPIME) 2 g in sodium chloride 0.9 % 100 mL IVPB        2 g 200 mL/hr over 30 Minutes Intravenous Every 12 hours 11/25/21 0330     11/25/21 0800  vancomycin (VANCOCIN) IVPB 1000 mg/200 mL premix        1,000 mg 200 mL/hr over 60 Minutes Intravenous Every 24 hours 11/25/21 0334     11/24/21 1715  ceFEPIme (MAXIPIME) 2 g in sodium chloride 0.9 % 100 mL IVPB        2 g 200 mL/hr over 30 Minutes Intravenous  Once 11/24/21 1712 11/24/21 1841   11/24/21 1715  vancomycin (VANCOCIN) IVPB 1000 mg/200 mL premix        1,000 mg 200 mL/hr over 60 Minutes Intravenous  Once 11/24/21 1712 11/24/21 1943           Data Reviewed: I have personally reviewed following labs and imaging studies  CBC: Recent Labs  Lab  11/24/21 1633 11/25/21 0632 11/26/21 0412  WBC 16.2* 8.4 7.7  NEUTROABS 13.2*  --   --   HGB 10.1* 8.3* 8.6*  HCT 30.9* 25.1* 25.9*  MCV 80.5 79.7* 80.2  PLT 448* 367 656   Basic Metabolic Panel: Recent Labs  Lab 11/24/21 1633 11/25/21 0632 11/26/21 0412  NA 130* 132* 134*  K 4.5 3.7 4.1  CL 97* 102 104  CO2 '24 25 23  '$ GLUCOSE 107* 89 84  BUN 24* 19 11  CREATININE 0.58 0.44 0.36*  CALCIUM 8.9 8.5* 8.7*   GFR: Estimated Creatinine Clearance: 41.8 mL/min (A) (by C-G formula based on SCr of 0.36 mg/dL (L)). Liver Function Tests: Recent Labs  Lab 11/24/21 1633 11/25/21 0632  AST 19 14*  ALT 9 9  ALKPHOS 61 51  BILITOT 0.6 0.7  PROT 7.2 5.9*  ALBUMIN 3.0* 2.4*   No results for input(s): "LIPASE", "AMYLASE" in the last 168 hours. No results for input(s): "AMMONIA" in the last 168 hours. Coagulation Profile: Recent Labs  Lab 11/24/21 1633  INR 1.1   Cardiac Enzymes: No results for input(s): "CKTOTAL", "CKMB", "CKMBINDEX", "TROPONINI" in the last 168 hours. BNP (last 3 results) No results for input(s): "PROBNP" in  the last 8760 hours. HbA1C: No results for input(s): "HGBA1C" in the last 72 hours. CBG: No results for input(s): "GLUCAP" in the last 168 hours. Lipid Profile: No results for input(s): "CHOL", "HDL", "LDLCALC", "TRIG", "CHOLHDL", "LDLDIRECT" in the last 72 hours. Thyroid Function Tests: No results for input(s): "TSH", "T4TOTAL", "FREET4", "T3FREE", "THYROIDAB" in the last 72 hours. Anemia Panel: No results for input(s): "VITAMINB12", "FOLATE", "FERRITIN", "TIBC", "IRON", "RETICCTPCT" in the last 72 hours. Most Recent Urinalysis On File:     Component Value Date/Time   COLORURINE STRAW (A) 05/19/2021 1543   APPEARANCEUR CLEAR (A) 05/19/2021 1543   LABSPEC 1.019 05/19/2021 1543   PHURINE 8.0 05/19/2021 1543   GLUCOSEU NEGATIVE 05/19/2021 1543   HGBUR NEGATIVE 05/19/2021 1543   BILIRUBINUR NEGATIVE 05/19/2021 1543   KETONESUR 5 (A) 05/19/2021 1543   PROTEINUR NEGATIVE 05/19/2021 1543   NITRITE NEGATIVE 05/19/2021 1543   LEUKOCYTESUR NEGATIVE 05/19/2021 1543   Sepsis Labs: '@LABRCNTIP'$ (procalcitonin:4,lacticidven:4)  Recent Results (from the past 240 hour(s))  Culture, blood (Routine x 2)     Status: None (Preliminary result)   Collection Time: 11/24/21  4:33 PM   Specimen: BLOOD  Result Value Ref Range Status   Specimen Description BLOOD RIGHT ANTECUBITAL  Final   Special Requests   Final    BOTTLES DRAWN AEROBIC AND ANAEROBIC Blood Culture adequate volume   Culture   Final    NO GROWTH 2 DAYS Performed at [facility], [address], [place], [place] [address]    Report Status PENDING  Incomplete  Culture, blood (Routine x 2)     Status: None (Preliminary result)   Collection Time: 11/24/21  4:33 PM   Specimen: BLOOD  Result Value Ref Range Status   Specimen Description BLOOD BLOOD RIGHT FOREARM  Final   Special Requests   Final    BOTTLES DRAWN AEROBIC AND ANAEROBIC Blood Culture adequate volume   Culture   Final    NO GROWTH 2 DAYS Performed at [facility], [address] [place], [place] [address]    Report Status PENDING  Incomplete  Resp Panel by RT-PCR (Flu A&B, Covid) Anterior Nasal Swab     Status: None   Collection Time: 11/24/21  6:10 PM   Specimen: Anterior Nasal  Swab  Result Value Ref Range Status   SARS Coronavirus 2 by RT PCR NEGATIVE NEGATIVE Final    Comment: (NOTE) SARS-CoV-2 target nucleic acids are NOT DETECTED.  The SARS-CoV-2 RNA is generally detectable in upper respiratory specimens during the acute phase of infection. The lowest concentration of SARS-CoV-2 viral copies this assay can detect is 138 copies/mL. A negative result does not preclude SARS-Cov-2 infection and should not be used as the sole basis for treatment or other patient management decisions. A negative result may occur with  improper specimen collection/handling, submission of specimen other than nasopharyngeal swab, presence of viral mutation(s) within the areas targeted by this assay, and inadequate number of viral copies(<138 copies/mL). A negative result must be combined with clinical observations, patient history, and epidemiological information. The expected result is Negative.  Fact Sheet for Patients:  EntrepreneurPulse.com.au  Fact Sheet for Healthcare Providers:  IncredibleEmployment.be  This test is no t yet approved or cleared by the [place] FDA and  has been authorized for detection and/or diagnosis of SARS-CoV-2 by FDA under an Emergency Use Authorization (EUA). This EUA will remain  in effect (meaning this test can be used) for the duration of the COVID-19 declaration under Section 564(b)(1) of the Act, 21 U.S.C.section 360bbb-3(b)(1), unless the authorization is terminated  or revoked sooner.       Influenza A by PCR NEGATIVE NEGATIVE Final   Influenza B by PCR NEGATIVE NEGATIVE Final    Comment: (NOTE) The Xpert Xpress SARS-CoV-2/FLU/RSV plus assay is intended as an  aid in the diagnosis of influenza from Nasopharyngeal swab specimens and should not be used as a sole basis for treatment. Nasal washings and aspirates are unacceptable for Xpert Xpress SARS-CoV-2/FLU/RSV testing.  Fact Sheet for Patients: EntrepreneurPulse.com.au  Fact Sheet for Healthcare Providers: IncredibleEmployment.be  This test is not yet approved or cleared by the [place] FDA and has been authorized for detection and/or diagnosis of SARS-CoV-2 by FDA under an Emergency Use Authorization (EUA). This EUA will remain in effect (meaning this test can be used) for the duration of the COVID-19 declaration under Section 564(b)(1) of the Act, 21 U.S.C. section 360bbb-3(b)(1), unless the authorization is terminated or revoked.  Performed at [facility], [address], [place], [place] [address]          Radiology Studies: DG Foot 2 Views Left  Result Date: 11/24/2021 CLINICAL DATA:  Cellulitis. Bilateral foot redness, swelling, and serous drainage starting 2 weeks ago. On oral Keflex for 2 days. Numerous abrasions, wounds, and skin breakdown within the bilateral feet. EXAM: LEFT FOOT - 2 VIEW COMPARISON:  None Available. FINDINGS: Moderate to high-grade hallux valgus. Moderate great toe metatarsophalangeal joint space narrowing with mild peripheral degenerative osteophytes. Mild-to-moderate interphalangeal joint space narrowing diffusely. Moderate medial first tarsometatarsal joint space narrowing. Moderate peripheral lateral anterior process of the calcaneus degenerative osteophytosis at the calcaneocuboid joint. Mild midfoot and forefoot soft tissue swelling. There are lucencies suspicious for edema and possibly air within the lateral ankle soft tissues, best seen on frontal view. There is an ulceration of the posterior heel soft tissues apparently contacting the calcaneal heel spur at the Achilles tendon insertion. No definite  cortical erosion. Small plantar calcaneal heel spur. No acute fracture or dislocation. IMPRESSION: 1. Posterior heel soft tissue ulceration coming close to a posterior calcaneal heel spur at the Achilles tendon insertion. No definite cortical erosion to suggest osteomyelitis. 2. There are low densities suspicious for edema and fluid within the lateral ankle  soft tissues, best seen on frontal view. This is suspicious for cellulitis. 3. Moderate to high-grade hallux valgus. Moderate great toe metatarsophalangeal joint osteoarthritis. Electronically Signed   By: Yvonne Kendall M.D.   On: 11/24/2021 17:52   DG Foot 2 Views Right  Result Date: 11/24/2021 CLINICAL DATA:  Cellulitis. Bilateral foot redness, swelling, and serous drainage starting 2 weeks ago. On oral Keflex for 2 days. Numerous abrasions, wounds, and skin breakdown within the bilateral feet. EXAM: RIGHT FOOT - 2 VIEW COMPARISON:  None Available. FINDINGS: Two screws are seen within the proximal half of the great toe metatarsal likely status post osteotomy. Mild hallux valgus. Mild great toe metatarsophalangeal joint space narrowing. Moderate lateral great toe interphalangeal joint space narrowing. Osseous fusion of the third PIP joint. Moderate joint space narrowing of the rest of the second through fifth interphalangeal joints. Small plantar calcaneal heel spur. Mild dorsal midfoot degenerative osteophytes. Moderate midfoot and forefoot soft tissue swelling. No cortical erosion is seen. No subcutaneous emphysema. IMPRESSION: 1. Moderate midfoot and forefoot soft tissue swelling. No radiographic evidence of osteomyelitis. 2. Mild hallux valgus and great toe metatarsophalangeal joint osteoarthritis. Electronically Signed   By: Yvonne Kendall M.D.   On: 11/24/2021 17:47   DG Chest Port 1 View  Result Date: 11/24/2021 CLINICAL DATA:  Sepsis EXAM: PORTABLE CHEST 1 VIEW COMPARISON:  Radiograph 11/04/2019 FINDINGS: Unchanged cardiomediastinal silhouette.  Faint right mid lung and lingular opacities. No pleural effusion or pneumothorax. No acute osseous abnormality. Thoracic spondylosis. IMPRESSION: Faint right mid lung and lingular opacities, possibly developing infection or atelectasis. PA and lateral chest radiograph would be useful, if able. Electronically Signed   By: Maurine Simmering M.D.   On: 11/24/2021 16:48            LOS: 2 days       Emeterio Reeve, DO [facility] 11/26/2021, 10:08 AM    Dictation software may have been used to generate the above note. Typos may occur and escape review in typed/dictated notes. Please contact Dr Sheppard Coil directly for clarity if needed.  Staff may message me via secure chat in [place]  but this may not receive an immediate response,  please page me for urgent matters!  If 7PM-7AM, please contact night coverage [email or web address]

## 2021-11-27 DIAGNOSIS — E782 Mixed hyperlipidemia: Secondary | ICD-10-CM | POA: Diagnosis not present

## 2021-11-27 DIAGNOSIS — L03116 Cellulitis of left lower limb: Secondary | ICD-10-CM | POA: Diagnosis not present

## 2021-11-27 DIAGNOSIS — E871 Hypo-osmolality and hyponatremia: Secondary | ICD-10-CM | POA: Diagnosis not present

## 2021-11-27 DIAGNOSIS — K219 Gastro-esophageal reflux disease without esophagitis: Secondary | ICD-10-CM | POA: Diagnosis not present

## 2021-11-27 LAB — URINALYSIS, COMPLETE (UACMP) WITH MICROSCOPIC
Bilirubin Urine: NEGATIVE
Glucose, UA: NEGATIVE mg/dL
Hgb urine dipstick: NEGATIVE
Ketones, ur: NEGATIVE mg/dL
Leukocytes,Ua: NEGATIVE
Nitrite: NEGATIVE
Protein, ur: NEGATIVE mg/dL
Specific Gravity, Urine: 1.012 (ref 1.005–1.030)
pH: 6 (ref 5.0–8.0)

## 2021-11-27 LAB — CREATININE, SERUM
Creatinine, Ser: 0.41 mg/dL — ABNORMAL LOW (ref 0.44–1.00)
GFR, Estimated: >60 mL/min (ref >60)

## 2021-11-27 NOTE — Progress Notes (Signed)
PROGRESS NOTE    [name] [name]   TDV:[id number] DOB: 1925-12-04  DOA: 11/24/2021 Date of Service: 11/27/21 PCP: Kirk Ruths, MD     Brief Narrative / Hospital Course:  [name] [name] is a 88 y.o. 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  Patient was in a usual state of health until 2 weeks ago when she developed bilateral lower extremity redness swelling and wounds with serosanguineous drainage.  She was started on Keflex 2 days ago however the symptoms have progressively worsened  11/16: In ED (+)fever 101.4, WBC 16.2, Hgb 10.2. Sodium 130. Patient received cefepime, vancomycin, lactated Ringer's and Tylenol. BCx collected. XR feet bilaterally - no radiographic evidence for osteomyelitis. Medical admission has been requested for further management of sepsis secondary to bilateral lower extremity cellulitis.  11/17: pt reports burning/needle-like pain L lower back, rash concerning for possible shingles, started Valtrex. Continuing abx for cellulitis. WBC improved. VSS. No longer meets sepsis criteria 11/18: WBC and VS no concerns. Skin of feet appears same. Rash on back about same.  11/19: feet appear somewhat improved. D/c vanc. 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)   Sepsis secondary to bilateral lower extremity cellulitis - sepsis criteria have resolved  Admit to inpatient with telemetry monitoring IV antibiotics: Cefepime  IV fluid hydration --> weaned off as taking po  Follow up blood cultures --> BCx NGx2d  Follow CBC --> WBC improved Low threshold for CT or MRI imaging if not improving but low suspicion for osteomyelitis / abscess at this time since she is improving today and no fever    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) Immune compromise d/t chemo/radiation  Respiratory precautions  Aggressive abx as above   Rash concerning for Shingles likely precipitates by stress and immune compromise  Valtrex Increased gabapentin  Respiratory precautions  Pain control   History of hypertension Continue losartan   History of GERD Continue Protonix   History of COPD Continue Flonase, O2 as needed, nebs as needed   History of chronic pain Continue oxycodone, OxyContin, gabapentin Increased gabapentin d/t concern for shingles  Lidocaine to rash on back    History of depression Continue Lexapro   DVT prophylaxis: enoxaparin  Pertinent IV fluids/nutrition: no continuous IV fluids  Central lines / invasive devices: none  Code Status: DNR - see DNR form and MOST form    Disposition: inpatient  TOC needs: pending PT/OT - from assisted living, expect she may need SNF/HH on discharge Barriers to discharge / significant pending items: awaiting cultures, requiring IV abx pendign clinical improvement given immune compromise d/t cancer tx              Subjective:  Patient reports pain is a bit better from yesterday  Denies  CP/SOB.  Denies new weakness.  Tolerating diet.  Reports no concerns w/ urination/defecation.   Family Communication: spoke w/ SIL today at bedside on rounds     Objective Findings:  Vitals:   11/26/21 2128 11/27/21 0605  11/27/21 0846 11/27/21 0916  BP: (!) 133/56 (!) 145/61 (!) 167/84 (!) 161/68  Pulse: 74 86 94 90  Resp: 18 18    Temp: 97.8 F (36.6 C) 98.3 F (36.8 C) 97.7 F (36.5 C)   TempSrc:      SpO2: 93% 96% 98%   Weight:      Height:        Intake/Output Summary (Last 24 hours) at 11/27/2021 1451 Last data filed at 11/27/2021 0867 Gross per 24 hour  Intake 100 ml  Output 500 ml  Net -400 ml   Filed Weights   11/24/21 1625  Weight: 67.6 kg    Examination:  Constitutional:  VS as above General Appearance: alert, well-developed, well-nourished, NAD Respiratory: Normal respiratory effort CTAB Cardiovascular: S1/S2 normal RRR Gastrointestinal: No tenderness Musculoskeletal:  No clubbing/cyanosis of digits Symmetrical movement in all extremities Neurological: No cranial nerve deficit on limited exam Alert Psychiatric: Normal judgment/insight Normal mood and affect  11/17     11/18       Scheduled Medications:   acidophilus  1 capsule Oral Daily   cyanocobalamin  2,000 mcg Oral Daily   enoxaparin (LOVENOX) injection  40 mg Subcutaneous Q24H   escitalopram  20 mg Oral Daily   fluticasone  1 spray Each Nare Daily   gabapentin  500 mg Oral TID   latanoprost  1 drop Both Eyes QHS   losartan  50 mg Oral Daily   oxyCODONE  15 mg Oral Q12H   pantoprazole  40 mg Oral Daily   potassium chloride  10 mEq Oral TID   silver sulfADIAZINE   Topical BID   sodium chloride flush  3 mL Intravenous Q12H   timolol  1 drop Both Eyes Daily   valACYclovir  500 mg Oral BID    Continuous Infusions:  ceFEPime (MAXIPIME) IV 2 g (11/27/21 1115)    PRN Medications:  acetaminophen **OR** acetaminophen, bisacodyl, hydrALAZINE, lidocaine, lidocaine-prilocaine, mometasone, morphine injection, ondansetron **OR** ondansetron (ZOFRAN) IV, oxyCODONE, senna-docusate, traZODone  Antimicrobials:  Anti-infectives (From admission, onward)    Start     Dose/Rate Route Frequency Ordered  Stop   11/25/21 1130  valACYclovir (VALTREX) tablet 500 mg        500 mg Oral 2 times daily 11/25/21 1044     11/25/21 0800  ceFEPIme (MAXIPIME) 2 g in sodium chloride 0.9 % 100 mL IVPB        2 g 200 mL/hr over 30 Minutes Intravenous Every 12 hours 11/25/21 0330     11/25/21 0800  vancomycin (VANCOCIN) IVPB 1000 mg/200 mL premix  Status:  Discontinued        1,000 mg 200 mL/hr over 60 Minutes Intravenous Every 24 hours 11/25/21 0334 11/27/21 1254   11/24/21 1715  ceFEPIme (MAXIPIME) 2 g in sodium chloride 0.9 % 100 mL IVPB        2 g 200 mL/hr over 30 Minutes Intravenous  Once 11/24/21 1712 11/24/21 1841   11/24/21 1715  vancomycin (VANCOCIN) IVPB 1000 mg/200 mL premix        1,000 mg 200 mL/hr over 60 Minutes Intravenous  Once 11/24/21 1712 11/24/21 1943           Data Reviewed: I have personally reviewed following labs  and imaging studies  CBC: Recent Labs  Lab 11/24/21 1633 11/25/21 0632 11/26/21 0412  WBC 16.2* 8.4 7.7  NEUTROABS 13.2*  --   --   HGB 10.1* 8.3* 8.6*  HCT 30.9* 25.1* 25.9*  MCV 80.5 79.7* 80.2  PLT 448* 367 546   Basic Metabolic Panel: Recent Labs  Lab 11/24/21 1633 11/25/21 0632 11/26/21 0412 11/27/21 0556  NA 130* 132* 134*  --   K 4.5 3.7 4.1  --   CL 97* 102 104  --   CO2 '24 25 23  '$ --   GLUCOSE 107* 89 84  --   BUN 24* 19 11  --   CREATININE 0.58 0.44 0.36* 0.41*  CALCIUM 8.9 8.5* 8.7*  --    GFR: Estimated Creatinine Clearance: 41.8 mL/min (A) (by C-G formula based on SCr of 0.41 mg/dL (L)). Liver Function Tests: Recent Labs  Lab 11/24/21 1633 11/25/21 0632  AST 19 14*  ALT 9 9  ALKPHOS 61 51  BILITOT 0.6 0.7  PROT 7.2 5.9*  ALBUMIN 3.0* 2.4*   No results for input(s): "LIPASE", "AMYLASE" in the last 168 hours. No results for input(s): "AMMONIA" in the last 168 hours. Coagulation Profile: Recent Labs  Lab 11/24/21 1633  INR 1.1   Cardiac Enzymes: No results for input(s): "CKTOTAL", "CKMB", "CKMBINDEX", "TROPONINI"  in the last 168 hours. BNP (last 3 results) No results for input(s): "PROBNP" in the last 8760 hours. HbA1C: No results for input(s): "HGBA1C" in the last 72 hours. CBG: No results for input(s): "GLUCAP" in the last 168 hours. Lipid Profile: No results for input(s): "CHOL", "HDL", "LDLCALC", "TRIG", "CHOLHDL", "LDLDIRECT" in the last 72 hours. Thyroid Function Tests: No results for input(s): "TSH", "T4TOTAL", "FREET4", "T3FREE", "THYROIDAB" in the last 72 hours. Anemia Panel: No results for input(s): "VITAMINB12", "FOLATE", "FERRITIN", "TIBC", "IRON", "RETICCTPCT" in the last 72 hours. Most Recent Urinalysis On File:     Component Value Date/Time   COLORURINE YELLOW (A) 11/27/2021 0405   APPEARANCEUR HAZY (A) 11/27/2021 0405   LABSPEC 1.012 11/27/2021 0405   PHURINE 6.0 11/27/2021 0405   GLUCOSEU NEGATIVE 11/27/2021 0405   HGBUR NEGATIVE 11/27/2021 0405   BILIRUBINUR NEGATIVE 11/27/2021 0405   KETONESUR NEGATIVE 11/27/2021 0405   PROTEINUR NEGATIVE 11/27/2021 0405   NITRITE NEGATIVE 11/27/2021 0405   LEUKOCYTESUR NEGATIVE 11/27/2021 0405   Sepsis Labs: '@LABRCNTIP'$ (procalcitonin:4,lacticidven:4)  Recent Results (from the past 240 hour(s))  Culture, blood (Routine x 2)     Status: None (Preliminary result)   Collection Time: 11/24/21  4:33 PM   Specimen: BLOOD  Result Value Ref Range Status   Specimen Description BLOOD RIGHT ANTECUBITAL  Final   Special Requests   Final    BOTTLES DRAWN AEROBIC AND ANAEROBIC Blood Culture adequate volume   Culture   Final    NO GROWTH 3 DAYS Performed at [facility], [address] [place], [place] [address]    Report Status PENDING  Incomplete  Culture, blood (Routine x 2)     Status: None (Preliminary result)   Collection Time: 11/24/21  4:33 PM   Specimen: BLOOD  Result Value Ref Range Status   Specimen Description BLOOD BLOOD RIGHT FOREARM  Final   Special Requests   Final    BOTTLES DRAWN AEROBIC AND ANAEROBIC Blood  Culture adequate volume   Culture   Final    NO GROWTH 3 DAYS Performed at [facility], [address], [place], [place] [address]    Report Status PENDING  Incomplete  Resp Panel by RT-PCR (Flu A&B, Covid) Anterior Nasal Swab     Status: None   Collection Time: 11/24/21  6:10 PM   Specimen: Anterior Nasal Swab  Result Value Ref Range Status   SARS Coronavirus 2 by RT PCR NEGATIVE NEGATIVE Final    Comment: (NOTE) SARS-CoV-2 target nucleic acids are NOT DETECTED.  The SARS-CoV-2 RNA is generally detectable in upper respiratory specimens during the acute phase of infection. The lowest concentration of SARS-CoV-2 viral copies this assay can detect is 138 copies/mL. A negative result does not preclude SARS-Cov-2 infection and should not be used as the sole basis for treatment or other patient management decisions. A negative result may occur with  improper specimen collection/handling, submission of specimen other than nasopharyngeal swab, presence of viral mutation(s) within the areas targeted by this assay, and inadequate number of viral copies(<138 copies/mL). A negative result must be combined with clinical observations, patient history, and epidemiological information. The expected result is Negative.  Fact Sheet for Patients:  EntrepreneurPulse.com.au  Fact Sheet for Healthcare Providers:  IncredibleEmployment.be  This test is no t yet approved or cleared by the [place] FDA and  has been authorized for detection and/or diagnosis of SARS-CoV-2 by FDA under an Emergency Use Authorization (EUA). This EUA will remain  in effect (meaning this test can be used) for the duration of the COVID-19 declaration under Section 564(b)(1) of the Act, 21 U.S.C.section 360bbb-3(b)(1), unless the authorization is terminated  or revoked sooner.       Influenza A by PCR NEGATIVE NEGATIVE Final   Influenza B by PCR NEGATIVE NEGATIVE Final     Comment: (NOTE) The Xpert Xpress SARS-CoV-2/FLU/RSV plus assay is intended as an aid in the diagnosis of influenza from Nasopharyngeal swab specimens and should not be used as a sole basis for treatment. Nasal washings and aspirates are unacceptable for Xpert Xpress SARS-CoV-2/FLU/RSV testing.  Fact Sheet for Patients: EntrepreneurPulse.com.au  Fact Sheet for Healthcare Providers: IncredibleEmployment.be  This test is not yet approved or cleared by the [place] FDA and has been authorized for detection and/or diagnosis of SARS-CoV-2 by FDA under an Emergency Use Authorization (EUA). This EUA will remain in effect (meaning this test can be used) for the duration of the COVID-19 declaration under Section 564(b)(1) of the Act, 21 U.S.C. section 360bbb-3(b)(1), unless the authorization is terminated or revoked.  Performed at [facility], [address], [place], [place] [address]          Radiology Studies: DG Foot 2 Views Left  Result Date: 11/24/2021 CLINICAL DATA:  Cellulitis. Bilateral foot redness, swelling, and serous drainage starting 2 weeks ago. On oral Keflex for 2 days. Numerous abrasions, wounds, and skin breakdown within the bilateral feet. EXAM: LEFT FOOT - 2 VIEW COMPARISON:  None Available. FINDINGS: Moderate to high-grade hallux valgus. Moderate great toe metatarsophalangeal joint space narrowing with mild peripheral degenerative osteophytes. Mild-to-moderate interphalangeal joint space narrowing diffusely. Moderate medial first tarsometatarsal joint space narrowing. Moderate peripheral lateral anterior process of the calcaneus degenerative osteophytosis at the calcaneocuboid joint. Mild midfoot and forefoot soft tissue swelling. There are lucencies suspicious for edema and possibly air within the lateral ankle soft tissues, best seen on frontal view. There is an ulceration of the posterior heel soft tissues  apparently contacting the calcaneal heel spur at the Achilles tendon insertion. No definite cortical erosion. Small plantar calcaneal heel spur. No acute fracture or dislocation. IMPRESSION: 1. Posterior heel soft tissue ulceration coming close to  a posterior calcaneal heel spur at the Achilles tendon insertion. No definite cortical erosion to suggest osteomyelitis. 2. There are low densities suspicious for edema and fluid within the lateral ankle soft tissues, best seen on frontal view. This is suspicious for cellulitis. 3. Moderate to high-grade hallux valgus. Moderate great toe metatarsophalangeal joint osteoarthritis. Electronically Signed   By: Yvonne Kendall M.D.   On: 11/24/2021 17:52   DG Foot 2 Views Right  Result Date: 11/24/2021 CLINICAL DATA:  Cellulitis. Bilateral foot redness, swelling, and serous drainage starting 2 weeks ago. On oral Keflex for 2 days. Numerous abrasions, wounds, and skin breakdown within the bilateral feet. EXAM: RIGHT FOOT - 2 VIEW COMPARISON:  None Available. FINDINGS: Two screws are seen within the proximal half of the great toe metatarsal likely status post osteotomy. Mild hallux valgus. Mild great toe metatarsophalangeal joint space narrowing. Moderate lateral great toe interphalangeal joint space narrowing. Osseous fusion of the third PIP joint. Moderate joint space narrowing of the rest of the second through fifth interphalangeal joints. Small plantar calcaneal heel spur. Mild dorsal midfoot degenerative osteophytes. Moderate midfoot and forefoot soft tissue swelling. No cortical erosion is seen. No subcutaneous emphysema. IMPRESSION: 1. Moderate midfoot and forefoot soft tissue swelling. No radiographic evidence of osteomyelitis. 2. Mild hallux valgus and great toe metatarsophalangeal joint osteoarthritis. Electronically Signed   By: Yvonne Kendall M.D.   On: 11/24/2021 17:47   DG Chest Port 1 View  Result Date: 11/24/2021 CLINICAL DATA:  Sepsis EXAM: PORTABLE CHEST  1 VIEW COMPARISON:  Radiograph 11/04/2019 FINDINGS: Unchanged cardiomediastinal silhouette. Faint right mid lung and lingular opacities. No pleural effusion or pneumothorax. No acute osseous abnormality. Thoracic spondylosis. IMPRESSION: Faint right mid lung and lingular opacities, possibly developing infection or atelectasis. PA and lateral chest radiograph would be useful, if able. Electronically Signed   By: Maurine Simmering M.D.   On: 11/24/2021 16:48            LOS: 3 days       Emeterio Reeve, DO [facility] 11/27/2021, 2:51 PM    Dictation software may have been used to generate the above note. Typos may occur and escape review in typed/dictated notes. Please contact Dr Sheppard Coil directly for clarity if needed.  Staff may message me via secure chat in [place]  but this may not receive an immediate response,  please page me for urgent matters!  If 7PM-7AM, please contact night coverage [email or web address]

## 2021-11-27 NOTE — Plan of Care (Signed)
°  Problem: Clinical Measurements: °Goal: Diagnostic test results will improve °Outcome: Progressing °  °Problem: Clinical Measurements: °Goal: Will remain free from infection °Outcome: Progressing °  °

## 2021-11-27 NOTE — Consult Note (Signed)
[facility] Nurse Consult Note: Reason for Consult:lesions on bilateral feel and lowert back. Etiology not known, patient is responding to antiviral therapy.Patient reports pain and burning sensation Wound type:partial thickness lesions with purple-red discoloration Pressure Injury POA: N/A Measurement:See photos uploaded to EMR by Provider Wound bed:red, moist Drainage (amount, consistency, odor) serous Periwound:as described above Dressing procedure/placement/frequency: The idiopathic nature of the lesions is outside the scope of [facility] nursing practice. I have provided conservative care suggestions to comfort, e.g., using pressure redistribution heel boots and a silicone foam dressing with a single layer of xeroform (antimicrobial, nonadherent) as a wound contact layer.  [facility] nursing team will not follow, but will remain available to this patient, the nursing and medical teams.  Please re-consult if needed.  Thank you for inviting [place] to participate in this patient's Plan of Care.  Maudie Flakes, MSN, RN, CNS, [place], Serita Grammes, [employer], [employer] phone:  [phone]

## 2021-11-28 DIAGNOSIS — K219 Gastro-esophageal reflux disease without esophagitis: Secondary | ICD-10-CM | POA: Diagnosis not present

## 2021-11-28 DIAGNOSIS — E871 Hypo-osmolality and hyponatremia: Secondary | ICD-10-CM | POA: Diagnosis not present

## 2021-11-28 DIAGNOSIS — E782 Mixed hyperlipidemia: Secondary | ICD-10-CM | POA: Diagnosis not present

## 2021-11-28 DIAGNOSIS — L03116 Cellulitis of left lower limb: Secondary | ICD-10-CM | POA: Diagnosis not present

## 2021-11-28 MED ORDER — GABAPENTIN 400 MG PO CAPS
400.0000 mg | ORAL_CAPSULE | Freq: Three times a day (TID) | ORAL | Status: DC
  Administered 2021-11-28 – 2021-11-29 (×3): 400 mg via ORAL
  Filled 2021-11-28 (×3): qty 1

## 2021-11-28 NOTE — Progress Notes (Signed)
PROGRESS NOTE    [name] [name]   VVO:[id number] DOB: 1925/07/02  DOA: 11/24/2021 Date of Service: 11/28/21 PCP: Kirk Ruths, MD     Brief Narrative / Hospital Course:  [name] [name] is a 88 y.o. 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  Patient was in a usual state of health until 2 weeks ago when she developed bilateral lower extremity redness swelling and wounds with serosanguineous drainage.  She was started on Keflex 2 days ago however the symptoms have progressively worsened  11/16: In ED (+)fever 101.4, WBC 16.2, Hgb 10.2. Sodium 130. Patient received cefepime, vancomycin, lactated Ringer's and Tylenol. BCx collected. XR feet bilaterally - no radiographic evidence for osteomyelitis. Medical admission has been requested for further management of sepsis secondary to bilateral lower extremity cellulitis.  11/17: pt reports burning/needle-like pain L lower back, rash concerning for possible shingles, started Valtrex. Continuing abx for cellulitis. WBC improved. VSS. No longer meets sepsis criteria 11/18: WBC and VS no concerns. Skin of feet appears same. Rash on back about same.  11/19: feet appear somewhat improved. D/c vanc. PT/OT for tomorrow (today is Sunday) anticipate may need SNF  11/20: PT/OT recommending SNF. Feet continue to improve. SKin exam same - punctate nonvesciular dermatomal lesion son L lower back / hip area but pt reports no pain - wil maintain valtrex for now.   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)   Sepsis secondary to bilateral lower extremity cellulitis - sepsis criteria have resolved  Admit to inpatient with telemetry monitoring IV antibiotics: Cefepime  IV fluid hydration --> weaned off as taking po  Follow up blood cultures --> BCx NGx2d  Follow CBC --> WBC improved Low threshold for CT or MRI imaging if not improving but low suspicion for osteomyelitis / abscess at this time since she is improving and no fever    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) Immune compromise d/t chemo/radiation  Respiratory precautions  Aggressive abx as above   Rash concerning for Shingles likely precipitates by stress and immune compromise  Valtrex x7 days total  Pain improved, reduced gabapentin  Respiratory precautions  Pain control   History of hypertension Continue losartan   History of GERD Continue Protonix   History of COPD Continue Flonase, O2 as needed, nebs as needed   History of chronic pain Continue oxycodone, OxyContin, gabapentin Increased gabapentin d/t concern for shingles  Lidocaine to rash on back    History of depression Continue Lexapro   DVT prophylaxis: enoxaparin  Pertinent IV fluids/nutrition: no continuous IV fluids  Central lines / invasive devices: none  Code Status: DNR - see DNR form and MOST form    Disposition: inpatient  TOC needs: pending PT/OT - from assisted living, expect she may need SNF/HH on discharge Barriers to discharge / significant pending items:  awaiting cultures, requiring IV abx pendign clinical improvement given immune compromise d/t cancer tx              Subjective:  Patient reports pain is a bit better from yesterday  Denies  CP/SOB.  Denies new weakness.  Tolerating diet.  Reports no concerns w/ urination/defecation.   Family Communication: spoke w/ her personal CMA today at bedside on rounds     Objective Findings:  Vitals:   11/27/21 0846 11/27/21 0916 11/27/21 1542 11/28/21 0810  BP: (!) 167/84 (!) 161/68 (!) 138/56 (!) 152/59  Pulse: 94 90 73 79  Resp:      Temp: 97.7 F (36.5 C)  98.2 F (36.8 C) 98.9 F (37.2 C)  TempSrc:      SpO2: 98%  96% 97%  Weight:      Height:        Intake/Output Summary (Last 24 hours) at 11/28/2021 1445 Last data filed at 11/28/2021 0507 Gross per 24 hour  Intake 340 ml  Output --  Net 340 ml   Filed Weights   11/24/21 1625  Weight: 67.6 kg    Examination:  Constitutional:  VS as above General Appearance: alert, well-developed, well-nourished, NAD Respiratory: Normal respiratory effort CTAB Cardiovascular: S1/S2 normal RRR Gastrointestinal: No tenderness Musculoskeletal:  No clubbing/cyanosis of digits Symmetrical movement in all extremities Neurological: No cranial nerve deficit on limited exam Alert Psychiatric: Normal judgment/insight Normal mood and affect  11/17     11/18       Scheduled Medications:   acidophilus  1 capsule Oral Daily   cyanocobalamin  2,000 mcg Oral Daily   enoxaparin (LOVENOX) injection  40 mg Subcutaneous Q24H   escitalopram  20 mg Oral Daily   fluticasone  1 spray Each Nare Daily   gabapentin  400 mg Oral TID   latanoprost  1 drop Both Eyes QHS   losartan  50 mg Oral Daily   oxyCODONE  15 mg Oral Q12H   pantoprazole  40 mg Oral Daily   potassium chloride  10 mEq Oral TID   silver sulfADIAZINE   Topical BID   sodium chloride flush  3 mL Intravenous Q12H   timolol  1 drop Both Eyes Daily   valACYclovir  500 mg Oral BID    Continuous Infusions:  ceFEPime (MAXIPIME) IV 2 g (11/28/21 1110)    PRN Medications:  acetaminophen **OR** acetaminophen, bisacodyl, hydrALAZINE, lidocaine,  lidocaine-prilocaine, mometasone, morphine injection, ondansetron **OR** ondansetron (ZOFRAN) IV, oxyCODONE, senna-docusate, traZODone  Antimicrobials:  Anti-infectives (From admission, onward)    Start     Dose/Rate Route Frequency Ordered Stop   11/25/21 1130  valACYclovir (VALTREX) tablet 500 mg        500 mg Oral 2 times daily 11/25/21 1044 12/02/21 0959   11/25/21 0800  ceFEPIme (MAXIPIME) 2 g in sodium chloride 0.9 % 100 mL IVPB        2 g 200 mL/hr over 30 Minutes Intravenous Every 12 hours 11/25/21 0330     11/25/21 0800  vancomycin (VANCOCIN) IVPB 1000 mg/200 mL premix  Status:  Discontinued        1,000 mg 200 mL/hr over 60 Minutes Intravenous Every 24 hours 11/25/21 0334 11/27/21 1254   11/24/21 1715  ceFEPIme (MAXIPIME) 2 g in sodium chloride 0.9 % 100 mL IVPB        2 g 200 mL/hr over 30 Minutes Intravenous  Once 11/24/21 1712 11/24/21 1841   11/24/21 1715  vancomycin (VANCOCIN) IVPB 1000 mg/200  mL premix        1,000 mg 200 mL/hr over 60 Minutes Intravenous  Once 11/24/21 1712 11/24/21 1943           Data Reviewed: I have personally reviewed following labs and imaging studies  CBC: Recent Labs  Lab 11/24/21 1633 11/25/21 0632 11/26/21 0412  WBC 16.2* 8.4 7.7  NEUTROABS 13.2*  --   --   HGB 10.1* 8.3* 8.6*  HCT 30.9* 25.1* 25.9*  MCV 80.5 79.7* 80.2  PLT 448* 367 518   Basic Metabolic Panel: Recent Labs  Lab 11/24/21 1633 11/25/21 0632 11/26/21 0412 11/27/21 0556  NA 130* 132* 134*  --   K 4.5 3.7 4.1  --   CL 97* 102 104  --   CO2 '24 25 23  '$ --   GLUCOSE 107* 89 84  --   BUN 24* 19 11  --   CREATININE 0.58 0.44 0.36* 0.41*  CALCIUM 8.9 8.5* 8.7*  --    GFR: Estimated Creatinine Clearance: 41.8 mL/min (A) (by C-G formula based on SCr of 0.41 mg/dL (L)). Liver Function Tests: Recent Labs  Lab 11/24/21 1633 11/25/21 0632  AST 19 14*  ALT 9 9  ALKPHOS 61 51  BILITOT 0.6 0.7  PROT 7.2 5.9*  ALBUMIN 3.0* 2.4*   No results for input(s):  "LIPASE", "AMYLASE" in the last 168 hours. No results for input(s): "AMMONIA" in the last 168 hours. Coagulation Profile: Recent Labs  Lab 11/24/21 1633  INR 1.1   Cardiac Enzymes: No results for input(s): "CKTOTAL", "CKMB", "CKMBINDEX", "TROPONINI" in the last 168 hours. BNP (last 3 results) No results for input(s): "PROBNP" in the last 8760 hours. HbA1C: No results for input(s): "HGBA1C" in the last 72 hours. CBG: No results for input(s): "GLUCAP" in the last 168 hours. Lipid Profile: No results for input(s): "CHOL", "HDL", "LDLCALC", "TRIG", "CHOLHDL", "LDLDIRECT" in the last 72 hours. Thyroid Function Tests: No results for input(s): "TSH", "T4TOTAL", "FREET4", "T3FREE", "THYROIDAB" in the last 72 hours. Anemia Panel: No results for input(s): "VITAMINB12", "FOLATE", "FERRITIN", "TIBC", "IRON", "RETICCTPCT" in the last 72 hours. Most Recent Urinalysis On File:     Component Value Date/Time   COLORURINE YELLOW (A) 11/27/2021 0405   APPEARANCEUR HAZY (A) 11/27/2021 0405   LABSPEC 1.012 11/27/2021 0405   PHURINE 6.0 11/27/2021 0405   GLUCOSEU NEGATIVE 11/27/2021 0405   HGBUR NEGATIVE 11/27/2021 0405   BILIRUBINUR NEGATIVE 11/27/2021 0405   KETONESUR NEGATIVE 11/27/2021 0405   PROTEINUR NEGATIVE 11/27/2021 0405   NITRITE NEGATIVE 11/27/2021 0405   LEUKOCYTESUR NEGATIVE 11/27/2021 0405   Sepsis Labs: '@LABRCNTIP'$ (procalcitonin:4,lacticidven:4)  Recent Results (from the past 240 hour(s))  Culture, blood (Routine x 2)     Status: None (Preliminary result)   Collection Time: 11/24/21  4:33 PM   Specimen: BLOOD  Result Value Ref Range Status   Specimen Description BLOOD RIGHT ANTECUBITAL  Final   Special Requests   Final    BOTTLES DRAWN AEROBIC AND ANAEROBIC Blood Culture adequate volume   Culture   Final    NO GROWTH 4 DAYS Performed at [facility], [address] [place], [place] [address]    Report Status PENDING  Incomplete  Culture, blood (Routine x 2)      Status: None (Preliminary result)   Collection Time: 11/24/21  4:33 PM   Specimen: BLOOD  Result Value Ref Range Status   Specimen Description BLOOD BLOOD RIGHT FOREARM  Final   Special Requests   Final  BOTTLES DRAWN AEROBIC AND ANAEROBIC Blood Culture adequate volume   Culture   Final    NO GROWTH 4 DAYS Performed at [facility], [address] [place], [place] [address]    Report Status PENDING  Incomplete  Resp Panel by RT-PCR (Flu A&B, Covid) Anterior Nasal Swab     Status: None   Collection Time: 11/24/21  6:10 PM   Specimen: Anterior Nasal Swab  Result Value Ref Range Status   SARS Coronavirus 2 by RT PCR NEGATIVE NEGATIVE Final    Comment: (NOTE) SARS-CoV-2 target nucleic acids are NOT DETECTED.  The SARS-CoV-2 RNA is generally detectable in upper respiratory specimens during the acute phase of infection. The lowest concentration of SARS-CoV-2 viral copies this assay can detect is 138 copies/mL. A negative result does not preclude SARS-Cov-2 infection and should not be used as the sole basis for treatment or other patient management decisions. A negative result may occur with  improper specimen collection/handling, submission of specimen other than nasopharyngeal swab, presence of viral mutation(s) within the areas targeted by this assay, and inadequate number of viral copies(<138 copies/mL). A negative result must be combined with clinical observations, patient history, and epidemiological information. The expected result is Negative.  Fact Sheet for Patients:  EntrepreneurPulse.com.au  Fact Sheet for Healthcare Providers:  IncredibleEmployment.be  This test is no t yet approved or cleared by the [place] FDA and  has been authorized for detection and/or diagnosis of SARS-CoV-2 by FDA under an Emergency Use Authorization (EUA). This EUA will remain  in effect (meaning this test can be used) for the duration of  the COVID-19 declaration under Section 564(b)(1) of the Act, 21 U.S.C.section 360bbb-3(b)(1), unless the authorization is terminated  or revoked sooner.       Influenza A by PCR NEGATIVE NEGATIVE Final   Influenza B by PCR NEGATIVE NEGATIVE Final    Comment: (NOTE) The Xpert Xpress SARS-CoV-2/FLU/RSV plus assay is intended as an aid in the diagnosis of influenza from Nasopharyngeal swab specimens and should not be used as a sole basis for treatment. Nasal washings and aspirates are unacceptable for Xpert Xpress SARS-CoV-2/FLU/RSV testing.  Fact Sheet for Patients: EntrepreneurPulse.com.au  Fact Sheet for Healthcare Providers: IncredibleEmployment.be  This test is not yet approved or cleared by the [place] FDA and has been authorized for detection and/or diagnosis of SARS-CoV-2 by FDA under an Emergency Use Authorization (EUA). This EUA will remain in effect (meaning this test can be used) for the duration of the COVID-19 declaration under Section 564(b)(1) of the Act, 21 U.S.C. section 360bbb-3(b)(1), unless the authorization is terminated or revoked.  Performed at [facility], [address] [place], [place] [address]          Radiology Studies: DG Foot 2 Views Left  Result Date: 11/24/2021 CLINICAL DATA:  Cellulitis. Bilateral foot redness, swelling, and serous drainage starting 2 weeks ago. On oral Keflex for 2 days. Numerous abrasions, wounds, and skin breakdown within the bilateral feet. EXAM: LEFT FOOT - 2 VIEW COMPARISON:  None Available. FINDINGS: Moderate to high-grade hallux valgus. Moderate great toe metatarsophalangeal joint space narrowing with mild peripheral degenerative osteophytes. Mild-to-moderate interphalangeal joint space narrowing diffusely. Moderate medial first tarsometatarsal joint space narrowing. Moderate peripheral lateral anterior process of the calcaneus degenerative osteophytosis at the  calcaneocuboid joint. Mild midfoot and forefoot soft tissue swelling. There are lucencies suspicious for edema and possibly air within the lateral ankle soft tissues, best seen on frontal view. There is an ulceration  of the posterior heel soft tissues apparently contacting the calcaneal heel spur at the Achilles tendon insertion. No definite cortical erosion. Small plantar calcaneal heel spur. No acute fracture or dislocation. IMPRESSION: 1. Posterior heel soft tissue ulceration coming close to a posterior calcaneal heel spur at the Achilles tendon insertion. No definite cortical erosion to suggest osteomyelitis. 2. There are low densities suspicious for edema and fluid within the lateral ankle soft tissues, best seen on frontal view. This is suspicious for cellulitis. 3. Moderate to high-grade hallux valgus. Moderate great toe metatarsophalangeal joint osteoarthritis. Electronically Signed   By: Yvonne Kendall M.D.   On: 11/24/2021 17:52   DG Foot 2 Views Right  Result Date: 11/24/2021 CLINICAL DATA:  Cellulitis. Bilateral foot redness, swelling, and serous drainage starting 2 weeks ago. On oral Keflex for 2 days. Numerous abrasions, wounds, and skin breakdown within the bilateral feet. EXAM: RIGHT FOOT - 2 VIEW COMPARISON:  None Available. FINDINGS: Two screws are seen within the proximal half of the great toe metatarsal likely status post osteotomy. Mild hallux valgus. Mild great toe metatarsophalangeal joint space narrowing. Moderate lateral great toe interphalangeal joint space narrowing. Osseous fusion of the third PIP joint. Moderate joint space narrowing of the rest of the second through fifth interphalangeal joints. Small plantar calcaneal heel spur. Mild dorsal midfoot degenerative osteophytes. Moderate midfoot and forefoot soft tissue swelling. No cortical erosion is seen. No subcutaneous emphysema. IMPRESSION: 1. Moderate midfoot and forefoot soft tissue swelling. No radiographic evidence of  osteomyelitis. 2. Mild hallux valgus and great toe metatarsophalangeal joint osteoarthritis. Electronically Signed   By: Yvonne Kendall M.D.   On: 11/24/2021 17:47   DG Chest Port 1 View  Result Date: 11/24/2021 CLINICAL DATA:  Sepsis EXAM: PORTABLE CHEST 1 VIEW COMPARISON:  Radiograph 11/04/2019 FINDINGS: Unchanged cardiomediastinal silhouette. Faint right mid lung and lingular opacities. No pleural effusion or pneumothorax. No acute osseous abnormality. Thoracic spondylosis. IMPRESSION: Faint right mid lung and lingular opacities, possibly developing infection or atelectasis. PA and lateral chest radiograph would be useful, if able. Electronically Signed   By: Maurine Simmering M.D.   On: 11/24/2021 16:48            LOS: 4 days       Emeterio Reeve, DO [facility] 11/28/2021, 2:45 PM    Dictation software may have been used to generate the above note. Typos may occur and escape review in typed/dictated notes. Please contact Dr Sheppard Coil directly for clarity if needed.  Staff may message me via secure chat in [place]  but this may not receive an immediate response,  please page me for urgent matters!  If 7PM-7AM, please contact night coverage [email or web address]

## 2021-11-28 NOTE — NC FL2 (Signed)
[place] LEVEL OF CARE SCREENING TOOL     IDENTIFICATION  Patient Name: [name] [name] Birthdate: [birth year] Sex: female Admission Date (Current Location): 11/24/2021  [facility] and [place] Number:  Engineering geologist and Address:  [facility], [address], [place], [place] [address]      Provider Number: [id number]  Attending Physician Name and Address:  Emeterio Reeve, DO  Relative Name and Phone Number:  Hulan Fess [phone]    Current Level of Care: Hospital Recommended Level of Care: [place] Prior Approval Number:    Date Approved/Denied:   PASRR Number: [id number]  Discharge Plan: SNF    Current Diagnoses: Patient Active Problem List   Diagnosis Date Noted   Bilateral lower leg cellulitis 11/24/2021   Cancer associated pain 10/06/2021   Squamous cell carcinoma of buccal mucosa ([place]) 09/16/2020   Hyponatremia 11/05/2019   Generalized weakness 11/05/2019   COPD with acute exacerbation ([place]) 11/04/2019   Fibromyositis 06/12/2017   Pain 06/12/2017   Healthcare maintenance 01/17/2017   Anemia 05/30/2016   Angiodysplasia of intestinal tract    Blood in stool    Benign neoplasm of ascending colon    UGIB (upper gastrointestinal bleed)    GIB (gastrointestinal bleeding) 04/12/2016   Melena    Healthcare-associated pertussis 10/10/2015   Healthcare-associated pneumonia 10/10/2015   GI bleed 08/10/2015   Iron deficiency anemia due to chronic blood loss 08/10/2015   HTN (hypertension), benign 08/10/2015   HLD (hyperlipidemia) 08/10/2015   GERD (gastroesophageal reflux disease) 08/10/2015   Chronic obstructive pulmonary disease ([place]) 08/10/2015   Glaucoma suspect 12/26/2012   Fatigue 02/12/2012   H/O total hip arthroplasty 01/25/2012   Total knee replacement status 01/25/2012   Arthritis of knee 01/18/2012   ARMD (age related macular degeneration) 11/21/2011   Foot drop 11/15/2011    Hallux valgus 11/15/2011   Hammer toe, acquired 11/15/2011   Recurrent major depressive disorder, in partial remission ([place]) 07/10/2011   Degenerative disc disease 04/11/2011   Fibromyalgia 02/10/2011   Diverticulosis of colon 07/18/2010   Gastric ulcer 07/18/2010   Hereditary and idiopathic peripheral neuropathy 07/18/2010   Meniere's disease 07/18/2010   Osteoarthritis, generalized 07/18/2010   Hemorrhoids 07/18/2010    Orientation RESPIRATION BLADDER Height & Weight     Self, Time, Situation, Place  Normal Continent Weight: 67.6 kg Height:  '5\' 7"'$  (170.2 cm)  BEHAVIORAL SYMPTOMS/MOOD NEUROLOGICAL BOWEL NUTRITION STATUS      Continent Diet (see dc summary)  AMBULATORY STATUS COMMUNICATION OF NEEDS Skin   Extensive Assist Verbally Normal, Skin abrasions                       Personal Care Assistance Level of Assistance  Bathing, Feeding, Dressing Bathing Assistance: Maximum assistance Feeding assistance: Limited assistance Dressing Assistance: Maximum assistance     Functional Limitations Info             SPECIAL CARE FACTORS FREQUENCY  PT (By licensed PT), OT (By licensed OT)     PT Frequency: 5 times per week OT Frequency: 5 times per week            Contractures Contractures Info: Not present    Additional Factors Info  Code Status, Allergies Code Status Info: DNR Allergies Info: Nsaids, Dextrans, Fentanyl, Lipitor (Atorvastatin), Lovastatin, Mobic (Meloxicam), Niacin And Related, Other, Polysaccharide K, Pravachol (Pravastatin Sodium), Statins, Tolmetin, Voltaren (Diclofenac Sodium), Zetia (Ezetimibe), Codeine, Niacin  Current Medications (11/28/2021):  This is the current hospital active medication list Current Facility-Administered Medications  Medication Dose Route Frequency Provider Last Rate Last Admin   acetaminophen (TYLENOL) tablet 650 mg  650 mg Oral Q6H PRN Hugelmeyer, Alexis, DO   650 mg at 11/27/21 1526   Or   acetaminophen  (TYLENOL) suppository 650 mg  650 mg Rectal Q6H PRN Hugelmeyer, Alexis, DO       acidophilus (RISAQUAD) capsule 1 capsule  1 capsule Oral Daily Hugelmeyer, Alexis, DO   1 capsule at 11/28/21 1104   bisacodyl (DULCOLAX) EC tablet 5 mg  5 mg Oral Daily PRN Hugelmeyer, Alexis, DO       ceFEPIme (MAXIPIME) 2 g in sodium chloride 0.9 % 100 mL IVPB  2 g Intravenous Q12H Renda Rolls, RPH 200 mL/hr at 11/28/21 1110 2 g at 11/28/21 1110   cyanocobalamin (VITAMIN B12) tablet 2,000 mcg  2,000 mcg Oral Daily Hugelmeyer, Alexis, DO   2,000 mcg at 11/28/21 1105   enoxaparin (LOVENOX) injection 40 mg  40 mg Subcutaneous Q24H Hugelmeyer, Alexis, DO   40 mg at 11/27/21 2026   escitalopram (LEXAPRO) tablet 20 mg  20 mg Oral Daily Hugelmeyer, Alexis, DO   20 mg at 11/28/21 1104   fluticasone (FLONASE) 50 MCG/ACT nasal spray 1 spray  1 spray Each Nare Daily Hugelmeyer, Alexis, DO   1 spray at 11/28/21 1106   gabapentin (NEURONTIN) capsule 500 mg  500 mg Oral TID Emeterio Reeve, DO   500 mg at 11/28/21 1104   hydrALAZINE (APRESOLINE) injection 5 mg  5 mg Intravenous Q6H PRN Hugelmeyer, Alexis, DO       latanoprost (XALATAN) 0.005 % ophthalmic solution 1 drop  1 drop Both Eyes QHS Hugelmeyer, Alexis, DO   1 drop at 11/27/21 2026   lidocaine (XYLOCAINE) 2 % viscous mouth solution 15 mL  15 mL Mouth/Throat PRN Hugelmeyer, Alexis, DO   15 mL at 11/26/21 1424   lidocaine-prilocaine (EMLA) cream   Topical Q4H PRN Emeterio Reeve, DO       losartan (COZAAR) tablet 50 mg  50 mg Oral Daily Hugelmeyer, Alexis, DO   50 mg at 11/28/21 1105   mometasone (ELOCON) 0.1 % cream 1 Application  1 Application Topical Daily PRN Hugelmeyer, Alexis, DO       morphine (PF) 2 MG/ML injection 1 mg  1 mg Intravenous Q4H PRN Emeterio Reeve, DO   1 mg at 11/28/21 0558   ondansetron (ZOFRAN) tablet 4 mg  4 mg Oral Q6H PRN Hugelmeyer, Alexis, DO       Or   ondansetron (ZOFRAN) injection 4 mg  4 mg Intravenous Q6H PRN Hugelmeyer,  Alexis, DO       oxyCODONE (Oxy IR/ROXICODONE) immediate release tablet 5 mg  5 mg Oral Q4H PRN Hugelmeyer, Alexis, DO   5 mg at 11/27/21 1250   oxyCODONE (OXYCONTIN) 12 hr tablet 15 mg  15 mg Oral Q12H Hugelmeyer, Alexis, DO   15 mg at 11/28/21 1103   pantoprazole (PROTONIX) EC tablet 40 mg  40 mg Oral Daily Hugelmeyer, Alexis, DO   40 mg at 11/28/21 1104   potassium chloride (KLOR-CON M) CR tablet 10 mEq  10 mEq Oral TID Hugelmeyer, Alexis, DO   10 mEq at 11/28/21 1104   senna-docusate (Senokot-S) tablet 1 tablet  1 tablet Oral QHS PRN Hugelmeyer, Alexis, DO       silver sulfADIAZINE (SILVADENE) 1 % cream   Topical BID Emeterio Reeve, DO  Given at 11/28/21 1107   sodium chloride flush (NS) 0.9 % injection 3 mL  3 mL Intravenous Q12H Hugelmeyer, Alexis, DO   3 mL at 11/28/21 1107   timolol (TIMOPTIC) 0.5 % ophthalmic solution 1 drop  1 drop Both Eyes Daily Hugelmeyer, Alexis, DO   1 drop at 11/28/21 1103   traZODone (DESYREL) tablet 25 mg  25 mg Oral QHS PRN Hugelmeyer, Alexis, DO   25 mg at 11/26/21 2119   valACYclovir (VALTREX) tablet 500 mg  500 mg Oral BID Emeterio Reeve, DO   500 mg at 11/28/21 1104   Facility-Administered Medications Ordered in Other Encounters  Medication Dose Route Frequency Provider Last Rate Last Admin   0.9 %  sodium chloride infusion   Intravenous Continuous Sindy Guadeloupe, MD 0 mL/hr at 10/08/17 1518 New Bag at 06/21/18 7366     Discharge Medications: Please see discharge summary for a list of discharge medications.  Relevant Imaging Results:  Relevant Lab Results:   Additional Information [id number]  Conception Oms, RN

## 2021-11-28 NOTE — Evaluation (Signed)
Occupational Therapy Evaluation Patient Details Name: [name] [name] MRN: [id number] DOB: 1925-05-24 Today's Date: 11/28/2021   History of Present Illness Patient states that she is on chemotherapy; began having sores that she thought were related to chemo; increasing difficulty with IADLs and mobility recently 2/2 painful feet and sores in mouth and on body. Denies falls (states most recent fall was 2 years ago); now requiring assistance with IADLs and supervision showering; assistance daily several hours per day (pt unable to provide exact amout).   Clinical Impression   Patient received for OT evaluation. See flowsheet below for details of function. Generally, patient requiring SBA for bed mobility, RW for functional transfers (not safe to step away from the bed with OT today), and MIN-MOD A for ADLs. Pt limited by pain from back sores and in BIL feet (R in particular) and decreased balance and decreased activity tolerance. Patient will benefit from continued OT while in acute care.      Recommendations for follow up therapy are one component of a multi-disciplinary discharge planning process, led by the attending physician.  Recommendations may be updated based on patient status, additional functional criteria and insurance authorization.   Follow Up Recommendations  Skilled nursing-short term rehab (<3 hours/day)     Assistance Recommended at Discharge Frequent or constant Supervision/Assistance  Patient can return home with the following A lot of help with walking and/or transfers;A lot of help with bathing/dressing/bathroom;Assistance with cooking/housework;Direct supervision/assist for medications management;Direct supervision/assist for financial management;Assist for transportation;Help with stairs or ramp for entrance    Functional Status Assessment  Patient has had a recent decline in their functional status and demonstrates the ability to make significant improvements in  function in a reasonable and predictable amount of time.  Equipment Recommendations  Other (comment) (defer to next level of care;)    Recommendations for Other Services       Precautions / Restrictions Precautions Precautions: Fall Restrictions Weight Bearing Restrictions: No      Mobility Bed Mobility Overal bed mobility: Needs Assistance Bed Mobility: Supine to Sit     Supine to sit: Min guard, HOB elevated          Transfers Overall transfer level: Needs assistance Equipment used: Rolling walker (2 wheels) Transfers: Sit to/from Stand, Bed to chair/wheelchair/BSC Sit to Stand: Min guard, Min assist     Step pivot transfers: Min guard (with RW)     General transfer comment: Upon first stand, pt lost balance posteriorly, tried to reach for OT's arm, and sat back down uncontrolled on edge of bed. OT provided cues for pt to place BIL hands on RW once in standing position and second t/f to standing CGA-MIN A was improved. However, pt continued to state that her legs felt "wobbly" and off balance; defered mobility away from bed due to safety, and had pt t/f to other side of bed to SPT to chair next to bed.      Balance Overall balance assessment: Needs assistance Sitting-balance support: Feet supported, No upper extremity supported Sitting balance-Leahy Scale: Good Sitting balance - Comments: Able to sit and make figure four for donning socks without losing balance   Standing balance support: Bilateral upper extremity supported, Reliant on assistive device for balance Standing balance-Leahy Scale: Fair Standing balance comment: Pt feeling "wobbly" while standing.                           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 Grooming: Set up;Sitting (anticipated; unsafe to stand today for grooming; pt reporting legs feeling "wobbly")   Upper Body Bathing: Minimal assistance;Sitting (anticipated; multiple sores on body)   Lower Body Bathing: Moderate assistance;Sitting/lateral leans (anticipated)   Upper Body Dressing : Set up;Sitting (anticipated)   Lower Body Dressing: Minimal assistance;Sitting/lateral leans (pt donned socks today at EOB in figure four position; required assistance to twist/align socks on feet so that non-skid area was on the bottom.)   Toilet Transfer: Min guard;Stand-pivot;Rolling walker (2 wheels) (anticipate pt will require BSC use for safety; used RW to simulate this transfer today with CGA to recliner chair. Pt states she almost fell with nursing earlier today doing a transfer to [facility] next to bed.)   Toileting- Clothing Manipulation and Hygiene: Minimal assistance;Sitting/lateral lean (anticipated)   Tub/ Shower Transfer: Minimal assistance;Stand-pivot;Rolling walker (2 wheels) (anticipated; only safe for walk-in shower)   Functional mobility during ADLs: Min guard;Minimal assistance;Rolling walker (2 wheels) General ADL Comments: Pt unable to safely step away from the bed today 2/2 feeling very unsteady, even with RW; able to stand and sidestep/turn to recliner chair next to bed. Pt reporting increase in pain in RLE with movement. States that her shingles wounds are very itchy. Patient states that at baseline she enjoys gardening; has been unable to do so this year, but did plant a few flowers in pots on a table in her backyard.     Vision Baseline Vision/History: 1 Wears glasses;6 Macular Degeneration Ability to See in Adequate Light: 2 Moderately impaired Patient Visual Report: No change from baseline Additional Comments: Has baseline macular degeneration; states she cannot read anymore, but enjoys listening to TV shows.     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)   Extremity/Trunk Assessment Upper Extremity Assessment Upper Extremity Assessment: Overall WFL for tasks assessed   Lower Extremity Assessment Lower Extremity Assessment: Generalized weakness;Defer to PT evaluation       Communication Communication Communication: HOH   Cognition Arousal/Alertness: Awake/alert Behavior During Therapy: WFL for tasks assessed/performed Overall Cognitive Status: Within Functional Limits for tasks assessed                                 General Comments: Patient unable to provide some details, such as how many hours per day her caregiver Jeani Hawking is coming. Suspect some possible underlying higher level cognitive deficits     General Comments       Exercises     Shoulder Instructions      Home Living Family/patient expects to be discharged to:: Private residence (independent living at [facility]) Living Arrangements: Alone Available Help at Discharge: Available PRN/intermittently;Personal care attendant Jeani Hawking CNA assisting with meals, laundry, housekeeping, supervision showering; pt unable to specify exactly how many hours Jeani Hawking is coming, but states she comes over "every day".) Type of Home: House Home Access: Level entry     Home Layout: One level     Bathroom Shower/Tub: Occupational psychologist: Handicapped height Bathroom Accessibility: Yes How Accessible: Accessible via wheelchair [place]: Rollator (4 wheels);Shower  seat;Grab bars - toilet;Grab bars - tub/shower;Hand held shower head Management consultant)   Additional Comments: Patient lives in [place] home in [facility]. Unclear if CNA Jeani Hawking is private pay or covered or family/friend.      Prior Functioning/Environment Prior Level of Function : Needs assist  Cognitive Assist :  (Need to further assess)      Physical Assist : Mobility (physical);ADLs (physical)   ADLs (physical): Bathing;IADLs Mobility Comments: 2 weeks ago started having difficulty walking due to feet hurting. Pt uses rollator at baseline. Increased assistance with mobility past two weeks. Pt denies recent falls. ADLs Comments: Patient states that she has supervision for showering from caregiver Jeani Hawking. Jeani Hawking also assists with nearly all IADLs around the house.        OT Problem List: Decreased strength;Decreased activity tolerance;Impaired balance (sitting and/or standing)      OT Treatment/Interventions: Self-care/ADL training;Therapeutic exercise;Therapeutic activities;Patient/family education    OT Goals(Current goals can be found in the care plan section) Acute Rehab OT Goals Patient Stated Goal: Get back to doing her flowers OT Goal Formulation: With patient Time For Goal Achievement: 12/12/21 Potential to Achieve Goals: Good ADL Goals Pt Will Perform Grooming: with modified independence;standing Pt Will Perform Lower Body Dressing: with modified independence;sit to/from stand Pt Will Transfer to Toilet: with modified independence;ambulating;grab bars Pt Will Perform Toileting - Clothing Manipulation and hygiene: with modified independence;sit to/from stand  OT Frequency: Min 2X/week    Co-evaluation              AM-PAC OT "6 Clicks" Daily Activity     Outcome Measure Help from another person eating meals?: None Help from another person taking care of personal grooming?: A Little Help from another person toileting, which includes using toliet, bedpan, or urinal?: A Lot Help from another person bathing (including washing, rinsing, drying)?: A Lot Help from another person to put on and taking off regular upper body clothing?: A Little Help from another person to put on and taking off regular lower body clothing?: A Little 6 Click Score: 17   End of Session Equipment Utilized During Treatment: Rolling walker  (2 wheels);Gait belt Nurse Communication: Mobility status  Activity Tolerance: Patient limited by fatigue;Patient limited by pain Patient left: in chair;with call bell/phone within reach;with chair alarm set  OT Visit Diagnosis: Unsteadiness on feet (R26.81)                Time: 9379-0240 OT Time Calculation (min): 59 min Charges:  OT General Charges $OT Visit: 1 Visit OT Evaluation $OT Eval Moderate Complexity: 1 Mod OT Treatments $Self Care/Home Management : 8-22 mins $Therapeutic Activity: 23-37 mins  Waymon Amato, MS, OTR/L   Vania Rea 11/28/2021, 1:58 PM

## 2021-11-28 NOTE — Evaluation (Signed)
Physical Therapy Evaluation Patient Details Name: [name] [name] MRN: [id number] DOB: Mar 27, 1925 Today's Date: 11/28/2021  History of Present Illness  Pt is a 88 y.o. female presenting to hospital 11/24/21 with c/o redness, wounds, and swelling to B feet; diagnosed with cellulitis a few days prior.  Pt admitted with sepsis secondary to B LE cellultis, squamous cell carcinoma of buccal mucosa, immune compromise d/t chemo/radiation, rash concerning for shingles likely precipitated by stress and immune compromise.  Sores in mouth and on body.  PMH includes htn, HLD, COPD, GERD, iron deficiency anemia, head and neck CA.   Clinical Impression  Prior to hospital admission, pt was ambulatory (recently using rollator and requiring assist/limited mobility last 2 weeks); lives at [facility] independent living.  Pt resting in recliner upon PT arrival; agreeable to PT session; pt's caregiver (CNA) present.  During session pt c/o back and B feet pain d/t sores (6/10 end of session but pt declined pain meds).  Pt min assist with transfers using RW (pt utilizing momentum); CGA to ambulate a few feet recliner to bed with RW use; and CGA sit to supine in bed.  Limited mobility/ambulation d/t B feet pain.  Pt would benefit from skilled PT to address noted impairments and functional limitations (see below for any additional details).  Upon hospital discharge, pt would benefit from SNF.      Recommendations for follow up therapy are one component of a multi-disciplinary discharge planning process, led by the attending physician.  Recommendations may be updated based on patient status, additional functional criteria and insurance authorization.  Follow Up Recommendations Skilled nursing-short term rehab (<3 hours/day) Can patient physically be transported by private vehicle: No    Assistance Recommended at Discharge Frequent or constant Supervision/Assistance  Patient can return home with the following  A little  help with walking and/or transfers;A little help with bathing/dressing/bathroom;Assistance with cooking/housework;Assist for transportation;Help with stairs or ramp for entrance    Equipment Recommendations Rolling walker (2 wheels);BSC/3in1;Wheelchair (measurements PT);Wheelchair cushion (measurements PT)  Recommendations for Other Services       Functional Status Assessment Patient has had a recent decline in their functional status and demonstrates the ability to make significant improvements in function in a reasonable and predictable amount of time.     Precautions / Restrictions Precautions Precautions: Fall Precaution Comments: Legally blind; HOH (has hearing aides) Restrictions Weight Bearing Restrictions: No      Mobility  Bed Mobility Overal bed mobility: Needs Assistance Bed Mobility: Sit to Supine       Sit to supine: Min guard, HOB elevated   General bed mobility comments: increased effort to perform on own    Transfers Overall transfer level: Needs assistance Equipment used: Rolling walker (2 wheels) Transfers: Sit to/from Stand Sit to Stand: Min assist           General transfer comment: use of momentum to stand (x2 trials required in order to come to standing); vc's for UE/LE placement    Ambulation/Gait Ambulation/Gait assistance: Min guard Gait Distance (Feet): 3 Feet Assistive device: Rolling walker (2 wheels)   Gait velocity: decreased     General Gait Details: increased effort to take steps; limited d/t B feet pain  Stairs            Wheelchair Mobility    Modified Rankin (Stroke Patients Only)       Balance Overall balance assessment: Needs assistance Sitting-balance support: Feet supported, No upper extremity supported Sitting balance-Leahy Scale: Good Sitting balance -  Comments: steady sitting reaching within BOS   Standing balance support: Bilateral upper extremity supported, Reliant on assistive device for  balance Standing balance-Leahy Scale: Poor Standing balance comment: intermittent assist for balance in standing                             Pertinent Vitals/Pain Pain Assessment Pain Assessment: 0-10 Pain Score: 6  Pain Location: back/side and feet from sores Pain Descriptors / Indicators: Aching, Tender, Guarding, Grimacing Pain Intervention(s): Limited activity within patient's tolerance, Monitored during session, Premedicated before session, Repositioned, Other (comment) (pt declining pain meds--nurse notified)    Home Living Family/patient expects to be discharged to:: Private residence (Independent living [facility]) Living Arrangements: Alone Available Help at Discharge: Available PRN/intermittently;Personal care attendant (10-2pm and 4-6pm 7 days a week) Type of Home: House Home Access: Level entry       Home Layout: One level Home Equipment: Rollator (4 wheels);Shower seat;Grab bars - toilet;Grab bars - tub/shower;Hand held shower head Management consultant) Additional Comments: Hired CNA assist Jeani Hawking and another CNA assist).    Prior Function Prior Level of Function : Needs assist  Cognitive Assist :  (Need to further assess)     Physical Assist : Mobility (physical);ADLs (physical)   ADLs (physical): Bathing;IADLs Mobility Comments: Independent with ambulation but recently requiring rollator and limited distance ambulating d/t feet hurting last 2 weeks.  No recent falls. ADLs Comments: Per OT eval "Patient states that she has supervision for showering from caregiver Jeani Hawking. Jeani Hawking also assists with nearly all IADLs around the house."     Hand Dominance   Dominant Hand: Right    Extremity/Trunk Assessment   Upper Extremity Assessment Upper Extremity Assessment: Overall WFL for tasks assessed    Lower Extremity Assessment Lower Extremity Assessment: Generalized weakness (at least 2/5 B DF (limited d/t pain in feet per pt report))    Cervical / Trunk  Assessment Cervical / Trunk Assessment: Normal  Communication   Communication: HOH  Cognition Arousal/Alertness: Awake/alert Behavior During Therapy: WFL for tasks assessed/performed Overall Cognitive Status: Within Functional Limits for tasks assessed                                 General Comments: Pt not always direct with answers.        General Comments  Nursing cleared pt for participation in physical therapy.  Pt agreeable to PT session.  Pt's caregiver Jeani Hawking present during session.    Exercises     Assessment/Plan    PT Assessment Patient needs continued PT services  PT Problem List Decreased strength;Decreased activity tolerance;Decreased balance;Decreased mobility;Decreased knowledge of use of DME;Pain;Decreased skin integrity       PT Treatment Interventions DME instruction;Gait training;Functional mobility training;Therapeutic activities;Therapeutic exercise;Balance training;Patient/family education    PT Goals (Current goals can be found in the Care Plan section)  Acute Rehab PT Goals Patient Stated Goal: to improve strength, mobility, and pain PT Goal Formulation: With patient Time For Goal Achievement: 12/12/21 Potential to Achieve Goals: Good    Frequency Min 2X/week     Co-evaluation               AM-PAC PT "6 Clicks" Mobility  Outcome Measure Help needed turning from your back to your side while in a flat bed without using bedrails?: None Help needed moving from lying on your back to sitting on the  side of a flat bed without using bedrails?: A Little Help needed moving to and from a bed to a chair (including a wheelchair)?: A Little Help needed standing up from a chair using your arms (e.g., wheelchair or bedside chair)?: A Little Help needed to walk in hospital room?: A Little Help needed climbing 3-5 steps with a railing? : A Lot 6 Click Score: 18    End of Session   Activity Tolerance: Patient limited by pain Patient left:  in bed;with call bell/phone within reach;with bed alarm set;with family/visitor present;Other (comment) (B LE's elevated on 2 pillows) Nurse Communication: Mobility status;Precautions;Other (comment) (pt's pain status) PT Visit Diagnosis: Unsteadiness on feet (R26.81);Muscle weakness (generalized) (M62.81);History of falling (Z91.81);Pain Pain - Right/Left:  (B) Pain - part of body: Ankle and joints of foot    Time: 1340-1407 PT Time Calculation (min) (ACUTE ONLY): 27 min   Charges:   PT Evaluation $PT Eval Low Complexity: 1 Low          Eligio Angert, PT 11/28/21, 2:46 PM

## 2021-11-28 NOTE — TOC Progression Note (Signed)
Transition of Care [facility]) - Progression Note    Patient Details  Name: [name] [name] MRN: [id number] Date of Birth: 09-23-1925  [facility]) CM/SW [place], RN Phone Number: 11/28/2021, 1:06 PM  Clinical Narrative:    Reached out to Seth Bake at [facility] to let her know the patient will need STR        Expected Discharge Plan and Services                                                 Social Determinants of Health (SDOH) Interventions    Readmission Risk Interventions     No data to display

## 2021-11-28 NOTE — Progress Notes (Signed)
Pharmacy Antibiotic Note  [name] [name] is a 88 y.o. female admitted on 11/24/2021 with sepsis.  Pharmacy has been consulted for Cefepime & Vancomycin dosing.  Plan: Continue Cefepime 2 gm IVPB q12hr  Pharmacy will continue to follow and will adjust abx dosing whenever warranted.  Temp (24hrs), Avg:98.6 F (37 C), Min:98.2 F (36.8 C), Max:98.9 F (37.2 C)   Recent Labs  Lab 11/24/21 1633 11/24/21 2043 11/25/21 0632 11/26/21 0412 11/27/21 0556  WBC 16.2*  --  8.4 7.7  --   CREATININE 0.58  --  0.44 0.36* 0.41*  LATICACIDVEN 0.8 0.9  --   --   --      Estimated Creatinine Clearance: 41.8 mL/min (A) (by C-G formula based on SCr of 0.41 mg/dL (L)).    Allergies  Allergen Reactions   Nsaids Hives   Dextrans Hives   Fentanyl Itching   Lipitor [Atorvastatin] Other (See Comments)    Reaction: Muscle pain   Lovastatin    Mobic [Meloxicam] Other (See Comments)    Reaction: Mouth and tongue ulcers   Niacin And Related Itching   Other    Polysaccharide K Hives   Pravachol [Pravastatin Sodium] Other (See Comments)    Reaction: Muscle pain   Statins Other (See Comments)   Tolmetin Hives   Voltaren [Diclofenac Sodium] Other (See Comments)    Reaction: Mouth and tongue ulcers   Zetia [Ezetimibe] Other (See Comments)    Reaction: Leg pain   Codeine Nausea And Vomiting   Niacin Itching    Antimicrobials this admission: 11/16 Cefepime >>  11/16 Vancomycin >> 11/19  Microbiology results: 11/16 BCx: NGTD  Thank you for allowing pharmacy to be a part of this patient's care.

## 2021-11-28 NOTE — Plan of Care (Signed)
  Problem: Clinical Measurements: Goal: Will remain free from infection Outcome: Progressing   Problem: Clinical Measurements: Goal: Diagnostic test results will improve Outcome: Progressing   Problem: Activity: Goal: Risk for activity intolerance will decrease Outcome: Progressing   Problem: Nutrition: Goal: Adequate nutrition will be maintained Outcome: Progressing   

## 2021-11-29 ENCOUNTER — Encounter: Payer: Self-pay | Admitting: Student

## 2021-11-29 ENCOUNTER — Encounter: Payer: Self-pay | Admitting: Oncology

## 2021-11-29 DIAGNOSIS — L03115 Cellulitis of right lower limb: Secondary | ICD-10-CM | POA: Diagnosis not present

## 2021-11-29 DIAGNOSIS — L03116 Cellulitis of left lower limb: Secondary | ICD-10-CM | POA: Diagnosis not present

## 2021-11-29 LAB — BASIC METABOLIC PANEL
Anion gap: 3 — ABNORMAL LOW (ref 5–15)
BUN: 8 mg/dL (ref 8–23)
CO2: 28 mmol/L (ref 22–32)
Calcium: 8.6 mg/dL — ABNORMAL LOW (ref 8.9–10.3)
Chloride: 104 mmol/L (ref 98–111)
Creatinine, Ser: 0.38 mg/dL — ABNORMAL LOW (ref 0.44–1.00)
GFR, Estimated: >60 mL/min (ref >60)
Glucose, Bld: 90 mg/dL (ref 70–99)
Potassium: 3.9 mmol/L (ref 3.5–5.1)
Sodium: 135 mmol/L (ref 135–145)

## 2021-11-29 LAB — CBC
HCT: 27.2 % — ABNORMAL LOW (ref 36.0–46.0)
Hemoglobin: 9 g/dL — ABNORMAL LOW (ref 12.0–15.0)
MCH: 26.5 pg (ref 26.0–34.0)
MCHC: 33.1 g/dL (ref 30.0–36.0)
MCV: 80.2 fL (ref 80.0–100.0)
Platelets: 443 10*3/uL — ABNORMAL HIGH (ref 150–400)
RBC: 3.39 MIL/uL — ABNORMAL LOW (ref 3.87–5.11)
RDW: 14.7 % (ref 11.5–15.5)
WBC: 6 10*3/uL (ref 4.0–10.5)
nRBC: 0 % (ref 0.0–0.2)

## 2021-11-29 LAB — CULTURE, BLOOD (ROUTINE X 2)
Culture: NO GROWTH
Culture: NO GROWTH
Special Requests: ADEQUATE
Special Requests: ADEQUATE

## 2021-11-29 MED ORDER — LIDOCAINE-PRILOCAINE 2.5-2.5 % EX CREA: TOPICAL_CREAM | CUTANEOUS | 0 refills | Status: DC | PRN

## 2021-11-29 MED ORDER — OXYCODONE HCL 5 MG PO TABS: 5.0000 mg | ORAL_TABLET | ORAL | 0 refills | Status: DC | PRN

## 2021-11-29 MED ORDER — VALACYCLOVIR HCL 500 MG PO TABS: 500.0000 mg | ORAL_TABLET | Freq: Two times a day (BID) | ORAL | 0 refills | Status: AC

## 2021-11-29 MED ORDER — OXYCONTIN 15 MG PO T12A: 15.0000 mg | EXTENDED_RELEASE_TABLET | Freq: Two times a day (BID) | ORAL | 0 refills | Status: DC

## 2021-11-29 MED ORDER — SILVER SULFADIAZINE 1 % EX CREA: TOPICAL_CREAM | Freq: Two times a day (BID) | CUTANEOUS | 0 refills | Status: DC

## 2021-11-29 MED ORDER — CEFDINIR 300 MG PO CAPS: 300.0000 mg | ORAL_CAPSULE | Freq: Two times a day (BID) | ORAL | 0 refills | Status: AC

## 2021-11-29 MED ORDER — BISACODYL 5 MG PO TBEC: 5.0000 mg | DELAYED_RELEASE_TABLET | Freq: Every day | ORAL | 0 refills | Status: DC | PRN

## 2021-11-29 MED ORDER — GABAPENTIN 300 MG PO CAPS: 300.0000 mg | ORAL_CAPSULE | Freq: Three times a day (TID) | ORAL | 0 refills | Status: DC

## 2021-11-29 NOTE — TOC Progression Note (Signed)
Transition of Care [facility]) - Progression Note    Patient Details  Name: [name] [name] MRN: [id number] Date of Birth: [birth year]  Transition of Care [facility]) CM/SW [place], RN Phone Number: 11/29/2021, 3:22 PM  Clinical Narrative:     Baruch Goldmann the Son in law and notified him of the room number at [place] to room [facility] to arrange transport   Expected Discharge Plan: [place] Barriers to Discharge: SNF Pending bed offer, Insurance Authorization  Expected Discharge Plan and Services Expected Discharge Plan: [place] arrangements for the past 2 months: Single Family Home Expected Discharge Date: 11/29/21                                     Social Determinants of Health (SDOH) Interventions    Readmission Risk Interventions     No data to display

## 2021-11-29 NOTE — Progress Notes (Unsigned)
Provider:  Dr. Dewayne Shorter Location:  [facility].  Nursing Home Room Number: [place] 101A Place of Service:  SNF (31)  PCP: Dewayne Shorter, MD Patient Care Team: Dewayne Shorter, MD as PCP - General (Family Medicine) Sindy Guadeloupe, MD as Consulting Physician (Hematology and Oncology)  Extended Emergency Contact Information Primary Emergency Contact: Lavina Hamman [place] of [place] Phone: [phone] Mobile Phone: [phone] Relation: Son Secondary Emergency Contact: Sanz,[name] Address: [address]          [place], [place] [address] Johnnette Litter of [place] Phone: [phone] Mobile Phone: [phone] Relation: Son  Code Status: DNR Goals of Care: Advanced Directive information    11/30/2021    9:55 AM  Advanced Directives  Does Patient Have a Medical Advance Directive? Yes  Type of Paramedic of [place];Out of facility DNR (pink MOST or yellow form);Living will  Does patient want to make changes to medical advance directive? No - Patient declined  Copy of [place] in Chart? Yes - validated most recent copy scanned in chart (See row information)      Chief Complaint  Patient presents with   New Admit To SNF    New Admit    HPI: Patient is a 88 y.o. female seen today for admission to [facility] She went to the hospital from head to foot due to a rash that itches, burns, and hurts. Her feet got infected as well. Denies fever/chills, nausea/vomiting. She had diarrhea with her chemo, but not now. She has not had blood in her stool. She has pain all over her body and it's "unbarable at times." She has been on oxycontin for a long time. She doesn't want the pain to get too bad because it's hard to control. She takes the other pain medication every 4 hours. She still has a rash and it still itches. She has lost a lot of weight.   She has a low appetite has been low for a while.  Her mouth was so sore she couldn't eat. She had blisters in her mouth to start with. She still has some blisters. Her SIL who was a dentist mixed a concoction for her to rub in her mouth where it's painful.   She had radiation and her mouth has been bad ever since. She is in chemo now at [facility] Diarrhea is supposed ot be the only side effect. She has had thre etreatments and is going for the 4th one soon. This is for oral cancer.   She had a history of lichen planus and thinks that transitioned into cancer.   At home she uses a rollator. She has a wheelchair since this all has started. She cooked and clean for herself until a few months ago. She gets help with grocery shopping. She hasn't had any recent falls a home. She was previously independent with self care and medications.  She hopes to get to the point where she can go home and take care of herself.   She is from [facility]. She has lived at [facility] for 8 years. She has 2 adopted children and her daughter died 3 years ago.   Per her son in law, She has been maalox and zyolocaine together.   This all started the first week of October. 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 There were blisters on her feet initially very large blisters.   Her torso is difficult to say exactly when that started.  Past Medical History:  Diagnosis Date   Actinic keratosis    Anemia    COPD (chronic obstructive pulmonary disease) (HCC)    Diverticulitis    GERD (gastroesophageal reflux disease)    Glaucoma    HLD (hyperlipidemia)    Hypertension    Iron deficiency    Rectal bleeding    Skin cancer    Nose, txted by Dr. Valere Dross at [facility]   Past Surgical History:  Procedure Laterality Date   ABDOMINAL HYSTERECTOMY     APPENDECTOMY     COLONOSCOPY WITH PROPOFOL N/A 04/14/2016   Procedure: COLONOSCOPY WITH PROPOFOL;  Surgeon: Lucilla Lame, MD;  Location: [facility] ENDOSCOPY;  Service: Endoscopy;  Laterality: N/A;   ENTEROSCOPY N/A  05/10/2016   Procedure: Push ENTEROSCOPY with pediatric colonoscope;  Surgeon: [name], MD;  Location: [facility] ENDOSCOPY;  Service: Endoscopy;  Laterality: N/A;   ENTEROSCOPY N/A 05/30/2016   Procedure: push ENTEROSCOPY;  Surgeon: [name], MD;  Location: [facility] ENDOSCOPY;  Service: Endoscopy;  Laterality: N/A;   ENTEROSCOPY N/A 07/05/2017   Procedure: ENTEROSCOPY;  Surgeon: [name], MD;  Location: [facility] ENDOSCOPY;  Service: Gastroenterology;  Laterality: N/A;   ENTEROSCOPY N/A 06/21/2018   Procedure: ENTEROSCOPY;  Surgeon: [name], MD;  Location: [facility] ENDOSCOPY;  Service: Gastroenterology;  Laterality: N/A;   ESOPHAGOGASTRODUODENOSCOPY (EGD) WITH PROPOFOL N/A 08/12/2015   Procedure: ESOPHAGOGASTRODUODENOSCOPY (EGD) WITH PROPOFOL;  Surgeon: Lollie Sails, MD;  Location: [facility] ENDOSCOPY;  Service: Endoscopy;  Laterality: N/A;   ESOPHAGOGASTRODUODENOSCOPY (EGD) WITH PROPOFOL N/A 04/03/2016   Procedure: ESOPHAGOGASTRODUODENOSCOPY (EGD) WITH PROPOFOL;  Surgeon: Lucilla Lame, MD;  Location: [facility] ENDOSCOPY;  Service: Endoscopy;  Laterality: N/A;   GIVENS CAPSULE STUDY N/A 04/28/2016   Procedure: GIVENS CAPSULE STUDY;  Surgeon: [name], MD;  Location: [facility] ENDOSCOPY;  Service: Endoscopy;  Laterality: N/A;   GIVENS CAPSULE STUDY N/A 06/21/2017   Procedure: GIVENS CAPSULE STUDY;  Surgeon: [name], MD;  Location: [facility] ENDOSCOPY;  Service: Gastroenterology;  Laterality: N/A;   JOINT REPLACEMENT      reports that she quit smoking about 42 years ago. Her smoking use included cigarettes. She has a 40.00 pack-year smoking history. She has never used smokeless tobacco. She reports that she does not currently use alcohol after a past usage of about 1.0 standard drink of alcohol per week. She reports that she does not use drugs. Social History   Socioeconomic History   Marital status: Widowed    Spouse name: Not on file   Number of children: Not on file   Years of education: Not on file   Highest  education level: Not on file  Occupational History   Not on file  Tobacco Use   Smoking status: Former    Packs/day: 2.00    Years: 20.00    Total pack years: 40.00    Types: Cigarettes    Quit date: 04/22/1979    Years since quitting: 42.6   Smokeless tobacco: Never  Vaping Use   Vaping Use: Never used  Substance and Sexual Activity   Alcohol use: Not Currently    Alcohol/week: 1.0 standard drink of alcohol    Types: 1 Glasses of wine per week   Drug use: No   Sexual activity: Not Currently    Birth control/protection: Post-menopausal  Other Topics Concern   Not on file  Social History Narrative   Not on file   Social Determinants of Health   Financial Resource Strain: Not on file  Food Insecurity: No Food Insecurity (11/24/2021)  Hunger Vital Sign    Worried About Running Out of Food in the Last Year: Never true    Ran Out of Food in the Last Year: Never true  Transportation Needs: No Transportation Needs (11/24/2021)   PRAPARE - Hydrologist (Medical): No    Lack of Transportation (Non-Medical): No  Physical Activity: Not on file  Stress: Not on file  Social Connections: Not on file  Intimate Partner Violence: Not At Risk (11/24/2021)   Humiliation, Afraid, Rape, and Kick questionnaire    Fear of Current or Ex-Partner: No    Emotionally Abused: No    Physically Abused: No    Sexually Abused: No    Functional Status Survey:    Family History  Problem Relation Age of Onset   CAD Mother    CAD Father    CAD Brother     Health Maintenance  Topic Date Due   Zoster Vaccines- Shingrix (1 of 2) Never done   DEXA SCAN  Never done   COVID-19 Vaccine (3 - Pfizer risk series) 03/31/2019   INFLUENZA VACCINE  08/09/2021   Pneumonia Vaccine 110+ Years old  Completed   HPV VACCINES  Aged Out    Allergies  Allergen Reactions   Nsaids Hives   Dextrans Hives   Fentanyl Itching   Lipitor [Atorvastatin] Other (See Comments)     Reaction: Muscle pain   Lovastatin    Mobic [Meloxicam] Other (See Comments)    Reaction: Mouth and tongue ulcers   Niacin And Related Itching   Other    Polysaccharide K Hives   Pravachol [Pravastatin Sodium] Other (See Comments)    Reaction: Muscle pain   Statins Other (See Comments)   Tolmetin Hives   Voltaren [Diclofenac Sodium] Other (See Comments)    Reaction: Mouth and tongue ulcers   Zetia [Ezetimibe] Other (See Comments)    Reaction: Leg pain   Codeine Nausea And Vomiting   Niacin Itching    Outpatient Encounter Medications as of 11/30/2021  Medication Sig   acetaminophen (TYLENOL) 500 MG tablet Take 500-1,000 mg by mouth every 6 (six) hours as needed for mild pain, moderate pain or fever.   acidophilus (RISAQUAD) CAPS capsule Take 1 capsule by mouth daily.   bisacodyl (DULCOLAX) 5 MG EC tablet Take 1 tablet (5 mg total) by mouth daily as needed for moderate constipation.   cefdinir (OMNICEF) 300 MG capsule Take 1 capsule (300 mg total) by mouth 2 (two) times daily for 5 days.   Cyanocobalamin (B-12) 1000 MCG CAPS Take 2,000 Units by mouth daily.    escitalopram (LEXAPRO) 20 MG tablet Take 20 mg by mouth daily.   fluticasone (FLONASE) 50 MCG/ACT nasal spray Place into both nostrils.   gabapentin (NEURONTIN) 300 MG capsule Take 1 capsule (300 mg total) by mouth 3 (three) times daily.   latanoprost (XALATAN) 0.005 % ophthalmic solution Place 1 drop into both eyes at bedtime.   lidocaine (XYLOCAINE) 2 % solution Use as directed 15 mLs in the mouth or throat as needed for mouth pain.   lidocaine-prilocaine (EMLA) cream Apply topically every 4 (four) hours as needed (skin pain).   losartan (COZAAR) 50 MG tablet Take 50 mg by mouth daily.   mometasone (ELOCON) 0.1 % lotion Apply 1 application topically daily as needed (skin irritation).   Multiple Vitamins-Minerals (OCUVITE PRESERVISION PO) Take 1 tablet by mouth 2 (two) times daily.   oxyCODONE (OXY IR/ROXICODONE) 5 MG  immediate release tablet Take  1 tablet (5 mg total) by mouth every 4 (four) hours as needed for up to 14 days for breakthrough pain.   OXYCONTIN 15 MG 12 hr tablet Take 1 tablet (15 mg total) by mouth every 12 (twelve) hours for 5 days.   potassium chloride (KLOR-CON) 10 MEQ tablet Take 10 mEq by mouth 3 (three) times daily.   RABEprazole (ACIPHEX) 20 MG tablet Take 20 mg by mouth 2 (two) times daily.   silver sulfADIAZINE (SILVADENE) 1 % cream Apply topically 2 (two) times daily. To skin wounds on feet   timolol (TIMOPTIC) 0.5 % ophthalmic solution Place 1 drop into both eyes daily.   valACYclovir (VALTREX) 500 MG tablet Take 1 tablet (500 mg total) by mouth 2 (two) times daily for 2 days.   [DISCONTINUED] cephALEXin (KEFLEX) 500 MG capsule Take 500 mg by mouth 3 (three) times daily.   [DISCONTINUED] fluconazole (DIFLUCAN) 150 MG tablet Take 150 mg by mouth once.   [DISCONTINUED] hydrochlorothiazide (HYDRODIURIL) 12.5 MG tablet Take 12.5 mg by mouth daily.   [DISCONTINUED] ipratropium-albuterol (DUONEB) 0.5-2.5 (3) MG/3ML SOLN Inhale 3 mLs into the lungs 2 (two) times daily.   [DISCONTINUED] ondansetron (ZOFRAN-ODT) 4 MG disintegrating tablet Take 4 mg by mouth every 6 (six) hours as needed.   [DISCONTINUED] oxyCODONE (OXY IR/ROXICODONE) 5 MG immediate release tablet Take 1 tablet (5 mg total) by mouth every 4 (four) hours as needed for up to 5 days for breakthrough pain.   [DISCONTINUED] oxyCODONE (OXY IR/ROXICODONE) 5 MG immediate release tablet Take 1 tablet (5 mg total) by mouth every 4 (four) hours as needed for up to 14 days for breakthrough pain.   [DISCONTINUED] OXYCONTIN 15 MG 12 hr tablet Take 1 tablet (15 mg total) by mouth every 12 (twelve) hours for 5 days.   [DISCONTINUED] OXYCONTIN 15 MG 12 hr tablet Take 1 tablet (15 mg total) by mouth every 12 (twelve) hours for 5 days.   Facility-Administered Encounter Medications as of 11/30/2021  Medication   0.9 %  sodium chloride infusion    [DISCONTINUED] acetaminophen (TYLENOL) suppository 650 mg   [DISCONTINUED] acetaminophen (TYLENOL) tablet 650 mg   [DISCONTINUED] acidophilus (RISAQUAD) capsule 1 capsule   [DISCONTINUED] bisacodyl (DULCOLAX) EC tablet 5 mg   [DISCONTINUED] ceFEPIme (MAXIPIME) 2 g in sodium chloride 0.9 % 100 mL IVPB   [DISCONTINUED] cyanocobalamin (VITAMIN B12) tablet 2,000 mcg   [DISCONTINUED] enoxaparin (LOVENOX) injection 40 mg   [DISCONTINUED] escitalopram (LEXAPRO) tablet 20 mg   [DISCONTINUED] fluticasone (FLONASE) 50 MCG/ACT nasal spray 1 spray   [DISCONTINUED] gabapentin (NEURONTIN) capsule 400 mg   [DISCONTINUED] hydrALAZINE (APRESOLINE) injection 5 mg   [DISCONTINUED] latanoprost (XALATAN) 0.005 % ophthalmic solution 1 drop   [DISCONTINUED] lidocaine (XYLOCAINE) 2 % viscous mouth solution 15 mL   [DISCONTINUED] lidocaine-prilocaine (EMLA) cream   [DISCONTINUED] losartan (COZAAR) tablet 50 mg   [DISCONTINUED] mometasone (ELOCON) 0.1 % cream 1 Application   [DISCONTINUED] morphine (PF) 2 MG/ML injection 1 mg   [DISCONTINUED] ondansetron (ZOFRAN) injection 4 mg   [DISCONTINUED] ondansetron (ZOFRAN) tablet 4 mg   [DISCONTINUED] oxyCODONE (Oxy IR/ROXICODONE) immediate release tablet 5 mg   [DISCONTINUED] oxyCODONE (OXYCONTIN) 12 hr tablet 15 mg   [DISCONTINUED] pantoprazole (PROTONIX) EC tablet 40 mg   [DISCONTINUED] potassium chloride (KLOR-CON M) CR tablet 10 mEq   [DISCONTINUED] senna-docusate (Senokot-S) tablet 1 tablet   [DISCONTINUED] silver sulfADIAZINE (SILVADENE) 1 % cream   [DISCONTINUED] sodium chloride flush (NS) 0.9 % injection 3 mL   [DISCONTINUED] timolol (TIMOPTIC) 0.5 %  ophthalmic solution 1 drop   [DISCONTINUED] traZODone (DESYREL) tablet 25 mg   [DISCONTINUED] valACYclovir (VALTREX) tablet 500 mg    Review of Systems  Vitals:   11/30/21 0936  BP: (!) 173/74  Pulse: 90  Resp: 16  Temp: (!) 97 F (36.1 C)  SpO2: 93%  Weight: 148 lb (67.1 kg)  Height: '5\' 7"'$  (1.702 m)    Body mass index is 23.18 kg/m. Physical Exam Cardiovascular:     Rate and Rhythm: Normal rate.     Pulses: Normal pulses.  Pulmonary:     Effort: Pulmonary effort is normal.  Abdominal:     General: Abdomen is flat. Bowel sounds are normal.     Palpations: Abdomen is soft.  Skin:    Comments: Bilateral feet below. Abdomen and legs with diffuse macular rash.   Neurological:     General: No focal deficit present.     Mental Status: She is alert and oriented to person, place, and time.          Labs reviewed: Basic Metabolic Panel: Recent Labs    11/25/21 0632 11/26/21 0412 11/27/21 0556 11/29/21 0632  NA 132* 134*  --  135  K 3.7 4.1  --  3.9  CL 102 104  --  104  CO2 25 23  --  28  GLUCOSE 89 84  --  90  BUN 19 11  --  8  CREATININE 0.44 0.36* 0.41* 0.38*  CALCIUM 8.5* 8.7*  --  8.6*   Liver Function Tests: Recent Labs    06/22/21 1251 11/24/21 1633 11/25/21 0632  AST 20 19 14*  ALT '11 9 9  '$ ALKPHOS 53 61 51  BILITOT 0.4 0.6 0.7  PROT 7.3 7.2 5.9*  ALBUMIN 3.8 3.0* 2.4*   Recent Labs    05/19/21 1256  LIPASE 35   No results for input(s): "AMMONIA" in the last 8760 hours. CBC: Recent Labs    06/22/21 1251 09/22/21 1112 11/24/21 1633 11/25/21 0632 11/26/21 0412 11/29/21 0632  WBC 7.7 12.2* 16.2* 8.4 7.7 6.0  NEUTROABS 5.3 9.8* 13.2*  --   --   --   HGB 11.1* 11.7* 10.1* 8.3* 8.6* 9.0*  HCT 34.7* 35.4* 30.9* 25.1* 25.9* 27.2*  MCV 78.2* 83.5 80.5 79.7* 80.2 80.2  PLT 321 402* 448* 367 394 443*   Cardiac Enzymes: No results for input(s): "CKTOTAL", "CKMB", "CKMBINDEX", "TROPONINI" in the last 8760 hours. BNP: Invalid input(s): "POCBNP" No results found for: "HGBA1C" No results found for: "TSH" Lab Results  Component Value Date   VITAMINB12 3,407 (H) 07/25/2018   Lab Results  Component Value Date   FOLATE 30.0 10/11/2015   Lab Results  Component Value Date   IRON 37 09/22/2021   TIBC 270 09/22/2021   FERRITIN 117 09/22/2021     Imaging and Procedures obtained prior to SNF admission: DG Foot 2 Views Left  Result Date: 11/24/2021 CLINICAL DATA:  Cellulitis. Bilateral foot redness, swelling, and serous drainage starting 2 weeks ago. On oral Keflex for 2 days. Numerous abrasions, wounds, and skin breakdown within the bilateral feet. EXAM: LEFT FOOT - 2 VIEW COMPARISON:  None Available. FINDINGS: Moderate to high-grade hallux valgus. Moderate great toe metatarsophalangeal joint space narrowing with mild peripheral degenerative osteophytes. Mild-to-moderate interphalangeal joint space narrowing diffusely. Moderate medial first tarsometatarsal joint space narrowing. Moderate peripheral lateral anterior process of the calcaneus degenerative osteophytosis at the calcaneocuboid joint. Mild midfoot and forefoot soft tissue swelling. There are lucencies suspicious for edema and  possibly air within the lateral ankle soft tissues, best seen on frontal view. There is an ulceration of the posterior heel soft tissues apparently contacting the calcaneal heel spur at the Achilles tendon insertion. No definite cortical erosion. Small plantar calcaneal heel spur. No acute fracture or dislocation. IMPRESSION: 1. Posterior heel soft tissue ulceration coming close to a posterior calcaneal heel spur at the Achilles tendon insertion. No definite cortical erosion to suggest osteomyelitis. 2. There are low densities suspicious for edema and fluid within the lateral ankle soft tissues, best seen on frontal view. This is suspicious for cellulitis. 3. Moderate to high-grade hallux valgus. Moderate great toe metatarsophalangeal joint osteoarthritis. Electronically Signed   By: Yvonne Kendall M.D.   On: 11/24/2021 17:52   DG Foot 2 Views Right  Result Date: 11/24/2021 CLINICAL DATA:  Cellulitis. Bilateral foot redness, swelling, and serous drainage starting 2 weeks ago. On oral Keflex for 2 days. Numerous abrasions, wounds, and skin breakdown within the  bilateral feet. EXAM: RIGHT FOOT - 2 VIEW COMPARISON:  None Available. FINDINGS: Two screws are seen within the proximal half of the great toe metatarsal likely status post osteotomy. Mild hallux valgus. Mild great toe metatarsophalangeal joint space narrowing. Moderate lateral great toe interphalangeal joint space narrowing. Osseous fusion of the third PIP joint. Moderate joint space narrowing of the rest of the second through fifth interphalangeal joints. Small plantar calcaneal heel spur. Mild dorsal midfoot degenerative osteophytes. Moderate midfoot and forefoot soft tissue swelling. No cortical erosion is seen. No subcutaneous emphysema. IMPRESSION: 1. Moderate midfoot and forefoot soft tissue swelling. No radiographic evidence of osteomyelitis. 2. Mild hallux valgus and great toe metatarsophalangeal joint osteoarthritis. Electronically Signed   By: Yvonne Kendall M.D.   On: 11/24/2021 17:47   DG Chest Port 1 View  Result Date: 11/24/2021 CLINICAL DATA:  Sepsis EXAM: PORTABLE CHEST 1 VIEW COMPARISON:  Radiograph 11/04/2019 FINDINGS: Unchanged cardiomediastinal silhouette. Faint right mid lung and lingular opacities. No pleural effusion or pneumothorax. No acute osseous abnormality. Thoracic spondylosis. IMPRESSION: Faint right mid lung and lingular opacities, possibly developing infection or atelectasis. PA and lateral chest radiograph would be useful, if able. Electronically Signed   By: Maurine Simmering M.D.   On: 11/24/2021 16:48    Assessment/Plan 1. Bilateral lower leg cellulitis 3. Macular rash Patient admitted for lower extremity wounds and concern for infection. She has significant pain in the back and bilateral feet from the infection. Continue Omnicef and doxycycline for 14 days. Some concern patient has autoimmune response to chemotherapy and ulcerations of feet are due to previous blisters more consistent with bullous pemphigoid given inclusion of mouth lesions.  Will plan to continue current  treatment plan and if no improvement after 5 days of treatment will refer to wound care or dermatology depending on status of the wound. Continue with daily wound dressing changes. Hibiclens to clean. Calcium alginate of right foot and silvadene on left foot. Cover with ABD and wrap with kerlix.   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 Cancer associated pain Patient is status post chemotherapy x3/4 treatments. Continues to have pain and ulcerations in the mouth and uses viscous lidocaine and maalox for pain controlled. Decreased PO intake. Encouraged protein intake given her wounds. Will continue pro source and ensure (room temperature. Continue OxyContin 15 mg BID and PRN oxycodone q4 hours.Continue gabapentin 300 mg TID. Continue valtrex 500 mg BID for 5 days.   5. Gastroesophageal reflux disease without esophagitis Symptoms controlled with rabeprazole 20  mg BID. Marland Kitchen   6. HTN (hypertension), benign Continue Cozaar 50 mg daily.   7. Recurrent major depressive disorder, in partial remission ([place]) Mood impacted by pain, hospitalization, effects of cancer treatment. Continue Lexapro 20 mg daily.     Family/ staff Communication: nursing and SIL  Labs/tests ordered: CBC and BMP

## 2021-11-29 NOTE — Discharge Summary (Signed)
Physician Discharge Summary   Patient: [name] [name] MRN: [id number]  DOB: 08/22/1925   Admit:     Date of Admission: 11/24/2021 Admitted from: ALF   Discharge: Date of discharge: 11/29/21 Disposition: Skilled nursing facility Condition at discharge: good  CODE STATUS: DNR     Discharge Physician: Emeterio Reeve, DO [facility]     PCP: Kirk Ruths, MD  Recommendations for Outpatient Follow-up:  Follow up with PCP Kirk Ruths, MD in 1-2 weeks: recheck wounds on feet and rashes on back.  Please obtain labs/tests: CBC, BMP in 1-2 weeks Please follow up on the following pending results: none PCP AND OTHER OUTPATIENT PROVIDERS: SEE BELOW FOR SPECIFIC DISCHARGE INSTRUCTIONS PRINTED FOR PATIENT IN ADDITION TO GENERIC AVS PATIENT INFO    Discharge Instructions     Call MD for:  severe uncontrolled pain   Complete by: As directed    Call MD for:  temperature >100.4   Complete by: As directed    Diet - low sodium heart healthy   Complete by: As directed    Discharge wound care:   Complete by: As directed    Back:  Wound care to back rash: cleanse with NS, pat dry., Cover with single layer of xeroform gauze, top with silicone foam dressing. Change daily.  Feet: silver sulfadene to open areas. Cover with nonstick telfa pads and wrap with gauze and/or ACE bandage for skin protection.   Increase activity slowly   Complete by: As directed          Discharge Diagnoses: Principal Problem:   Bilateral lower leg cellulitis Active Problems:   Hyponatremia   Iron deficiency anemia due to chronic blood loss   HTN (hypertension), benign   HLD (hyperlipidemia)   GERD (gastroesophageal reflux disease)   Recurrent major depressive disorder, in partial remission (HCC)   Benign neoplasm of ascending colon   ARMD (age related macular degeneration)   Hereditary and idiopathic peripheral neuropathy   Squamous cell carcinoma of buccal mucosa  [facility])       Hospital Course: [name] [name] is a 88 y.o. 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  Patient was in a usual state of health until 2 weeks ago when she developed bilateral lower extremity redness swelling and wounds with serosanguineous drainage.  She was started on Keflex 2 days ago however the symptoms have progressively worsened  11/16: In ED (+)fever 101.4, WBC 16.2, Hgb 10.2. Sodium 130. Patient received cefepime, vancomycin, lactated Ringer's and Tylenol. BCx collected. XR feet bilaterally - no radiographic evidence for osteomyelitis. Medical admission has been requested for further management of sepsis secondary to bilateral lower extremity cellulitis.  11/17: pt reports burning/needle-like pain L lower back, rash concerning for possible shingles, started Valtrex. Continuing abx for cellulitis. WBC improved. VSS. No longer meets sepsis criteria 11/18: WBC and VS no concerns. Skin of feet appears same. Rash on back about same.  11/19: feet appear somewhat improved. D/c vanc. PT/OT for tomorrow (today is Sunday) anticipate may need SNF  11/20: PT/OT recommending SNF. Feet continue to improve. SKin exam same - punctate nonvesciular dermatomal lesion son L lower back / hip area but pt reports no pain - wil maintain valtrex for now.  11/21: pt reports improvement, able to ambulate some. Stable for discharge to complete po antibiotics  and follow outpatient.   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)   Sepsis secondary to bilateral lower extremity cellulitis - sepsis criteria have resolved  IV antibiotics: Cefepime --> Omnicef on d/c  IV fluid hydration --> weaned off as taking po  Follow up blood cultures --> BCx NGx2d  Follow CBC --> WBC improved   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) Immune compromise d/t chemo/radiation  Respiratory precautions / masking recommended  Rash concerning for Shingles likely precipitates by stress and immune compromise versus mild folliculitis on L lower back/hip Valtrex x7 days total  Pain improved, reduced gabapentin  Respiratory precautions  Pain control   History of hypertension Continue losartan   History of GERD Continue Protonix   History of COPD Continue Flonase, O2 as needed, nebs as needed   History of chronic pain Continue oxycodone, OxyContin, gabapentin Increased gabapentin d/t concern for shingles  Lidocaine to rash on back    History of depression Continue Lexapro  Code Status: DNR - see DNR form and MOST form              Discharge Instructions  Allergies as of 11/29/2021       Reactions   Nsaids Hives   Dextrans Hives   Fentanyl Itching   Lipitor [atorvastatin] Other (See Comments)   Reaction: Muscle pain   Lovastatin    Mobic [meloxicam] Other (See Comments)   Reaction: Mouth and tongue ulcers   Niacin And Related Itching   Other    Polysaccharide K Hives   Pravachol [pravastatin Sodium] Other (See Comments)   Reaction: Muscle pain   Statins Other (See Comments)   Tolmetin Hives   Voltaren [diclofenac Sodium] Other (See Comments)   Reaction: Mouth and tongue ulcers   Zetia [ezetimibe] Other (See Comments)   Reaction: Leg pain   Codeine  Nausea And Vomiting   Niacin Itching        Medication List     STOP taking these medications    cephALEXin 500 MG capsule Commonly known as: KEFLEX   fluconazole 150 MG tablet Commonly known as: DIFLUCAN   hydrochlorothiazide 12.5 MG tablet Commonly known as: HYDRODIURIL   ipratropium-albuterol 0.5-2.5 (3) MG/3ML Soln Commonly known as: DUONEB   ondansetron 4 MG disintegrating tablet Commonly known as: ZOFRAN-ODT       TAKE these medications    acetaminophen 500 MG tablet Commonly known as: TYLENOL Take 500-1,000 mg by mouth every 6 (six) hours as needed for mild pain, moderate pain or fever.   acidophilus Caps capsule Take 1 capsule by mouth daily. What changed: when to take this   B-12 1000 MCG Caps Take 2,000 Units by mouth daily.   bisacodyl 5 MG EC tablet Commonly known as: DULCOLAX Take 1 tablet (5 mg total) by mouth daily as needed for moderate constipation.   cefdinir 300 MG capsule Commonly known as: OMNICEF Take 1 capsule (300 mg total) by mouth 2 (two) times daily for 5 days.  escitalopram 20 MG tablet Commonly known as: LEXAPRO Take 20 mg by mouth daily.   fluticasone 50 MCG/ACT nasal spray Commonly known as: FLONASE Place into both nostrils.   gabapentin 300 MG capsule Commonly known as: NEURONTIN Take 1 capsule (300 mg total) by mouth 3 (three) times daily. What changed: when to take this   latanoprost 0.005 % ophthalmic solution Commonly known as: XALATAN Place 1 drop into both eyes at bedtime.   lidocaine 2 % solution Commonly known as: XYLOCAINE Use as directed 15 mLs in the mouth or throat as needed for mouth pain.   lidocaine-prilocaine cream Commonly known as: EMLA Apply topically every 4 (four) hours as needed (skin pain).   losartan 50 MG tablet Commonly known as: COZAAR Take 50 mg by mouth daily.   mometasone 0.1 % lotion Commonly known as: ELOCON Apply 1 application topically daily as needed (skin  irritation).   OCUVITE PRESERVISION PO Take 1 tablet by mouth 2 (two) times daily.   OxyCONTIN 15 mg 12 hr tablet Generic drug: oxyCODONE Take 1 tablet (15 mg total) by mouth every 12 (twelve) hours for 5 days. What changed: Another medication with the same name was changed. Make sure you understand how and when to take each.   oxyCODONE 5 MG immediate release tablet Commonly known as: Oxy IR/ROXICODONE Take 1 tablet (5 mg total) by mouth every 4 (four) hours as needed for up to 5 days for breakthrough pain. What changed: reasons to take this   potassium chloride 10 MEQ tablet Commonly known as: KLOR-CON Take 10 mEq by mouth 3 (three) times daily.   RABEprazole 20 MG tablet Commonly known as: ACIPHEX Take 20 mg by mouth 2 (two) times daily.   silver sulfADIAZINE 1 % cream Commonly known as: SILVADENE Apply topically 2 (two) times daily. To skin wounds on feet   timolol 0.5 % ophthalmic solution Commonly known as: TIMOPTIC Place 1 drop into both eyes daily.   valACYclovir 500 MG tablet Commonly known as: VALTREX Take 1 tablet (500 mg total) by mouth 2 (two) times daily for 2 days.               Discharge Care Instructions  (From admission, onward)           Start     Ordered   11/29/21 0000  Discharge wound care:       Comments: Back:  Wound care to back rash: cleanse with NS, pat dry., Cover with single layer of xeroform gauze, top with silicone foam dressing. Change daily.  Feet: silver sulfadene to open areas. Cover with nonstick telfa pads and wrap with gauze and/or ACE bandage for skin protection.   11/29/21 1436              Allergies  Allergen Reactions   Nsaids Hives   Dextrans Hives   Fentanyl Itching   Lipitor [Atorvastatin] Other (See Comments)    Reaction: Muscle pain   Lovastatin    Mobic [Meloxicam] Other (See Comments)    Reaction: Mouth and tongue ulcers   Niacin And Related Itching   Other    Polysaccharide K Hives    Pravachol [Pravastatin Sodium] Other (See Comments)    Reaction: Muscle pain   Statins Other (See Comments)   Tolmetin Hives   Voltaren [Diclofenac Sodium] Other (See Comments)    Reaction: Mouth and tongue ulcers   Zetia [Ezetimibe] Other (See Comments)    Reaction: Leg pain   Codeine Nausea And Vomiting  Niacin Itching     Subjective: pt reports pain is better, no concerns for CP/SOB, no skin pain today.    Discharge Exam: BP (!) 152/56 (BP Location: Left Arm)   Pulse 85   Temp 98 F (36.7 C)   Resp 16   Ht '5\' 7"'$  (1.702 m)   Wt 67.6 kg   SpO2 96%   BMI 23.34 kg/m  General: Pt is alert, awake, not in acute distress Cardiovascular: RRR, S1/S2 +, no rubs, no gallops Respiratory: CTA bilaterally, no wheezing, no rhonchi Abdominal: Soft, NT, ND, bowel sounds + Extremities: no edema, no cyanosis. No erythema.      The results of significant diagnostics from this hospitalization (including imaging, microbiology, ancillary and laboratory) are listed below for reference.     Microbiology: Recent Results (from the past 240 hour(s))  Culture, blood (Routine x 2)     Status: None   Collection Time: 11/24/21  4:33 PM   Specimen: BLOOD  Result Value Ref Range Status   Specimen Description BLOOD RIGHT ANTECUBITAL  Final   Special Requests   Final    BOTTLES DRAWN AEROBIC AND ANAEROBIC Blood Culture adequate volume   Culture   Final    NO GROWTH 5 DAYS Performed at [facility], [address], [place], [place] [address]    Report Status 11/29/2021 FINAL  Final  Culture, blood (Routine x 2)     Status: None   Collection Time: 11/24/21  4:33 PM   Specimen: BLOOD  Result Value Ref Range Status   Specimen Description BLOOD BLOOD RIGHT FOREARM  Final   Special Requests   Final    BOTTLES DRAWN AEROBIC AND ANAEROBIC Blood Culture adequate volume   Culture   Final    NO GROWTH 5 DAYS Performed at [facility], [address] [place], [place] [address]     Report Status 11/29/2021 FINAL  Final  Resp Panel by RT-PCR (Flu A&B, Covid) Anterior Nasal Swab     Status: None   Collection Time: 11/24/21  6:10 PM   Specimen: Anterior Nasal Swab  Result Value Ref Range Status   SARS Coronavirus 2 by RT PCR NEGATIVE NEGATIVE Final    Comment: (NOTE) SARS-CoV-2 target nucleic acids are NOT DETECTED.  The SARS-CoV-2 RNA is generally detectable in upper respiratory specimens during the acute phase of infection. The lowest concentration of SARS-CoV-2 viral copies this assay can detect is 138 copies/mL. A negative result does not preclude SARS-Cov-2 infection and should not be used as the sole basis for treatment or other patient management decisions. A negative result may occur with  improper specimen collection/handling, submission of specimen other than nasopharyngeal swab, presence of viral mutation(s) within the areas targeted by this assay, and inadequate number of viral copies(<138 copies/mL). A negative result must be combined with clinical observations, patient history, and epidemiological information. The expected result is Negative.  Fact Sheet for Patients:  EntrepreneurPulse.com.au  Fact Sheet for Healthcare Providers:  IncredibleEmployment.be  This test is no t yet approved or cleared by the [place] FDA and  has been authorized for detection and/or diagnosis of SARS-CoV-2 by FDA under an Emergency Use Authorization (EUA). This EUA will remain  in effect (meaning this test can be used) for the duration of the COVID-19 declaration under Section 564(b)(1) of the Act, 21 U.S.C.section 360bbb-3(b)(1), unless the authorization is terminated  or revoked sooner.       Influenza A by PCR NEGATIVE NEGATIVE Final  Influenza B by PCR NEGATIVE NEGATIVE Final    Comment: (NOTE) The Xpert Xpress SARS-CoV-2/FLU/RSV plus assay is intended as an aid in the diagnosis of influenza from Nasopharyngeal  swab specimens and should not be used as a sole basis for treatment. Nasal washings and aspirates are unacceptable for Xpert Xpress SARS-CoV-2/FLU/RSV testing.  Fact Sheet for Patients: EntrepreneurPulse.com.au  Fact Sheet for Healthcare Providers: IncredibleEmployment.be  This test is not yet approved or cleared by the [place] FDA and has been authorized for detection and/or diagnosis of SARS-CoV-2 by FDA under an Emergency Use Authorization (EUA). This EUA will remain in effect (meaning this test can be used) for the duration of the COVID-19 declaration under Section 564(b)(1) of the Act, 21 U.S.C. section 360bbb-3(b)(1), unless the authorization is terminated or revoked.  Performed at [facility], [address] [place], [place] [address]      Labs: BNP (last 3 results) No results for input(s): "BNP" in the last 8760 hours. Basic Metabolic Panel: Recent Labs  Lab 11/24/21 1633 11/25/21 0632 11/26/21 0412 11/27/21 0556 11/29/21 0632  NA 130* 132* 134*  --  135  K 4.5 3.7 4.1  --  3.9  CL 97* 102 104  --  104  CO2 '24 25 23  '$ --  28  GLUCOSE 107* 89 84  --  90  BUN 24* 19 11  --  8  CREATININE 0.58 0.44 0.36* 0.41* 0.38*  CALCIUM 8.9 8.5* 8.7*  --  8.6*   Liver Function Tests: Recent Labs  Lab 11/24/21 1633 11/25/21 0632  AST 19 14*  ALT 9 9  ALKPHOS 61 51  BILITOT 0.6 0.7  PROT 7.2 5.9*  ALBUMIN 3.0* 2.4*   No results for input(s): "LIPASE", "AMYLASE" in the last 168 hours. No results for input(s): "AMMONIA" in the last 168 hours. CBC: Recent Labs  Lab 11/24/21 1633 11/25/21 0632 11/26/21 0412 11/29/21 0632  WBC 16.2* 8.4 7.7 6.0  NEUTROABS 13.2*  --   --   --   HGB 10.1* 8.3* 8.6* 9.0*  HCT 30.9* 25.1* 25.9* 27.2*  MCV 80.5 79.7* 80.2 80.2  PLT 448* 367 394 443*   Cardiac Enzymes: No results for input(s): "CKTOTAL", "CKMB", "CKMBINDEX", "TROPONINI" in the last 168 hours. BNP: Invalid  input(s): "POCBNP" CBG: No results for input(s): "GLUCAP" in the last 168 hours. D-Dimer No results for input(s): "DDIMER" in the last 72 hours. Hgb A1c No results for input(s): "HGBA1C" in the last 72 hours. Lipid Profile No results for input(s): "CHOL", "HDL", "LDLCALC", "TRIG", "CHOLHDL", "LDLDIRECT" in the last 72 hours. Thyroid function studies No results for input(s): "TSH", "T4TOTAL", "T3FREE", "THYROIDAB" in the last 72 hours.  Invalid input(s): "FREET3" Anemia work up No results for input(s): "VITAMINB12", "FOLATE", "FERRITIN", "TIBC", "IRON", "RETICCTPCT" in the last 72 hours. Urinalysis    Component Value Date/Time   COLORURINE YELLOW (A) 11/27/2021 0405   APPEARANCEUR HAZY (A) 11/27/2021 0405   LABSPEC 1.012 11/27/2021 0405   PHURINE 6.0 11/27/2021 0405   GLUCOSEU NEGATIVE 11/27/2021 0405   HGBUR NEGATIVE 11/27/2021 0405   BILIRUBINUR NEGATIVE 11/27/2021 0405   KETONESUR NEGATIVE 11/27/2021 0405   PROTEINUR NEGATIVE 11/27/2021 0405   NITRITE NEGATIVE 11/27/2021 0405   LEUKOCYTESUR NEGATIVE 11/27/2021 0405   Sepsis Labs Recent Labs  Lab 11/24/21 1633 11/25/21 0632 11/26/21 0412 11/29/21 0632  WBC 16.2* 8.4 7.7 6.0   Microbiology Recent Results (from the past 240 hour(s))  Culture, blood (Routine x 2)     Status:  None   Collection Time: 11/24/21  4:33 PM   Specimen: BLOOD  Result Value Ref Range Status   Specimen Description BLOOD RIGHT ANTECUBITAL  Final   Special Requests   Final    BOTTLES DRAWN AEROBIC AND ANAEROBIC Blood Culture adequate volume   Culture   Final    NO GROWTH 5 DAYS Performed at [facility], [address], [place], [place] [address]    Report Status 11/29/2021 FINAL  Final  Culture, blood (Routine x 2)     Status: None   Collection Time: 11/24/21  4:33 PM   Specimen: BLOOD  Result Value Ref Range Status   Specimen Description BLOOD BLOOD RIGHT FOREARM  Final   Special Requests   Final    BOTTLES DRAWN AEROBIC AND  ANAEROBIC Blood Culture adequate volume   Culture   Final    NO GROWTH 5 DAYS Performed at [facility], [address] [place], [place] [address]    Report Status 11/29/2021 FINAL  Final  Resp Panel by RT-PCR (Flu A&B, Covid) Anterior Nasal Swab     Status: None   Collection Time: 11/24/21  6:10 PM   Specimen: Anterior Nasal Swab  Result Value Ref Range Status   SARS Coronavirus 2 by RT PCR NEGATIVE NEGATIVE Final    Comment: (NOTE) SARS-CoV-2 target nucleic acids are NOT DETECTED.  The SARS-CoV-2 RNA is generally detectable in upper respiratory specimens during the acute phase of infection. The lowest concentration of SARS-CoV-2 viral copies this assay can detect is 138 copies/mL. A negative result does not preclude SARS-Cov-2 infection and should not be used as the sole basis for treatment or other patient management decisions. A negative result may occur with  improper specimen collection/handling, submission of specimen other than nasopharyngeal swab, presence of viral mutation(s) within the areas targeted by this assay, and inadequate number of viral copies(<138 copies/mL). A negative result must be combined with clinical observations, patient history, and epidemiological information. The expected result is Negative.  Fact Sheet for Patients:  EntrepreneurPulse.com.au  Fact Sheet for Healthcare Providers:  IncredibleEmployment.be  This test is no t yet approved or cleared by the [place] FDA and  has been authorized for detection and/or diagnosis of SARS-CoV-2 by FDA under an Emergency Use Authorization (EUA). This EUA will remain  in effect (meaning this test can be used) for the duration of the COVID-19 declaration under Section 564(b)(1) of the Act, 21 U.S.C.section 360bbb-3(b)(1), unless the authorization is terminated  or revoked sooner.       Influenza A by PCR NEGATIVE NEGATIVE Final   Influenza B by PCR  NEGATIVE NEGATIVE Final    Comment: (NOTE) The Xpert Xpress SARS-CoV-2/FLU/RSV plus assay is intended as an aid in the diagnosis of influenza from Nasopharyngeal swab specimens and should not be used as a sole basis for treatment. Nasal washings and aspirates are unacceptable for Xpert Xpress SARS-CoV-2/FLU/RSV testing.  Fact Sheet for Patients: EntrepreneurPulse.com.au  Fact Sheet for Healthcare Providers: IncredibleEmployment.be  This test is not yet approved or cleared by the [place] FDA and has been authorized for detection and/or diagnosis of SARS-CoV-2 by FDA under an Emergency Use Authorization (EUA). This EUA will remain in effect (meaning this test can be used) for the duration of the COVID-19 declaration under Section 564(b)(1) of the Act, 21 U.S.C. section 360bbb-3(b)(1), unless the authorization is terminated or revoked.  Performed at [facility], [address], [place], [place] [address]    Imaging DG  Foot 2 Views Left  Result Date: 11/24/2021 CLINICAL DATA:  Cellulitis. Bilateral foot redness, swelling, and serous drainage starting 2 weeks ago. On oral Keflex for 2 days. Numerous abrasions, wounds, and skin breakdown within the bilateral feet. EXAM: LEFT FOOT - 2 VIEW COMPARISON:  None Available. FINDINGS: Moderate to high-grade hallux valgus. Moderate great toe metatarsophalangeal joint space narrowing with mild peripheral degenerative osteophytes. Mild-to-moderate interphalangeal joint space narrowing diffusely. Moderate medial first tarsometatarsal joint space narrowing. Moderate peripheral lateral anterior process of the calcaneus degenerative osteophytosis at the calcaneocuboid joint. Mild midfoot and forefoot soft tissue swelling. There are lucencies suspicious for edema and possibly air within the lateral ankle soft tissues, best seen on frontal view. There is an ulceration of the posterior heel soft tissues  apparently contacting the calcaneal heel spur at the Achilles tendon insertion. No definite cortical erosion. Small plantar calcaneal heel spur. No acute fracture or dislocation. IMPRESSION: 1. Posterior heel soft tissue ulceration coming close to a posterior calcaneal heel spur at the Achilles tendon insertion. No definite cortical erosion to suggest osteomyelitis. 2. There are low densities suspicious for edema and fluid within the lateral ankle soft tissues, best seen on frontal view. This is suspicious for cellulitis. 3. Moderate to high-grade hallux valgus. Moderate great toe metatarsophalangeal joint osteoarthritis. Electronically Signed   By: Yvonne Kendall M.D.   On: 11/24/2021 17:52   DG Foot 2 Views Right  Result Date: 11/24/2021 CLINICAL DATA:  Cellulitis. Bilateral foot redness, swelling, and serous drainage starting 2 weeks ago. On oral Keflex for 2 days. Numerous abrasions, wounds, and skin breakdown within the bilateral feet. EXAM: RIGHT FOOT - 2 VIEW COMPARISON:  None Available. FINDINGS: Two screws are seen within the proximal half of the great toe metatarsal likely status post osteotomy. Mild hallux valgus. Mild great toe metatarsophalangeal joint space narrowing. Moderate lateral great toe interphalangeal joint space narrowing. Osseous fusion of the third PIP joint. Moderate joint space narrowing of the rest of the second through fifth interphalangeal joints. Small plantar calcaneal heel spur. Mild dorsal midfoot degenerative osteophytes. Moderate midfoot and forefoot soft tissue swelling. No cortical erosion is seen. No subcutaneous emphysema. IMPRESSION: 1. Moderate midfoot and forefoot soft tissue swelling. No radiographic evidence of osteomyelitis. 2. Mild hallux valgus and great toe metatarsophalangeal joint osteoarthritis. Electronically Signed   By: Yvonne Kendall M.D.   On: 11/24/2021 17:47   DG Chest Port 1 View  Result Date: 11/24/2021 CLINICAL DATA:  Sepsis EXAM: PORTABLE CHEST  1 VIEW COMPARISON:  Radiograph 11/04/2019 FINDINGS: Unchanged cardiomediastinal silhouette. Faint right mid lung and lingular opacities. No pleural effusion or pneumothorax. No acute osseous abnormality. Thoracic spondylosis. IMPRESSION: Faint right mid lung and lingular opacities, possibly developing infection or atelectasis. PA and lateral chest radiograph would be useful, if able. Electronically Signed   By: Maurine Simmering M.D.   On: 11/24/2021 16:48      Time coordinating discharge: over 30 minutes  SIGNED:  Emeterio Reeve DO [facility]

## 2021-11-29 NOTE — Care Management Important Message (Signed)
Important Message  Patient Details  Name: [name] [name] MRN: [id number] Date of Birth: Jun 29, 1925   Medicare Important Message Given:  Yes     [name] 11/29/2021, 2:31 PM

## 2021-11-29 NOTE — Progress Notes (Signed)
Erroneous encounter-disregard

## 2021-11-29 NOTE — H&P (Incomplete)
Report called to Rehab facility. Nurse  was given opportunity to ask questions, and questions were answered appropriately. EMS picked up patient. Will cont to monitor.

## 2021-11-29 NOTE — Progress Notes (Signed)
PT Cancellation Note  Patient Details Name: [name] [name] MRN: [id number] DOB: 1925/05/14   Cancelled Treatment:    Reason Eval/Treat Not Completed: Fatigue/lethargy limiting ability to participate  Pt in bed.  Reports fatigue. Stated she did get up and walked around bed to recliner with nursing staff and was able to sit in chair for several hours.  She does request to be repositioned in bed and was assisted.  Pt comfortable in bed, sitter in attendance.   Chesley Noon 11/29/2021, 2:21 PM

## 2021-11-29 NOTE — Plan of Care (Signed)
  Problem: Health Behavior/Discharge Planning: Goal: Ability to manage health-related needs will improve Outcome: Progressing   Problem: Clinical Measurements: Goal: Ability to maintain clinical measurements within normal limits will improve Outcome: Progressing Goal: Diagnostic test results will improve Outcome: Progressing Goal: Respiratory complications will improve Outcome: Progressing Goal: Cardiovascular complication will be avoided Outcome: Progressing   Problem: Elimination: Goal: Will not experience complications related to bowel motility Outcome: Progressing Goal: Will not experience complications related to urinary retention Outcome: Progressing   Problem: Pain Managment: Goal: General experience of comfort will improve Outcome: Progressing   Problem: Safety: Goal: Ability to remain free from injury will improve Outcome: Progressing   Problem: Safety: Goal: Ability to remain free from injury will improve Outcome: Progressing

## 2021-11-30 ENCOUNTER — Other Ambulatory Visit: Payer: Medicare Other | Admitting: Student

## 2021-11-30 ENCOUNTER — Encounter: Payer: Self-pay | Admitting: Student

## 2021-11-30 ENCOUNTER — Non-Acute Institutional Stay (SKILLED_NURSING_FACILITY): Payer: Medicare Other | Admitting: Student

## 2021-11-30 DIAGNOSIS — R21 Rash and other nonspecific skin eruption: Secondary | ICD-10-CM

## 2021-11-30 DIAGNOSIS — L03116 Cellulitis of left lower limb: Secondary | ICD-10-CM

## 2021-11-30 DIAGNOSIS — L03115 Cellulitis of right lower limb: Secondary | ICD-10-CM

## 2021-11-30 DIAGNOSIS — C060 Malignant neoplasm of cheek mucosa: Secondary | ICD-10-CM | POA: Diagnosis not present

## 2021-11-30 DIAGNOSIS — K219 Gastro-esophageal reflux disease without esophagitis: Secondary | ICD-10-CM

## 2021-11-30 DIAGNOSIS — G893 Neoplasm related pain (acute) (chronic): Secondary | ICD-10-CM

## 2021-11-30 DIAGNOSIS — I10 Essential (primary) hypertension: Secondary | ICD-10-CM

## 2021-11-30 DIAGNOSIS — F3341 Major depressive disorder, recurrent, in partial remission: Secondary | ICD-10-CM

## 2021-11-30 MED ORDER — OXYCODONE HCL 5 MG PO TABS: 5.0000 mg | ORAL_TABLET | ORAL | 0 refills | Status: DC | PRN

## 2021-11-30 MED ORDER — OXYCONTIN 15 MG PO T12A: 15.0000 mg | EXTENDED_RELEASE_TABLET | Freq: Two times a day (BID) | ORAL | 0 refills | Status: DC

## 2021-11-30 NOTE — Telephone Encounter (Signed)
Patient was discharged yesterday. She recd 1 dose of IV iron inpatient. She can receive 2 more doses starting 12/13 when she comes to see me

## 2021-12-06 ENCOUNTER — Encounter: Payer: Self-pay | Admitting: Nurse Practitioner

## 2021-12-06 ENCOUNTER — Encounter (INDEPENDENT_AMBULATORY_CARE_PROVIDER_SITE_OTHER): Payer: Medicare Other | Admitting: Vascular Surgery

## 2021-12-06 ENCOUNTER — Encounter (INDEPENDENT_AMBULATORY_CARE_PROVIDER_SITE_OTHER): Payer: Medicare Other

## 2021-12-06 ENCOUNTER — Non-Acute Institutional Stay (SKILLED_NURSING_FACILITY): Payer: Medicare Other | Admitting: Nurse Practitioner

## 2021-12-06 DIAGNOSIS — L299 Pruritus, unspecified: Secondary | ICD-10-CM | POA: Diagnosis not present

## 2021-12-06 DIAGNOSIS — F3340 Major depressive disorder, recurrent, in remission, unspecified: Secondary | ICD-10-CM | POA: Diagnosis not present

## 2021-12-06 NOTE — Progress Notes (Unsigned)
Location:  Other [facility]) Nursing Home Room Number: 101-A Place of Service:  SNF [phone]) Provider:  Carlos American. Dewaine Oats, NP   Patient Care Team: Dewayne Shorter, MD as PCP - General (Family Medicine) Sindy Guadeloupe, MD as Consulting Physician (Hematology and Oncology)  Extended Emergency Contact Information Primary Emergency Contact: [place] Phone: [phone] Mobile Phone: [phone] Relation: Son Secondary Emergency Contact: Cobos,[name] Address: [address]          [place], [place] [address] Johnnette Litter of [place] Phone: [phone] Mobile Phone: [phone] Relation: Son  Code Status:  DNR Goals of care: Advanced Directive information    12/06/2021    2:18 PM  Advanced Directives  Does Patient Have a Medical Advance Directive? Yes  Type of Paramedic of [place];Out of facility DNR (pink MOST or yellow form);Living will  Does patient want to make changes to medical advance directive? No - Patient declined  Copy of [place] in Chart? Yes - validated most recent copy scanned in chart (See row information)     Chief Complaint  Patient presents with   Acute Visit    Itching. Vitals and medications are a reflection of [facility] system, [facility]      HPI:  Pt is a 88 y.o. female seen today for an acute visit for back itching and pain.  Pt is at [facility] for rehab after hospitalization for rash. 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 She had radiation and since has had sores in mouth, legs and area on back. No progression of rash on her back but itching and she has pain all over.   She is currently on oxy IR and ER for pain control. She wants to make sure staff stays on top of giving her pain medication.    Past Medical History:  Diagnosis Date   Actinic keratosis    Anemia    COPD (chronic obstructive pulmonary disease)  (HCC)    Diverticulitis    GERD (gastroesophageal reflux disease)    Glaucoma    HLD (hyperlipidemia)    Hypertension    Iron deficiency    Rectal bleeding    Skin cancer    Nose, txted by Dr. Valere Dross at [facility]   Past Surgical History:  Procedure Laterality Date   ABDOMINAL HYSTERECTOMY     APPENDECTOMY     COLONOSCOPY WITH PROPOFOL N/A 04/14/2016   Procedure: COLONOSCOPY WITH PROPOFOL;  Surgeon: Lucilla Lame, MD;  Location: [facility] ENDOSCOPY;  Service: Endoscopy;  Laterality: N/A;   ENTEROSCOPY N/A 05/10/2016   Procedure: Push ENTEROSCOPY with pediatric colonoscope;  Surgeon: [name], MD;  Location: [facility] ENDOSCOPY;  Service: Endoscopy;  Laterality: N/A;   ENTEROSCOPY N/A 05/30/2016   Procedure: push ENTEROSCOPY;  Surgeon: [name], MD;  Location: [facility] ENDOSCOPY;  Service: Endoscopy;  Laterality: N/A;   ENTEROSCOPY N/A 07/05/2017   Procedure: ENTEROSCOPY;  Surgeon: [name], MD;  Location: [facility] ENDOSCOPY;  Service: Gastroenterology;  Laterality: N/A;   ENTEROSCOPY N/A 06/21/2018   Procedure: ENTEROSCOPY;  Surgeon: [name], MD;  Location: [facility] ENDOSCOPY;  Service: Gastroenterology;  Laterality: N/A;   ESOPHAGOGASTRODUODENOSCOPY (EGD) WITH PROPOFOL N/A 08/12/2015   Procedure: ESOPHAGOGASTRODUODENOSCOPY (EGD) WITH PROPOFOL;  Surgeon: Lollie Sails, MD;  Location: [facility] ENDOSCOPY;  Service: Endoscopy;  Laterality: N/A;   ESOPHAGOGASTRODUODENOSCOPY (EGD) WITH PROPOFOL N/A 04/03/2016   Procedure: ESOPHAGOGASTRODUODENOSCOPY (EGD) WITH PROPOFOL;  Surgeon: Lucilla Lame, MD;  Location: [facility]  ENDOSCOPY;  Service: Endoscopy;  Laterality: N/A;   GIVENS CAPSULE STUDY N/A 04/28/2016   Procedure: GIVENS CAPSULE STUDY;  Surgeon: [name], MD;  Location: [facility] ENDOSCOPY;  Service: Endoscopy;  Laterality: N/A;   GIVENS CAPSULE STUDY N/A 06/21/2017   Procedure: GIVENS CAPSULE STUDY;  Surgeon: [name], MD;  Location: [facility] ENDOSCOPY;  Service: Gastroenterology;  Laterality: N/A;   JOINT REPLACEMENT       Allergies  Allergen Reactions   Nsaids Hives   Dextrans Hives   Fentanyl Itching   Lipitor [Atorvastatin] Other (See Comments)    Reaction: Muscle pain   Lovastatin    Mobic [Meloxicam] Other (See Comments)    Reaction: Mouth and tongue ulcers   Niacin And Related Itching   Other    Polysaccharide K Hives   Pravachol [Pravastatin Sodium] Other (See Comments)    Reaction: Muscle pain   Statins Other (See Comments)   Tolmetin Hives   Voltaren [Diclofenac Sodium] Other (See Comments)    Reaction: Mouth and tongue ulcers   Zetia [Ezetimibe] Other (See Comments)    Reaction: Leg pain   Codeine Nausea And Vomiting   Niacin Itching    Outpatient Encounter Medications as of 12/06/2021  Medication Sig   acetaminophen (TYLENOL) 500 MG tablet Take 500-1,000 mg by mouth every 6 (six) hours as needed for mild pain, moderate pain or fever.   aluminum-magnesium hydroxide 200-200 MG/5ML suspension Take 30 mLs by mouth every 4 (four) hours as needed for indigestion. Mix with Lidocaine 2 %   bisacodyl (DULCOLAX) 5 MG EC tablet Take 1 tablet (5 mg total) by mouth daily as needed for moderate constipation.   cefdinir (OMNICEF) 300 MG capsule Take 300 mg by mouth 2 (two) times daily. For cellulitis   Cyanocobalamin (B-12) 1000 MCG CAPS Take 2,000 Units by mouth daily.    doxycycline (ADOXA) 100 MG tablet Take 100 mg by mouth 2 (two) times daily.   escitalopram (LEXAPRO) 20 MG tablet Take 20 mg by mouth daily.   fluticasone (FLONASE) 50 MCG/ACT nasal spray Place into both nostrils.   gabapentin (NEURONTIN) 300 MG capsule Take 1 capsule (300 mg total) by mouth 3 (three) times daily.   latanoprost (XALATAN) 0.005 % ophthalmic solution Place 1 drop into both eyes at bedtime.   lidocaine (XYLOCAINE) 2 % solution Use as directed in the mouth or throat. Mix with Maalox 30 mL and allow patient to apply to affected areas in mouth every 4 hours as needed for mouth ulcers   lidocaine-prilocaine (EMLA)  cream Apply topically every 4 (four) hours as needed (skin pain).   losartan (COZAAR) 50 MG tablet Take 50 mg by mouth daily.   mometasone (ELOCON) 0.1 % lotion Apply 1 application topically daily as needed (skin irritation).   Multiple Vitamins-Minerals (PRESERVISION AREDS 2) CAPS Take 1 capsule by mouth 2 (two) times daily.   oxyCODONE (OXY IR/ROXICODONE) 5 MG immediate release tablet Take 1 tablet (5 mg total) by mouth every 4 (four) hours as needed for breakthrough pain.   OXYCONTIN 15 MG 12 hr tablet Take 1 tablet (15 mg total) by mouth every 12 (twelve) hours.   RABEprazole (ACIPHEX) 20 MG tablet Take 20 mg by mouth 2 (two) times daily.   Saccharomyces boulardii (PROBIOTIC) 250 MG CAPS Take 1 capsule by mouth 2 (two) times daily.   silver sulfADIAZINE (SILVADENE) 1 % cream Apply topically 2 (two) times daily. To skin wounds on feet   timolol (TIMOPTIC) 0.5 % ophthalmic solution Place 1  drop into both eyes daily.   [DISCONTINUED] acidophilus (RISAQUAD) CAPS capsule Take 1 capsule by mouth daily.   [DISCONTINUED] Multiple Vitamins-Minerals (OCUVITE PRESERVISION PO) Take 1 tablet by mouth 2 (two) times daily.   [DISCONTINUED] potassium chloride (KLOR-CON) 10 MEQ tablet Take 10 mEq by mouth 3 (three) times daily.   Facility-Administered Encounter Medications as of 12/06/2021  Medication   0.9 %  sodium chloride infusion    Review of Systems  Constitutional:  Negative for activity change, appetite change, fatigue and fever.  Eyes: Negative.   Respiratory:  Negative for cough and shortness of breath.   Cardiovascular:  Negative for chest pain, palpitations and leg swelling.  Gastrointestinal:  Negative for abdominal pain, constipation and diarrhea.  Genitourinary:  Negative for difficulty urinating and dysuria.  Musculoskeletal:  Positive for back pain and myalgias. Negative for arthralgias.  Skin:  Positive for rash and wound. Negative for color change.  Neurological:  Negative for  weakness.  Psychiatric/Behavioral:  Negative for agitation, behavioral problems and confusion.     Immunization History  Administered Date(s) Administered   Fluad Quad(high Dose 65+) 11/07/2019   Influenza, High Dose Seasonal PF 10/19/2015, 11/06/2016   Influenza,inj,Quad PF,6+ Mos 11/12/2013, 03/04/2015   Influenza-Unspecified 09/22/2009, 11/25/2010, 10/10/2011   PFIZER Comirnaty(Gray Top)Covid-19 Tri-Sucrose Vaccine 02/10/2019, 03/03/2019   Pneumococcal Conjugate-13 03/04/2015, 09/19/2018   Pneumococcal Polysaccharide-23 09/30/2001   Td 01/27/2009   Pertinent  Health Maintenance Due  Topic Date Due   DEXA SCAN  Never done   INFLUENZA VACCINE  08/09/2021      11/26/2021    9:18 PM 11/27/2021   11:00 AM 11/27/2021    8:26 PM 11/28/2021   10:00 AM 11/28/2021   10:00 PM  Fall Risk  Patient Fall Risk Level High fall risk High fall risk High fall risk High fall risk High fall risk   Functional Status Survey:    Vitals:   12/06/21 1401  BP: (!) 193/70  Pulse: 78  Resp: 18  Temp: (!) 95.4 F (35.2 C)  SpO2: 97%  Weight: 149 lb (67.6 kg)  Height: '5\' 7"'$  (1.702 m)   Body mass index is 23.34 kg/m. Physical Exam Constitutional:      Appearance: Normal appearance.  Cardiovascular:     Rate and Rhythm: Normal rate and regular rhythm.  Pulmonary:     Effort: Pulmonary effort is normal.  Skin:    General: Skin is warm and dry.     Findings: Rash present.     Comments: Legs are wrapped, scattered rash to back and abdomen   Neurological:     Mental Status: She is alert. Mental status is at baseline.  Psychiatric:        Mood and Affect: Mood normal.     Labs reviewed: Recent Labs    11/25/21 0632 11/26/21 0412 11/27/21 0556 11/29/21 0632  NA 132* 134*  --  135  K 3.7 4.1  --  3.9  CL 102 104  --  104  CO2 25 23  --  28  GLUCOSE 89 84  --  90  BUN 19 11  --  8  CREATININE 0.44 0.36* 0.41* 0.38*  CALCIUM 8.5* 8.7*  --  8.6*   Recent Labs    06/22/21 1251  11/24/21 1633 11/25/21 0632  AST 20 19 14*  ALT '11 9 9  '$ ALKPHOS 53 61 51  BILITOT 0.4 0.6 0.7  PROT 7.3 7.2 5.9*  ALBUMIN 3.8 3.0* 2.4*   Recent Labs  06/22/21 1251 09/22/21 1112 11/24/21 1633 11/25/21 0632 11/26/21 0412 11/29/21 0632  WBC 7.7 12.2* 16.2* 8.4 7.7 6.0  NEUTROABS 5.3 9.8* 13.2*  --   --   --   HGB 11.1* 11.7* 10.1* 8.3* 8.6* 9.0*  HCT 34.7* 35.4* 30.9* 25.1* 25.9* 27.2*  MCV 78.2* 83.5 80.5 79.7* 80.2 80.2  PLT 321 402* 448* 367 394 443*   No results found for: "TSH" No results found for: "HGBA1C" No results found for: "CHOL", "HDL", "LDLCALC", "LDLDIRECT", "TRIG", "CHOLHDL"  Significant Diagnostic Results in last 30 days:  DG Foot 2 Views Left  Result Date: 11/24/2021 CLINICAL DATA:  Cellulitis. Bilateral foot redness, swelling, and serous drainage starting 2 weeks ago. On oral Keflex for 2 days. Numerous abrasions, wounds, and skin breakdown within the bilateral feet. EXAM: LEFT FOOT - 2 VIEW COMPARISON:  None Available. FINDINGS: Moderate to high-grade hallux valgus. Moderate great toe metatarsophalangeal joint space narrowing with mild peripheral degenerative osteophytes. Mild-to-moderate interphalangeal joint space narrowing diffusely. Moderate medial first tarsometatarsal joint space narrowing. Moderate peripheral lateral anterior process of the calcaneus degenerative osteophytosis at the calcaneocuboid joint. Mild midfoot and forefoot soft tissue swelling. There are lucencies suspicious for edema and possibly air within the lateral ankle soft tissues, best seen on frontal view. There is an ulceration of the posterior heel soft tissues apparently contacting the calcaneal heel spur at the Achilles tendon insertion. No definite cortical erosion. Small plantar calcaneal heel spur. No acute fracture or dislocation. IMPRESSION: 1. Posterior heel soft tissue ulceration coming close to a posterior calcaneal heel spur at the Achilles tendon insertion. No definite  cortical erosion to suggest osteomyelitis. 2. There are low densities suspicious for edema and fluid within the lateral ankle soft tissues, best seen on frontal view. This is suspicious for cellulitis. 3. Moderate to high-grade hallux valgus. Moderate great toe metatarsophalangeal joint osteoarthritis. Electronically Signed   By: Yvonne Kendall M.D.   On: 11/24/2021 17:52   DG Foot 2 Views Right  Result Date: 11/24/2021 CLINICAL DATA:  Cellulitis. Bilateral foot redness, swelling, and serous drainage starting 2 weeks ago. On oral Keflex for 2 days. Numerous abrasions, wounds, and skin breakdown within the bilateral feet. EXAM: RIGHT FOOT - 2 VIEW COMPARISON:  None Available. FINDINGS: Two screws are seen within the proximal half of the great toe metatarsal likely status post osteotomy. Mild hallux valgus. Mild great toe metatarsophalangeal joint space narrowing. Moderate lateral great toe interphalangeal joint space narrowing. Osseous fusion of the third PIP joint. Moderate joint space narrowing of the rest of the second through fifth interphalangeal joints. Small plantar calcaneal heel spur. Mild dorsal midfoot degenerative osteophytes. Moderate midfoot and forefoot soft tissue swelling. No cortical erosion is seen. No subcutaneous emphysema. IMPRESSION: 1. Moderate midfoot and forefoot soft tissue swelling. No radiographic evidence of osteomyelitis. 2. Mild hallux valgus and great toe metatarsophalangeal joint osteoarthritis. Electronically Signed   By: Yvonne Kendall M.D.   On: 11/24/2021 17:47   DG Chest Port 1 View  Result Date: 11/24/2021 CLINICAL DATA:  Sepsis EXAM: PORTABLE CHEST 1 VIEW COMPARISON:  Radiograph 11/04/2019 FINDINGS: Unchanged cardiomediastinal silhouette. Faint right mid lung and lingular opacities. No pleural effusion or pneumothorax. No acute osseous abnormality. Thoracic spondylosis. IMPRESSION: Faint right mid lung and lingular opacities, possibly developing infection or  atelectasis. PA and lateral chest radiograph would be useful, if able. Electronically Signed   By: Maurine Simmering M.D.   On: 11/24/2021 16:48    Assessment/Plan 1. Pruritus -likely nerve related pruritis,  will change antidepressant to cymbalta to help with itch and pain. Nursing to monitor  - DULoxetine (CYMBALTA) 30 MG capsule; Take 1 capsule (30 mg total) by mouth daily.; Refill: 3  2. Recurrent major depressive disorder, in remission (HCC) - DULoxetine (CYMBALTA) 30 MG capsule; Take 1 capsule (30 mg total) by mouth daily.; Refill: 3  Khalia Gong K. [place], [facility] [phone]

## 2021-12-08 ENCOUNTER — Encounter: Payer: Self-pay | Admitting: Adult Health

## 2021-12-08 ENCOUNTER — Non-Acute Institutional Stay (SKILLED_NURSING_FACILITY): Payer: Medicare Other | Admitting: Adult Health

## 2021-12-08 DIAGNOSIS — L03115 Cellulitis of right lower limb: Secondary | ICD-10-CM

## 2021-12-08 DIAGNOSIS — G893 Neoplasm related pain (acute) (chronic): Secondary | ICD-10-CM | POA: Diagnosis not present

## 2021-12-08 DIAGNOSIS — L03116 Cellulitis of left lower limb: Secondary | ICD-10-CM | POA: Diagnosis not present

## 2021-12-08 MED ORDER — DULOXETINE HCL 30 MG PO CPEP: 30.0000 mg | ORAL_CAPSULE | Freq: Every day | ORAL | 3 refills | Status: DC

## 2021-12-08 MED ORDER — METHOCARBAMOL 500 MG PO TABS: 250.0000 mg | ORAL_TABLET | Freq: Two times a day (BID) | ORAL | 5 refills | Status: DC

## 2021-12-08 NOTE — Progress Notes (Signed)
Location:  Other [facility] Room Number: 101-A Place of Service:  SNF ([phone]) Provider:  Durenda Age, DNP, FNP-BC  Patient Care Team: Dewayne Shorter, MD as PCP - General (Family Medicine) Sindy Guadeloupe, MD as Consulting Physician (Hematology and Oncology)  Extended Emergency Contact Information Primary Emergency Contact: Lavina Hamman [place] of [place] Phone: [phone] Mobile Phone: [phone] Relation: Son Secondary Emergency Contact: Prioleau,[name] Address: [address]          [place], [place] [address] Johnnette Litter of [place] Phone: [phone] Mobile Phone: [phone] Relation: Son  Code Status:  DNR  Goals of care: Advanced Directive information    12/06/2021    2:18 PM  Advanced Directives  Does Patient Have a Medical Advance Directive? Yes  Type of Paramedic of [place];Out of facility DNR (pink MOST or yellow form);Living will  Does patient want to make changes to medical advance directive? No - Patient declined  Copy of [place] in Chart? Yes - validated most recent copy scanned in chart (See row information)     Chief Complaint  Patient presents with   Acute Visit    Pain Management    HPI:  Pt is a 88 y.o. female seen today for an acute visit for bilateral feet pain. 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  She was recently hospitalized 11/24/21 to 11/29/21 for bilateral leg cellulitis. She is currently on Cefdinir and Doxycycline and to complete X 14 days.   She takes Oxycodone PRN, Oxycontin ER, Cymbalta and Gabapentin for bilateral feet pain. She has wounds on bilateral feet and treatment done daily. She was seen in her room today. She stated that she experiences spasm and feels like"electrical shock". Current pain level is 4/10 but when she has  the spasms, pain level is 9/10.    Past Medical History:  Diagnosis Date   Actinic keratosis    Anemia    COPD (chronic obstructive pulmonary disease) (HCC)    Diverticulitis    GERD (gastroesophageal reflux disease)    Glaucoma    HLD (hyperlipidemia)    Hypertension    Iron deficiency    Rectal bleeding    Skin cancer    Nose, txted by Dr. Valere Dross at [facility]   Past Surgical History:  Procedure Laterality Date   ABDOMINAL HYSTERECTOMY     APPENDECTOMY     COLONOSCOPY WITH PROPOFOL N/A 04/14/2016   Procedure: COLONOSCOPY WITH PROPOFOL;  Surgeon: Lucilla Lame, MD;  Location: [facility] ENDOSCOPY;  Service: Endoscopy;  Laterality: N/A;   ENTEROSCOPY N/A 05/10/2016   Procedure: Push ENTEROSCOPY with pediatric colonoscope;  Surgeon: [name], MD;  Location: [facility] ENDOSCOPY;  Service: Endoscopy;  Laterality: N/A;   ENTEROSCOPY N/A 05/30/2016   Procedure: push ENTEROSCOPY;  Surgeon: [name], MD;  Location: [facility] ENDOSCOPY;  Service: Endoscopy;  Laterality: N/A;   ENTEROSCOPY N/A 07/05/2017   Procedure: ENTEROSCOPY;  Surgeon: [name], MD;  Location: [facility] ENDOSCOPY;  Service: Gastroenterology;  Laterality: N/A;   ENTEROSCOPY N/A 06/21/2018   Procedure: ENTEROSCOPY;  Surgeon: [name], MD;  Location: [facility] ENDOSCOPY;  Service: Gastroenterology;  Laterality: N/A;   ESOPHAGOGASTRODUODENOSCOPY (EGD) WITH PROPOFOL N/A 08/12/2015   Procedure: ESOPHAGOGASTRODUODENOSCOPY (EGD) WITH PROPOFOL;  Surgeon: Lollie Sails, MD;  Location: [facility] ENDOSCOPY;  Service: Endoscopy;  Laterality: N/A;   ESOPHAGOGASTRODUODENOSCOPY (EGD) WITH PROPOFOL N/A 04/03/2016   Procedure: ESOPHAGOGASTRODUODENOSCOPY (EGD) WITH PROPOFOL;  Surgeon: Lucilla Lame, MD;  Location: [facility] ENDOSCOPY;  Service: Endoscopy;  Laterality: N/A;   GIVENS CAPSULE STUDY N/A 04/28/2016   Procedure: GIVENS CAPSULE STUDY;  Surgeon: [name], MD;  Location: [facility] ENDOSCOPY;  Service: Endoscopy;  Laterality: N/A;   GIVENS CAPSULE STUDY N/A 06/21/2017    Procedure: GIVENS CAPSULE STUDY;  Surgeon: [name], MD;  Location: [facility] ENDOSCOPY;  Service: Gastroenterology;  Laterality: N/A;   JOINT REPLACEMENT      Allergies  Allergen Reactions   Nsaids Hives   Dextrans Hives   Fentanyl Itching   Lipitor [Atorvastatin] Other (See Comments)    Reaction: Muscle pain   Lovastatin    Mobic [Meloxicam] Other (See Comments)    Reaction: Mouth and tongue ulcers   Niacin And Related Itching   Other    Polysaccharide K Hives   Pravachol [Pravastatin Sodium] Other (See Comments)    Reaction: Muscle pain   Statins Other (See Comments)   Tolmetin Hives   Voltaren [Diclofenac Sodium] Other (See Comments)    Reaction: Mouth and tongue ulcers   Zetia [Ezetimibe] Other (See Comments)    Reaction: Leg pain   Codeine Nausea And Vomiting   Niacin Itching    Outpatient Encounter Medications as of 12/08/2021  Medication Sig   acetaminophen (TYLENOL) 500 MG tablet Take 500-1,000 mg by mouth every 6 (six) hours as needed for mild pain, moderate pain or fever.   aluminum-magnesium hydroxide 200-200 MG/5ML suspension Take 30 mLs by mouth every 4 (four) hours as needed for indigestion. Mix with Lidocaine 2 %   bisacodyl (DULCOLAX) 5 MG EC tablet Take 1 tablet (5 mg total) by mouth daily as needed for moderate constipation.   cefdinir (OMNICEF) 300 MG capsule Take 300 mg by mouth 2 (two) times daily. For cellulitis   Cyanocobalamin (B-12) 1000 MCG CAPS Take 2,000 Units by mouth daily.    doxycycline (ADOXA) 100 MG tablet Take 100 mg by mouth 2 (two) times daily.   DULoxetine (CYMBALTA) 30 MG capsule Take 1 capsule (30 mg total) by mouth daily.   fluticasone (FLONASE) 50 MCG/ACT nasal spray Place into both nostrils.   gabapentin (NEURONTIN) 300 MG capsule Take 1 capsule (300 mg total) by mouth 3 (three) times daily.   latanoprost (XALATAN) 0.005 % ophthalmic solution Place 1 drop into both eyes at bedtime.   lidocaine (XYLOCAINE) 2 % solution Use as directed  in the mouth or throat. Mix with Maalox 30 mL and allow patient to apply to affected areas in mouth every 4 hours as needed for mouth ulcers   lidocaine-prilocaine (EMLA) cream Apply topically every 4 (four) hours as needed (skin pain).   losartan (COZAAR) 50 MG tablet Take 50 mg by mouth daily.   mometasone (ELOCON) 0.1 % lotion Apply 1 application topically daily as needed (skin irritation).   Multiple Vitamins-Minerals (PRESERVISION AREDS 2) CAPS Take 1 capsule by mouth 2 (two) times daily.   oxyCODONE (OXY IR/ROXICODONE) 5 MG immediate release tablet Take 1 tablet (5 mg total) by mouth every 4 (four) hours as needed for breakthrough pain.   OXYCONTIN 15 MG 12 hr tablet Take 1 tablet (15 mg total) by mouth every 12 (twelve) hours.   RABEprazole (ACIPHEX) 20 MG tablet Take 20 mg by mouth 2 (two) times daily.   Saccharomyces boulardii (PROBIOTIC) 250 MG CAPS Take 1 capsule by mouth 2 (two) times daily.   silver sulfADIAZINE (SILVADENE) 1 % cream Apply topically 2 (two) times daily. To skin wounds on feet  timolol (TIMOPTIC) 0.5 % ophthalmic solution Place 1 drop into both eyes daily.   Facility-Administered Encounter Medications as of 12/08/2021  Medication   0.9 %  sodium chloride infusion    Review of Systems  Constitutional:  Negative for appetite change, chills, fatigue and fever.  HENT:  Negative for congestion, hearing loss, rhinorrhea and sore throat.   Eyes: Negative.   Respiratory:  Negative for cough, shortness of breath and wheezing.   Cardiovascular:  Negative for chest pain, palpitations and leg swelling.  Gastrointestinal:  Negative for abdominal pain, constipation, diarrhea, nausea and vomiting.  Genitourinary:  Negative for dysuria.  Musculoskeletal:  Positive for back pain. Negative for arthralgias and myalgias.  Skin:  Positive for wound. Negative for color change and rash.  Neurological:  Negative for dizziness, weakness and headaches.  Psychiatric/Behavioral:   Negative for behavioral problems. The patient is not nervous/anxious.        Immunization History  Administered Date(s) Administered   Fluad Quad(high Dose 65+) 11/07/2019   Influenza, High Dose Seasonal PF 10/19/2015, 11/06/2016   Influenza,inj,Quad PF,6+ Mos 11/12/2013, 03/04/2015   Influenza-Unspecified 09/22/2009, 11/25/2010, 10/10/2011   PFIZER Comirnaty(Gray Top)Covid-19 Tri-Sucrose Vaccine 02/10/2019, 03/03/2019   Pneumococcal Conjugate-13 03/04/2015, 09/19/2018   Pneumococcal Polysaccharide-23 09/30/2001   Td 01/27/2009   Pertinent  Health Maintenance Due  Topic Date Due   DEXA SCAN  Never done   INFLUENZA VACCINE  08/09/2021      11/26/2021    9:18 PM 11/27/2021   11:00 AM 11/27/2021    8:26 PM 11/28/2021   10:00 AM 11/28/2021   10:00 PM  Fall Risk  Patient Fall Risk Level High fall risk High fall risk High fall risk High fall risk High fall risk     Vitals:   12/08/21 1258  BP: (!) 149/80  Pulse: 89  Resp: 13  Temp: (!) 96.1 F (35.6 C)  Weight: 148 lb 12.8 oz (67.5 kg)  Height: '5\' 7"'$  (1.702 m)   Body mass index is 23.31 kg/m.  Physical Exam Constitutional:      Appearance: Normal appearance.  HENT:     Head: Normocephalic and atraumatic.     Nose: Nose normal.     Mouth/Throat:     Mouth: Mucous membranes are moist.  Eyes:     Conjunctiva/sclera: Conjunctivae normal.  Cardiovascular:     Rate and Rhythm: Normal rate and regular rhythm.  Pulmonary:     Effort: Pulmonary effort is normal.     Breath sounds: Normal breath sounds.  Abdominal:     General: Bowel sounds are normal.     Palpations: Abdomen is soft.  Musculoskeletal:        General: Normal range of motion.     Cervical back: Normal range of motion.  Skin:    General: Skin is warm and dry.     Comments: Bilateral feet wounds with dressing  Neurological:     General: No focal deficit present.     Mental Status: She is alert and oriented to person, place, and time.   Psychiatric:        Mood and Affect: Mood normal.        Behavior: Behavior normal.        Thought Content: Thought content normal.        Judgment: Judgment normal.       Labs reviewed: Recent Labs    11/25/21 0632 11/26/21 0412 11/27/21 0556 11/29/21 0632  NA 132* 134*  --  135  K  3.7 4.1  --  3.9  CL 102 104  --  104  CO2 25 23  --  28  GLUCOSE 89 84  --  90  BUN 19 11  --  8  CREATININE 0.44 0.36* 0.41* 0.38*  CALCIUM 8.5* 8.7*  --  8.6*   Recent Labs    06/22/21 1251 11/24/21 1633 11/25/21 0632  AST 20 19 14*  ALT '11 9 9  '$ ALKPHOS 53 61 51  BILITOT 0.4 0.6 0.7  PROT 7.3 7.2 5.9*  ALBUMIN 3.8 3.0* 2.4*   Recent Labs    06/22/21 1251 09/22/21 1112 11/24/21 1633 11/25/21 0632 11/26/21 0412 11/29/21 0632  WBC 7.7 12.2* 16.2* 8.4 7.7 6.0  NEUTROABS 5.3 9.8* 13.2*  --   --   --   HGB 11.1* 11.7* 10.1* 8.3* 8.6* 9.0*  HCT 34.7* 35.4* 30.9* 25.1* 25.9* 27.2*  MCV 78.2* 83.5 80.5 79.7* 80.2 80.2  PLT 321 402* 448* 367 394 443*   No results found for: "TSH" No results found for: "HGBA1C" No results found for: "CHOL", "HDL", "LDLCALC", "LDLDIRECT", "TRIG", "CHOLHDL"  Significant Diagnostic Results in last 30 days:  DG Foot 2 Views Left  Result Date: 11/24/2021 CLINICAL DATA:  Cellulitis. Bilateral foot redness, swelling, and serous drainage starting 2 weeks ago. On oral Keflex for 2 days. Numerous abrasions, wounds, and skin breakdown within the bilateral feet. EXAM: LEFT FOOT - 2 VIEW COMPARISON:  None Available. FINDINGS: Moderate to high-grade hallux valgus. Moderate great toe metatarsophalangeal joint space narrowing with mild peripheral degenerative osteophytes. Mild-to-moderate interphalangeal joint space narrowing diffusely. Moderate medial first tarsometatarsal joint space narrowing. Moderate peripheral lateral anterior process of the calcaneus degenerative osteophytosis at the calcaneocuboid joint. Mild midfoot and forefoot soft tissue swelling. There  are lucencies suspicious for edema and possibly air within the lateral ankle soft tissues, best seen on frontal view. There is an ulceration of the posterior heel soft tissues apparently contacting the calcaneal heel spur at the Achilles tendon insertion. No definite cortical erosion. Small plantar calcaneal heel spur. No acute fracture or dislocation. IMPRESSION: 1. Posterior heel soft tissue ulceration coming close to a posterior calcaneal heel spur at the Achilles tendon insertion. No definite cortical erosion to suggest osteomyelitis. 2. There are low densities suspicious for edema and fluid within the lateral ankle soft tissues, best seen on frontal view. This is suspicious for cellulitis. 3. Moderate to high-grade hallux valgus. Moderate great toe metatarsophalangeal joint osteoarthritis. Electronically Signed   By: Yvonne Kendall M.D.   On: 11/24/2021 17:52   DG Foot 2 Views Right  Result Date: 11/24/2021 CLINICAL DATA:  Cellulitis. Bilateral foot redness, swelling, and serous drainage starting 2 weeks ago. On oral Keflex for 2 days. Numerous abrasions, wounds, and skin breakdown within the bilateral feet. EXAM: RIGHT FOOT - 2 VIEW COMPARISON:  None Available. FINDINGS: Two screws are seen within the proximal half of the great toe metatarsal likely status post osteotomy. Mild hallux valgus. Mild great toe metatarsophalangeal joint space narrowing. Moderate lateral great toe interphalangeal joint space narrowing. Osseous fusion of the third PIP joint. Moderate joint space narrowing of the rest of the second through fifth interphalangeal joints. Small plantar calcaneal heel spur. Mild dorsal midfoot degenerative osteophytes. Moderate midfoot and forefoot soft tissue swelling. No cortical erosion is seen. No subcutaneous emphysema. IMPRESSION: 1. Moderate midfoot and forefoot soft tissue swelling. No radiographic evidence of osteomyelitis. 2. Mild hallux valgus and great toe metatarsophalangeal joint  osteoarthritis. Electronically Signed   By:  Yvonne Kendall M.D.   On: 11/24/2021 17:47   DG Chest Port 1 View  Result Date: 11/24/2021 CLINICAL DATA:  Sepsis EXAM: PORTABLE CHEST 1 VIEW COMPARISON:  Radiograph 11/04/2019 FINDINGS: Unchanged cardiomediastinal silhouette. Faint right mid lung and lingular opacities. No pleural effusion or pneumothorax. No acute osseous abnormality. Thoracic spondylosis. IMPRESSION: Faint right mid lung and lingular opacities, possibly developing infection or atelectasis. PA and lateral chest radiograph would be useful, if able. Electronically Signed   By: Maurine Simmering M.D.   On: 11/24/2021 16:48    Assessment/Plan  1. Chronic pain due to neoplasm -  will start on Robaxin for spasm -  continue Gabapentin, Cymbalta, Oxycontin ER and Oxycodone PRN - methocarbamol (ROBAXIN) 500 MG tablet; Take 0.5 tablets (250 mg total) by mouth 2 (two) times daily.  Dispense: 30 tablet; Refill: 5  2. Bilateral lower leg cellulitis -  continue Doxycycline -  continue wound treatment -  wound care consult  Family/ staff Communication: Discussed plan of care with resident and charge nurse  Labs/tests ordered:   None    Durenda Age, DNP, MSN, FNP-BC [facility] [phone] (Monday-Friday 8:00 a.m. - 5:00 p.m.) [phone] (after hours)

## 2021-12-13 ENCOUNTER — Non-Acute Institutional Stay: Payer: Medicare Other | Admitting: Hospice

## 2021-12-13 ENCOUNTER — Encounter: Payer: Medicare Other | Attending: Physician Assistant | Admitting: Physician Assistant

## 2021-12-13 DIAGNOSIS — L97812 Non-pressure chronic ulcer of other part of right lower leg with fat layer exposed: Secondary | ICD-10-CM | POA: Diagnosis not present

## 2021-12-13 DIAGNOSIS — L89893 Pressure ulcer of other site, stage 3: Secondary | ICD-10-CM | POA: Diagnosis not present

## 2021-12-13 DIAGNOSIS — J449 Chronic obstructive pulmonary disease, unspecified: Secondary | ICD-10-CM | POA: Insufficient documentation

## 2021-12-13 DIAGNOSIS — L97822 Non-pressure chronic ulcer of other part of left lower leg with fat layer exposed: Secondary | ICD-10-CM | POA: Diagnosis present

## 2021-12-13 DIAGNOSIS — D638 Anemia in other chronic diseases classified elsewhere: Secondary | ICD-10-CM | POA: Insufficient documentation

## 2021-12-13 DIAGNOSIS — Z515 Encounter for palliative care: Secondary | ICD-10-CM

## 2021-12-13 DIAGNOSIS — L03115 Cellulitis of right lower limb: Secondary | ICD-10-CM

## 2021-12-13 DIAGNOSIS — C069 Malignant neoplasm of mouth, unspecified: Secondary | ICD-10-CM | POA: Diagnosis not present

## 2021-12-13 DIAGNOSIS — I10 Essential (primary) hypertension: Secondary | ICD-10-CM | POA: Diagnosis not present

## 2021-12-13 DIAGNOSIS — C060 Malignant neoplasm of cheek mucosa: Secondary | ICD-10-CM

## 2021-12-13 DIAGNOSIS — Z923 Personal history of irradiation: Secondary | ICD-10-CM | POA: Diagnosis not present

## 2021-12-13 DIAGNOSIS — R52 Pain, unspecified: Secondary | ICD-10-CM

## 2021-12-13 NOTE — Progress Notes (Signed)
    [facility] Palliative Care Consult Note Telephone: [phone]  Fax: [phone]    Date of encounter: 12/13/21 12:05 PM PATIENT NAME: Ellorie Kindall [place] [id number]   [phone] (home)  DOB: [birth year] MRN: [id number] PRIMARY CARE PROVIDER:    Dewayne Shorter, MD,  [place] [address] [phone]  REFERRING PROVIDER:   Dewayne Shorter, MD [place],  [place] [address] [phone]  RESPONSIBLE PARTY:    Contact Information     Name Relation Home Work [place] Son [phone]  [phone]   S[place]out,[name] Son [phone]  [phone]   ratliff,charles Friend   [phone]   ratliff,lynn Denman George   [phone]        I met face to face with patient in the facility. Palliative Care was asked to follow this patient by consultation request of  Dewayne Shorter, MD to address advance care planning and complex medical decision making. This is a follow up visit.                                   ASSESSMENT AND/PLAN / RECOMMENDATIONS:  CODE STATUS: DNR   Symptom Management/Plan: SCC of buccal mucosa-receiving immunotherapy. Tolerating well.  Follow up with Oncologist as planned. Bil leg cellulitis: Continue Cefdinir, Doxycycline as ordered and to completion. Patient later went out to see wound doctor.  Pain: Managed with Oxycodone, Lidocaine patch, Gabapentin. Hypertension: Managed with Losartan.   Follow up Palliative Care Visit: Palliative care will continue to follow for complex medical decision making, advance care planning, and clarification of goals. Return in 6 weeks or prn.  PPS: 50%  HOSPICE ELIGIBILITY/DIAGNOSIS: TBD  Chief Complaint: Palliative Medicine follow up visit.   HISTORY OF PRESENT ILLNESS:  Marvena Tally is a 88 y.o. year old female  with  SCC of buccal mucosa, hx of radiation to head and neck 11/2020; anemia, hypertension,  COPD, GERD, bil lower leg cellulitis. Hx of GI bleed, peripheral neuropathy, Meniere's disease, arthritis, DDD, fibromyalgia, depression.   History obtained from review of EMR, discussion with primary team, and interview with family, facility staff/caregiver and/or Ms. Kulig.  I reviewed available labs, medications, imaging, studies and related documents from the EMR.  Records reviewed and summarized above.   I spent 35 minutes providing this consultation; this includes time spent with patient/family, chart review and documentation. More than 50% of the time in this consultation was spent on counseling and coordinating communication    Thank you for the opportunity to participate in the care of Jess Sulak Please call our office at [phone] if we can be of additional assistance.   Note: Portions of this note were generated with Lobby[employer]. Dictation errors may occur despite best attempts at proofreading.   Teodoro Spray, NP

## 2021-12-14 ENCOUNTER — Other Ambulatory Visit: Payer: Self-pay | Admitting: Student

## 2021-12-14 DIAGNOSIS — L03115 Cellulitis of right lower limb: Secondary | ICD-10-CM

## 2021-12-14 DIAGNOSIS — Z515 Encounter for palliative care: Secondary | ICD-10-CM | POA: Insufficient documentation

## 2021-12-14 MED ORDER — OXYCODONE HCL 5 MG PO TABS: 5.0000 mg | ORAL_TABLET | ORAL | 0 refills | Status: DC | PRN
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
## 2021-12-14 NOTE — Progress Notes (Signed)
Refill prn pain medication  she is currently taking 2-3x per day.

## 2021-12-14 NOTE — Progress Notes (Signed)
TANGELA, DOLLIVER ([id number]) [phone] [id number] Page 1 of 5 Visit Report for 12/13/2021 Abuse Risk Screen Details Patient Name: Date of Service: [name] [name], [name] [name] 12/13/2021 1:00 PM Medical Record Number: [id number] Patient Account Number: [id number] Date of Birth/Sex: Treating RN: 05-22-1925 (88 y.o. [name] Primary Care Zakariyya Helfman: Dewayne Shorter Other Clinician: Referring Praise Dolecki: Treating Dontez Hauss/Extender: Alfonzo Beers Weeks in Treatment: 0 Abuse Risk Screen Items Answer Electronic Signature(s) Signed: 12/13/2021 5:09:56 PM By: Rosalio Loud MSN RN CNS WTA Entered By: Rosalio Loud on 12/13/2021 14:15:46 -------------------------------------------------------------------------------- Activities of Daily Living Details Patient Name: Date of Service: [name] [name], [name] [name] 12/13/2021 1:00 PM Medical Record Number: [id number] Patient Account Number: [id number] Date of Birth/Sex: Treating RN: Apr 24, 1925 (88 y.o. [name] Primary Care Yuvonne Lanahan: Dewayne Shorter Other Clinician: Referring Shilah Hefel: Treating Caldonia Leap/Extender: Alfonzo Beers Weeks in Treatment: 0 Activities of Daily Living Items Answer Activities of Daily Living (Please select one for each item) Drive Automobile Not Able T Medications ake Need Assistance Use T elephone Need Assistance Care for Appearance Need Assistance Use T oilet Need Assistance Bath / Shower Need Assistance Dress Self Need Assistance Feed Self Completely Able Walk Need Assistance Get In / Out Bed Need Assistance Housework Not Able Prepare Meals Not Able Handle Money Need Assistance Shop for Self Not BRIGITTA, PRICER ([id number]) [phone] [id number].pdf Page 2 of 5 Electronic Signature(s) Signed: 12/13/2021 5:09:56 PM By: Rosalio Loud MSN RN CNS WTA Entered By: Rosalio Loud on 12/13/2021  14:15:50 -------------------------------------------------------------------------------- Education Screening Details Patient Name: Date of Service: [name] [name] 12/13/2021 1:00 PM Medical Record Number: [id number] Patient Account Number: [id number] Date of Birth/Sex: Treating RN: 09-24-1925 (88 y.o. [name] Primary Care Aymar Whitfill: Dewayne Shorter Other Clinician: Referring Livie Vanderhoof: Treating Bevelyn Arriola/Extender: Iris Pert in Treatment: 0 Learning Preferences/Education Level/Primary Language Learning Preference: Explanation Highest Education Level: College or Above Preferred Language: English Cognitive Barrier Language Barrier: No Translator Needed: No Memory Deficit: No Emotional Barrier: No Cultural/Religious Beliefs Affecting Medical Care: No Physical Barrier Impaired Vision: No Impaired Hearing: Yes Hearing Aid Decreased Hand dexterity: No Knowledge/Comprehension Knowledge Level: Medium Comprehension Level: High Ability to understand written instructions: Medium Ability to understand verbal instructions: Medium Motivation Anxiety Level: Calm Cooperation: Cooperative Education Importance: Acknowledges Need Interest in Health Problems: Asks Questions Perception: Coherent Willingness to Engage in Self-Management High Activities: Readiness to Engage in Self-Management High Activities: Electronic Signature(s) Signed: 12/13/2021 5:09:56 PM By: Rosalio Loud MSN RN CNS WTA Entered By: Rosalio Loud on 12/13/2021 14:15:54 [name] [name] ([id number]) [phone] [id number] Page 3 of 5 -------------------------------------------------------------------------------- Fall Risk Assessment Details Patient Name: Date of Service: [name] [name], [name] [name] 12/13/2021 1:00 PM Medical Record Number: [id number] Patient Account Number: [id number] Date of Birth/Sex: Treating RN: [birth year] (88 y.o. [name] Primary Care Shenae Bonanno:  Dewayne Shorter Other Clinician: Referring Mianna Iezzi: Treating Sedona Wenk/Extender: Alfonzo Beers Weeks in Treatment: 0 Fall Risk Assessment Items Have you had 2 or more falls in the last 12 monthso 0 No Have you had any fall that resulted in injury in the last 12 monthso 0 No FALLS RISK SCREEN History of falling - immediate or within 3 months 0 No Secondary diagnosis (Do you have 2 or more medical diagnoseso) 0 No Ambulatory aid None/bed rest/wheelchair/nurse 0 No Crutches/cane/walker 0 No Furniture 0 No Intravenous therapy Access/Saline/Heparin Lock 0 No Gait/Transferring Normal/ bed rest/ wheelchair 0 No Weak (short steps with or without shuffle, stooped but able to lift head while walking, may seek 0 No  support from furniture) Impaired (short steps with shuffle, may have difficulty arising from chair, head down, impaired 0 No balance) Mental Status Oriented to own ability 0 Yes Electronic Signature(s) Signed: 12/13/2021 5:09:56 PM By: Rosalio Loud MSN RN CNS WTA Entered By: Rosalio Loud on 12/13/2021 14:15:59 -------------------------------------------------------------------------------- Foot Assessment Details Patient Name: Date of Service: [name] [name] 12/13/2021 1:00 PM Medical Record Number: [id number] Patient Account Number: [id number] Date of Birth/Sex: Treating RN: June 29, 1925 (88 y.o. [name] Primary Care Walaa Carel: Dewayne Shorter Other Clinician: Referring Amyriah Buras: Treating Toniesha Zellner/Extender: Iris Pert in Treatment: 0 Foot Assessment Items Site Locations [place], [place] ([id number]) [phone] [id number] Page 4 of 5 + = Sensation present, - = Sensation absent, C = Callus, U = Ulcer R = Redness, W = Warmth, M = Maceration, PU = Pre-ulcerative lesion F = Fissure, S = Swelling, D = Dryness Assessment Right: Left: Other Deformity: No No Prior Foot Ulcer: No No Prior Amputation: No  No Charcot Joint: Yes Yes Ambulatory Status: Ambulatory With Help Assistance Device: Wheelchair Gait: Administrator, arts) Signed: 12/13/2021 5:09:56 PM By: Rosalio Loud MSN RN CNS WTA Entered By: Rosalio Loud on 12/13/2021 14:16:07 -------------------------------------------------------------------------------- Nutrition Risk Screening Details Patient Name: Date of Service: [name] [name] 12/13/2021 1:00 PM Medical Record Number: [id number] Patient Account Number: [id number] Date of Birth/Sex: Treating RN: April 20, 1925 (88 y.o. [name] Primary Care Frederick Klinger: Dewayne Shorter Other Clinician: Referring Yliana Gravois: Treating Rajon Bisig/Extender: Temple Pacini, Jordan Hawks Weeks in Treatment: 0 Height (in): 68 Weight (lbs): 150 Body Mass Index (BMI): 22.8 Nutrition Risk Screening Items Score Screening NUTRITION RISK SCREEN: I have an illness or condition that made me change the kind and/or amount of food I eat 2 Yes I eat fewer than two meals per day 0 No I eat few fruits and vegetables, or milk products 0 No I have three or more drinks of beer, liquor or wine almost every day 0 No I have tooth or mouth problems that make it hard for me to eat 2 Yes [name] [name], [name] [name] ([id number]) [phone] [id number] Page 5 of 5 I don't always have enough money to buy the food I need 0 No I eat alone most of the time 0 No I take three or more different prescribed or over-the-counter drugs a day 0 No Without wanting to, I have lost or gained 10 pounds in the last six months 2 Yes I am not always physically able to shop, cook and/or feed myself 2 Yes Nutrition Protocols Good Risk Protocol Moderate Risk Protocol 0 Provide education on nutrition High Risk Proctocol 0 Provide education on nutrition Risk Level: High Risk Score: 8 Electronic Signature(s) Signed: 12/13/2021 5:09:56 PM By: Rosalio Loud MSN RN CNS WTA Entered By: Rosalio Loud on 12/13/2021  14:16:03

## 2021-12-16 ENCOUNTER — Encounter: Payer: Self-pay | Admitting: Student

## 2021-12-16 ENCOUNTER — Non-Acute Institutional Stay (SKILLED_NURSING_FACILITY): Payer: Medicare Other | Admitting: Student

## 2021-12-16 DIAGNOSIS — L03116 Cellulitis of left lower limb: Secondary | ICD-10-CM

## 2021-12-16 DIAGNOSIS — T451X5D Adverse effect of antineoplastic and immunosuppressive drugs, subsequent encounter: Secondary | ICD-10-CM

## 2021-12-16 DIAGNOSIS — L03115 Cellulitis of right lower limb: Secondary | ICD-10-CM | POA: Diagnosis not present

## 2021-12-16 DIAGNOSIS — T148XXA Other injury of unspecified body region, initial encounter: Secondary | ICD-10-CM

## 2021-12-16 NOTE — Progress Notes (Unsigned)
Location:  Other [place] Room Number: [place] of Service:  SNF [phone]) Provider:  Dr. Amada Kingfisher, MD  Patient Care Team: Dewayne Shorter, MD as PCP - General (Family Medicine) Sindy Guadeloupe, MD as Consulting Physician (Hematology and Oncology)  Extended Emergency Contact Information Primary Emergency Contact: Lavina Hamman [place] of [place] Phone: [phone] Mobile Phone: [phone] Relation: Son Secondary Emergency Contact: Nelligan,[name] Address: [address]          [place], [place] [address] Johnnette Litter of [place] Phone: [phone] Mobile Phone: [phone] Relation: Son  Code Status:  DNR Goals of care: Advanced Directive information    12/16/2021   11:41 AM  Advanced Directives  Does Patient Have a Medical Advance Directive? Yes  Type of Paramedic of [place];Out of facility DNR (pink MOST or yellow form)  Does patient want to make changes to medical advance directive? No - Patient declined  Copy of [place] in Chart? Yes - validated most recent copy scanned in chart (See row information)     Chief Complaint  Patient presents with   Acute Visit    Wound Check.     HPI:  Pt is a 88 y.o. female seen today for an acute visit for wound check. Patient states she is doing well. She is glad her feet are improving. She has follow up on Tuesday.    Past Medical History:  Diagnosis Date   Actinic keratosis    Anemia    COPD (chronic obstructive pulmonary disease) (HCC)    Diverticulitis    GERD (gastroesophageal reflux disease)    Glaucoma    HLD (hyperlipidemia)    Hypertension    Iron deficiency    Rectal bleeding    Skin cancer    Nose, txted by Dr. Valere Dross at [facility]   Past Surgical History:  Procedure Laterality Date   ABDOMINAL HYSTERECTOMY     APPENDECTOMY     COLONOSCOPY WITH PROPOFOL N/A 04/14/2016   Procedure:  COLONOSCOPY WITH PROPOFOL;  Surgeon: Lucilla Lame, MD;  Location: [facility] ENDOSCOPY;  Service: Endoscopy;  Laterality: N/A;   ENTEROSCOPY N/A 05/10/2016   Procedure: Push ENTEROSCOPY with pediatric colonoscope;  Surgeon: [name], MD;  Location: [facility] ENDOSCOPY;  Service: Endoscopy;  Laterality: N/A;   ENTEROSCOPY N/A 05/30/2016   Procedure: push ENTEROSCOPY;  Surgeon: [name], MD;  Location: [facility] ENDOSCOPY;  Service: Endoscopy;  Laterality: N/A;   ENTEROSCOPY N/A 07/05/2017   Procedure: ENTEROSCOPY;  Surgeon: [name], MD;  Location: [facility] ENDOSCOPY;  Service: Gastroenterology;  Laterality: N/A;   ENTEROSCOPY N/A 06/21/2018   Procedure: ENTEROSCOPY;  Surgeon: [name], MD;  Location: [facility] ENDOSCOPY;  Service: Gastroenterology;  Laterality: N/A;   ESOPHAGOGASTRODUODENOSCOPY (EGD) WITH PROPOFOL N/A 08/12/2015   Procedure: ESOPHAGOGASTRODUODENOSCOPY (EGD) WITH PROPOFOL;  Surgeon: Lollie Sails, MD;  Location[facility] END[facility]COPY;  Service: Endoscopy;  Laterality: N/A;   ESOPHAGOGASTRODUODENOSCOPY (EGD) WITH PROPOFOL N/A 04/03/2016   Procedure: ESOPHAGOGASTRODUODENOSCOPY (EGD) WITH PROPOFOL;  Surgeon: Lucilla Lame, MD;  Location: [facility] ENDOSCOPY;  Service: Endoscopy;  Laterality: N/A;   GIVENS CAPSULE STUDY N/A 04/28/2016   Procedure: GIVENS CAPSULE STUDY;  Surgeon: [name], MD;  Location: [facility] ENDOSCOPY;  Service: Endoscopy;  Laterality: N/A;   GIVENS CAPSULE STUDY N/A 06/21/2017   Procedure: GIVENS CAPSULE STUDY;  Surgeon: [name], MD;  Location: [facility] ENDOSCOPY;  Service: Gastroenterology;  Laterality: N/A;   JOINT REPLACEMENT      Allergies  Allergen  Reactions   Nsaids Hives   Dextrans Hives   Fentanyl Itching   Lipitor [Atorvastatin] Other (See Comments)    Reaction: Muscle pain   Lovastatin    Mobic [Meloxicam] Other (See Comments)    Reaction: Mouth and tongue ulcers   Niacin And Related Itching   Other    Polysaccharide K Hives   Pravachol [Pravastatin Sodium] Other (See  Comments)    Reaction: Muscle pain   Statins Other (See Comments)   Tolmetin Hives   Voltaren [Diclofenac Sodium] Other (See Comments)    Reaction: Mouth and tongue ulcers   Zetia [Ezetimibe] Other (See Comments)    Reaction: Leg pain   Codeine Nausea And Vomiting   Niacin Itching    Outpatient Encounter Medications as of 12/16/2021  Medication Sig   acetaminophen (TYLENOL) 500 MG tablet Take 500-1,000 mg by mouth every 6 (six) hours as needed for mild pain, moderate pain or fever.   aluminum-magnesium hydroxide 200-200 MG/5ML suspension Take 30 mLs by mouth every 4 (four) hours as needed (For mouth ulcers). Mix with Lidocaine viscous 2 %   bisacodyl (DULCOLAX) 5 MG EC tablet Take 1 tablet (5 mg total) by mouth daily as needed for moderate constipation.   Cyanocobalamin (B-12) 1000 MCG CAPS Take 2,000 Units by mouth daily.    DULoxetine (CYMBALTA) 30 MG capsule Take 1 capsule (30 mg total) by mouth daily.   fluticasone (FLONASE) 50 MCG/ACT nasal spray Place 1 spray into both nostrils daily.   gabapentin (NEURONTIN) 300 MG capsule Take 1 capsule (300 mg total) by mouth 3 (three) times daily.   lactose free nutrition (BOOST) LIQD Take 237 mLs by mouth daily.   latanoprost (XALATAN) 0.005 % ophthalmic solution Place 1 drop into both eyes at bedtime.   lidocaine (XYLOCAINE) 2 % solution Use as directed in the mouth or throat. Mix with Maalox 30 mL and allow patient to apply to affected areas in mouth every 4 hours as needed for mouth ulcers   lidocaine-prilocaine (EMLA) cream Apply topically every 4 (four) hours as needed (skin pain).   losartan (COZAAR) 50 MG tablet Take 50 mg by mouth daily.   methocarbamol (ROBAXIN) 500 MG tablet Take 0.5 tablets (250 mg total) by mouth 2 (two) times daily.   mometasone (ELOCON) 0.1 % lotion Apply 1 application topically daily as needed (skin irritation).   Multiple Vitamins-Minerals (PRESERVISION AREDS 2) CAPS Take 1 capsule by mouth 2 (two) times daily.    omeprazole (PRILOSEC) 20 MG capsule Take 20 mg by mouth in the morning and at bedtime.   oxyCODONE (OXY IR/ROXICODONE) 5 MG immediate release tablet Take 1 tablet (5 mg total) by mouth every 4 (four) hours as needed for breakthrough pain.   oxyCODONE (OXYCONTIN) 15 mg 12 hr tablet Take 15 mg by mouth 3 (three) times daily.   Saccharomyces boulardii (PROBIOTIC) 250 MG CAPS Take 1 capsule by mouth 2 (two) times daily.   silver sulfADIAZINE (SILVADENE) 1 % cream Apply topically 2 (two) times daily. To skin wounds on feet   timolol (TIMOPTIC) 0.5 % ophthalmic solution Place 1 drop into both eyes daily.   [DISCONTINUED] cefdinir (OMNICEF) 300 MG capsule Take 300 mg by mouth 2 (two) times daily. For cellulitis   [DISCONTINUED] doxycycline (ADOXA) 100 MG tablet Take 100 mg by mouth 2 (two) times daily.   [DISCONTINUED] OXYCONTIN 15 MG 12 hr tablet Take 1 tablet (15 mg total) by mouth every 12 (twelve) hours. (Patient taking differently: Take 15 mg by  mouth 3 (three) times daily.)   Facility-Administered Encounter Medications as of 12/16/2021  Medication   0.9 %  sodium chloride infusion    Review of Systems  All other systems reviewed and are negative.   Immunization History  Administered Date(s) Administered   Fluad Quad(high Dose 65+) 11/07/2019   Influenza, High Dose Seasonal PF 10/19/2015, 11/06/2016   Influenza,inj,Quad PF,6+ Mos 11/12/2013, 03/04/2015   Influenza-Unspecified 09/22/2009, 11/25/2010, 10/10/2011   PFIZER Comirnaty(Gray Top)Covid-19 Tri-Sucrose Vaccine 02/10/2019, 03/03/2019   Pneumococcal Conjugate-13 03/04/2015, 09/19/2018   Pneumococcal Polysaccharide-23 09/30/2001   Td 01/27/2009   Pertinent  Health Maintenance Due  Topic Date Due   DEXA SCAN  Never done   INFLUENZA VACCINE  08/09/2021      11/26/2021    9:18 PM 11/27/2021   11:00 AM 11/27/2021    8:26 PM 11/28/2021   10:00 AM 11/28/2021   10:00 PM  Fall Risk  Patient Fall Risk Level High fall risk High  fall risk High fall risk High fall risk High fall risk   Functional Status Survey:    Vitals:   12/16/21 1126  BP: (!) 163/69  Pulse: 92  Resp: 16  Temp: 98.6 F (37 C)  SpO2: 92%  Weight: 147 lb 12.8 oz (67 kg)  Height: '5\' 7"'$  (1.702 m)   Body mass index is 23.15 kg/m. Physical Exam Constitutional:      Appearance: Normal appearance.  Skin:    Comments: Images below  Neurological:     Mental Status: She is alert.            Labs reviewed: Recent Labs    11/25/21 0632 11/26/21 0412 11/27/21 0556 11/29/21 0632  NA 132* 134*  --  135  K 3.7 4.1  --  3.9  CL 102 104  --  104  CO2 25 23  --  28  GLUCOSE 89 84  --  90  BUN 19 11  --  8  CREATININE 0.44 0.36* 0.41* 0.38*  CALCIUM 8.5* 8.7*  --  8.6*   Recent Labs    06/22/21 1251 11/24/21 1633 11/25/21 0632  AST 20 19 14*  ALT '11 9 9  '$ ALKPHOS 53 61 51  BILITOT 0.4 0.6 0.7  PROT 7.3 7.2 5.9*  ALBUMIN 3.8 3.0* 2.4*   Recent Labs    06/22/21 1251 09/22/21 1112 11/24/21 1633 11/25/21 0632 11/26/21 0412 11/29/21 0632  WBC 7.7 12.2* 16.2* 8.4 7.7 6.0  NEUTROABS 5.3 9.8* 13.2*  --   --   --   HGB 11.1* 11.7* 10.1* 8.3* 8.6* 9.0*  HCT 34.7* 35.4* 30.9* 25.1* 25.9* 27.2*  MCV 78.2* 83.5 80.5 79.7* 80.2 80.2  PLT 321 402* 448* 367 394 443*   No results found for: "TSH" No results found for: "HGBA1C" No results found for: "CHOL", "HDL", "LDLCALC", "LDLDIRECT", "TRIG", "CHOLHDL"  Significant Diagnostic Results in last 30 days:  DG Foot 2 Views Left  Result Date: 11/24/2021 CLINICAL DATA:  Cellulitis. Bilateral foot redness, swelling, and serous drainage starting 2 weeks ago. On oral Keflex for 2 days. Numerous abrasions, wounds, and skin breakdown within the bilateral feet. EXAM: LEFT FOOT - 2 VIEW COMPARISON:  None Available. FINDINGS: Moderate to high-grade hallux valgus. Moderate great toe metatarsophalangeal joint space narrowing with mild peripheral degenerative osteophytes. Mild-to-moderate  interphalangeal joint space narrowing diffusely. Moderate medial first tarsometatarsal joint space narrowing. Moderate peripheral lateral anterior process of the calcaneus degenerative osteophytosis at the calcaneocuboid joint. Mild midfoot and forefoot soft tissue swelling.  There are lucencies suspicious for edema and possibly air within the lateral ankle soft tissues, best seen on frontal view. There is an ulceration of the posterior heel soft tissues apparently contacting the calcaneal heel spur at the Achilles tendon insertion. No definite cortical erosion. Small plantar calcaneal heel spur. No acute fracture or dislocation. IMPRESSION: 1. Posterior heel soft tissue ulceration coming close to a posterior calcaneal heel spur at the Achilles tendon insertion. No definite cortical erosion to suggest osteomyelitis. 2. There are low densities suspicious for edema and fluid within the lateral ankle soft tissues, best seen on frontal view. This is suspicious for cellulitis. 3. Moderate to high-grade hallux valgus. Moderate great toe metatarsophalangeal joint osteoarthritis. Electronically Signed   By: Yvonne Kendall M.D.   On: 11/24/2021 17:52   DG Foot 2 Views Right  Result Date: 11/24/2021 CLINICAL DATA:  Cellulitis. Bilateral foot redness, swelling, and serous drainage starting 2 weeks ago. On oral Keflex for 2 days. Numerous abrasions, wounds, and skin breakdown within the bilateral feet. EXAM: RIGHT FOOT - 2 VIEW COMPARISON:  None Available. FINDINGS: Two screws are seen within the proximal half of the great toe metatarsal likely status post osteotomy. Mild hallux valgus. Mild great toe metatarsophalangeal joint space narrowing. Moderate lateral great toe interphalangeal joint space narrowing. Osseous fusion of the third PIP joint. Moderate joint space narrowing of the rest of the second through fifth interphalangeal joints. Small plantar calcaneal heel spur. Mild dorsal midfoot degenerative osteophytes.  Moderate midfoot and forefoot soft tissue swelling. No cortical erosion is seen. No subcutaneous emphysema. IMPRESSION: 1. Moderate midfoot and forefoot soft tissue swelling. No radiographic evidence of osteomyelitis. 2. Mild hallux valgus and great toe metatarsophalangeal joint osteoarthritis. Electronically Signed   By: Yvonne Kendall M.D.   On: 11/24/2021 17:47   DG Chest Port 1 View  Result Date: 11/24/2021 CLINICAL DATA:  Sepsis EXAM: PORTABLE CHEST 1 VIEW COMPARISON:  Radiograph 11/04/2019 FINDINGS: Unchanged cardiomediastinal silhouette. Faint right mid lung and lingular opacities. No pleural effusion or pneumothorax. No acute osseous abnormality. Thoracic spondylosis. IMPRESSION: Faint right mid lung and lingular opacities, possibly developing infection or atelectasis. PA and lateral chest radiograph would be useful, if able. Electronically Signed   By: Maurine Simmering M.D.   On: 11/24/2021 16:48    Assessment/Plan 1. Deep tissue injury 2. Bilateral lower leg cellulitis 3. Adverse effect of chemotherapy, subsequent encounter Patient's wounds improving with good granulation tissue. Bandages changed daily. Clean with hibiclens, normal saline, cover with xeroform, ABD and kerlix. New DTI on right 3rd toe. Will apply silvadene to the area. Continue to attempt offloading. Appreciate support of wound consultations. F/u on 12/12.   Family/ staff Communication: Nursing  Labs/tests ordered:  none
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
## 2021-12-20 ENCOUNTER — Other Ambulatory Visit: Payer: Self-pay | Admitting: Nurse Practitioner

## 2021-12-20 MED ORDER — OXYCODONE HCL ER 10 MG PO T12A: 10.0000 mg | EXTENDED_RELEASE_TABLET | Freq: Two times a day (BID) | ORAL | 0 refills | Status: DC

## 2021-12-20 MED FILL — Iron Sucrose Inj 20 MG/ML (Fe Equiv): INTRAVENOUS | Qty: 15 | Status: AC

## 2021-12-21 ENCOUNTER — Inpatient Hospital Stay: Payer: Medicare Other

## 2021-12-21 ENCOUNTER — Inpatient Hospital Stay (HOSPITAL_BASED_OUTPATIENT_CLINIC_OR_DEPARTMENT_OTHER): Payer: Medicare Other | Admitting: Oncology

## 2021-12-21 ENCOUNTER — Inpatient Hospital Stay: Payer: Medicare Other | Attending: Oncology

## 2021-12-21 ENCOUNTER — Encounter: Payer: Self-pay | Admitting: Oncology

## 2021-12-21 ENCOUNTER — Ambulatory Visit: Payer: Medicare Other

## 2021-12-21 VITALS — BP 161/81 | HR 78 | Temp 97.3°F | Wt 144.0 lb

## 2021-12-21 DIAGNOSIS — D509 Iron deficiency anemia, unspecified: Secondary | ICD-10-CM

## 2021-12-21 DIAGNOSIS — Z90710 Acquired absence of both cervix and uterus: Secondary | ICD-10-CM | POA: Diagnosis not present

## 2021-12-21 DIAGNOSIS — Z87891 Personal history of nicotine dependence: Secondary | ICD-10-CM | POA: Insufficient documentation

## 2021-12-21 LAB — CBC WITH DIFFERENTIAL/PLATELET
Abs Immature Granulocytes: 0.03 10*3/uL (ref 0.00–0.07)
Basophils Absolute: 0 10*3/uL (ref 0.0–0.1)
Basophils Relative: 0 %
Eosinophils Absolute: 0 10*3/uL (ref 0.0–0.5)
Eosinophils Relative: 0 %
HCT: 31.2 % — ABNORMAL LOW (ref 36.0–46.0)
Hemoglobin: 10 g/dL — ABNORMAL LOW (ref 12.0–15.0)
Immature Granulocytes: 0 %
Lymphocytes Relative: 10 %
Lymphs Abs: 1 10*3/uL (ref 0.7–4.0)
MCH: 25.9 pg — ABNORMAL LOW (ref 26.0–34.0)
MCHC: 32.1 g/dL (ref 30.0–36.0)
MCV: 80.8 fL (ref 80.0–100.0)
Monocytes Absolute: 1.2 10*3/uL — ABNORMAL HIGH (ref 0.1–1.0)
Monocytes Relative: 12 %
Neutro Abs: 7.6 10*3/uL (ref 1.7–7.7)
Neutrophils Relative %: 78 %
Platelets: 472 10*3/uL — ABNORMAL HIGH (ref 150–400)
RBC: 3.86 MIL/uL — ABNORMAL LOW (ref 3.87–5.11)
RDW: 15.8 % — ABNORMAL HIGH (ref 11.5–15.5)
WBC: 9.8 10*3/uL (ref 4.0–10.5)
nRBC: 0 % (ref 0.0–0.2)

## 2021-12-21 LAB — IRON AND TIBC
Iron: 21 ug/dL — ABNORMAL LOW (ref 28–170)
Saturation Ratios: 10 % — ABNORMAL LOW (ref 10.4–31.8)
TIBC: 206 ug/dL — ABNORMAL LOW (ref 250–450)
UIBC: 185 ug/dL

## 2021-12-21 LAB — FERRITIN: Ferritin: 336 ng/mL — ABNORMAL HIGH (ref 11–307)

## 2021-12-21 NOTE — Progress Notes (Signed)
Hematology/Oncology Consult note [facility]  [phone] [phone]  Patient Care Team: Dewayne Shorter, MD as PCP - General (Family Medicine) Sindy Guadeloupe, MD as Consulting Physician (Hematology and Oncology)   Name of the patient: [name] [name]  [id number]  Aug 03, 1925   Date of visit: 12/21/21  Diagnosis-iron deficiency anemia  Chief complaint/ Reason for visit-routine follow-up of iron deficiency anemia  Heme/Onc history: Patient is a 88 year old female who was referred to the clinic in October 2017 for chronic iron deficiency anemia with history of diverticulosis. She has been unable to tolerate oral iron supplements due to abdominal cramping and diarrhea. She has required several blood transfusions in the past and received regular IV iron infusions. She has tolerated these without complications. Iron and blood transfusions have been given on a palliative basis with a goal hemoglobin of >10.    March 2018 she underwent an EGD and colonoscopy which did not reveal source of bleeding.   May  2/18 - enteroscopy revealed 3 avms which were ablated. She suffered melena and had a hemoglobin of 6.7 and was hospitalized. At that time, enteroscopy was repeated which was normal. She received a blood transfusion and her hemoglobin was 9.6.    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    08/07/2016 Capsule study showed active bleeding from single spot in proximal jejunum. 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  She could not tolerate palliative radiation treatment.  Patient has received 3 cycles of Keytruda so far at [facility].  Also received azithromycin for pulmonary symptoms patient then developed significant bilateral rash in  her extremities and treatment has been paused.  Interval history-patient feels poorly overall.  She has bilateral foot ulcerations and has bandages in place.  The left foot has healed to a significant extent but the right foot is still healing poorly.  She continues to have pain in her mouth from her recurrent squamous cell cancer.  ECOG PS- 3 Pain scale- 5   Review of systems- Review of Systems  Constitutional:  Positive for malaise/fatigue. Negative for chills, fever and weight loss.  HENT:  Negative for congestion, ear discharge and nosebleeds.        Mouth pain  Eyes:  Negative for blurred vision.  Respiratory:  Negative for cough, hemoptysis, sputum production, shortness of breath and wheezing.   Cardiovascular:  Negative for chest pain, palpitations, orthopnea and claudication.  Gastrointestinal:  Negative for abdominal pain, blood in stool, constipation, diarrhea, heartburn, melena, nausea and vomiting.  Genitourinary:  Negative for dysuria, flank pain, frequency, hematuria and urgency.  Musculoskeletal:  Negative for back pain, joint pain and myalgias.  Skin:  Negative for rash.  Neurological:  Negative for dizziness, tingling, focal weakness, seizures, weakness and headaches.  Endo/Heme/Allergies:  Does not bruise/bleed easily.  Psychiatric/Behavioral:  Negative for depression and suicidal ideas. The patient does not have insomnia.       Allergies  Allergen Reactions   Nsaids Hives   Dextrans Hives   Fentanyl Itching   Lipitor [Atorvastatin] Other (See Comments)    Reaction: Muscle pain   Lovastatin    Mobic [Meloxicam] Other (See Comments)    Reaction: Mouth and tongue ulcers   Niacin And Related Itching   Other    Polysaccharide K Hives  Pravachol [Pravastatin Sodium] Other (See Comments)    Reaction: Muscle pain   Statins Other (See Comments)   Tolmetin Hives   Voltaren [Diclofenac Sodium] Other (See Comments)    Reaction: Mouth and tongue ulcers   Zetia  [Ezetimibe] Other (See Comments)    Reaction: Leg pain   Codeine Nausea And Vomiting   Niacin Itching     Past Medical History:  Diagnosis Date   Actinic keratosis    Anemia    COPD (chronic obstructive pulmonary disease) (HCC)    Diverticulitis    GERD (gastroesophageal reflux disease)    Glaucoma    HLD (hyperlipidemia)    Hypertension    Iron deficiency    Rectal bleeding    Skin cancer    Nose, txted by Dr. Valere Dross at [facility]     Past Surgical History:  Procedure Laterality Date   ABDOMINAL HYSTERECTOMY     APPENDECTOMY     COLONOSCOPY WITH PROPOFOL N/A 04/14/2016   Procedure: COLONOSCOPY WITH PROPOFOL;  Surgeon: Lucilla Lame, MD;  Location: [facility] ENDOSCOPY;  Service: Endoscopy;  Laterality: N/A;   ENTEROSCOPY N/A 05/10/2016   Procedure: Push ENTEROSCOPY with pediatric colonoscope;  Surgeon: [name], MD;  Location: [facility] ENDOSCOPY;  Service: Endoscopy;  Laterality: N/A;   ENTEROSCOPY N/A 05/30/2016   Procedure: push ENTEROSCOPY;  Surgeon: [name], MD;  Location: [facility] ENDOSCOPY;  Service: Endoscopy;  Laterality: N/A;   ENTEROSCOPY N/A 07/05/2017   Procedure: ENTEROSCOPY;  Surgeon: [name], MD;  Location: [facility] ENDOSCOPY;  Service: Gastroenterology;  Laterality: N/A;   ENTEROSCOPY N/A 06/21/2018   Procedure: ENTEROSCOPY;  Surgeon: [name], MD;  Location: [facility] ENDOSCOPY;  Service: Gastroenterology;  Laterality: N/A;   ESOPHAGOGASTRODUODENOSCOPY (EGD) WITH PROPOFOL N/A 08/12/2015   Procedure: ESOPHAGOGASTRODUODENOSCOPY (EGD) WITH PROPOFOL;  Surgeon: Lollie Sails, MD;  Location: [facility] ENDOSCOPY;  Service: Endoscopy;  Laterality: N/A;   ESOPHAGOGASTRODUODENOSCOPY (EGD) WITH PROPOFOL N/A 04/03/2016   Procedure: ESOPHAGOGASTRODUODENOSCOPY (EGD) WITH PROPOFOL;  Surgeon: Lucilla Lame, MD;  Location: [facility] ENDOSCOPY;  Service: Endoscopy;  Laterality: N/A;   GIVENS CAPSULE STUDY N/A 04/28/2016   Procedure: GIVENS CAPSULE STUDY;  Surgeon: [name], MD;  Location: [facility] ENDOSCOPY;   Service: Endoscopy;  Laterality: N/A;   GIVENS CAPSULE STUDY N/A 06/21/2017   Procedure: GIVENS CAPSULE STUDY;  Surgeon: [name], MD;  Location: [facility] ENDOSCOPY;  Service: Gastroenterology;  Laterality: N/A;   JOINT REPLACEMENT      Social History   Socioeconomic History   Marital status: Widowed    Spouse name: Not on file   Number of children: Not on file   Years of education: Not on file   Highest education level: Not on file  Occupational History   Not on file  Tobacco Use   Smoking status: Former    Packs/day: 2.00    Years: 20.00    Total pack years: 40.00    Types: Cigarettes    Quit date: 04/22/1979    Years since quitting: 42.6   Smokeless tobacco: Never  Vaping Use   Vaping Use: Never used  Substance and Sexual Activity   Alcohol use: Not Currently    Alcohol/week: 1.0 standard drink of alcohol    Types: 1 Glasses of wine per week   Drug use: No   Sexual activity: Not Currently    Birth control/protection: Post-menopausal  Other Topics Concern   Not on file  Social History Narrative   Not on file   Social Determinants of Health   Financial Resource Strain:  Not on file  Food Insecurity: No Food Insecurity (11/24/2021)   Hunger Vital Sign    Worried About Running Out of Food in the Last Year: Never true    Ran Out of Food in the Last Year: Never true  Transportation Needs: No Transportation Needs (11/24/2021)   PRAPARE - Hydrologist (Medical): No    Lack of Transportation (Non-Medical): No  Physical Activity: Not on file  Stress: Not on file  Social Connections: Not on file  Intimate Partner Violence: Not At Risk (11/24/2021)   Humiliation, Afraid, Rape, and Kick questionnaire    Fear of Current or Ex-Partner: No    Emotionally Abused: No    Physically Abused: No    Sexually Abused: No    Family History  Problem Relation Age of Onset   CAD Mother    CAD Father    CAD Brother      Current Outpatient Medications:     acetaminophen (TYLENOL) 500 MG tablet, Take 500-1,000 mg by mouth every 6 (six) hours as needed for mild pain, moderate pain or fever., Disp: , Rfl:    aluminum-magnesium hydroxide 200-200 MG/5ML suspension, Take 30 mLs by mouth every 4 (four) hours as needed (For mouth ulcers). Mix with Lidocaine viscous 2 %, Disp: , Rfl:    bisacodyl (DULCOLAX) 5 MG EC tablet, Take 1 tablet (5 mg total) by mouth daily as needed for moderate constipation., Disp: 30 tablet, Rfl: 0   Cyanocobalamin (B-12) 1000 MCG CAPS, Take 2,000 Units by mouth daily. , Disp: , Rfl:    DULoxetine (CYMBALTA) 30 MG capsule, Take 1 capsule (30 mg total) by mouth daily., Disp: , Rfl: 3   fluticasone (FLONASE) 50 MCG/ACT nasal spray, Place 1 spray into both nostrils daily., Disp: , Rfl:    gabapentin (NEURONTIN) 300 MG capsule, Take 1 capsule (300 mg total) by mouth 3 (three) times daily., Disp: 90 capsule, Rfl: 0   lactose free nutrition (BOOST) LIQD, Take 237 mLs by mouth daily., Disp: , Rfl:    latanoprost (XALATAN) 0.005 % ophthalmic solution, Place 1 drop into both eyes at bedtime., Disp: , Rfl:    lidocaine (XYLOCAINE) 2 % solution, Use as directed in the mouth or throat. Mix with Maalox 30 mL and allow patient to apply to affected areas in mouth every 4 hours as needed for mouth ulcers, Disp: , Rfl:    lidocaine-prilocaine (EMLA) cream, Apply topically every 4 (four) hours as needed (skin pain)., Disp: 30 g, Rfl: 0   losartan (COZAAR) 50 MG tablet, Take 50 mg by mouth daily., Disp: , Rfl:    methocarbamol (ROBAXIN) 500 MG tablet, Take 0.5 tablets (250 mg total) by mouth 2 (two) times daily., Disp: 30 tablet, Rfl: 5   mometasone (ELOCON) 0.1 % lotion, Apply 1 application topically daily as needed (skin irritation)., Disp: , Rfl:    Multiple Vitamins-Minerals (PRESERVISION AREDS 2) CAPS, Take 1 capsule by mouth 2 (two) times daily., Disp: , Rfl:    omeprazole (PRILOSEC) 20 MG capsule, Take 20 mg by mouth in the morning and at  bedtime., Disp: , Rfl:    oxyCODONE (OXYCONTIN) 10 mg 12 hr tablet, Take 1 tablet (10 mg total) by mouth every 12 (twelve) hours., Disp: 60 tablet, Rfl: 0   Saccharomyces boulardii (PROBIOTIC) 250 MG CAPS, Take 1 capsule by mouth 2 (two) times daily., Disp: , Rfl:    silver sulfADIAZINE (SILVADENE) 1 % cream, Apply topically 2 (two) times daily. To  skin wounds on feet, Disp: 400 g, Rfl: 0   timolol (TIMOPTIC) 0.5 % ophthalmic solution, Place 1 drop into both eyes daily., Disp: , Rfl:  No current facility-administered medications for this visit.  Facility-Administered Medications Ordered in Other Visits:    0.9 %  sodium chloride infusion, , Intravenous, Continuous, Sindy Guadeloupe, MD, Last Rate: 0 mL/hr at 10/08/17 1518, New Bag at 06/21/18 8299  Physical exam: There were no vitals filed for this visit. Physical Exam Constitutional:      Comments: Sitting in a wheelchair and appears in distress from pain.  HENT:     Mouth/Throat:     Comments: Exophytic mass in the left buccal mucosa Cardiovascular:     Rate and Rhythm: Normal rate and regular rhythm.     Heart sounds: Normal heart sounds.  Pulmonary:     Effort: Pulmonary effort is normal.     Breath sounds: Normal breath sounds.  Abdominal:     General: Bowel sounds are normal.     Palpations: Abdomen is soft.  Musculoskeletal:     Comments: Bilateral feet are dressed in Ace wrap  Skin:    General: Skin is warm and dry.  Neurological:     Mental Status: She is alert and oriented to person, place, and time.         Latest Ref Rng & Units 11/29/2021    6:32 AM  CMP  Glucose 70 - 99 mg/dL 90   BUN 8 - 23 mg/dL 8   Creatinine 0.44 - 1.00 mg/dL 0.38   Sodium 135 - 145 mmol/L 135   Potassium 3.5 - 5.1 mmol/L 3.9   Chloride 98 - 111 mmol/L 104   CO2 22 - 32 mmol/L 28   Calcium 8.9 - 10.3 mg/dL 8.6       Latest Ref Rng & Units 12/21/2021   12:38 PM  CBC  WBC 4.0 - 10.5 K/uL 9.8   Hemoglobin 12.0 - 15.0 g/dL 10.0    Hematocrit 36.0 - 46.0 % 31.2   Platelets 150 - 400 K/uL 472     No images are attached to the encounter.  DG Foot 2 Views Left  Result Date: 11/24/2021 CLINICAL DATA:  Cellulitis. Bilateral foot redness, swelling, and serous drainage starting 2 weeks ago. On oral Keflex for 2 days. Numerous abrasions, wounds, and skin breakdown within the bilateral feet. EXAM: LEFT FOOT - 2 VIEW COMPARISON:  None Available. FINDINGS: Moderate to high-grade hallux valgus. Moderate great toe metatarsophalangeal joint space narrowing with mild peripheral degenerative osteophytes. Mild-to-moderate interphalangeal joint space narrowing diffusely. Moderate medial first tarsometatarsal joint space narrowing. Moderate peripheral lateral anterior process of the calcaneus degenerative osteophytosis at the calcaneocuboid joint. Mild midfoot and forefoot soft tissue swelling. There are lucencies suspicious for edema and possibly air within the lateral ankle soft tissues, best seen on frontal view. There is an ulceration of the posterior heel soft tissues apparently contacting the calcaneal heel spur at the Achilles tendon insertion. No definite cortical erosion. Small plantar calcaneal heel spur. No acute fracture or dislocation. IMPRESSION: 1. Posterior heel soft tissue ulceration coming close to a posterior calcaneal heel spur at the Achilles tendon insertion. No definite cortical erosion to suggest osteomyelitis. 2. There are low densities suspicious for edema and fluid within the lateral ankle soft tissues, best seen on frontal view. This is suspicious for cellulitis. 3. Moderate to high-grade hallux valgus. Moderate great toe metatarsophalangeal joint osteoarthritis. Electronically Signed   By: Viann Fish.D.  On: 11/24/2021 17:52   DG Foot 2 Views Right  Result Date: 11/24/2021 CLINICAL DATA:  Cellulitis. Bilateral foot redness, swelling, and serous drainage starting 2 weeks ago. On oral Keflex for 2 days. Numerous  abrasions, wounds, and skin breakdown within the bilateral feet. EXAM: RIGHT FOOT - 2 VIEW COMPARISON:  None Available. FINDINGS: Two screws are seen within the proximal half of the great toe metatarsal likely status post osteotomy. Mild hallux valgus. Mild great toe metatarsophalangeal joint space narrowing. Moderate lateral great toe interphalangeal joint space narrowing. Osseous fusion of the third PIP joint. Moderate joint space narrowing of the rest of the second through fifth interphalangeal joints. Small plantar calcaneal heel spur. Mild dorsal midfoot degenerative osteophytes. Moderate midfoot and forefoot soft tissue swelling. No cortical erosion is seen. No subcutaneous emphysema. IMPRESSION: 1. Moderate midfoot and forefoot soft tissue swelling. No radiographic evidence of osteomyelitis. 2. Mild hallux valgus and great toe metatarsophalangeal joint osteoarthritis. Electronically Signed   By: Yvonne Kendall M.D.   On: 11/24/2021 17:47   DG Chest Port 1 View  Result Date: 11/24/2021 CLINICAL DATA:  Sepsis EXAM: PORTABLE CHEST 1 VIEW COMPARISON:  Radiograph 11/04/2019 FINDINGS: Unchanged cardiomediastinal silhouette. Faint right mid lung and lingular opacities. No pleural effusion or pneumothorax. No acute osseous abnormality. Thoracic spondylosis. IMPRESSION: Faint right mid lung and lingular opacities, possibly developing infection or atelectasis. PA and lateral chest radiograph would be useful, if able. Electronically Signed   By: Maurine Simmering M.D.   On: 11/24/2021 16:48     Assessment and plan- Patient is a 88 y.o. female here for routine follow-up of iron deficiency anemia  Hemoglobin is better today at 10 as compared to 8.3 a month ago.  Ferritin levels are elevated at 336 but could be a marker of acute inflammation.  Iron saturation remains low at 10%.  Patient would like to hold off on IV iron at this time given her ongoing mouth pain as well as foot ulcerations.  I will check CBC ferritin  and iron studies in 2, 4 and 6 months and see her back in 6 months   Visit Diagnosis 1. Iron deficiency anemia, unspecified iron deficiency anemia type      Dr. Randa Evens, MD, MPH [facility] at [facility] [phone] 12/21/2021 1:13 PM

## 2021-12-22 ENCOUNTER — Encounter: Payer: Medicare Other | Admitting: Physician Assistant

## 2021-12-22 DIAGNOSIS — L97822 Non-pressure chronic ulcer of other part of left lower leg with fat layer exposed: Secondary | ICD-10-CM | POA: Diagnosis not present

## 2021-12-22 NOTE — Progress Notes (Addendum)
[name] [name] ([id number]) [id number] Page 1 of 7 Visit Report for 12/22/2021 Chief Complaint Document Details Patient Name: Date of Service: [name] [name], [name] [name] 12/22/2021 3:15 PM Medical Record Number: [id number] Patient Account Number: [id number] Date of Birth/Sex: Treating RN: 1925-12-22 (88 y.o. [name] [name] Primary Care Provider: Dewayne Shorter Other Clinician: Referring Provider: Treating Provider/Extender: [name] [name] Weeks in Treatment: 1 Information Obtained from: Patient Chief Complaint Bilateral LE Ulcers Electronic Signature([name]) Signed: 12/22/2021 4:55:52 PM By: [name] [name] Previous Signature: 12/22/2021 3:35:45 PM Version By: [name] [name] Entered By: [name] Keeler on 12/22/2021 16:55:52 -------------------------------------------------------------------------------- HPI Details Patient Name: Date of Service: [name] [name] 12/22/2021 3:15 PM Medical Record Number: [id number] Patient Account Number: [id number] Date of Birth/Sex: Treating RN: [birth year] (88 y.o. [name] [name] Primary Care Provider: Dewayne Shorter Other Clinician: Referring Provider: Treating Provider/Extender: Temple Pacini, Jordan Hawks Weeks in Treatment: 1 History of Present Illness HPI Description: 12-13-2021 patient presents today for initial inspection here in our clinic as a referral from [facility] regarding issues that she has been having with her feet although it is really not just her feet but her legs as well as her hands and her back and her thighs as well. This appears to have been a reaction from the chemotherapy as best we can tell. With that being said fortunately she seems to be getting better for the most part across the board including her feet although she still has a significant wound on the right foot and the feet in general still have a lot of very dry skin remaining at this point. Fortunately there does  not appear to be any signs of infection locally nor systemically which is great news. She is seen with her son-in-law here in the clinic today. Patient does have a history of COPD, hypertension, anemia of chronic disease, and she does have mouth cancer as well that is what she has been undergoing chemotherapy and radiation for. 12-22-2021 upon evaluation today patient appears to be doing well currently in regard to her wounds. In fact a lot of them are completely healed as of today. She also has a new wound opened up on her back unfortunately this appears to be more of a pressure injury at this point. I am concerned more about that but I am anything else to be perfectly honest. [name] [name], [name] [name] ([id number]) [id number] Page 2 of 7 Electronic Signature([name]) Signed: 12/22/2021 4:56:05 PM By: [name] [name] Entered By: [name] Keeler on 12/22/2021 16:56:05 -------------------------------------------------------------------------------- Physical Exam Details Patient Name: Date of Service: [name] [name], [name] [name] 12/22/2021 3:15 PM Medical Record Number: [id number] Patient Account Number: [id number] Date of Birth/Sex: Treating RN: Dec 10, 1925 (88 y.o. [name] [name] Primary Care Provider: Dewayne Shorter Other Clinician: Referring Provider: Treating Provider/Extender: [name] [name] Weeks in Treatment: 1 Constitutional Well-nourished and well-hydrated in no acute distress. Respiratory normal breathing without difficulty. Psychiatric this patient is able to make decisions and demonstrates good insight into disease process. Alert and Oriented x 3. pleasant and cooperative. Notes Upon inspection patient'[name] wound bed actually showed signs again of healing a lot of areas she looks to be doing excellent at this point. I am very pleased with where we stand I do think she is headed in the right direction here. There does not appear to be any signs of active  infection locally nor systemically which is great news. Overall the biggest issue I see is that of her back where she does  seem to have a pressure injury here it does not appear to be too deep but we do not want her to get that way. I do think a Roho mosaic cushion could be beneficial. Electronic Signature([name]) Signed: 12/22/2021 4:56:35 PM By: [name] [name] Entered By: [name] Keeler on 12/22/2021 16:56:34 -------------------------------------------------------------------------------- Physician Orders Details Patient Name: Date of Service: [name] [name] 12/22/2021 3:15 PM Medical Record Number: [id number] Patient Account Number: [id number] Date of Birth/Sex: Treating RN: 1925-12-07 (88 y.o. [name] [name] Primary Care Provider: Dewayne Shorter Other Clinician: Referring Provider: Treating Provider/Extender: [name] [name] Weeks in Treatment: 1 Verbal / Phone Orders: No Diagnosis Coding ICD-10 Coding Code Description L98.8 Other specified disorders of the skin and subcutaneous tissue Barto, [name] [name] ([id number]) [id number] Page 3 of 7 [phone] Non-pressure chronic ulcer of other part of left lower leg with fat layer exposed L97.812 Non-pressure chronic ulcer of other part of right lower leg with fat layer exposed L89.893 Pressure ulcer of other site, stage 3 J44.9 Chronic obstructive pulmonary disease, unspecified I10 Essential (primary) hypertension D63.8 Anemia in other chronic diseases classified elsewhere C06.9 Malignant neoplasm of mouth, unspecified Follow-up Appointments Return Appointment in 1 week. Bathing/ Shower/ Hygiene Clean wound with Normal Saline or wound cleanser. Anesthetic (Use 'Patient Medications' Section for Anesthetic Order Entry) Wound #1 Right,Distal,Dorsal,Anterior Foot Lidocaine applied to wound bed Wound Treatment Wound #1 - Foot Wound Laterality: Dorsal, Right, Anterior, Distal Cleanser: Normal  Saline 1 x Per Day/30 Days Discharge Instructions: Wash your hands with soap and water. Remove old dressing, discard into plastic bag and place into trash. Cleanse the wound with Normal Saline prior to applying a clean dressing using gauze sponges, not tissues or cotton balls. Do not scrub or use excessive force. Pat dry using gauze sponges, not tissue or cotton balls. Prim Dressing: Xeroform 4x4-HBD (in/in) 1 x Per Day/30 Days ary Discharge Instructions: Apply Xeroform 4x4-HBD (in/in) as directed Secondary Dressing: ABD Pad 5x9 (in/in) 1 x Per Day/30 Days Discharge Instructions: Cover with ABD pad Secured With: Kerlix Roll Sterile or Non-Sterile 6-ply 4.5x4 (yd/yd) 1 x Per Day/30 Days Discharge Instructions: Apply Kerlix as directed Wound #6 - Lower Leg Wound Laterality: Left, Anterior Cleanser: Normal Saline 1 x Per Day/30 Days Discharge Instructions: Wash your hands with soap and water. Remove old dressing, discard into plastic bag and place into trash. Cleanse the wound with Normal Saline prior to applying a clean dressing using gauze sponges, not tissues or cotton balls. Do not scrub or use excessive force. Pat dry using gauze sponges, not tissue or cotton balls. Prim Dressing: Xeroform 4x4-HBD (in/in) 1 x Per Day/30 Days ary Discharge Instructions: Apply Xeroform 4x4-HBD (in/in) as directed Secondary Dressing: ABD Pad 5x9 (in/in) 1 x Per Day/30 Days Discharge Instructions: Cover with ABD pad Secured With: Kerlix Roll Sterile or Non-Sterile 6-ply 4.5x4 (yd/yd) 1 x Per Day/30 Days Discharge Instructions: Apply Kerlix as directed Electronic Signature([name]) Signed: 12/22/2021 5:25:23 PM By: Rosalio Loud MSN RN CNS WTA Signed: 12/23/2021 12:47:22 PM By: [name] [name] Entered By: Rosalio Loud on 12/22/2021 17:25:23 -------------------------------------------------------------------------------- Problem List Details Patient Name: Date of Service: [name] [name] 12/22/2021 3:15  PM Medical Record Number: [id number] Patient Account Number: [id number] [name] [name], [name] [name] ([id number]) [id number] Page 4 of 7 Date of Birth/Sex: Treating RN: Mar 15, 1925 (88 y.o. [name] [name] Primary Care Provider: Other Clinician: Dewayne Shorter Referring Provider: Treating Provider/Extender: Iris Pert in Treatment: 1 Active Problems ICD-10 Encounter Code Description Active  Date MDM Diagnosis L98.8 Other specified disorders of the skin and subcutaneous tissue 12/13/2021 No Yes L97.822 Non-pressure chronic ulcer of other part of left lower leg with fat layer exposed12/05/2021 No Yes L97.812 Non-pressure chronic ulcer of other part of right lower leg with fat layer 12/13/2021 No Yes exposed L89.893 Pressure ulcer of other site, stage 3 12/22/2021 No Yes J44.9 Chronic obstructive pulmonary disease, unspecified 12/13/2021 No Yes I10 Essential (primary) hypertension 12/13/2021 No Yes D63.8 Anemia in other chronic diseases classified elsewhere 12/13/2021 No Yes C06.9 Malignant neoplasm of mouth, unspecified 12/13/2021 No Yes Inactive Problems Resolved Problems Electronic Signature([name]) Signed: 12/22/2021 4:55:47 PM By: [name] [name] Previous Signature: 12/22/2021 3:35:41 PM Version By: [name] [name] Entered By: [name] Keeler on 12/22/2021 16:55:47 -------------------------------------------------------------------------------- Progress Note Details Patient Name: Date of Service: [name] [name] 12/22/2021 3:15 PM Medical Record Number: [id number] Patient Account Number: [id number] Date of Birth/Sex: Treating RN: 1925/12/17 (88 y.o. [name] [name] Primary Care Provider: Dewayne Shorter Other Clinician: Referring Provider: Treating Provider/Extender: Iris Pert in Treatment: 1 [name] [name], [name] [name] ([id number]) [id number] Page 5 of 7 Subjective Chief  Complaint Information obtained from Patient Bilateral LE Ulcers History of Present Illness (HPI) 12-13-2021 patient presents today for initial inspection here in our clinic as a referral from [facility] regarding issues that she has been having with her feet although it is really not just her feet but her legs as well as her hands and her back and her thighs as well. This appears to have been a reaction from the chemotherapy as best we can tell. With that being said fortunately she seems to be getting better for the most part across the board including her feet although she still has a significant wound on the right foot and the feet in general still have a lot of very dry skin remaining at this point. Fortunately there does not appear to be any signs of infection locally nor systemically which is great news. She is seen with her son-in-law here in the clinic today. Patient does have a history of COPD, hypertension, anemia of chronic disease, and she does have mouth cancer as well that is what she has been undergoing chemotherapy and radiation for. 12-22-2021 upon evaluation today patient appears to be doing well currently in regard to her wounds. In fact a lot of them are completely healed as of today. She also has a new wound opened up on her back unfortunately this appears to be more of a pressure injury at this point. I am concerned more about that but I am anything else to be perfectly honest. Objective Constitutional Well-nourished and well-hydrated in no acute distress. Vitals Time Taken: 3:40 PM, Height: 68 in, Weight: 150 lbs, BMI: 22.8, Temperature: 98.3 F, Pulse: 76 bpm, Respiratory Rate: 18 breaths/min, Blood Pressure: 172/81 mmHg. Respiratory normal breathing without difficulty. Psychiatric this patient is able to make decisions and demonstrates good insight into disease process. Alert and Oriented x 3. pleasant and cooperative. General Notes: Upon inspection patient'[name] wound bed  actually showed signs again of healing a lot of areas she looks to be doing excellent at this point. I am very pleased with where we stand I do think she is headed in the right direction here. There does not appear to be any signs of active infection locally nor systemically which is great news. Overall the biggest issue I see is that of her back where she does seem to have a pressure injury  here it does not appear to be too deep but we do not want her to get that way. I do think a Roho mosaic cushion could be beneficial. Integumentary (Hair, Skin) Wound #1 status is Open. Original cause of wound was Gradually Appeared. The date acquired was: 11/15/2021. The wound has been in treatment 1 weeks. The wound is located on the [place]. The wound measures 6.2cm length x 4.7cm width x 0.2cm depth; 22.887cm^2 area and 4.577cm^3 volume. There is Fat Layer (Subcutaneous Tissue) exposed. There is a medium amount of serosanguineous drainage noted. There is small (1-33%) red, pink, friable granulation within the wound bed. There is a small (1-33%) amount of necrotic tissue within the wound bed including Adherent Slough. Wound #2 status is Healed - Epithelialized. Original cause of wound was Gradually Appeared. The date acquired was: 11/15/2021. The wound has been in treatment 1 weeks. The wound is located on the [place]. The wound measures 0cm length x 0cm width x 0cm depth; 0cm^2 area and 0cm^3 volume. There is Fat Layer (Subcutaneous Tissue) exposed. There is a small amount of serous drainage noted. There is no granulation within the wound bed. There is no necrotic tissue within the wound bed. Wound #3 status is Healed - Epithelialized. Original cause of wound was Gradually Appeared. The date acquired was: 11/15/2021. The wound has been in treatment 1 weeks. The wound is located on the Right,Anterior Lower Leg. The wound measures 0cm length x 0cm width x 0cm depth; 0cm^2  area and 0cm^3 volume. There is Fat Layer (Subcutaneous Tissue) exposed. There is a none present amount of drainage noted. There is small (1-33%) red granulation within the wound bed. Wound #4 status is Healed - Epithelialized. Original cause of wound was Gradually Appeared. The date acquired was: 11/15/2021. The wound has been in treatment 1 weeks. The wound is located on the [place]. The wound measures 0cm length x 0cm width x 0cm depth; 0cm^2 area and 0cm^3 volume. There is a small amount of serous drainage noted. There is no granulation within the wound bed. There is no necrotic tissue within the wound bed. Wound #5 status is Healed - Epithelialized. Original cause of wound was Gradually Appeared. The date acquired was: 11/15/2021. The wound has been in treatment 1 weeks. The wound is located on the Left,Lateral Foot. The wound measures 0cm length x 0cm width x 0cm depth; 0cm^2 area and 0cm^3 volume. There is Fat Layer (Subcutaneous Tissue) exposed. There is a none present amount of drainage noted. There is no granulation within the wound bed. Wound #6 status is Open. Original cause of wound was Gradually Appeared. The date acquired was: 11/15/2021. The wound has been in treatment 1 weeks. The wound is located on the Left,Anterior Lower Leg. The wound measures 0.4cm length x 0.3cm width x 0.1cm depth; 0.094cm^2 area and 0.009cm^3 volume. There is Fat Layer (Subcutaneous Tissue) exposed. There is a none present amount of drainage noted. There is small (1-33%) red granulation within the wound bed. Wound #7 status is Healed - Epithelialized. Original cause of wound was Gradually Appeared. The date acquired was: 11/15/2021. The wound has been in treatment 1 weeks. The wound is located on the Left,Medial Lower Leg. The wound measures 0cm length x 0cm width x 0cm depth; 0cm^2 area and 0cm^3 volume. There is Fat Layer (Subcutaneous Tissue) exposed. There is a small amount of serous  drainage noted. There is small (1-33%) red granulation within the wound bed. There is no necrotic tissue  within the wound bed. Wound #8 status is Open. Original cause of wound was Gradually Appeared. The date acquired was: 12/15/2021. The wound is located on the Distal,Medial Back. The wound measures 8cm length x 1.5cm width x 0.2cm depth; 9.425cm^2 area and 1.885cm^3 volume. There is Fat Layer (Subcutaneous Tissue) exposed. There is a medium amount of serosanguineous drainage noted. There is small (1-33%) red, pink granulation within the wound bed. There is a small (1-33%) amount of necrotic tissue within the wound bed including Adherent Slough. [name] [name], [name] [name] ([id number]) [id number] Page 6 of 7 Assessment Active Problems ICD-10 Other specified disorders of the skin and subcutaneous tissue Non-pressure chronic ulcer of other part of left lower leg with fat layer exposed Non-pressure chronic ulcer of other part of right lower leg with fat layer exposed Pressure ulcer of other site, stage 3 Chronic obstructive pulmonary disease, unspecified Essential (primary) hypertension Anemia in other chronic diseases classified elsewhere Malignant neoplasm of mouth, unspecified Plan 1. I am going to recommend based on what we are seeing that we go ahead and see about initiating treatment with a Roho mosaic cushion which I think would definitely be appropriate and helpful for the patient. 2. I am also can recommend that we have the patient continue to monitor for any signs of infection or worsening right now I do not see any signs of infection but I definitely see some signs that the wound seems to be a little bit more irritated in regard to the back even compared to last week. I think we keep pressure off though this should do quite well for her. 3. I am also going to recommend that the patient should continue with the Xeroform gauze to the open wounds on lower extremities  and using the AandD ointment which is really helping her skin significantly. We will see patient back for reevaluation in 1 week here in the clinic. If anything worsens or changes patient will contact our office for additional recommendations. Electronic Signature([name]) Signed: 12/22/2021 4:57:24 PM By: [name] [name] Entered By: [name] Keeler on 12/22/2021 16:57:24 -------------------------------------------------------------------------------- SuperBill Details Patient Name: Date of Service: [name] [name] 12/22/2021 Medical Record Number: [id number] Patient Account Number: [id number] Date of Birth/Sex: Treating RN: November 03, 1925 (88 y.o. [name] [name] Primary Care Provider: Dewayne Shorter Other Clinician: Referring Provider: Treating Provider/Extender: [name] [name] Weeks in Treatment: 1 Diagnosis Coding ICD-10 Codes Code Description L98.8 Other specified disorders of the skin and subcutaneous tissue L97.822 Non-pressure chronic ulcer of other part of left lower leg with fat layer exposed L97.812 Non-pressure chronic ulcer of other part of right lower leg with fat layer exposed L89.893 Pressure ulcer of other site, stage 3 J44.9 Chronic obstructive pulmonary disease, unspecified I10 Essential (primary) hypertension D63.8 Anemia in other chronic diseases classified elsewhere C06.9 Malignant neoplasm of mouth, unspecified [name] [name], [name] [name] ([id number]) [id number] Page 7 of 7 Facility Procedures : CPT4 Code: 80881103 Description: 15945 - WOUND CARE VISIT-LEV 4 EST PT Modifier: Quantity: 1 Physician Procedures : CPT4 Code Description Modifier [id number] - WC PHYS LEVEL 3 - EST PT ICD-10 Diagnosis Description L98.8 Other specified disorders of the skin and subcutaneous tissue L97.822 Non-pressure chronic ulcer of other part of left lower leg with fat layer  exposed L97.812 Non-pressure chronic ulcer of other part of right  lower leg with fat layer exposed L89.893 Pressure ulcer of other site, stage 3 Quantity: 1 Electronic Signature([name]) Signed: 12/22/2021 5:26:26 PM By: Rosalio Loud MSN RN CNS WTA Signed: 12/23/2021  12:47:22 PM By: [name] [name] Previous Signature: 12/22/2021 4:57:58 PM Version By: [name] [name] Entered By: Rosalio Loud on 12/22/2021 17:26:25
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
## 2021-12-29 ENCOUNTER — Ambulatory Visit: Payer: Medicare Other

## 2021-12-30 ENCOUNTER — Non-Acute Institutional Stay (SKILLED_NURSING_FACILITY): Payer: Medicare Other | Admitting: Student

## 2021-12-30 ENCOUNTER — Encounter: Payer: Self-pay | Admitting: Student

## 2021-12-30 ENCOUNTER — Encounter: Payer: Medicare Other | Admitting: Physician Assistant

## 2021-12-30 DIAGNOSIS — C060 Malignant neoplasm of cheek mucosa: Secondary | ICD-10-CM | POA: Diagnosis not present

## 2021-12-30 DIAGNOSIS — L97822 Non-pressure chronic ulcer of other part of left lower leg with fat layer exposed: Secondary | ICD-10-CM | POA: Diagnosis not present

## 2021-12-30 MED ORDER — OXYCODONE HCL 5 MG PO TABS: 5.0000 mg | ORAL_TABLET | Freq: Four times a day (QID) | ORAL | 0 refills | Status: DC | PRN

## 2021-12-30 MED ORDER — OXYCODONE HCL ER 10 MG PO T12A: 10.0000 mg | EXTENDED_RELEASE_TABLET | Freq: Every evening | ORAL | 0 refills | Status: DC
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
## 2021-12-30 NOTE — Progress Notes (Addendum)
[name] [name] ([id number]) [id number] Page 1 of 6 Visit Report for 12/30/2021 Chief Complaint Document Details Patient Name: Date of Service: [name] [name] 12/30/2021 10:15 A M Medical Record Number: [id number] Patient Account Number: [id number] Date of Birth/Sex: Treating RN: 12-02-1925 (88 y.o. [name] [name] Primary Care Provider: Dewayne [name] Other Clinician: Referring Provider: Treating Provider/Extender: [name] [name] in Treatment: 2 Information Obtained from: Patient Chief Complaint Bilateral LE Ulcers Electronic Signature([name]) Signed: 12/30/2021 10:24:14 AM By: [name] [name] Entered By: [name] Keeler on 12/30/2021 10:24:14 -------------------------------------------------------------------------------- HPI Details Patient Name: Date of Service: [name] [name] 12/30/2021 10:15 A M Medical Record Number: [id number] Patient Account Number: [id number] Date of Birth/Sex: Treating RN: 1925/02/12 (88 y.o. [name] [name] Primary Care Provider: Dewayne [name] Other Clinician: Referring Provider: Treating Provider/Extender: [name] [name], [name] [name] in Treatment: 2 History of Present Illness HPI Description: 12-13-2021 patient presents today for initial inspection here in our clinic as a referral from [name] [name] regarding issues that she has been having with her feet although it is really not just her feet but her legs as well as her hands and her back and her thighs as well. This appears to have been a reaction from the chemotherapy as best we can tell. With that being said fortunately she seems to be getting better for the most part across the board including her feet although she still has a significant wound on the right foot and the feet in general still have a lot of very dry skin remaining at this point. Fortunately there does not appear to be any signs of infection locally nor systemically which  is great news. She is seen with her son-in-law here in the clinic today. Patient does have a history of COPD, hypertension, anemia of chronic disease, and she does have mouth cancer as well that is what she has been undergoing chemotherapy and radiation for. 12-22-2021 upon evaluation today patient appears to be doing well currently in regard to her wounds. In fact a lot of them are completely healed as of today. She also has a new wound opened up on her back unfortunately this appears to be more of a pressure injury at this point. I am concerned more about that but I am anything else to be perfectly honest. 12-30-2021 upon evaluation today patient appears to be doing well currently in regard to her wounds. The only thing that is left open at this point is the top of her right foot which is significantly smaller and her back also significantly smaller and looking much better. In general I am actually very pleased with where we stand today. [name], [name] ([id number]) [id number] Page 2 of 6 Electronic Signature([name]) Signed: 12/30/2021 10:49:35 AM By: [name] [name] Entered By: [name] Keeler on 12/30/2021 10:49:35 -------------------------------------------------------------------------------- Physical Exam Details Patient Name: Date of Service: [name], [name] 12/30/2021 10:15 A M Medical Record Number: [id number] Patient Account Number: [id number] Date of Birth/Sex: Treating RN: 21-Dec-1925 (88 y.o. [name] [name] Primary Care Provider: Dewayne [name] Other Clinician: Referring Provider: Treating Provider/Extender: [name] [name] in Treatment: 2 Constitutional Well-nourished and well-hydrated in no acute distress. Respiratory normal breathing without difficulty. Psychiatric this patient is able to make decisions and demonstrates good insight into disease process. Alert and Oriented x 3. pleasant and cooperative. Notes Upon  inspection patient'[name] wound bed actually showed signs of good granulation epithelization at this point. Fortunately there does not appear to be any evidence  of active infection locally nor systemically which is great news and overall I am extremely pleased with where we stand. I do believe that we are headed in the right direction here. Electronic Signature([name]) Signed: 12/30/2021 10:49:52 AM By: [name] [name] Entered By: [name] Keeler on 12/30/2021 10:49:52 -------------------------------------------------------------------------------- Physician Orders Details Patient Name: Date of Service: [name] [name] 12/30/2021 10:15 A M Medical Record Number: [id number] Patient Account Number: [id number] Date of Birth/Sex: Treating RN: September 07, 1925 (88 y.o. [name] [name] Primary Care Provider: Dewayne [name] Other Clinician: Referring Provider: Treating Provider/Extender: [name] [name] in Treatment: 2 Verbal / Phone Orders: No Diagnosis Coding ICD-10 Coding Code Description BRYNLEIGH, SEQUEIRA ([id number]) [id number] Page 3 of 6 L98.8 Other specified disorders of the skin and subcutaneous tissue L97.822 Non-pressure chronic ulcer of other part of left lower leg with fat layer exposed L97.812 Non-pressure chronic ulcer of other part of right lower leg with fat layer exposed L89.893 Pressure ulcer of other site, stage 3 J44.9 Chronic obstructive pulmonary disease, unspecified I10 Essential (primary) hypertension D63.8 Anemia in other chronic diseases classified elsewhere C06.9 Malignant neoplasm of mouth, unspecified Follow-up Appointments Return Appointment in 1 week. Bathing/ Shower/ Hygiene Clean wound with Normal Saline or wound cleanser. Anesthetic (Use 'Patient Medications' Section for Anesthetic Order Entry) Wound #1 Right,Distal,Dorsal,Anterior Foot Lidocaine applied to wound bed Wound Treatment Wound #1 - Foot Wound  Laterality: Dorsal, Right, Anterior, Distal Cleanser: Normal Saline 1 x Per Day/30 Days Discharge Instructions: Wash your hands with soap and water. Remove old dressing, discard into plastic bag and place into trash. Cleanse the wound with Normal Saline prior to applying a clean dressing using gauze sponges, not tissues or cotton balls. Do not scrub or use excessive force. Pat dry using gauze sponges, not tissue or cotton balls. Prim Dressing: Xeroform 4x4-HBD (in/in) 1 x Per Day/30 Days ary Discharge Instructions: Apply Xeroform 4x4-HBD (in/in) as directed Secondary Dressing: ABD Pad 5x9 (in/in) 1 x Per Day/30 Days Discharge Instructions: Cover with ABD pad Secured With: Kerlix Roll Sterile or Non-Sterile 6-ply 4.5x4 (yd/yd) 1 x Per Day/30 Days Discharge Instructions: Apply Kerlix as directed Electronic Signature([name]) Signed: 12/30/2021 12:51:07 PM By: Rosalio Loud MSN RN CNS WTA Signed: 12/30/2021 1:07:55 PM By: [name] [name] Entered By: Rosalio Loud on 12/30/2021 10:54:22 -------------------------------------------------------------------------------- Problem List Details Patient Name: Date of Service: [name] [name] 12/30/2021 10:15 A M Medical Record Number: [id number] Patient Account Number: [id number] Date of Birth/Sex: Treating RN: 1926-01-03 (88 y.o. [name] [name] Primary Care Provider: Dewayne [name] Other Clinician: Referring Provider: Treating Provider/Extender: [name] [name] in Treatment: 2 Active Problems ICD-10 Encounter Code Description Active Date MDM Diagnosis L98.8 Other specified disorders of the skin and subcutaneous tissue 12/13/2021 No Yes [name] [name] [name] [name] ([id number]) [id number] Page 4 of 6 [phone] Non-pressure chronic ulcer of other part of left lower leg with fat layer exposed12/05/2021 No Yes L97.812 Non-pressure chronic ulcer of other part of right lower leg with fat layer 12/13/2021 No  Yes exposed L89.893 Pressure ulcer of other site, stage 3 12/22/2021 No Yes J44.9 Chronic obstructive pulmonary disease, unspecified 12/13/2021 No Yes I10 Essential (primary) hypertension 12/13/2021 No Yes D63.8 Anemia in other chronic diseases classified elsewhere 12/13/2021 No Yes C06.9 Malignant neoplasm of mouth, unspecified 12/13/2021 No Yes Inactive Problems Resolved Problems Electronic Signature([name]) Signed: 12/30/2021 12:51:07 PM By: Rosalio Loud MSN RN CNS WTA Signed: 12/30/2021 1:07:55 PM By: [name] [name] Previous Signature: 12/30/2021 10:24:11 AM Version By: Joaquim Lai  III, Colten Desroches [name] Entered By: Rosalio Loud on 12/30/2021 10:55:58 -------------------------------------------------------------------------------- Progress Note Details Patient Name: Date of Service: [name], [name] 12/30/2021 10:15 A M Medical Record Number: [id number] Patient Account Number: [id number] Date of Birth/Sex: Treating RN: Apr 24, 1925 (88 y.o. [name] [name] Primary Care Provider: Dewayne [name] Other Clinician: Referring Provider: Treating Provider/Extender: [name] [name] in Treatment: 2 Subjective Chief Complaint Information obtained from Patient Bilateral LE Ulcers History of Present Illness (HPI) 12-13-2021 patient presents today for initial inspection here in our clinic as a referral from [facility] regarding issues that she has been having with her feet although it is really not just her feet but her legs as well as her hands and her back and her thighs as well. This appears to have been a reaction from the chemotherapy as best we can tell. With that being said fortunately she seems to be getting better for the most part across the board including her feet although she still has a significant wound on the right foot and the feet in general still have a lot of very dry skin remaining at this point. Fortunately there does not appear to be any signs of infection locally nor  systemically which is great news. She is seen with her son-in-law here in the clinic today. [name], [name] ([id number]) [id number] Page 5 of 6 Patient does have a history of COPD, hypertension, anemia of chronic disease, and she does have mouth cancer as well that is what she has been undergoing chemotherapy and radiation for. 12-22-2021 upon evaluation today patient appears to be doing well currently in regard to her wounds. In fact a lot of them are completely healed as of today. She also has a new wound opened up on her back unfortunately this appears to be more of a pressure injury at this point. I am concerned more about that but I am anything else to be perfectly honest. 12-30-2021 upon evaluation today patient appears to be doing well currently in regard to her wounds. The only thing that is left open at this point is the top of her right foot which is significantly smaller and her back also significantly smaller and looking much better. In general I am actually very pleased with where we stand today. Objective Constitutional Well-nourished and well-hydrated in no acute distress. Vitals Time Taken: 10:14 AM, Height: 68 in, Weight: 150 lbs, BMI: 22.8, Temperature: 98.1 F, Pulse: 80 bpm, Respiratory Rate: 18 breaths/min, Blood Pressure: 152/72 mmHg. Respiratory normal breathing without difficulty. Psychiatric this patient is able to make decisions and demonstrates good insight into disease process. Alert and Oriented x 3. pleasant and cooperative. General Notes: Upon inspection patient'[name] wound bed actually showed signs of good granulation epithelization at this point. Fortunately there does not appear to be any evidence of active infection locally nor systemically which is great news and overall I am extremely pleased with where we stand. I do believe that we are headed in the right direction here. Integumentary (Hair, Skin) Wound #1 status is Open.  Original cause of wound was Gradually Appeared. The date acquired was: 11/15/2021. The wound has been in treatment 2 [name]. The wound is located on the [place]. The wound measures 1.7cm length x 4.3cm width x 0.1cm depth; 5.741cm^2 area and 0.574cm^3 volume. There is Fat Layer (Subcutaneous Tissue) exposed. There is a medium amount of serosanguineous drainage noted. There is small (1-33%) red, pink, friable granulation within the wound bed. There is a small (1-33%) amount of  necrotic tissue within the wound bed including Adherent Slough. Wound #6 status is Healed - Epithelialized. Original cause of wound was Gradually Appeared. The date acquired was: 11/15/2021. The wound has been in treatment 2 [name]. The wound is located on the Left,Anterior Lower Leg. The wound measures 0cm length x 0cm width x 0cm depth; 0cm^2 area and 0cm^3 volume. There is Fat Layer (Subcutaneous Tissue) exposed. There is a none present amount of drainage noted. There is small (1-33%) red granulation within the wound bed. Wound #8 status is Open. Original cause of wound was Gradually Appeared. The date acquired was: 12/15/2021. The wound has been in treatment 1 [name]. The wound is located on the Distal,Medial Back. The wound measures 6.7cm length x 0.9cm width x 0.2cm depth; 4.736cm^2 area and 0.947cm^3 volume. There is a medium amount of serosanguineous drainage noted. 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 I am going to suggest that we have the patient  continue to monitor for any signs of infection or worsening. Based on what I am seeing I do believe that we are on the right track but I do believe we still have quite a ways to go as well as far as keeping things moving in the right direction. 2. Regarding continue with the Xeroform gauze dressing which I think is doing a good job here. Patient is in agreement with that plan. 3. I am also can recommend that she should continue with a Roho cushion she just got it yesterday and to be honest I think this is going to do a great job at helping to keep things moving in the right direction. We will see patient back for reevaluation in 1 week here in the clinic. If anything worsens or changes patient will contact our office for additional recommendations. [name], [name] ([id number]) [id number] Page 6 of 6 Electronic Signature([name]) Signed: 12/30/2021 10:53:33 AM By: [name] [name] Entered By: [name] Keeler on 12/30/2021 10:53:33 -------------------------------------------------------------------------------- SuperBill Details Patient Name: Date of Service: [name] [name] 12/30/2021 Medical Record Number: [id number] Patient Account Number: [id number] Date of Birth/Sex: Treating RN: Jul 14, 1925 (88 y.o. [name] [name] Primary Care Provider: Dewayne [name] Other Clinician: Referring Provider: Treating Provider/Extender: [name] [name] in Treatment: 2 Diagnosis Coding ICD-10 Codes Code Description L98.8 Other specified disorders of the skin and subcutaneous tissue L97.822 Non-pressure chronic ulcer of other part of left lower leg with fat layer exposed L97.812 Non-pressure chronic ulcer of other part of right lower leg with fat layer exposed L89.893 Pressure ulcer of other site, stage 3 J44.9 Chronic obstructive pulmonary disease, unspecified I10 Essential (primary) hypertension D63.8 Anemia in other chronic diseases classified  elsewhere C06.9 Malignant neoplasm of mouth, unspecified Facility Procedures : CPT4 Code: [id number] Description: 99213 - WOUND CARE VISIT-LEV 3 EST PT Modifier: Quantity: 1 Physician Procedures : CPT4 Code Description Modifier [id number] - WC PHYS LEVEL 3 - EST PT ICD-10 Diagnosis Description L98.8 Other specified disorders of the skin and subcutaneous tissue L97.822 Non-pressure chronic ulcer of other part of left lower leg with fat layer  exposed L89.893 Pressure ulcer of other site, stage 3 J44.9  Chronic obstructive pulmonary disease, unspecified Quantity: 1 Electronic Signature([name]) Signed: 12/30/2021 11:00:58 AM By: [name] [name] Entered By: [name] Keeler on 12/30/2021 11:00:58

## 2021-12-30 NOTE — Progress Notes (Unsigned)
Location:  [facility] Room Number: [place] [address] Place of Service:  SNF [id number]) Provider:  Dr. Amada Kingfisher, MD  Patient Care Team: Dewayne Shorter, MD as PCP - General (Family Medicine) Sindy Guadeloupe, MD as Consulting Physician (Hematology and Oncology)  Extended Emergency Contact Information Primary Emergency Contact: [facility] Address: [address]          [place], [place] [address] Johnnette Litter of [place] Phone: [phone] Mobile Phone: [phone] Relation: Relative Secondary Emergency Contact: Curro,[name] Address: [address]          [place], [place] [address] Johnnette Litter of [place] Phone: [phone] Mobile Phone: [phone] Relation: Son  Code Status:  DNR Goals of care: Advanced Directive information    12/30/2021    3:01 PM  Advanced Directives  Does Patient Have a Medical Advance Directive? Yes  Type of Paramedic of [place];Living will;Out of facility DNR (pink MOST or yellow form)  Does patient want to make changes to medical advance directive? No - Patient declined  Copy of [place] in Chart? Yes - validated most recent copy scanned in chart (See row information)     Chief Complaint  Patient presents with   Acute Visit    Itching.     HPI:  Pt is a 88 y.o. female seen today for an acute visit for symptoms of withdrawal in the setting of sudden discontinuation of her pain medication. Last night she had intense itching and her pain has been difficult to manage. She does not want to be addicted, but needs help with working on discontinuation.    Past Medical History:  Diagnosis Date   Actinic keratosis    Anemia    COPD (chronic obstructive pulmonary disease) (HCC)    Diverticulitis    GERD (gastroesophageal reflux disease)    Glaucoma    HLD (hyperlipidemia)    Hypertension    Iron deficiency    Rectal bleeding    Skin  cancer    Nose, txted by Dr. Valere Dross at [facility]   Past Surgical History:  Procedure Laterality Date   ABDOMINAL HYSTERECTOMY     APPENDECTOMY     COLONOSCOPY WITH PROPOFOL N/A 04/14/2016   Procedure: COLONOSCOPY WITH PROPOFOL;  Surgeon: Lucilla Lame, MD;  Location: [facility] ENDOSCOPY;  Service: Endoscopy;  Laterality: N/A;   ENTEROSCOPY N/A 05/10/2016   Procedure: Push ENTEROSCOPY with pediatric colonoscope;  Surgeon: [name], MD;  Location: [facility] ENDOSCOPY;  Service: Endoscopy;  Laterality: N/A;   ENTEROSCOPY N/A 05/30/2016   Procedure: push ENTEROSCOPY;  Surgeon: [name], MD;  Location: [facility] ENDOSCOPY;  Service: Endoscopy;  Laterality: N/A;   ENTEROSCOPY N/A 07/05/2017   Procedure: ENTEROSCOPY;  Surgeon: [name], MD;  Location: [facility] ENDOSCOPY;  Service: Gastroenterology;  Laterality: N/A;   ENTEROSCOPY N/A 06/21/2018   Procedure: ENTEROSCOPY;  Surgeon: [name], MD;  Location: [facility] ENDOSCOPY;  Service: Gastroenterology;  Laterality: N/A;   ESOPHAGOGASTRODUODENOSCOPY (EGD) WITH PROPOFOL N/A 08/12/2015   Procedure: ESOPHAGOGASTRODUODENOSCOPY (EGD) WITH PROPOFOL;  Surgeon: Lollie Sails, MD;  Location: [facility] ENDOSCOPY;  Service: Endoscopy;  Laterality: N/A;   ESOPHAGOGASTRODUODENOSCOPY (EGD) WITH PROPOFOL N/A 04/03/2016   Procedure: ESOPHAGOGASTRODUODENOSCOPY (EGD) WITH PROPOFOL;  Surgeon: Lucilla Lame, MD;  Location: [facility] ENDOSCOPY;  Service: Endoscopy;  Laterality: N/A;   GIVENS CAPSULE STUDY N/A 04/28/2016   Procedure: GIVENS CAPSULE STUDY;  Surgeon: [name], MD;  Location: [facility] ENDOSCOPY;  Service: Endoscopy;  Laterality: N/A;   GIVENS  CAPSULE STUDY N/A 06/21/2017   Procedure: GIVENS CAPSULE STUDY;  Surgeon: [name], MD;  Location: [facility] ENDOSCOPY;  Service: Gastroenterology;  Laterality: N/A;   JOINT REPLACEMENT      Allergies  Allergen Reactions   Nsaids Hives   Dextrans Hives   Fentanyl Itching   Lipitor [Atorvastatin] Other (See Comments)    Reaction: Muscle pain    Lovastatin    Mobic [Meloxicam] Other (See Comments)    Reaction: Mouth and tongue ulcers   Niacin And Related Itching   Other    Polysaccharide K Hives   Pravachol [Pravastatin Sodium] Other (See Comments)    Reaction: Muscle pain   Statins Other (See Comments)   Tolmetin Hives   Voltaren [Diclofenac Sodium] Other (See Comments)    Reaction: Mouth and tongue ulcers   Zetia [Ezetimibe] Other (See Comments)    Reaction: Leg pain   Codeine Nausea And Vomiting   Niacin Itching    Outpatient Encounter Medications as of 12/30/2021  Medication Sig   acetaminophen (TYLENOL) 500 MG tablet Take 500-1,000 mg by mouth every 6 (six) hours as needed for mild pain, moderate pain or fever.   aluminum-magnesium hydroxide 200-200 MG/5ML suspension Take 30 mLs by mouth every 4 (four) hours as needed (For mouth ulcers). Mix with Lidocaine viscous 2 %   bisacodyl (DULCOLAX) 5 MG EC tablet Take 1 tablet (5 mg total) by mouth daily as needed for moderate constipation.   Cyanocobalamin (B-12) 1000 MCG CAPS Take 2,000 Units by mouth daily.    DULoxetine (CYMBALTA) 30 MG capsule Take 1 capsule (30 mg total) by mouth daily.   fluticasone (FLONASE) 50 MCG/ACT nasal spray Place 1 spray into both nostrils daily.   gabapentin (NEURONTIN) 300 MG capsule Take 1 capsule (300 mg total) by mouth 3 (three) times daily.   lactose free nutrition (BOOST) LIQD Take 237 mLs by mouth daily.   latanoprost (XALATAN) 0.005 % ophthalmic solution Place 1 drop into both eyes at bedtime.   lidocaine (XYLOCAINE) 2 % solution Use as directed in the mouth or throat. Mix with Maalox 30 mL and allow patient to apply to affected areas in mouth every 4 hours as needed for mouth ulcers   lidocaine-prilocaine (EMLA) cream Apply topically every 4 (four) hours as needed (skin pain).   losartan (COZAAR) 50 MG tablet Take 50 mg by mouth daily.   methocarbamol (ROBAXIN) 500 MG tablet Take 0.5 tablets (250 mg total) by mouth 2 (two) times daily.    mometasone (ELOCON) 0.1 % lotion Apply 1 application topically daily as needed (skin irritation).   Multiple Vitamins-Minerals (PRESERVISION AREDS 2) CAPS Take 1 capsule by mouth 2 (two) times daily.   omeprazole (PRILOSEC) 20 MG capsule Take 20 mg by mouth in the morning and at bedtime.   oxyCODONE (OXY IR/ROXICODONE) 5 MG immediate release tablet Take 5 mg by mouth every 4 (four) hours as needed for severe pain. For breakthrough pain for 30 days.   polyethylene glycol (MIRALAX / GLYCOLAX) 17 g packet Take 17 g by mouth daily.   Saccharomyces boulardii (PROBIOTIC) 250 MG CAPS Take 1 capsule by mouth 2 (two) times daily.   senna (SENOKOT) 8.6 MG tablet Take 2 tablets by mouth daily.   timolol (TIMOPTIC) 0.5 % ophthalmic solution Place 1 drop into both eyes daily.   [DISCONTINUED] oxyCODONE (OXY IR/ROXICODONE) 5 MG immediate release tablet Give one tablet by mouth every 4 hours as needed for breakthrough pain for 30 days.   [DISCONTINUED] oxyCODONE (OXY  IR/ROXICODONE) 5 MG immediate release tablet Take 1 tablet (5 mg total) by mouth every 6 (six) hours as needed for severe pain. Give one tablet by mouth every 4 hours as needed for breakthrough pain for 30 days. (Patient taking differently: Give one tablet by mouth every 4 hours as needed for breakthrough pain for 30 days.)   [DISCONTINUED] oxyCODONE (OXYCONTIN) 10 mg 12 hr tablet Take 5 mg by mouth at bedtime for 1 week start date 12/30/21   [DISCONTINUED] oxyCODONE (OXYCONTIN) 10 mg 12 hr tablet Take 1 tablet (10 mg total) by mouth at bedtime. Take 5 mg by mouth at bedtime for 1 week start date 12/30/21, then every other day for 7 days.   [DISCONTINUED] oxyCODONE (OXYCONTIN) 10 mg 12 hr tablet Take 1 tablet (10 mg total) by mouth every 12 (twelve) hours.   [DISCONTINUED] silver sulfADIAZINE (SILVADENE) 1 % cream Apply topically 2 (two) times daily. To skin wounds on feet   Facility-Administered Encounter Medications as of 12/30/2021  Medication    0.9 %  sodium chloride infusion    Review of Systems  Immunization History  Administered Date(s) Administered   Fluad Quad(high Dose 65+) 11/07/2019   Influenza, High Dose Seasonal PF 10/19/2015, 11/06/2016   Influenza,inj,Quad PF,6+ Mos 11/12/2013, 03/04/2015   Influenza-Unspecified 09/22/2009, 11/25/2010, 10/10/2011   PFIZER Comirnaty(Gray Top)Covid-19 Tri-Sucrose Vaccine 02/10/2019, 03/03/2019   Pneumococcal Conjugate-13 03/04/2015, 09/19/2018   Pneumococcal Polysaccharide-23 09/30/2001   Td 01/27/2009   Pertinent  Health Maintenance Due  Topic Date Due   DEXA SCAN  Never done   INFLUENZA VACCINE  08/09/2021      11/27/2021   11:00 AM 11/27/2021    8:26 PM 11/28/2021   10:00 AM 11/28/2021   10:00 PM 12/21/2021    1:08 PM  Fall Risk  Patient Fall Risk Level High fall risk High fall risk High fall risk High fall risk High fall risk   Functional Status Survey:    Vitals:   12/30/21 1401  BP: (!) 168/89  Pulse: (!) 106  Resp: 20  Temp: (!) 97.5 F (36.4 C)  SpO2: 97%  Weight: 145 lb 12.8 oz (66.1 kg)  Height: '5\' 7"'$  (1.702 m)   Body mass index is 22.84 kg/m. Physical Exam Constitutional:      Appearance: Normal appearance.  Cardiovascular:     Rate and Rhythm: Normal rate and regular rhythm.     Pulses: Normal pulses.     Heart sounds: Normal heart sounds.  Abdominal:     General: Abdomen is flat. Bowel sounds are normal.     Palpations: Abdomen is soft.  Skin:    General: Skin is warm.     Comments: Left foot with healthy, normal skin. Right foot with bandage in place. No rash of the upper back.   Neurological:     Mental Status: She is alert and oriented to person, place, and time.     Labs reviewed: Recent Labs    11/25/21 0632 11/26/21 0412 11/27/21 0556 11/29/21 0632  NA 132* 134*  --  135  K 3.7 4.1  --  3.9  CL 102 104  --  104  CO2 25 23  --  28  GLUCOSE 89 84  --  90  BUN 19 11  --  8  CREATININE 0.44 0.36* 0.41* 0.38*  CALCIUM  8.5* 8.7*  --  8.6*   Recent Labs    06/22/21 1251 11/24/21 1633 11/25/21 0632  AST 20 19 14*  ALT 11  9 9  ALKPHOS 53 61 51  BILITOT 0.4 0.6 0.7  PROT 7.3 7.2 5.9*  ALBUMIN 3.8 3.0* 2.4*   Recent Labs    09/22/21 1112 11/24/21 1633 11/25/21 0632 11/26/21 0412 11/29/21 0632 12/21/21 1238  WBC 12.2* 16.2*   < > 7.7 6.0 9.8  NEUTROABS 9.8* 13.2*  --   --   --  7.6  HGB 11.7* 10.1*   < > 8.6* 9.0* 10.0*  HCT 35.4* 30.9*   < > 25.9* 27.2* 31.2*  MCV 83.5 80.5   < > 80.2 80.2 80.8  PLT 402* 448*   < > 394 443* 472*   < > = values in this interval not displayed.   No results found for: "TSH" No results found for: "HGBA1C" No results found for: "CHOL", "HDL", "LDLCALC", "LDLDIRECT", "TRIG", "CHOLHDL"  Significant Diagnostic Results in last 30 days:  No results found.  Assessment/Plan 1. 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 Hopes to discontinue. Will plan on scheduling oxycontin 10 mg nightly for 1week, then every other night, then discontinue. Oxycodone '5mg'$  q6 hrs prn for breakthrough pain or symptom management. Will continue after discontinuing oxycontin to prevent symptom development.    Family/ staff Communication: nursing, patient, son Neurosurgeon message).   Labs/tests ordered:  none

## 2022-01-03 ENCOUNTER — Encounter: Payer: Self-pay | Admitting: Adult Health

## 2022-01-03 ENCOUNTER — Non-Acute Institutional Stay (SKILLED_NURSING_FACILITY): Payer: Medicare Other | Admitting: Adult Health

## 2022-01-03 ENCOUNTER — Other Ambulatory Visit: Payer: Self-pay | Admitting: Adult Health

## 2022-01-03 DIAGNOSIS — G893 Neoplasm related pain (acute) (chronic): Secondary | ICD-10-CM

## 2022-01-03 DIAGNOSIS — L299 Pruritus, unspecified: Secondary | ICD-10-CM

## 2022-01-03 MED ORDER — OXYCODONE HCL 5 MG PO TABS: 5.0000 mg | ORAL_TABLET | Freq: Four times a day (QID) | ORAL | 0 refills | Status: DC | PRN

## 2022-01-03 NOTE — Progress Notes (Signed)
Location:   [facility] Room Number: [place] of Service:  SNF ([id number]) Provider:  Durenda Age, NP  Dewayne Shorter, MD  Patient Care Team: Dewayne Shorter, MD as PCP - General (Family Medicine) Sindy Guadeloupe, MD as Consulting Physician (Hematology and Oncology)  Extended Emergency Contact Information Primary Emergency Contact: [facility] Address: [address],          [place], [place] [address] Johnnette Litter of [place] Phone: [phone] Mobile Phone: [phone] Relation: Relative Secondary Emergency Contact: Mendia,[name] Address: [address]          [place], [place] [address] Johnnette Litter of [place] Phone: [phone] Mobile Phone: [phone] Relation: Son  Code Status:  DNR Goals of care: Advanced Directive information    01/03/2022   10:37 AM  Advanced Directives  Does Patient Have a Medical Advance Directive? Yes  Type of Paramedic of [place];Living will;Out of facility DNR (pink MOST or yellow form)  Does patient want to make changes to medical advance directive? No - Patient declined  Copy of [place] in Chart? Yes - validated most recent copy scanned in chart (See row information)     Chief Complaint  Patient presents with   Acute Visit    Itching    HPI: She complains of itching on her back at night. No noted rashes on back. She has all rhinitis for which she takes Fluticasone. She has chronic pain and takes Oxycontin 10 mg 12 HR Q 12 hours, Oxycodone 5 mg Q 4 hours PRN, Cymbalta 30 mg daily and Robaxin 250 mg BID.    Past Medical History:  Diagnosis Date   Actinic keratosis    Anemia    COPD (chronic obstructive pulmonary disease) (HCC)    Diverticulitis    GERD (gastroesophageal reflux disease)    Glaucoma    HLD (hyperlipidemia)    Hypertension    Iron deficiency    Rectal bleeding    Skin cancer    Nose, txted by Dr.  Valere Dross at [facility]   Past Surgical History:  Procedure Laterality Date   ABDOMINAL HYSTERECTOMY     APPENDECTOMY     COLONOSCOPY WITH PROPOFOL N/A 04/14/2016   Procedure: COLONOSCOPY WITH PROPOFOL;  Surgeon: Lucilla Lame, MD;  Location: [facility] ENDOSCOPY;  Service: Endoscopy;  Laterality: N/A;   ENTEROSCOPY N/A 05/10/2016   Procedure: Push ENTEROSCOPY with pediatric colonoscope;  Surgeon: [name], MD;  Location: [facility] ENDOSCOPY;  Service: Endoscopy;  Laterality: N/A;   ENTEROSCOPY N/A 05/30/2016   Procedure: push ENTEROSCOPY;  Surgeon: [name], MD;  Location: [facility] ENDOSCOPY;  Service: Endoscopy;  Laterality: N/A;   ENTEROSCOPY N/A 07/05/2017   Procedure: ENTEROSCOPY;  Surgeon: [name], MD;  Location: [facility] ENDOSCOPY;  Service: Gastroenterology;  Laterality: N/A;   ENTEROSCOPY N/A 06/21/2018   Procedure: ENTEROSCOPY;  Surgeon: [name], MD;  Location: [facility] ENDOSCOPY;  Service: Gastroenterology;  Laterality: N/A;   ESOPHAGOGASTRODUODENOSCOPY (EGD) WITH PROPOFOL N/A 08/12/2015   Procedure: ESOPHAGOGASTRODUODENOSCOPY (EGD) WITH PROPOFOL;  Surgeon: Lollie Sails, MD;  Location: [facility] ENDOSCOPY;  Service: Endoscopy;  Laterality: N/A;   ESOPHAGOGASTRODUODENOSCOPY (EGD) WITH PROPOFOL N/A 04/03/2016   Procedure: ESOPHAGOGASTRODUODENOSCOPY (EGD) WITH PROPOFOL;  Surgeon: Lucilla Lame, MD;  Location: [facility] ENDOSCOPY;  Service: Endoscopy;  Laterality: N/A;   GIVENS CAPSULE STUDY N/A 04/28/2016   Procedure: GIVENS CAPSULE STUDY;  Surgeon: [name], MD;  Location: [facility] ENDOSCOPY;  Service: Endoscopy;  Laterality: N/A;   GIVENS CAPSULE STUDY N/A  06/21/2017   Procedure: GIVENS CAPSULE STUDY;  Surgeon: [name], MD;  Location: [facility] ENDOSCOPY;  Service: Gastroenterology;  Laterality: N/A;   JOINT REPLACEMENT      Allergies  Allergen Reactions   Nsaids Hives   Dextrans Hives   Fentanyl Itching   Lipitor [Atorvastatin] Other (See Comments)    Reaction: Muscle pain   Lovastatin    Mobic [Meloxicam] Other  (See Comments)    Reaction: Mouth and tongue ulcers   Niacin And Related Itching   Other    Polysaccharide K Hives   Pravachol [Pravastatin Sodium] Other (See Comments)    Reaction: Muscle pain   Statins Other (See Comments)   Tolmetin Hives   Voltaren [Diclofenac Sodium] Other (See Comments)    Reaction: Mouth and tongue ulcers   Zetia [Ezetimibe] Other (See Comments)    Reaction: Leg pain   Codeine Nausea And Vomiting   Niacin Itching    Allergies as of 01/03/2022       Reactions   Nsaids Hives   Dextrans Hives   Fentanyl Itching   Lipitor [atorvastatin] Other (See Comments)   Reaction: Muscle pain   Lovastatin    Mobic [meloxicam] Other (See Comments)   Reaction: Mouth and tongue ulcers   Niacin And Related Itching   Other    Polysaccharide K Hives   Pravachol [pravastatin Sodium] Other (See Comments)   Reaction: Muscle pain   Statins Other (See Comments)   Tolmetin Hives   Voltaren [diclofenac Sodium] Other (See Comments)   Reaction: Mouth and tongue ulcers   Zetia [ezetimibe] Other (See Comments)   Reaction: Leg pain   Codeine Nausea And Vomiting   Niacin Itching        Medication List        Accurate as of January 03, 2022 11:59 PM. If you have any questions, ask your nurse or doctor.          acetaminophen 500 MG tablet Commonly known as: TYLENOL Take 500-1,000 mg by mouth every 6 (six) hours as needed for mild pain, moderate pain or fever.   aluminum-magnesium hydroxide 200-200 MG/5ML suspension Take 30 mLs by mouth every 4 (four) hours as needed (For mouth ulcers). Mix with Lidocaine viscous 2 %   B-12 1000 MCG Caps Take 2,000 Units by mouth daily.   bisacodyl 5 MG EC tablet Commonly known as: DULCOLAX Take 1 tablet (5 mg total) by mouth daily as needed for moderate constipation.   DULoxetine 30 MG capsule Commonly known as: Cymbalta Take 1 capsule (30 mg total) by mouth daily.   fluticasone 50 MCG/ACT nasal spray Commonly known as:  FLONASE Place 1 spray into both nostrils daily.   gabapentin 300 MG capsule Commonly known as: NEURONTIN Take 1 capsule (300 mg total) by mouth 3 (three) times daily.   lactose free nutrition Liqd Take 237 mLs by mouth daily.   latanoprost 0.005 % ophthalmic solution Commonly known as: XALATAN Place 1 drop into both eyes at bedtime.   lidocaine 2 % solution Commonly known as: XYLOCAINE Use as directed in the mouth or throat. Mix with Maalox 30 mL and allow patient to apply to affected areas in mouth every 4 hours as needed for mouth ulcers   lidocaine-prilocaine cream Commonly known as: EMLA Apply topically every 4 (four) hours as needed (skin pain).   loratadine 10 MG tablet Commonly known as: CLARITIN Take 1 tablet (10 mg total) by mouth at bedtime. Started by: Durenda Age, NP   losartan  50 MG tablet Commonly known as: COZAAR Take 50 mg by mouth daily.   methocarbamol 500 MG tablet Commonly known as: ROBAXIN Take 0.5 tablets (250 mg total) by mouth 2 (two) times daily.   mometasone 0.1 % lotion Commonly known as: ELOCON Apply 1 application topically daily as needed (skin irritation).   omeprazole 20 MG capsule Commonly known as: PRILOSEC Take 20 mg by mouth in the morning and at bedtime.   OXYCODONE HCL PO Take 5 mg by mouth every 4 (four) hours as needed. What changed: Another medication with the same name was removed. Continue taking this medication, and follow the directions you see here. Changed by: Durenda Age, NP   oxyCODONE 10 mg 12 hr tablet Commonly known as: OXYCONTIN Take 10 mg by mouth at bedtime. At bedtime for 1 week What changed: Another medication with the same name was removed. Continue taking this medication, and follow the directions you see here. Changed by: Durenda Age, NP   polyethylene glycol 17 g packet Commonly known as: MIRALAX / GLYCOLAX Take 17 g by mouth daily.   PreserVision AREDS 2 Caps Take 1  capsule by mouth 2 (two) times daily.   Probiotic 250 MG Caps Take 1 capsule by mouth 2 (two) times daily.   senna 8.6 MG tablet Commonly known as: SENOKOT Take 2 tablets by mouth daily.   timolol 0.5 % ophthalmic solution Commonly known as: TIMOPTIC Place 1 drop into both eyes daily.        Review of Systems  Constitutional:  Negative for appetite change, chills, fatigue and fever.  HENT:  Negative for congestion, hearing loss, rhinorrhea and sore throat.   Eyes: Negative.   Respiratory:  Negative for cough, shortness of breath and wheezing.   Cardiovascular:  Negative for chest pain, palpitations and leg swelling.  Gastrointestinal:  Negative for abdominal pain, constipation, diarrhea, nausea and vomiting.  Genitourinary:  Negative for dysuria.  Musculoskeletal:  Negative for arthralgias, back pain and myalgias.  Skin:  Negative for color change, rash and wound.       Itching on her back at night  Neurological:  Negative for dizziness, weakness and headaches.  Psychiatric/Behavioral:  Negative for behavioral problems. The patient is not nervous/anxious.     Immunization History  Administered Date(s) Administered   Fluad Quad(high Dose 65+) 11/07/2019   Influenza, High Dose Seasonal PF 10/19/2015, 11/06/2016   Influenza,inj,Quad PF,6+ Mos 11/12/2013, 03/04/2015   Influenza-Unspecified 09/22/2009, 11/25/2010, 10/10/2011   PFIZER Comirnaty(Gray Top)Covid-19 Tri-Sucrose Vaccine 02/10/2019, 03/03/2019   Pneumococcal Conjugate-13 03/04/2015, 09/19/2018   Pneumococcal Polysaccharide-23 09/30/2001   Td 01/27/2009   Unspecified SARS-COV-2 Vaccination 01/24/2019, 02/21/2019   Pertinent  Health Maintenance Due  Topic Date Due   DEXA SCAN  Never done   INFLUENZA VACCINE  08/09/2021      11/27/2021   11:00 AM 11/27/2021    8:26 PM 11/28/2021   10:00 AM 11/28/2021   10:00 PM 12/21/2021    1:08 PM  Fall Risk  Patient Fall Risk Level High fall risk High fall risk High fall  risk High fall risk High fall risk   Functional Status Survey:    Vitals:   01/03/22 1032  BP: (!) 179/89  Pulse: 85  Resp: 20  Temp: (!) 97 F (36.1 C)  SpO2: 97%  Weight: 145 lb (65.8 kg)   Body mass index is 22.71 kg/m. Physical Exam Constitutional:      Appearance: Normal appearance.  HENT:     Head: Normocephalic and  atraumatic.     Nose: Nose normal.     Mouth/Throat:     Mouth: Mucous membranes are moist.  Eyes:     Conjunctiva/sclera: Conjunctivae normal.  Cardiovascular:     Rate and Rhythm: Normal rate and regular rhythm.  Pulmonary:     Effort: Pulmonary effort is normal.     Breath sounds: Normal breath sounds.  Abdominal:     General: Bowel sounds are normal.     Palpations: Abdomen is soft.  Skin:    General: Skin is warm and dry.     Findings: No rash.     Comments: Right foot wound with dressing.  Neurological:     General: No focal deficit present.     Mental Status: She is alert and oriented to person, place, and time.  Psychiatric:        Mood and Affect: Mood normal.        Behavior: Behavior normal.        Thought Content: Thought content normal.        Judgment: Judgment normal.     Labs reviewed: Recent Labs    11/25/21 0632 11/26/21 0412 11/27/21 0556 11/29/21 0632  NA 132* 134*  --  135  K 3.7 4.1  --  3.9  CL 102 104  --  104  CO2 25 23  --  28  GLUCOSE 89 84  --  90  BUN 19 11  --  8  CREATININE 0.44 0.36* 0.41* 0.38*  CALCIUM 8.5* 8.7*  --  8.6*   Recent Labs    06/22/21 1251 11/24/21 1633 11/25/21 0632  AST 20 19 14*  ALT '11 9 9  '$ ALKPHOS 53 61 51  BILITOT 0.4 0.6 0.7  PROT 7.3 7.2 5.9*  ALBUMIN 3.8 3.0* 2.4*   Recent Labs    09/22/21 1112 11/24/21 1633 11/25/21 0632 11/26/21 0412 11/29/21 0632 12/21/21 1238  WBC 12.2* 16.2*   < > 7.7 6.0 9.8  NEUTROABS 9.8* 13.2*  --   --   --  7.6  HGB 11.7* 10.1*   < > 8.6* 9.0* 10.0*  HCT 35.4* 30.9*   < > 25.9* 27.2* 31.2*  MCV 83.5 80.5   < > 80.2 80.2 80.8   PLT 402* 448*   < > 394 443* 472*   < > = values in this interval not displayed.   No results found for: "TSH" No results found for: "HGBA1C" No results found for: "CHOL", "HDL", "LDLCALC", "LDLDIRECT", "TRIG", "CHOLHDL"  Significant Diagnostic Results in last 30 days:  No results found.  Assessment/Plan  1. Pruritus -  no rashes noted - loratadine (CLARITIN) 10 MG tablet; Take 1 tablet (10 mg total) by mouth at bedtime.  Dispense: 30 tablet; Refill: 11 - continue moisturizing cream to skin  2. Chronic pain due to neoplasm -   pain is stable, will decrease Oxycodone  to 5 mg Q 6 hours PRN -   continue OxyContin 12 HR, Robaxin and Cymbalta     Family/ staff Communication:  discussed plan of care with resident and charge nurse.  Labs/tests ordered:   None

## 2022-01-11 ENCOUNTER — Encounter: Payer: Self-pay | Admitting: Student

## 2022-01-11 ENCOUNTER — Other Ambulatory Visit: Payer: Self-pay | Admitting: Student

## 2022-01-11 DIAGNOSIS — C060 Malignant neoplasm of cheek mucosa: Secondary | ICD-10-CM

## 2022-01-11 MED ORDER — OXYCODONE HCL 5 MG PO TABS: 5.0000 mg | ORAL_TABLET | ORAL | 0 refills | Status: AC | PRN

## 2022-01-13 ENCOUNTER — Encounter: Payer: Medicare Other | Attending: Physician Assistant | Admitting: Physician Assistant

## 2022-01-13 DIAGNOSIS — J449 Chronic obstructive pulmonary disease, unspecified: Secondary | ICD-10-CM | POA: Insufficient documentation

## 2022-01-13 DIAGNOSIS — L988 Other specified disorders of the skin and subcutaneous tissue: Secondary | ICD-10-CM | POA: Insufficient documentation

## 2022-01-13 DIAGNOSIS — Z9221 Personal history of antineoplastic chemotherapy: Secondary | ICD-10-CM | POA: Diagnosis not present

## 2022-01-13 DIAGNOSIS — I10 Essential (primary) hypertension: Secondary | ICD-10-CM | POA: Diagnosis not present

## 2022-01-13 DIAGNOSIS — C069 Malignant neoplasm of mouth, unspecified: Secondary | ICD-10-CM | POA: Insufficient documentation

## 2022-01-13 DIAGNOSIS — D638 Anemia in other chronic diseases classified elsewhere: Secondary | ICD-10-CM | POA: Diagnosis not present

## 2022-01-13 DIAGNOSIS — L97822 Non-pressure chronic ulcer of other part of left lower leg with fat layer exposed: Secondary | ICD-10-CM | POA: Insufficient documentation

## 2022-01-13 DIAGNOSIS — L97812 Non-pressure chronic ulcer of other part of right lower leg with fat layer exposed: Secondary | ICD-10-CM | POA: Diagnosis not present

## 2022-01-13 DIAGNOSIS — L89893 Pressure ulcer of other site, stage 3: Secondary | ICD-10-CM | POA: Diagnosis not present

## 2022-01-13 NOTE — Progress Notes (Addendum)
[name] [name] ([id number]) [id number] Page 1 of 6 Visit Report for 01/13/2022 Chief Complaint Document Details Patient Name: Date of Service: [name] [name], [name] [name] 01/13/2022 10:00 A M Medical Record Number: [id number] Patient Account Number: [id number] Date of Birth/Sex: Treating RN: [birth year] (89 y.o. [name] Primary Care Provider: Dewayne Shorter Other Clinician: Referring Provider: Treating Provider/Extender: Iris Pert in Treatment: 4 Information Obtained from: Patient Chief Complaint Bilateral LE Ulcers Electronic Signature(s) Signed: 01/13/2022 10:11:35 AM By: Worthy Keeler PA-C Entered By: Worthy Keeler on 01/13/2022 10:11:35 -------------------------------------------------------------------------------- HPI Details Patient Name: Date of Service: [name] [name]. 01/13/2022 10:00 A M Medical Record Number: [id number] Patient Account Number: [id number] Date of Birth/Sex: Treating RN: 11-26-1925 (89 y.o. [name] Primary Care Provider: Dewayne Shorter Other Clinician: Referring Provider: Treating Provider/Extender: Temple Pacini, Jordan Hawks Weeks in Treatment: 4 History of Present Illness HPI Description: 12-13-2021 patient presents today for initial inspection here in our clinic as a referral from [facility] regarding issues that she has been having with her feet although it is really not just her feet but her legs as well as her hands and her back and her thighs as well. This appears to have been a reaction from the chemotherapy as best we can tell. With that being said fortunately she seems to be getting better for the most part across the board including her feet although she still has a significant wound on the right foot and the feet in general still have a lot of very dry skin remaining at this point. Fortunately there does not appear to be any signs of infection locally nor systemically which is  great news. She is seen with her son-in-law here in the clinic today. Patient does have a history of COPD, hypertension, anemia of chronic disease, and she does have mouth cancer as well that is what she has been undergoing chemotherapy and radiation for. 12-22-2021 upon evaluation today patient appears to be doing well currently in regard to her wounds. In fact a lot of them are completely healed as of today. She also has a new wound opened up on her back unfortunately this appears to be more of a pressure injury at this point. I am concerned more about that but I am anything else to be perfectly honest. 12-30-2021 upon evaluation today patient appears to be doing well currently in regard to her wounds. The only thing that is left open at this point is the top of her right foot which is significantly smaller and her back also significantly smaller and looking much better. In general I am actually very pleased with where we stand today. SUMER, MOOREHOUSE ([id number]) [id number] Page 2 of 6 01-13-2022 upon evaluation today patient appears to be doing well currently in regard to both wounds on her right foot as well as her back. She has been tolerating the dressing changes without complication. Fortunately there does not appear to be any signs of active infection at this time. No fevers, chills, nausea, vomiting, or diarrhea. Electronic Signature(s) Signed: 01/13/2022 10:38:15 AM By: Worthy Keeler PA-C Entered By: Worthy Keeler on 01/13/2022 10:38:14 -------------------------------------------------------------------------------- Physical Exam Details Patient Name: Date of Service: [name] [name] 01/13/2022 10:00 A M Medical Record Number: [id number] Patient Account Number: [id number] Date of Birth/Sex: Treating RN: 13-Nov-1925 (89 y.o. [name] Primary Care Provider: Dewayne Shorter Other Clinician: Referring Provider: Treating Provider/Extender: Alfonzo Beers Weeks in Treatment: 4 Constitutional Well-nourished and well-hydrated in  no acute distress. Respiratory normal breathing without difficulty. Psychiatric this patient is able to make decisions and demonstrates good insight into disease process. Alert and Oriented x 3. pleasant and cooperative. Notes Upon inspection patient's wound bed actually showed signs at both locations of doing excellent she is making great progress and I am very pleased with where we stand today. I do not see any signs of active infection at this time which is great news as well. Electronic Signature(s) Signed: 01/13/2022 10:38:31 AM By: Worthy Keeler PA-C Entered By: Worthy Keeler on 01/13/2022 10:38:31 -------------------------------------------------------------------------------- Physician Orders Details Patient Name: Date of Service: [name] [name] 01/13/2022 10:00 A M Medical Record Number: [id number] Patient Account Number: [id number] Date of Birth/Sex: Treating RN: [birth year] (89 y.o. [name] Primary Care Provider: Dewayne Shorter Other Clinician: Referring Provider: Treating Provider/Extender: Iris Pert in Treatment: 4 Verbal / Phone Orders: No Diagnosis Coding TASHAI, CATINO ([id number]) [id number] Page 3 of 6 ICD-10 Coding Code Description L98.8 Other specified disorders of the skin and subcutaneous tissue L97.822 Non-pressure chronic ulcer of other part of left lower leg with fat layer exposed L97.812 Non-pressure chronic ulcer of other part of right lower leg with fat layer exposed L89.893 Pressure ulcer of other site, stage 3 J44.9 Chronic obstructive pulmonary disease, unspecified I10 Essential (primary) hypertension D63.8 Anemia in other chronic diseases classified elsewhere C06.9 Malignant neoplasm of mouth, unspecified Follow-up Appointments Return Appointment in 1 week. Bathing/ Shower/  Hygiene Clean wound with Normal Saline or wound cleanser. Anesthetic (Use 'Patient Medications' Section for Anesthetic Order Entry) Wound #1 Right,Distal,Dorsal,Anterior Foot Lidocaine applied to wound bed Wound Treatment Wound #1 - Foot Wound Laterality: Dorsal, Right, Anterior, Distal Cleanser: Soap and Water 1 x Per Day/30 Days Discharge Instructions: Gently cleanse wound with antibacterial soap, rinse and pat dry prior to dressing wounds Prim Dressing: Xeroform 4x4-HBD (in/in) 1 x Per Day/30 Days ary Discharge Instructions: Apply Xeroform 4x4-HBD (in/in) as directed Secondary Dressing: (BORDER) Zetuvit Plus SILICONE BORDER Dressing 5x5 (in/in) 1 x Per Day/30 Days Discharge Instructions: Please do not put silicone bordered dressings under wraps. Use non-bordered dressing only. Wound #8 - Back Wound Laterality: Medial, Distal Cleanser: Soap and Water 1 x Per Day/30 Days Discharge Instructions: Gently cleanse wound with antibacterial soap, rinse and pat dry prior to dressing wounds Prim Dressing: Xeroform 4x4-HBD (in/in) 1 x Per Day/30 Days ary Discharge Instructions: Apply Xeroform 4x4-HBD (in/in) as directed Secondary Dressing: (BORDER) Zetuvit Plus SILICONE BORDER Dressing 5x5 (in/in) 1 x Per Day/30 Days Discharge Instructions: Please do not put silicone bordered dressings under wraps. Use non-bordered dressing only. Electronic Signature(s) Signed: 01/13/2022 1:27:42 PM By: Rosalio Loud MSN RN CNS WTA Signed: 01/13/2022 1:38:11 PM By: Worthy Keeler PA-C Entered By: Rosalio Loud on 01/13/2022 13:27:41 -------------------------------------------------------------------------------- Problem List Details Patient Name: Date of Service: [name] [name]. 01/13/2022 10:00 A M Medical Record Number: [id number] Patient Account Number: [id number] Date of Birth/Sex: Treating RN: April 13, 1925 (89 y.o. [name] Primary Care Provider: Dewayne Shorter Other Clinician: Referring  Provider: Treating Provider/Extender: Iris Pert in Treatment: [address], [place] ([id number]) [id number] Page 4 of 6 Active Problems ICD-10 Encounter Code Description Active Date MDM Diagnosis L98.8 Other specified disorders of the skin and subcutaneous tissue 12/13/2021 No Yes L97.822 Non-pressure chronic ulcer of other part of left lower leg with fat layer exposed12/05/2021 No Yes L97.812 Non-pressure chronic ulcer of other part of right lower leg with fat layer 12/13/2021 No Yes exposed  N56.213 Pressure ulcer of other site, stage 3 12/22/2021 No Yes J44.9 Chronic obstructive pulmonary disease, unspecified 12/13/2021 No Yes I10 Essential (primary) hypertension 12/13/2021 No Yes D63.8 Anemia in other chronic diseases classified elsewhere 12/13/2021 No Yes C06.9 Malignant neoplasm of mouth, unspecified 12/13/2021 No Yes Inactive Problems Resolved Problems Electronic Signature(s) Signed: 01/13/2022 10:10:31 AM By: Worthy Keeler PA-C Entered By: Worthy Keeler on 01/13/2022 10:10:31 -------------------------------------------------------------------------------- Progress Note Details Patient Name: Date of Service: [name] [name]. 01/13/2022 10:00 A M Medical Record Number: [id number] Patient Account Number: [id number] Date of Birth/Sex: Treating RN: 1925/05/18 (89 y.o. [name] Primary Care Provider: Dewayne Shorter Other Clinician: Referring Provider: Treating Provider/Extender: Iris Pert in Treatment: 4 Subjective Chief Complaint Information obtained from Patient Bilateral LE Ulcers LUCILLE, WITTS ([id number]) [id number] Page 5 of 6 History of Present Illness (HPI) 12-13-2021 patient presents today for initial inspection here in our clinic as a referral from [facility] regarding issues that she has been having with her feet although it is really not just her feet  but her legs as well as her hands and her back and her thighs as well. This appears to have been a reaction from the chemotherapy as best we can tell. With that being said fortunately she seems to be getting better for the most part across the board including her feet although she still has a significant wound on the right foot and the feet in general still have a lot of very dry skin remaining at this point. Fortunately there does not appear to be any signs of infection locally nor systemically which is great news. She is seen with her son-in-law here in the clinic today. Patient does have a history of COPD, hypertension, anemia of chronic disease, and she does have mouth cancer as well that is what she has been undergoing chemotherapy and radiation for. 12-22-2021 upon evaluation today patient appears to be doing well currently in regard to her wounds. In fact a lot of them are completely healed as of today. She also has a new wound opened up on her back unfortunately this appears to be more of a pressure injury at this point. I am concerned more about that but I am anything else to be perfectly honest. 12-30-2021 upon evaluation today patient appears to be doing well currently in regard to her wounds. The only thing that is left open at this point is the top of her right foot which is significantly smaller and her back also significantly smaller and looking much better. In general I am actually very pleased with where we stand today. 01-13-2022 upon evaluation today patient appears to be doing well currently in regard to both wounds on her right foot as well as her back. She has been tolerating the dressing changes without complication. Fortunately there does not appear to be any signs of active infection at this time. No fevers, chills, nausea, vomiting, or diarrhea. Objective Constitutional Well-nourished and well-hydrated in no acute distress. Vitals Time Taken: 10:05 AM, Height: 68 in, Weight:  150 lbs, BMI: 22.8, Temperature: 97.8 F, Pulse: 81 bpm, Respiratory Rate: 18 breaths/min, Blood Pressure: 148/72 mmHg. Respiratory normal breathing without difficulty. Psychiatric this patient is able to make decisions and demonstrates good insight into disease process. Alert and Oriented x 3. pleasant and cooperative. General Notes: Upon inspection patient's wound bed actually showed signs at both locations of doing excellent she is making great progress and I am very pleased with where we  stand today. I do not see any signs of active infection at this time which is great news as well. Integumentary (Hair, Skin) Wound #1 status is Open. Original cause of wound was Gradually Appeared. The date acquired was: 11/15/2021. The wound has been in treatment 4 weeks. The wound is located on the [place]. The wound measures 1.8cm length x 2.5cm width x 0.1cm depth; 3.534cm^2 area and 0.353cm^3 volume. There is a medium amount of serosanguineous drainage noted. Wound #8 status is Open. Original cause of wound was Gradually Appeared. The date acquired was: 12/15/2021. The wound has been in treatment 3 weeks. The wound is located on the Distal,Medial Back. The wound measures 5.5cm length x 0.4cm width x 0.2cm depth; 1.728cm^2 area and 0.346cm^3 volume. There is a medium amount of serosanguineous drainage noted. 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 I am going to suggest that we have the patient continue to monitor for any signs of  infection or worsening. Based on what we are seeing currently I really think that we are headed in the right direction which is great news. 2. I am good recommend we continue with Xeroform followed by the border foam dressing. SCOTTI, MOTTER ([id number]) [id number] Page 6 of 6 3. I am also can recommend the patient should continue to monitor for any signs of infection or worsening obviously if anything changes she should contact the office and let me know. We will see patient back for reevaluation in 1 week here in the clinic. If anything worsens or changes patient will contact our office for additional recommendations. Electronic Signature(s) Signed: 01/13/2022 10:39:08 AM By: Worthy Keeler PA-C Entered By: Worthy Keeler on 01/13/2022 10:39:08 -------------------------------------------------------------------------------- SuperBill Details Patient Name: Date of Service: [name] [name] 01/13/2022 Medical Record Number: [id number] Patient Account Number: [id number] Date of Birth/Sex: Treating RN: [birth year] (89 y.o. [name] Primary Care Provider: Dewayne Shorter Other Clinician: Referring Provider: Treating Provider/Extender: Alfonzo Beers Weeks in Treatment: 4 Diagnosis Coding ICD-10 Codes Code Description L98.8 Other specified disorders of the skin and subcutaneous tissue L97.822 Non-pressure chronic ulcer of other part of left lower leg with fat layer exposed L97.812 Non-pressure chronic ulcer of other part of right lower leg with fat layer exposed L89.893 Pressure ulcer of other site, stage 3 J44.9 Chronic obstructive pulmonary disease, unspecified I10 Essential (primary) hypertension D63.8 Anemia in other chronic diseases classified elsewhere C06.9 Malignant neoplasm of mouth, unspecified Facility Procedures : CPT4 Code: [id number] Description: 99214 - WOUND CARE VISIT-LEV 4 EST PT Modifier: Quantity: 1 Physician  Procedures : CPT4 Code Description Modifier [id number] - WC PHYS LEVEL 3 - EST PT ICD-10 Diagnosis Description L98.8 Other specified disorders of the skin and subcutaneous tissue L97.822 Non-pressure chronic ulcer of other part of left lower leg with fat layer  exposed L97.812 Non-pressure chronic ulcer of other part of right lower leg with fat layer exposed L89.893 Pressure ulcer of other site, stage 3 Quantity: 1 Electronic Signature(s) Signed: 01/13/2022 1:29:19 PM By: Rosalio Loud MSN RN CNS WTA Signed: 01/13/2022 1:38:11 PM By: Worthy Keeler PA-C Previous Signature: 01/13/2022 10:44:50 AM Version  By: Worthy Keeler PA-C Entered By: Rosalio Loud on 01/13/2022 13:29:18
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
## 2022-01-15 MED ORDER — LORATADINE 10 MG PO TABS: 10.0000 mg | ORAL_TABLET | Freq: Every day | ORAL | 11 refills | Status: DC

## 2022-01-19 ENCOUNTER — Telehealth: Payer: Self-pay

## 2022-01-19 NOTE — Telephone Encounter (Signed)
Transition Care Management Unsuccessful Follow-up Telephone Call  Date of discharge and from where:  [facility] 01/18/2022  Attempts:  1st Attempt  Reason for unsuccessful TCM follow-up call:  Left voice message Juanda Crumble, [place] Direct Dial [phone]

## 2022-01-20 NOTE — Telephone Encounter (Signed)
Not a patient at this practice, goes to [place], [place] Direct Dial [phone]

## 2022-01-26 ENCOUNTER — Encounter: Payer: Medicare Other | Admitting: Physician Assistant

## 2022-01-26 DIAGNOSIS — L988 Other specified disorders of the skin and subcutaneous tissue: Secondary | ICD-10-CM | POA: Diagnosis not present

## 2022-01-26 NOTE — Progress Notes (Addendum)
GRANT, [name] [name] ([id number]) [id number] Page 1 of 7 Visit Report for 01/26/2022 Chief Complaint Document Details Patient Name: Date of Service: [name] [name], [name] [name] 01/26/2022 3:15 PM Medical Record Number: [id number] Patient Account Number: [id number] Date of Birth/Sex: Treating RN: [birth year] (89 y.o. [name] [name] Primary Care Provider: Dewayne Shorter Other Clinician: Referring Provider: Treating Provider/Extender: Iris Pert in Treatment: 6 Information Obtained from: Patient Chief Complaint Bilateral LE Ulcers Electronic Signature([name]) Signed: 01/26/2022 3:24:24 PM By: Worthy Keeler PA-C Entered By: Worthy Keeler on 01/26/2022 15:24:24 -------------------------------------------------------------------------------- HPI Details Patient Name: Date of Service: [name] [name] 01/26/2022 3:15 PM Medical Record Number: [id number] Patient Account Number: [id number] Date of Birth/Sex: Treating RN: January 29, 1925 (89 y.o. [name] [name] Primary Care Provider: Dewayne Shorter Other Clinician: Referring Provider: Treating Provider/Extender: Temple Pacini, Susanne Greenhouse in Treatment: 6 History of Present Illness HPI Description: 12-13-2021 patient presents today for initial inspection here in our clinic as a referral from [facility] regarding issues that she has been having with her feet although it is really not just her feet but her legs as well as her hands and her back and her thighs as well. This appears to have been a reaction from the chemotherapy as best we can tell. With that being said fortunately she seems to be getting better for the most part across the board including her feet although she still has a significant wound on the right foot and the feet in general still have a lot of very dry skin remaining at this point. Fortunately there does not appear to be any signs of infection locally nor systemically which is great  news. She is seen with her son-in-law here in the clinic today. Patient does have a history of COPD, hypertension, anemia of chronic disease, and she does have mouth cancer as well that is what she has been undergoing chemotherapy and radiation for. 12-22-2021 upon evaluation today patient appears to be doing well currently in regard to her wounds. In fact a lot of them are completely healed as of today. She also has a new wound opened up on her back unfortunately this appears to be more of a pressure injury at this point. I am concerned more about that but I am anything else to be perfectly honest. 12-30-2021 upon evaluation today patient appears to be doing well currently in regard to her wounds. The only thing that is left open at this point is the top of her right foot which is significantly smaller and her back also significantly smaller and looking much better. In general I am actually very pleased with where we stand today. [name] [name], [name] [name] ([id number]) [id number] Page 2 of 7 01-13-2022 upon evaluation today patient appears to be doing well currently in regard to both wounds on her right foot as well as her back. She has been tolerating the dressing changes without complication. Fortunately there does not appear to be any signs of active infection at this time. No fevers, chills, nausea, vomiting, or diarrhea. 01-26-2022 upon evaluation today patient appears to be doing well currently in regard to her wound. She has been tolerating the dressing changes without complication. Fortunately there does not appear to be any evidence of active infection locally nor systemically which is great news. No fevers, chills, nausea, vomiting, or diarrhea. Her wounds actually are showing signs of excellent improvement both the foot as well as the back are almost completely healed. Electronic Signature([name]) Signed: 01/26/2022 4:01:19 PM By: Melburn Hake,  Margarita Grizzle PA-C Entered By: Worthy Keeler on 01/26/2022 16:01:19 -------------------------------------------------------------------------------- Physical Exam Details Patient Name: Date of Service: [name] [name], [name] [name] 01/26/2022 3:15 PM Medical Record Number: [id number] Patient Account Number: [id number] Date of Birth/Sex: Treating RN: [birth year] (89 y.o. [name] [name] Primary Care Provider: Dewayne Shorter Other Clinician: Referring Provider: Treating Provider/Extender: Alfonzo Beers Weeks in Treatment: 6 Constitutional Well-nourished and well-hydrated in no acute distress. Respiratory normal breathing without difficulty. Psychiatric this patient is able to make decisions and demonstrates good insight into disease process. Alert and Oriented x 3. pleasant and cooperative. Notes Upon inspection patient'[name] wound bed actually showed signs of excellent granulation epithelization both in regard to the foot as well as her back. She has been tolerating the dressing changes without complication and fortunately there does not appear to be any signs of infection locally nor systemically which is great news and overall I am extremely pleased with where she stands today. I think were very close probably within a couple weeks of complete resolution. Electronic Signature([name]) Signed: 01/26/2022 4:01:46 PM By: Worthy Keeler PA-C Entered By: Worthy Keeler on 01/26/2022 16:01:46 -------------------------------------------------------------------------------- Physician Orders Details Patient Name: Date of Service: [name] [name] 01/26/2022 3:15 PM Medical Record Number: [id number] Patient Account Number: [id number] Date of Birth/Sex: Treating RN: July 07, 1925 (89 y.o. [name] [name] Primary Care Provider: Dewayne Shorter Other Clinician: Referring Provider: Treating Provider/Extender: Iris Pert in Treatment: [address] [place] [name] ([id number]) [phone].pdf Page 3 of  7 Verbal / Phone Orders: No Diagnosis Coding ICD-10 Coding Code Description L98.8 Other specified disorders of the skin and subcutaneous tissue L97.822 Non-pressure chronic ulcer of other part of left lower leg with fat layer exposed L97.812 Non-pressure chronic ulcer of other part of right lower leg with fat layer exposed L89.893 Pressure ulcer of other site, stage 3 J44.9 Chronic obstructive pulmonary disease, unspecified I10 Essential (primary) hypertension D63.8 Anemia in other chronic diseases classified elsewhere C06.9 Malignant neoplasm of mouth, unspecified Follow-up Appointments Return Appointment in 1 week. Bathing/ Shower/ Hygiene Clean wound with Normal Saline or wound cleanser. Anesthetic (Use 'Patient Medications' Section for Anesthetic Order Entry) Wound #1 Right,Distal,Dorsal,Anterior Foot Lidocaine applied to wound bed Wound Treatment Wound #1 - Foot Wound Laterality: Dorsal, Right, Anterior, Distal Cleanser: Soap and Water 1 x Per Day/30 Days Discharge Instructions: Gently cleanse wound with antibacterial soap, rinse and pat dry prior to dressing wounds Prim Dressing: Xeroform 4x4-HBD (in/in) 1 x Per Day/30 Days ary Discharge Instructions: Apply Xeroform 4x4-HBD (in/in) as directed Secondary Dressing: (BORDER) Zetuvit Plus SILICONE BORDER Dressing 5x5 (in/in) 1 x Per Day/30 Days Discharge Instructions: Please do not put silicone bordered dressings under wraps. Use non-bordered dressing only. Wound #8 - Back Wound Laterality: Medial, Distal Peri-Wound Care: AandD Ointment 1 x Per Day/30 Days Discharge Instructions: Apply AandD Ointment as directed Electronic Signature([name]) Signed: 01/26/2022 5:43:57 PM By: Worthy Keeler PA-C Signed: 01/26/2022 6:11:46 PM By: Gretta Cool, BSN, RN, CWS, Kim RN, BSN Entered By: Gretta Cool, BSN, RN, CWS, [name] [name] on 01/26/2022 15:58:58 -------------------------------------------------------------------------------- Problem List Details Patient Name:  Date of Service: [name] [name] 01/26/2022 3:15 PM Medical Record Number: [id number] Patient Account Number: [id number] Date of Birth/Sex: Treating RN: May 15, 1925 (89 y.o. [name] [name] Primary Care Provider: Dewayne Shorter Other Clinician: Referring Provider: Treating Provider/Extender: Iris Pert in Treatment: [address] [name] [name], [name] [name] ([id number]) [id number] Page 4 of 7 ICD-10 Encounter Code Description Active Date MDM Diagnosis L98.8 Other specified disorders of the skin  and subcutaneous tissue 12/13/2021 No Yes L97.822 Non-pressure chronic ulcer of other part of left lower leg with fat layer exposed12/05/2021 No Yes L97.812 Non-pressure chronic ulcer of other part of right lower leg with fat layer 12/13/2021 No Yes exposed L89.893 Pressure ulcer of other site, stage 3 12/22/2021 No Yes J44.9 Chronic obstructive pulmonary disease, unspecified 12/13/2021 No Yes I10 Essential (primary) hypertension 12/13/2021 No Yes D63.8 Anemia in other chronic diseases classified elsewhere 12/13/2021 No Yes C06.9 Malignant neoplasm of mouth, unspecified 12/13/2021 No Yes Inactive Problems Resolved Problems Electronic Signature([name]) Signed: 01/26/2022 3:24:18 PM By: Worthy Keeler PA-C Entered By: Worthy Keeler on 01/26/2022 15:24:18 -------------------------------------------------------------------------------- Progress Note Details Patient Name: Date of Service: [name] [name] 01/26/2022 3:15 PM Medical Record Number: [id number] Patient Account Number: [id number] Date of Birth/Sex: Treating RN: 1925/11/28 (89 y.o. [name] [name] Primary Care Provider: Dewayne Shorter Other Clinician: Referring Provider: Treating Provider/Extender: Iris Pert in Treatment: 6 Subjective Chief Complaint Information obtained from Patient Bilateral LE Ulcers History of Present Illness (HPI) [name] [name], [name] [name] ([id number])  [id number] Page 5 of 7 12-13-2021 patient presents today for initial inspection here in our clinic as a referral from [facility] regarding issues that she has been having with her feet although it is really not just her feet but her legs as well as her hands and her back and her thighs as well. This appears to have been a reaction from the chemotherapy as best we can tell. With that being said fortunately she seems to be getting better for the most part across the board including her feet although she still has a significant wound on the right foot and the feet in general still have a lot of very dry skin remaining at this point. Fortunately there does not appear to be any signs of infection locally nor systemically which is great news. She is seen with her son-in-law here in the clinic today. Patient does have a history of COPD, hypertension, anemia of chronic disease, and she does have mouth cancer as well that is what she has been undergoing chemotherapy and radiation for. 12-22-2021 upon evaluation today patient appears to be doing well currently in regard to her wounds. In fact a lot of them are completely healed as of today. She also has a new wound opened up on her back unfortunately this appears to be more of a pressure injury at this point. I am concerned more about that but I am anything else to be perfectly honest. 12-30-2021 upon evaluation today patient appears to be doing well currently in regard to her wounds. The only thing that is left open at this point is the top of her right foot which is significantly smaller and her back also significantly smaller and looking much better. In general I am actually very pleased with where we stand today. 01-13-2022 upon evaluation today patient appears to be doing well currently in regard to both wounds on her right foot as well as her back. She has been tolerating the dressing changes without complication. Fortunately  there does not appear to be any signs of active infection at this time. No fevers, chills, nausea, vomiting, or diarrhea. 01-26-2022 upon evaluation today patient appears to be doing well currently in regard to her wound. She has been tolerating the dressing changes without complication. Fortunately there does not appear to be any evidence of active infection locally nor systemically which is great news. No fevers, chills, nausea, vomiting, or diarrhea. Her wounds  actually are showing signs of excellent improvement both the foot as well as the back are almost completely healed. Objective Constitutional Well-nourished and well-hydrated in no acute distress. Vitals Time Taken: 3:41 PM, Height: 68 in, Weight: 150 lbs, BMI: 22.8, Temperature: 98.1 F, Pulse: 81 bpm, Respiratory Rate: 16 breaths/min, Blood Pressure: 181/68 mmHg. Respiratory normal breathing without difficulty. Psychiatric this patient is able to make decisions and demonstrates good insight into disease process. Alert and Oriented x 3. pleasant and cooperative. General Notes: Upon inspection patient'[name] wound bed actually showed signs of excellent granulation epithelization both in regard to the foot as well as her back. She has been tolerating the dressing changes without complication and fortunately there does not appear to be any signs of infection locally nor systemically which is great news and overall I am extremely pleased with where she stands today. I think were very close probably within a couple weeks of complete resolution. Integumentary (Hair, Skin) Wound #1 status is Open. Original cause of wound was Gradually Appeared. The date acquired was: 11/15/2021. The wound has been in treatment 6 weeks. The wound is located on the [place]. The wound measures 0.2cm length x 1cm width x 0.1cm depth; 0.157cm^2 area and 0.016cm^3 volume. There is a medium amount of serosanguineous drainage noted. Wound #8  status is Open. Original cause of wound was Gradually Appeared. The date acquired was: 12/15/2021. The wound has been in treatment 5 weeks. The wound is located on the Distal,Medial Back. The wound measures 1cm length x 0.2cm width x 0.1cm depth; 0.157cm^2 area and 0.016cm^3 volume. There is a medium amount of serosanguineous drainage noted. Assessment Active Problems ICD-10 Other specified disorders of the skin and subcutaneous tissue Non-pressure chronic ulcer of other part of left lower leg with fat layer exposed Non-pressure chronic ulcer of other part of right lower leg with fat layer exposed Pressure ulcer of other site, stage 3 Chronic obstructive pulmonary disease, unspecified Essential (primary) hypertension Anemia in other chronic diseases classified elsewhere Malignant neoplasm of mouth, unspecified Plan Follow-up Appointments: [name] [name], [name] [name] ([id number]) [id number] Page 6 of 7 Return Appointment in 1 week. Bathing/ Shower/ Hygiene: Clean wound with Normal Saline or wound cleanser. Anesthetic (Use 'Patient Medications' Section for Anesthetic Order Entry): Wound #1 Right,Distal,Dorsal,Anterior Foot: Lidocaine applied to wound bed WOUND #1: - Foot Wound Laterality: Dorsal, Right, Anterior, Distal Cleanser: Soap and Water 1 x Per Day/30 Days Discharge Instructions: Gently cleanse wound with antibacterial soap, rinse and pat dry prior to dressing wounds Prim Dressing: Xeroform 4x4-HBD (in/in) 1 x Per Day/30 Days ary Discharge Instructions: Apply Xeroform 4x4-HBD (in/in) as directed Secondary Dressing: (BORDER) Zetuvit Plus SILICONE BORDER Dressing 5x5 (in/in) 1 x Per Day/30 Days Discharge Instructions: Please do not put silicone bordered dressings under wraps. Use non-bordered dressing only. WOUND #8: - Back Wound Laterality: Medial, Distal Peri-Wound Care: AandD Ointment 1 x Per Day/30 Days Discharge Instructions: Apply AandD Ointment as  directed 1. I am going to recommend currently that we have the patient continue to monitor for any signs of infection or worsening. Based on what I am seeing currently I do believe that she is probably within 2 weeks of complete resolution of very pleased in that regard. 2. I am going to continue with the Xeroform and bordered foam dressing to the top of the foot. 3. I am also can recommend that we continue with AandD ointment to the back I do not think there is any particular dressing is needed at this point although  I do think that she really does seem to be doing well I think she should continue with her Roho cushion which is doing excellent. We will see patient back for reevaluation in 2 weeks here in the clinic. If anything worsens or changes patient will contact our office for additional recommendations. Electronic Signature([name]) Signed: 01/26/2022 4:02:50 PM By: Worthy Keeler PA-C Entered By: Worthy Keeler on 01/26/2022 16:02:49 -------------------------------------------------------------------------------- SuperBill Details Patient Name: Date of Service: [name] [name] 01/26/2022 Medical Record Number: [id number] Patient Account Number: [id number] Date of Birth/Sex: Treating RN: 1925-11-14 (89 y.o. Charolette Forward, [name] [name] Primary Care Provider: Dewayne Shorter Other Clinician: Referring Provider: Treating Provider/Extender: Alfonzo Beers Weeks in Treatment: 6 Diagnosis Coding ICD-10 Codes Code Description L98.8 Other specified disorders of the skin and subcutaneous tissue L97.822 Non-pressure chronic ulcer of other part of left lower leg with fat layer exposed L97.812 Non-pressure chronic ulcer of other part of right lower leg with fat layer exposed L89.893 Pressure ulcer of other site, stage 3 J44.9 Chronic obstructive pulmonary disease, unspecified I10 Essential (primary) hypertension D63.8 Anemia in other chronic diseases classified elsewhere C06.9 Malignant  neoplasm of mouth, unspecified Physician Procedures : CPT4 Code Description Modifier [id number] - WC PHYS LEVEL 3 - EST PT ICD-10 Diagnosis Description L98.8 Other specified disorders of the skin and subcutaneous tissue L97.822 Non-pressure chronic ulcer of other part of left lower leg with fat layer  exposed L97.812 Non-pressure chronic ulcer of other part of right lower leg with fat layer exposed [name] [name], [name] [name] ([id number]) [id number] Page 7 [phone] Pressure ulcer of other site, stage 3 Quantity: 1 of 7 Electronic Signature([name]) Signed: 01/26/2022 4:03:07 PM By: Worthy Keeler PA-C Entered By: Worthy Keeler on 01/26/2022 16:03:06

## 2022-01-26 NOTE — Progress Notes (Addendum)
GERALINE, HALBERSTADT ([id number]) [id number] Page 1 of 10 Visit Report for 01/26/2022 Arrival Information Details Patient Name: Date of Service: [name] [name], [name] [name] 01/26/2022 3:15 PM Medical Record Number: [id number] Patient Account Number: [id number] Date of Birth/Sex: Treating RN: 05-Oct-1925 (89 y.o. Mar[place]e Sh[place] Primary Care Ebert Forrester: Dewayne Shorter Other Clinician: Referring Jettie Mannor: Treating Eden Toohey/Extender: Iris Pert in Treatment: 6 Visit Information History Since Last Visit Added or deleted any medications: No Patient Arrived: Walker Has Dressing in [place] as Prescribed: Yes Arrival Time: 15:36 Pain Present Now: No Accompanied By: self Transfer Assistance: None Patient Identification Verified: Yes Secondary Verification Process Completed: Yes Patient Requires Transmission-Based Precautions: No Patient Has Alerts: Yes Patient Alerts: NOT Diabetic Electronic Signature(s) Signed: 01/26/2022 6:11:46 PM By: Gretta Cool, BSN, RN, CWS, Kim RN, BSN Entered By: Gretta Cool, BSN, RN, CWS, Kim on 01/26/2022 15:41:32 -------------------------------------------------------------------------------- Clinic Level of Care Assessment Details Patient Name: Date of Service: [name] [name], [name] [name] 01/26/2022 3:15 PM Medical Record Number: [id number] Patient Account Number: [id number] Date of Birth/Sex: Treating RN: [birth year] (89 y.o. Mar[place]e Sh[place] Primary Care Karlen Barbar: Dewayne Shorter Other Clinician: Referring Luismanuel Corman: Treating Jalaysia Lobb/Extender: Iris Pert in Treatment: 6 Clinic Level of Care Assessment Items TOOL 4 Quantity Score '[]'$  - 0 Use when only an EandM is performed on FOLLOW-UP visit ASSESSMENTS - Nursing Assessment / Reassessment X- 1 10 Reassessment of Co-morbidities (includes updates in patient status) X- 1 5 Reassessment of Adherence to Treatment Plan ASSESSMENTS - Wound and Skin A ssessment / Reassessment '[]'$   - 0 Simple Wound Assessment / Reassessment - one wound Fincher, [name] ([id number]) [id number] Page 2 of 10 X- 1 5 Complex Wound Assessment / Reassessment - multiple wounds '[]'$  - 0 Dermatologic / Skin Assessment (not related to wound area) ASSESSMENTS - Focused Assessment '[]'$  - 0 Circumferential Edema Measurements - multi extremities '[]'$  - 0 Nutritional Assessment / Counseling / Intervention '[]'$  - 0 Lower Extremity Assessment (monofilament, tuning fork, pulses) '[]'$  - 0 Peripheral Arterial Disease Assessment (using hand held doppler) ASSESSMENTS - Ostomy and/or Continence Assessment and Care '[]'$  - 0 Incontinence Assessment and Management '[]'$  - 0 Ostomy Care Assessment and Management (repouching, etc.) PROCESS - Coordination of Care X - Simple Patient / Family Education for ongoing care 1 15 '[]'$  - 0 Complex (extensive) Patient / Family Education for ongoing care X- 1 10 Staff obtains Consents, Records, T Results / Process Orders est '[]'$  - 0 Staff telephones HHA, Nursing Homes / Clarify orders / etc '[]'$  - 0 Routine Transfer to another Facility (non-emergent condition) '[]'$  - 0 Routine Hospital Admission (non-emergent condition) '[]'$  - 0 New Admissions / Biomedical engineer / Ordering NPWT Apligraf, etc. , '[]'$  - 0 Emergency Hospital Admission (emergent condition) X- 1 10 Simple Discharge Coordination '[]'$  - 0 Complex (extensive) Discharge Coordination PROCESS - Special Needs '[]'$  - 0 Pediatric / Minor Patient Management '[]'$  - 0 Isolation Patient Management '[]'$  - 0 Hearing / Language / Visual special needs '[]'$  - 0 Assessment of Community assistance (transportation, D/C planning, etc.) '[]'$  - 0 Additional assistance / Altered mentation '[]'$  - 0 Support Surface(s) Assessment (bed, cushion, seat, etc.) INTERVENTIONS - Wound Cleansing / Measurement X - Simple Wound Cleansing - one wound 1 5 '[]'$  - 0 Complex Wound Cleansing - multiple wounds X- 1 5 Wound  Imaging (photographs - any number of wounds) '[]'$  - 0 Wound Tracing (instead of photographs) X- 1 5 Simple Wound Measurement - one wound '[]'$  - 0 Complex Wound Measurement - multiple wounds  INTERVENTIONS - Wound Dressings '[]'$  - 0 Small Wound Dressing one or multiple wounds X- 1 15 Medium Wound Dressing one or multiple wounds '[]'$  - 0 Large Wound Dressing one or multiple wounds '[]'$  - 0 Application of Medications - topical '[]'$  - 0 Application of Medications - injection INTERVENTIONS - Miscellaneous '[]'$  - 0 External ear exam '[]'$  - 0 Specimen Collection (cultures, biopsies, blood, body fluids, etc.) '[]'$  - 0 Specimen(s) / Culture(s) sent or taken to Lab for analysis AMIYRAH, LAMERE ([id number]) [id number].pdf Page 3 of 10 '[]'$  - 0 Patient Transfer (multiple staff / Civil Service fast streamer / Similar devices) '[]'$  - 0 Simple Staple / Suture removal (25 or less) '[]'$  - 0 Complex Staple / Suture removal (26 or more) '[]'$  - 0 Hypo / Hyperglycemic Management (close monitor of Blood Glucose) '[]'$  - 0 Ankle / Brachial Index (ABI) - do not check if billed separately X- 1 5 Vital Signs Has the patient been seen at the hospital within the last three years: Yes Total Score: 90 Level Of Care: New/Established - Level 3 Electronic Signature(s) Signed: 01/26/2022 6:11:46 PM By: Gretta Cool, BSN, RN, CWS, Kim RN, BSN Entered By: Gretta Cool, BSN, RN, CWS, Kim on 01/26/2022 16:08:39 -------------------------------------------------------------------------------- Encounter Discharge Information Details Patient Name: Date of Service: JUMANA, PACCIONE 01/26/2022 3:15 PM Medical Record Number: [id number] Patient Account Number: [id number] Date of Birth/Sex: Treating RN: 1925/09/22 (89 y.o. Marlowe Shores Primary Care Diquan Kassis: Dewayne Shorter Other Clinician: Referring Lanita Stammen: Treating Capri Veals/Extender: Iris Pert in Treatment: 6 Encounter Discharge Information Items Discharge  Condition: Stable Ambulatory Status: Ambulatory Discharge Destination: Home Transportation: Private Auto Accompanied By: self Schedule Follow-up Appointment: Yes Clinical Summary of Care: Electronic Signature(s) Signed: 01/26/2022 6:11:46 PM By: Gretta Cool, BSN, RN, CWS, Kim RN, BSN Entered By: Gretta Cool, BSN, RN, CWS, Kim on 01/26/2022 16:09:40 -------------------------------------------------------------------------------- Lower Extremity Assessment Details Patient Name: Date of Service: [name] [name], [name] [name] 01/26/2022 3:15 PM Medical Record Number: [id number] Patient Account Number: [id number] Date of Birth/Sex: Treating RN: May 01, 1925 (89 y.o. Marlowe Shores Primary Care Angeleigh Chiasson: Dewayne Shorter Other Clinician: ZANI, KYLLONEN ([id number]) [id number] Page 4 of 10 Referring Ercie Eliasen: Treating Martie Fulgham/Extender: Temple Pacini, Jordan Hawks Weeks in Treatment: 6 Edema Assessment Assessed: [Left: Yes] [Right: Yes] Edema: [Left: No] [Right: No] Vascular Assessment Pulses: Dorsalis Pedis Palpable: [Left:Yes] [Right:Yes] Electronic Signature(s) Signed: 01/26/2022 6:11:46 PM By: Gretta Cool, BSN, RN, CWS, Kim RN, BSN Entered By: Gretta Cool, BSN, RN, CWS, Kim on 01/26/2022 15:50:38 -------------------------------------------------------------------------------- Multi Wound Chart Details Patient Name: Date of Service: [name] [name] 01/26/2022 3:15 PM Medical Record Number: [id number] Patient Account Number: [id number] Date of Birth/Sex: Treating RN: 02-20-1925 (89 y.o. Mar[place]e Shores Primary Care Muskaan Smet: Dewayne Shorter Other Clinician: Referring Kolin Erdahl: Treating Yan Pankratz/Extender: Alfonzo Beers Weeks in Treatment: 6 Vital Signs Height(in): 68 Pulse(bpm): 54 Weight(lbs): 150 Blood Pressure(mmHg): 181/68 Body Mass Index(BMI): 22.8 Temperature(F): 98.1 Respiratory Rate(breaths/min): 16 [1:Photos:] [N/A:N/A] Right, Distal, Dorsal, Anterior Foot  Distal, Medial Back N/A Wound Location: Gradually Appeared Gradually Appeared N/A Wounding Event: Medication Related Pressure Ulcer N/A Primary Etiology: 11/15/2021 12/15/2021 N/A Date Acquired: 6 5 N/A Weeks of Treatment: Open Open N/A Wound Status: No No N/A Wound Recurrence: 0.2x1x0.1 1x0.2x0.1 N/A Measurements L x W x D (cm) 0.157 0.157 N/A A (cm) : rea 0.016 0.016 N/A Volume (cm) : 99.70% 98.30% N/A % Reduction in A rea: 99.80% 99.20% N/A % Reduction in Volume: Full Thickness Without Exposed Category/Stage II N/A Classification: Support Structures Medium Medium N/A Exudate Amount: Serosanguineous Serosanguineous N/A Exudate  TypeREDA, CITRON ([id number]) [id number] Page 5 of 10 red, brown red, brown N/A Exudate Color: Treatment Notes Electronic Signature(s) Signed: 01/26/2022 6:11:46 PM By: Gretta Cool, BSN, RN, CWS, Kim RN, BSN Entered By: Gretta Cool, BSN, RN, CWS, Kim on 01/26/2022 15:56:33 -------------------------------------------------------------------------------- Multi-Disciplinary Care Plan Details Patient Name: Date of Service: ARYAHNA, SPAGNA 01/26/2022 3:15 PM Medical Record Number: [id number] Patient Account Number: [id number] Date of Birth/Sex: Treating RN: 06-Nov-1925 (89 y.o. [place]e Sh[place] Primary Care Lonetta Blassingame: Dewayne Shorter Other Clinician: Referring Kaylem Gidney: Treating Cayleigh Paull/Extender: Alfonzo Beers Weeks in Treatment: 6 Active Inactive Necrotic Tissue Nursing Diagnoses: Impaired tissue integrity related to necrotic/devitalized tissue Knowledge deficit related to management of necrotic/devitalized tissue Goals: Necrotic/devitalized tissue will be minimized in the wound bed Date Initiated: 12/13/2021 Target Resolution Date: 01/13/2022 Goal Status: Active Patient/caregiver will verbalize understanding of reason and process for debridement of necrotic tissue Date Initiated: 12/13/2021 Target  Resolution Date: 01/13/2022 Goal Status: Active Interventions: Assess patient pain level pre-, during and post procedure and prior to discharge Provide education on necrotic tissue and debridement process Treatment Activities: Apply topical anesthetic as ordered : 12/13/2021 Excisional debridement : 12/13/2021 Notes: Wound/Skin Impairment Nursing Diagnoses: Impaired tissue integrity Knowledge deficit related to ulceration/compromised skin integrity Goals: Patient will demonstrate a reduced rate of smoking or cessation of smoking Date Initiated: 12/13/2021 Target Resolution Date: 01/13/2022 Goal Status: Active Patient will have a decrease in wound volume by X% from date: (specify in notes) Date Initiated: 12/13/2021 Target Resolution Date: 01/13/2022 Goal Status: Active Patient/caregiver will verbalize understanding of skin care regimen Date Initiated: 12/13/2021 Target Resolution Date: 01/13/2022 Goal Status: Active EVERETTE, DIMAURO ([id number]) [id number] Page 6 of 10 Ulcer/skin breakdown will have a volume reduction of 30% by week 4 Date Initiated: 12/13/2021 Target Resolution Date: 01/13/2022 Goal Status: Active Ulcer/skin breakdown will have a volume reduction of 50% by week 8 Date Initiated: 12/13/2021 Target Resolution Date: 02/13/2022 Goal Status: Active Ulcer/skin breakdown will have a volume reduction of 80% by week 12 Date Initiated: 12/13/2021 Target Resolution Date: 03/14/2022 Goal Status: Active Ulcer/skin breakdown will heal within 14 weeks Date Initiated: 12/13/2021 Target Resolution Date: 03/28/2022 Goal Status: Active Interventions: Assess patient/caregiver ability to obtain necessary supplies Assess patient/caregiver ability to perform ulcer/skin care regimen upon admission and as needed Assess ulceration(s) every visit Provide education on ulcer and skin care Treatment Activities: Skin care regimen initiated : 12/13/2021 Topical wound management  initiated : 12/13/2021 Notes: Electronic Signature(s) Signed: 01/26/2022 6:11:46 PM By: Gretta Cool, BSN, RN, CWS, Kim RN, BSN Entered By: Gretta Cool, BSN, RN, CWS, Kim on 01/26/2022 16:09:09 -------------------------------------------------------------------------------- Pain Assessment Details Patient Name: Date of Service: [name] [name] 01/26/2022 3:15 PM Medical Record Number: [id number] Patient Account Number: [id number] Date of Birth/Sex: Treating RN: Aug 28, 1925 (89 y.o. [place]e Sh[place] Primary Care Beryl Balz: Dewayne Shorter Other Clinician: Referring Soham Hollett: Treating Keaira Whitehurst/Extender: Alfonzo Beers Weeks in Treatment: 6 Active Problems Location of Pain Severity and Description of Pain Patient Has Paino Yes Site Locations Pain Location: Generalized Pain BUENA, BOEHM ([id number]) [id number].pdf Page 7 of 10 Pain Management and Medication Current Pain Management: Notes Jaw and arthritis not wounds Electronic Signature(s) Signed: 01/26/2022 6:11:46 PM By: Gretta Cool, BSN, RN, CWS, Kim RN, BSN Entered By: Gretta Cool, BSN, RN, CWS, Kim on 01/26/2022 15:42:43 -------------------------------------------------------------------------------- Patient/Caregiver Education Details Patient Name: Date of Service: [name] [name] 1/18/2024andnbsp3:15 PM Medical Record Number: [id number] Patient Account Number: [id number] Date of Birth/Gender: Treating RN: 1925/03/06 (89 y.o. Mar[place]e Sh[place] Primary Care Physician: Dewayne Shorter Other  Clinician: Referring Physician: Treating Physician/Extender: Iris Pert in Treatment: 6 Education Assessment Education Provided To: Patient Education Topics Provided Wound/Skin Impairment: Handouts: Caring for Your Ulcer Methods: Demonstration, Explain/Verbal Responses: State content correctly Electronic Signature(s) Signed: 01/26/2022 6:11:46 PM By: Gretta Cool, BSN, RN, CWS, Kim RN, BSN Entered By:  Gretta Cool, BSN, RN, CWS, Kim on 01/26/2022 16:09:04 -------------------------------------------------------------------------------- Wound Assessment Details Patient Name: Date of Service: [name] [name], [name] [name] 01/26/2022 3:15 PM Medical Record Number: [id number] Patient Account Number: [id number] Date of Birth/Sex: Treating RN: 10-31-1925 (89 y.o. Marlowe Shores Primary Care Palmyra Rogacki: Dewayne Shorter Other Clinician: Referring Divine Hansley: Treating Katelyne Galster/Extender: Iris Pert in Treatment: [address], [place] ([id number]) [id number] Page 8 of 10 Wound Status Wound Number: 1 Primary Etiology: Medication Related Wound Location: Right, Distal, Dorsal, Anterior Foot Wound Status: Open Wounding Event: Gradually Appeared Date Acquired: 11/15/2021 Weeks Of Treatment: 6 Clustered Wound: No Photos Photo Uploaded By: Gretta Cool, BSN, RN, CWS, Kim on 01/26/2022 15:56:16 Wound Measurements Length: (cm) 0.2 Width: (cm) 1 Depth: (cm) 0.1 Area: (cm) 0.157 Volume: (cm) 0.016 % Reduction in Area: 99.7% % Reduction in Volume: 99.8% Wound Description Classification: Full Thickness Without Exposed Support Exudate Amount: Medium Exudate Type: Serosanguineous Exudate Color: red, brown Structures Treatment Notes Wound #1 (Foot) Wound Laterality: Dorsal, Right, Anterior, Distal Cleanser Soap and Water Discharge Instruction: Gently cleanse wound with antibacterial soap, rinse and pat dry prior to dressing wounds Peri-Wound Care Topical Primary Dressing Xeroform 4x4-HBD (in/in) Discharge Instruction: Apply Xeroform 4x4-HBD (in/in) as directed Secondary Dressing (BORDER) Zetuvit Plus SILICONE BORDER Dressing 5x5 (in/in) Discharge Instruction: Please do not put silicone bordered dressings under wraps. Use non-bordered dressing only. Secured With Compression Wrap Compression Stockings Environmental education officer) Signed: 01/26/2022 6:11:46 PM By:  Gretta Cool, BSN, RN, CWS, Kim RN, BSN Entered By: Gretta Cool, BSN, RN, CWS, Kim on 01/26/2022 15:50:05 [name] [name] ([id number]) [id number].pdf Page 9 of 10 -------------------------------------------------------------------------------- Wound Assessment Details Patient Name: Date of Service: [name] [name], [name] [name] 01/26/2022 3:15 PM Medical Record Number: [id number] Patient Account Number: [id number] Date of Birth/Sex: Treating RN: 10/15/1925 (89 y.o. Charolette Forward, Kim Primary Care Brason Berthelot: Dewayne Shorter Other Clinician: Referring Tavarion Babington: Treating Rashell Shambaugh/Extender: Alfonzo Beers Weeks in Treatment: 6 Wound Status Wound Number: 8 Primary Etiology: Pressure Ulcer Wound Location: Distal, Medial Back Wound Status: Open Wounding Event: Gradually Appeared Date Acquired: 12/15/2021 Weeks Of Treatment: 5 Clustered Wound: No Photos Photo Uploaded By: Gretta Cool, BSN, RN, CWS, Kim on 01/26/2022 15:56:16 Wound Measurements Length: (cm) 1 Width: (cm) 0.2 Depth: (cm) 0.1 Area: (cm) 0.157 Volume: (cm) 0.016 % Reduction in Area: 98.3% % Reduction in Volume: 99.2% Wound Description Classification: Category/Stage II Exudate Amount: Medium Exudate Type: Serosanguineous Exudate Color: red, brown Treatment Notes Wound #8 (Back) Wound Laterality: Medial, Distal Cleanser Peri-Wound Care AandD Ointment Discharge Instruction: Apply AandD Ointment as directed Topical Primary Dressing Secondary Dressing Secured With Compression KATELAN, HIRT ([id number]) [id number] Page 10 of 10 Compression Stockings Add-Ons Electronic Signature(s) Signed: 01/26/2022 6:11:46 PM By: Gretta Cool, BSN, RN, CWS, Kim RN, BSN Entered By: Gretta Cool, BSN, RN, CWS, Kim on 01/26/2022 15:50:05 -------------------------------------------------------------------------------- Vitals Details Patient Name: Date of Service: [name] [name] 01/26/2022 3:15 PM Medical  Record Number: [id number] Patient Account Number: [id number] Date of Birth/Sex: Treating RN: 03/13/1925 (89 y.o. [place]e Sh[place] Primary Care Dewaine Morocho: Dewayne Shorter Other Clinician: Referring Rudransh Bellanca: Treating Troyce Gieske/Extender: Alfonzo Beers Weeks in Treatment: 6 Vital Signs Time Taken: 15:41 Temperature (F): 98.1 Height (in): 68 Pulse (bpm): 81 Weight (lbs):  150 Respiratory Rate (breaths/min): 16 Body Mass Index (BMI): 22.8 Blood Pressure (mmHg): 181/68 Reference Range: 80 - 120 mg / dl Electronic Signature(s) Signed: 01/26/2022 6:11:46 PM By: Gretta Cool, BSN, RN, CWS, Kim RN, BSN Entered By: Gretta Cool, BSN, RN, CWS, Kim on 01/26/2022 15:41:58

## 2022-02-09 ENCOUNTER — Encounter: Payer: Medicare Other | Attending: Physician Assistant | Admitting: Physician Assistant

## 2022-02-09 DIAGNOSIS — Z923 Personal history of irradiation: Secondary | ICD-10-CM | POA: Insufficient documentation

## 2022-02-09 DIAGNOSIS — C069 Malignant neoplasm of mouth, unspecified: Secondary | ICD-10-CM | POA: Insufficient documentation

## 2022-02-09 DIAGNOSIS — L89893 Pressure ulcer of other site, stage 3: Secondary | ICD-10-CM | POA: Insufficient documentation

## 2022-02-09 DIAGNOSIS — L97822 Non-pressure chronic ulcer of other part of left lower leg with fat layer exposed: Secondary | ICD-10-CM | POA: Diagnosis not present

## 2022-02-09 DIAGNOSIS — L988 Other specified disorders of the skin and subcutaneous tissue: Secondary | ICD-10-CM | POA: Insufficient documentation

## 2022-02-09 DIAGNOSIS — J449 Chronic obstructive pulmonary disease, unspecified: Secondary | ICD-10-CM | POA: Insufficient documentation

## 2022-02-09 DIAGNOSIS — D638 Anemia in other chronic diseases classified elsewhere: Secondary | ICD-10-CM | POA: Diagnosis not present

## 2022-02-09 DIAGNOSIS — L97812 Non-pressure chronic ulcer of other part of right lower leg with fat layer exposed: Secondary | ICD-10-CM | POA: Insufficient documentation

## 2022-02-09 DIAGNOSIS — Z7960 Long term (current) use of unspecified immunomodulators and immunosuppressants: Secondary | ICD-10-CM | POA: Diagnosis not present
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
## 2022-02-09 NOTE — Progress Notes (Addendum)
TAMECCA, ARTIGA ([id number]) [id number] Page 1 of 6 Visit Report for 02/09/2022 Chief Complaint Document Details Patient Name: Date of Service: [name], [name] 02/09/2022 1:15 PM Medical Record Number: [id number] Patient Account Number: [id number] Date of Birth/Sex: Treating RN: May 08, 1925 (89 y.o. [name] [name] Primary Care Provider: Dewayne Shorter Other Clinician: Referring Provider: Treating Provider/Extender: Iris Pert in Treatment: 8 Information Obtained from: Patient Chief Complaint Bilateral LE Ulcers Electronic Signature([name]) Signed: 02/09/2022 1:10:26 PM By: Worthy Keeler PA-C Entered By: Worthy Keeler on 02/09/2022 13:10:26 -------------------------------------------------------------------------------- HPI Details Patient Name: Date of Service: [name], [name] 02/09/2022 1:15 PM Medical Record Number: [id number] Patient Account Number: [id number] Date of Birth/Sex: Treating RN: [birth year] (89 y.o. [name] [name] Primary Care Provider: Dewayne Shorter Other Clinician: Referring Provider: Treating Provider/Extender: Temple Pacini, Susanne Greenhouse in Treatment: 8 History of Present Illness HPI Description: 12-13-2021 patient presents today for initial inspection here in our clinic as a referral from [facility] regarding issues that she has been having with her feet although it is really not just her feet but her legs as well as her hands and her back and her thighs as well. This appears to have been a reaction from the chemotherapy as best we can tell. With that being said fortunately she seems to be getting better for the most part across the board including her feet although she still has a significant wound on the right foot and the feet in general still have a lot of very dry skin remaining at this point. Fortunately there does not appear to be any signs of infection locally nor systemically which is great  news. She is seen with her son-in-law here in the clinic today. Patient does have a history of COPD, hypertension, anemia of chronic disease, and she does have mouth cancer as well that is what she has been undergoing chemotherapy and radiation for. 12-22-2021 upon evaluation today patient appears to be doing well currently in regard to her wounds. In fact a lot of them are completely healed as of today. She also has a new wound opened up on her back unfortunately this appears to be more of a pressure injury at this point. I am concerned more about that but I am anything else to be perfectly honest. 12-30-2021 upon evaluation today patient appears to be doing well currently in regard to her wounds. The only thing that is left open at this point is the top of her right foot which is significantly smaller and her back also significantly smaller and looking much better. In general I am actually very pleased with where we stand today. [name], [name] ([id number]) [id number] Page 2 of 6 01-13-2022 upon evaluation today patient appears to be doing well currently in regard to both wounds on her right foot as well as her back. She has been tolerating the dressing changes without complication. Fortunately there does not appear to be any signs of active infection at this time. No fevers, chills, nausea, vomiting, or diarrhea. 01-26-2022 upon evaluation today patient appears to be doing well currently in regard to her wound. She has been tolerating the dressing changes without complication. Fortunately there does not appear to be any evidence of active infection locally nor systemically which is great news. No fevers, chills, nausea, vomiting, or diarrhea. Her wounds actually are showing signs of excellent improvement both the foot as well as the back are almost completely healed. 02-09-2022 upon evaluation today patient appears to be doing  well currently without any signs of active  infection locally or systemically at this time which is great news. No fevers, chills, nausea, vomiting, or diarrhea. Electronic Signature([name]) Signed: 02/09/2022 2:07:48 PM By: Worthy Keeler PA-C Entered By: Worthy Keeler on 02/09/2022 14:07:48 -------------------------------------------------------------------------------- Physical Exam Details Patient Name: Date of Service: [name], [name] 02/09/2022 1:15 PM Medical Record Number: [id number] Patient Account Number: [id number] Date of Birth/Sex: Treating RN: 04-Sep-1925 (89 y.o. [name] [name] Primary Care Provider: Dewayne Shorter Other Clinician: Referring Provider: Treating Provider/Extender: Alfonzo Beers Weeks in Treatment: 8 Constitutional Well-nourished and well-hydrated in no acute distress. Respiratory normal breathing without difficulty. Psychiatric this patient is able to make decisions and demonstrates good insight into disease process. Alert and Oriented x 3. pleasant and cooperative. Notes Upon inspection patient'[name] wounds both showed signs of complete epithelization and overall I am extremely pleased with where we stand today. I do not feel like there is any evidence of an opening or anything worsening and I think at this point she can shower normally also think this wearing a sock and normal shoe is fine as well. Electronic Signature([name]) Signed: 02/09/2022 2:08:06 PM By: Worthy Keeler PA-C Entered By: Worthy Keeler on 02/09/2022 14:08:06 -------------------------------------------------------------------------------- Physician Orders Details Patient Name: Date of Service: [name], [name] 02/09/2022 1:15 PM Medical Record Number: [id number] Patient Account Number: [id number] Date of Birth/Sex: Treating RN: June 17, 1925 (89 y.o. [name] [name] Primary Care Provider: Dewayne Shorter Other Clinician: DALEXA, GENTZ ([id number]) [id number] Page 3 of 6 Referring  Provider: Treating Provider/Extender: Temple Pacini, Susanne Greenhouse in Treatment: 8 Verbal / Phone Orders: No Diagnosis Coding ICD-10 Coding Code Description L98.8 Other specified disorders of the skin and subcutaneous tissue L97.822 Non-pressure chronic ulcer of other part of left lower leg with fat layer exposed L97.812 Non-pressure chronic ulcer of other part of right lower leg with fat layer exposed L89.893 Pressure ulcer of other site, stage 3 J44.9 Chronic obstructive pulmonary disease, unspecified I10 Essential (primary) hypertension D63.8 Anemia in other chronic diseases classified elsewhere C06.9 Malignant neoplasm of mouth, unspecified Discharge From [facility] Discharge from [facility] Complete Follow-up Appointments Other: - Call if needed Electronic Signature([name]) Signed: 02/09/2022 4:41:53 PM By: Worthy Keeler PA-C Signed: 02/10/2022 1:21:58 PM By: Rosalio Loud MSN RN CNS WTA Entered By: Rosalio Loud on 02/09/2022 13:54:33 -------------------------------------------------------------------------------- Problem List Details Patient Name: Date of Service: [name] [name] 02/09/2022 1:15 PM Medical Record Number: [id number] Patient Account Number: [id number] Date of Birth/Sex: Treating RN: [birth year] (89 y.o. [name] [name] Primary Care Provider: Dewayne Shorter Other Clinician: Referring Provider: Treating Provider/Extender: Alfonzo Beers Weeks in Treatment: 8 Active Problems ICD-10 Encounter Code Description Active Date MDM Diagnosis L98.8 Other specified disorders of the skin and subcutaneous tissue 12/13/2021 No Yes L97.822 Non-pressure chronic ulcer of other part of left lower leg with fat layer exposed12/05/2021 No Yes L97.812 Non-pressure chronic ulcer of other part of right lower leg with fat layer 12/13/2021 No Yes exposed L89.893 Pressure ulcer of other site, stage 3 12/22/2021 No Yes [name] [name], [name] [name] ([id number])  [id number] Page 4 of 6 J44.9 Chronic obstructive pulmonary disease, unspecified 12/13/2021 No Yes I10 Essential (primary) hypertension 12/13/2021 No Yes D63.8 Anemia in other chronic diseases classified elsewhere 12/13/2021 No Yes C06.9 Malignant neoplasm of mouth, unspecified 12/13/2021 No Yes Inactive Problems Resolved Problems Electronic Signature([name]) Signed: 02/09/2022 1:10:22 PM By: Worthy Keeler PA-C Entered By: Worthy Keeler on 02/09/2022 13:10:22 -------------------------------------------------------------------------------- Progress Note  Details Patient Name: Date of Service: [name], [name] 02/09/2022 1:15 PM Medical Record Number: [id number] Patient Account Number: [id number] Date of Birth/Sex: Treating RN: 12/08/1925 (89 y.o. [name] [name] Primary Care Provider: Dewayne Shorter Other Clinician: Referring Provider: Treating Provider/Extender: Alfonzo Beers Weeks in Treatment: 8 Subjective Chief Complaint Information obtained from Patient Bilateral LE Ulcers History of Present Illness (HPI) 12-13-2021 patient presents today for initial inspection here in our clinic as a referral from [facility] regarding issues that she has been having with her feet although it is really not just her feet but her legs as well as her hands and her back and her thighs as well. This appears to have been a reaction from the chemotherapy as best we can tell. With that being said fortunately she seems to be getting better for the most part across the board including her feet although she still has a significant wound on the right foot and the feet in general still have a lot of very dry skin remaining at this point. Fortunately there does not appear to be any signs of infection locally nor systemically which is great news. She is seen with her son-in-law here in the clinic today. Patient does have a history of COPD, hypertension, anemia of chronic  disease, and she does have mouth cancer as well that is what she has been undergoing chemotherapy and radiation for. 12-22-2021 upon evaluation today patient appears to be doing well currently in regard to her wounds. In fact a lot of them are completely healed as of today. She also has a new wound opened up on her back unfortunately this appears to be more of a pressure injury at this point. I am concerned more about that but I am anything else to be perfectly honest. 12-30-2021 upon evaluation today patient appears to be doing well currently in regard to her wounds. The only thing that is left open at this point is the top of her right foot which is significantly smaller and her back also significantly smaller and looking much better. In general I am actually very pleased with where we stand today. 01-13-2022 upon evaluation today patient appears to be doing well currently in regard to both wounds on her right foot as well as her back. She has been tolerating the dressing changes without complication. Fortunately there does not appear to be any signs of active infection at this time. No fevers, chills, nausea, vomiting, or diarrhea. 01-26-2022 upon evaluation today patient appears to be doing well currently in regard to her wound. She has been tolerating the dressing changes without complication. Fortunately there does not appear to be any evidence of active infection locally nor systemically which is great news. No fevers, chills, nausea, [name] [name], [name] [name] ([id number]) [id number] Page 5 of 6 vomiting, or diarrhea. Her wounds actually are showing signs of excellent improvement both the foot as well as the back are almost completely healed. 02-09-2022 upon evaluation today patient appears to be doing well currently without any signs of active infection locally or systemically at this time which is great news. No fevers, chills, nausea, vomiting, or  diarrhea. Objective Constitutional Well-nourished and well-hydrated in no acute distress. Vitals Time Taken: 1:38 PM, Height: 68 in, Weight: 150 lbs, BMI: 22.8, Temperature: 98.2 F, Pulse: 69 bpm, Respiratory Rate: 16 breaths/min, Blood Pressure: 146/62 mmHg. Respiratory normal breathing without difficulty. Psychiatric this patient is able to make decisions and demonstrates good insight into disease process. Alert and Oriented x 3.  pleasant and cooperative. General Notes: Upon inspection patient'[name] wounds both showed signs of complete epithelization and overall I am extremely pleased with where we stand today. I do not feel like there is any evidence of an opening or anything worsening and I think at this point she can shower normally also think this wearing a sock and normal shoe is fine as well. Integumentary (Hair, Skin) Wound #1 status is Open. Original cause of wound was Gradually Appeared. The date acquired was: 11/15/2021. The wound has been in treatment 8 weeks. The wound is located on the [place]. The wound measures 0cm length x 0cm width x 0cm depth; 0cm^2 area and 0cm^3 volume. There is a medium amount of serosanguineous drainage noted. Wound #8 status is Healed - Epithelialized. Original cause of wound was Gradually Appeared. The date acquired was: 12/15/2021. The wound has been in treatment 7 weeks. The wound is located on the Distal,Medial Back. The wound measures 0cm length x 0cm width x 0cm depth; 0cm^2 area and 0cm^3 volume. There is a medium amount of serosanguineous drainage noted. Assessment Active Problems ICD-10 Other specified disorders of the skin and subcutaneous tissue Non-pressure chronic ulcer of other part of left lower leg with fat layer exposed Non-pressure chronic ulcer of other part of right lower leg with fat layer exposed Pressure ulcer of other site, stage 3 Chronic obstructive pulmonary disease, unspecified Essential (primary)  hypertension Anemia in other chronic diseases classified elsewhere Malignant neoplasm of mouth, unspecified Plan Discharge From [facility] Services: Discharge from [facility] Complete Follow-up Appointments: Other: - Call if needed 1. I am recommend currently for her back that she would be helped by getting a memory foam cushion and cutting the center out up-and-down length ways in order to make room for her spine so that it would not show anything when she is sitting. The patient voiced understanding and her son-in-law who helps take care of her is going to look into what he can get as well. 2. I am going to recommend continued AandD ointment as needed but right now she really seems to be doing quite well. We will see the patient back for follow-up visit as needed. Electronic Signature([name]) Signed: 02/09/2022 2:09:54 PM By: Worthy Keeler PA-C Entered By: Worthy Keeler on 02/09/2022 14:09:53 [name] [name] ([id number]) [id number] Page 6 of 6 -------------------------------------------------------------------------------- SuperBill Details Patient Name: Date of Service: [name], [name] 02/09/2022 Medical Record Number: [id number] Patient Account Number: [id number] Date of Birth/Sex: Treating RN: Jan 13, 1925 (89 y.o. [name] [name] Primary Care Provider: Dewayne Shorter Other Clinician: Referring Provider: Treating Provider/Extender: Alfonzo Beers Weeks in Treatment: 8 Diagnosis Coding ICD-10 Codes Code Description L98.8 Other specified disorders of the skin and subcutaneous tissue L97.822 Non-pressure chronic ulcer of other part of left lower leg with fat layer exposed L97.812 Non-pressure chronic ulcer of other part of right lower leg with fat layer exposed L89.893 Pressure ulcer of other site, stage 3 J44.9 Chronic obstructive pulmonary disease, unspecified I10 Essential (primary) hypertension D63.8 Anemia in other chronic  diseases classified elsewhere C06.9 Malignant neoplasm of mouth, unspecified Facility Procedures : CPT4 Code: [id number] Description: 97989 - WOUND CARE VISIT-LEV 2 EST PT Modifier: Quantity: 1 Physician Procedures : CPT4 Code Description Modifier [id number] 99213 - WC PHYS LEVEL 3 - EST PT ICD-10 Diagnosis Description L98.8 Other specified disorders of the skin and subcutaneous tissue L97.822 Non-pressure chronic ulcer of other part of left lower leg with fat layer  exposed L97.812 Non-pressure chronic  ulcer of other part of right lower leg with fat layer exposed L89.893 Pressure ulcer of other site, stage 3 Quantity: 1 Electronic Signature([name]) Signed: 02/09/2022 2:14:42 PM By: Worthy Keeler PA-C Entered By: Worthy Keeler on 02/09/2022 14:14:41

## 2022-02-16 ENCOUNTER — Other Ambulatory Visit: Payer: Medicare Other | Admitting: Hospice

## 2022-02-16 DIAGNOSIS — C060 Malignant neoplasm of cheek mucosa: Secondary | ICD-10-CM

## 2022-02-16 DIAGNOSIS — I10 Essential (primary) hypertension: Secondary | ICD-10-CM

## 2022-02-16 DIAGNOSIS — R52 Pain, unspecified: Secondary | ICD-10-CM

## 2022-02-16 DIAGNOSIS — Z515 Encounter for palliative care: Secondary | ICD-10-CM

## 2022-02-16 DIAGNOSIS — J449 Chronic obstructive pulmonary disease, unspecified: Secondary | ICD-10-CM

## 2022-02-16 NOTE — Progress Notes (Signed)
[facility] Pall[facility] Consult Note Telephone: [phone]  Fax: [phone]    Date of encounter: 02/16/22 10:23 AM PATIENT NAME: [name] [name] [address] [address]   [phone] (home)  DOB: 10-May-1925 MRN: [id number] PRIMARY CARE PROVIDER:    Dewayne Shorter, MD,  [place] [address] [phone]  REFERRING PROVIDER:   Dewayne Shorter, MD [place],  [place] [address] [phone]  RESPONSIBLE PARTYBufford Buttner: [phone] [name]: [place]     Name Relation Home Work Ali Molina Relative [phone]  [phone]   [name],[name] Son [phone]  [phone]   [place]   [phone]   ratliff,lynn Friend   [phone]        I met face to face with patient in her home. Palliative Care was asked to follow this patient by consultation request of  Dewayne Shorter, MD to address advance care planning and complex medical decision making.  Caregiver [name]  and son in law [name] are with patient during visit.                                    ASSESSMENT AND PLAN / RECOMMENDATIONS:   Advance Care Planning: Our advance care planning conversation included a discussion about:    The value and importance of advance care planning  Experiences with loved ones who have been seriously ill or have died  Exploration of personal, cultural or spiritual beliefs that might influence medical decisions  Exploration of goals of care in the event of a sudden injury or illness  Identification  of a healthcare agent  Review and updating or creation of an  advance directive document . Decision not to resuscitate or to de-escalate disease focused treatments due to poor prognosis. CODE STATUS: Patient is a DO NOT RESUSCITATE.  MOST form selections include limited additional interventions,  no feeding tube, IV fluid and  anitbiotics if indicated.  Patient is interested in hospice service in the future.  I spent 20 minutes providing this palliative consultation. More than 50% of the time in this consultation was spent in counseling and care coordination. 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  Supportive care only. Weakness: likely worsened with recent buccal cancer treatments. PT/OT is ongoing for strengthening and ambulation.   Pain: Occasional pain in left jaw related to buccal cancer managed with Oxycodone, Gabapentin. Hypertension: Managed with Losartan.  COPD: no recent exacerbation. Followed by Pulmonologist. Continue Azythromycin as ordered and breathing treatments as ordered. In no  respiratory distress, oxygen saturation 96% room air.    Follow up Palliative Care Visit: Palliative care will continue to follow for complex medical decision making, advance care planning, and clarification of goals. Return in 6 weeks or prn.  PPS: 50%  HOSPICE ELIGIBILITY/DIAGNOSIS: TBD  Chief Complaint: Palliative Medicine initial visit.   HISTORY OF PRESENT ILLNESS:  [name] [name] is a 89 y.o. year old female  with  SCC of buccal mucosa, hx of radiation to head and neck 11/2020; anemia, hypertension, COPD, GERD, bil lower leg cellulitis. Hx of GI bleed, peripheral neuropathy, Meniere's disease, arthritis, DDD, fibromyalgia,  depression.  Patient was premedicated with her pain medication, denies pain/discomfort during visit.  In no acute distress.  History obtained from review of EMR, discussion with primary team, and interview with family, facility staff/caregiver and/or [name] [name].  I reviewed available labs, medications, imaging, studies and related documents from the EMR.  Records  reviewed and summarized above.  Physical Exam: In no acute distress, hard of hearing EYES: anicteric sclera, lids intact, no discharge, self-reports blindness CV: S1S2, RRR, no LE edema Pulmonary:  no increased work of breathing, no cough, room air Abdomen: normo-active BS + 4 quadrants, soft and non tender MSK: moves all extremities, ambulatory with rolling walker Skin: warm and dry, no rashes or wounds on visible skin Neuro: alert,  generalized weakness.  Psych: non-anxious affect, A and O x 3 Hem/lymph/immuno: no widespread bruising   Thank you for the opportunity to participate in the care of [name] [name] Please call our office at [phone] if we can be of additional assistance.   Note: Portions of this note were generated with L[employer]. Dictation errors may occur despite best attempts at proofreading.   Teodoro Spray, NP

## 2022-02-17 ENCOUNTER — Ambulatory Visit: Payer: Medicare Other | Admitting: Podiatry

## 2022-02-21 ENCOUNTER — Telehealth: Payer: Self-pay | Admitting: Hospice

## 2022-02-21 ENCOUNTER — Other Ambulatory Visit: Payer: Self-pay

## 2022-02-21 ENCOUNTER — Inpatient Hospital Stay
Admission: EM | Admit: 2022-02-21 | Discharge: 2022-02-23 | DRG: 392 | Disposition: A | Discharge status: Hospice | Payer: Medicare Other | Attending: Internal Medicine | Admitting: Internal Medicine

## 2022-02-21 ENCOUNTER — Inpatient Hospital Stay: Payer: Medicare Other

## 2022-02-21 ENCOUNTER — Encounter: Payer: Self-pay | Admitting: Oncology

## 2022-02-21 ENCOUNTER — Encounter: Payer: Self-pay | Admitting: Radiology

## 2022-02-21 ENCOUNTER — Emergency Department: Payer: Medicare Other

## 2022-02-21 DIAGNOSIS — Z8589 Personal history of malignant neoplasm of other organs and systems: Secondary | ICD-10-CM

## 2022-02-21 DIAGNOSIS — N823 Fistula of vagina to large intestine: Secondary | ICD-10-CM | POA: Diagnosis present

## 2022-02-21 DIAGNOSIS — Z9221 Personal history of antineoplastic chemotherapy: Secondary | ICD-10-CM | POA: Diagnosis not present

## 2022-02-21 DIAGNOSIS — F32A Depression, unspecified: Secondary | ICD-10-CM | POA: Diagnosis present

## 2022-02-21 DIAGNOSIS — Z923 Personal history of irradiation: Secondary | ICD-10-CM | POA: Diagnosis not present

## 2022-02-21 DIAGNOSIS — Z85828 Personal history of other malignant neoplasm of skin: Secondary | ICD-10-CM

## 2022-02-21 DIAGNOSIS — K838 Other specified diseases of biliary tract: Secondary | ICD-10-CM | POA: Diagnosis present

## 2022-02-21 DIAGNOSIS — K631 Perforation of intestine (nontraumatic): Secondary | ICD-10-CM | POA: Insufficient documentation

## 2022-02-21 DIAGNOSIS — J449 Chronic obstructive pulmonary disease, unspecified: Secondary | ICD-10-CM

## 2022-02-21 DIAGNOSIS — Z79899 Other long term (current) drug therapy: Secondary | ICD-10-CM

## 2022-02-21 DIAGNOSIS — Z515 Encounter for palliative care: Secondary | ICD-10-CM

## 2022-02-21 DIAGNOSIS — H409 Unspecified glaucoma: Secondary | ICD-10-CM | POA: Diagnosis present

## 2022-02-21 DIAGNOSIS — Z1152 Encounter for screening for COVID-19: Secondary | ICD-10-CM

## 2022-02-21 DIAGNOSIS — Z8249 Family history of ischemic heart disease and other diseases of the circulatory system: Secondary | ICD-10-CM | POA: Diagnosis not present

## 2022-02-21 DIAGNOSIS — Z90710 Acquired absence of both cervix and uterus: Secondary | ICD-10-CM | POA: Diagnosis not present

## 2022-02-21 DIAGNOSIS — E871 Hypo-osmolality and hyponatremia: Secondary | ICD-10-CM | POA: Diagnosis present

## 2022-02-21 DIAGNOSIS — K5720 Diverticulitis of large intestine with perforation and abscess without bleeding: Secondary | ICD-10-CM | POA: Diagnosis not present

## 2022-02-21 DIAGNOSIS — K219 Gastro-esophageal reflux disease without esophagitis: Secondary | ICD-10-CM | POA: Diagnosis present

## 2022-02-21 DIAGNOSIS — I10 Essential (primary) hypertension: Secondary | ICD-10-CM

## 2022-02-21 DIAGNOSIS — D72829 Elevated white blood cell count, unspecified: Secondary | ICD-10-CM | POA: Diagnosis present

## 2022-02-21 DIAGNOSIS — E785 Hyperlipidemia, unspecified: Secondary | ICD-10-CM | POA: Diagnosis present

## 2022-02-21 DIAGNOSIS — H9190 Unspecified hearing loss, unspecified ear: Secondary | ICD-10-CM | POA: Diagnosis present

## 2022-02-21 DIAGNOSIS — Z888 Allergy status to other drugs, medicaments and biological substances status: Secondary | ICD-10-CM | POA: Diagnosis not present

## 2022-02-21 DIAGNOSIS — D500 Iron deficiency anemia secondary to blood loss (chronic): Secondary | ICD-10-CM | POA: Diagnosis not present

## 2022-02-21 DIAGNOSIS — Z87891 Personal history of nicotine dependence: Secondary | ICD-10-CM

## 2022-02-21 DIAGNOSIS — Z885 Allergy status to narcotic agent status: Secondary | ICD-10-CM | POA: Diagnosis not present

## 2022-02-21 DIAGNOSIS — Z5329 Procedure and treatment not carried out because of patient's decision for other reasons: Secondary | ICD-10-CM | POA: Diagnosis present

## 2022-02-21 DIAGNOSIS — Z66 Do not resuscitate: Secondary | ICD-10-CM | POA: Diagnosis present

## 2022-02-21 LAB — URINALYSIS, ROUTINE W REFLEX MICROSCOPIC
Bilirubin Urine: NEGATIVE
Glucose, UA: NEGATIVE mg/dL
Hgb urine dipstick: NEGATIVE
Ketones, ur: NEGATIVE mg/dL
Leukocytes,Ua: NEGATIVE
Nitrite: NEGATIVE
Protein, ur: NEGATIVE mg/dL
Specific Gravity, Urine: 1.021 (ref 1.005–1.030)
pH: 7 (ref 5.0–8.0)

## 2022-02-21 LAB — COMPREHENSIVE METABOLIC PANEL
ALT: 8 U/L (ref 0–44)
AST: 14 U/L — ABNORMAL LOW (ref 15–41)
Albumin: 3 g/dL — ABNORMAL LOW (ref 3.5–5.0)
Alkaline Phosphatase: 66 U/L (ref 38–126)
Anion gap: 11 (ref 5–15)
BUN: 11 mg/dL (ref 8–23)
CO2: 27 mmol/L (ref 22–32)
Calcium: 8.8 mg/dL — ABNORMAL LOW (ref 8.9–10.3)
Chloride: 93 mmol/L — ABNORMAL LOW (ref 98–111)
Creatinine, Ser: 0.52 mg/dL (ref 0.44–1.00)
GFR, Estimated: >60 mL/min (ref >60)
Glucose, Bld: 101 mg/dL — ABNORMAL HIGH (ref 70–99)
Potassium: 3.4 mmol/L — ABNORMAL LOW (ref 3.5–5.1)
Sodium: 131 mmol/L — ABNORMAL LOW (ref 135–145)
Total Bilirubin: 0.5 mg/dL (ref 0.3–1.2)
Total Protein: 7.2 g/dL (ref 6.5–8.1)

## 2022-02-21 LAB — CBC WITH DIFFERENTIAL/PLATELET
Abs Immature Granulocytes: 0.1 10*3/uL — ABNORMAL HIGH (ref 0.00–0.07)
Basophils Absolute: 0.1 10*3/uL (ref 0.0–0.1)
Basophils Relative: 1 %
Eosinophils Absolute: 0 10*3/uL (ref 0.0–0.5)
Eosinophils Relative: 0 %
HCT: 30.2 % — ABNORMAL LOW (ref 36.0–46.0)
Hemoglobin: 9.9 g/dL — ABNORMAL LOW (ref 12.0–15.0)
Immature Granulocytes: 1 %
Lymphocytes Relative: 7 %
Lymphs Abs: 1.2 10*3/uL (ref 0.7–4.0)
MCH: 26.8 pg (ref 26.0–34.0)
MCHC: 32.8 g/dL (ref 30.0–36.0)
MCV: 81.6 fL (ref 80.0–100.0)
Monocytes Absolute: 1.6 10*3/uL — ABNORMAL HIGH (ref 0.1–1.0)
Monocytes Relative: 9 %
Neutro Abs: 14.4 10*3/uL — ABNORMAL HIGH (ref 1.7–7.7)
Neutrophils Relative %: 82 %
Platelets: 544 10*3/uL — ABNORMAL HIGH (ref 150–400)
RBC: 3.7 MIL/uL — ABNORMAL LOW (ref 3.87–5.11)
RDW: 15.9 % — ABNORMAL HIGH (ref 11.5–15.5)
WBC: 17.4 10*3/uL — ABNORMAL HIGH (ref 4.0–10.5)
nRBC: 0 % (ref 0.0–0.2)

## 2022-02-21 LAB — LIPASE, BLOOD: Lipase: 30 U/L (ref 11–51)

## 2022-02-21 LAB — RESP PANEL BY RT-PCR (RSV, FLU A&B, COVID)  RVPGX2
Influenza A by PCR: NEGATIVE
Influenza B by PCR: NEGATIVE
Resp Syncytial Virus by PCR: NEGATIVE
SARS Coronavirus 2 by RT PCR: NEGATIVE

## 2022-02-21 LAB — TROPONIN I (HIGH SENSITIVITY)
Troponin I (High Sensitivity): 7 ng/L (ref <18)
Troponin I (High Sensitivity): 8 ng/L (ref <18)

## 2022-02-21 MED ORDER — CIPROFLOXACIN IN D5W 400 MG/200ML IV SOLN
400.0000 mg | Freq: Once | INTRAVENOUS | Status: AC
  Administered 2022-02-21: 400 mg via INTRAVENOUS
  Filled 2022-02-21: qty 200

## 2022-02-21 MED ORDER — LATANOPROST 0.005 % OP SOLN
1.0000 [drp] | Freq: Every day | OPHTHALMIC | Status: DC
  Administered 2022-02-21: 1 [drp] via OPHTHALMIC
  Filled 2022-02-21 (×2): qty 2.5

## 2022-02-21 MED ORDER — ONDANSETRON HCL 4 MG/2ML IJ SOLN
4.0000 mg | Freq: Once | INTRAMUSCULAR | Status: AC
  Administered 2022-02-21: 4 mg via INTRAVENOUS
  Filled 2022-02-21: qty 2

## 2022-02-21 MED ORDER — METRONIDAZOLE 500 MG/100ML IV SOLN
500.0000 mg | Freq: Once | INTRAVENOUS | Status: AC
  Administered 2022-02-21: 500 mg via INTRAVENOUS
  Filled 2022-02-21: qty 100

## 2022-02-21 MED ORDER — GADOBUTROL 1 MMOL/ML IV SOLN
6.0000 mL | Freq: Once | INTRAVENOUS | Status: AC | PRN
  Administered 2022-02-21: 6 mL via INTRAVENOUS

## 2022-02-21 MED ORDER — GABAPENTIN 300 MG PO CAPS
300.0000 mg | ORAL_CAPSULE | Freq: Three times a day (TID) | ORAL | Status: DC
  Filled 2022-02-21: qty 1

## 2022-02-21 MED ORDER — SODIUM CHLORIDE 0.9 % IV SOLN: INTRAVENOUS | Status: DC

## 2022-02-21 MED ORDER — MORPHINE SULFATE (PF) 2 MG/ML IV SOLN
0.5000 mg | INTRAVENOUS | Status: DC | PRN
  Administered 2022-02-21 – 2022-02-22 (×3): 0.5 mg via INTRAVENOUS
  Filled 2022-02-21 (×3): qty 1

## 2022-02-21 MED ORDER — CYANOCOBALAMIN 500 MCG PO TABS
2000.0000 ug | ORAL_TABLET | Freq: Every day | ORAL | Status: DC
  Filled 2022-02-21 (×2): qty 4

## 2022-02-21 MED ORDER — ALBUTEROL SULFATE (2.5 MG/3ML) 0.083% IN NEBU: 2.5000 mg | INHALATION_SOLUTION | RESPIRATORY_TRACT | Status: DC | PRN

## 2022-02-21 MED ORDER — LORATADINE 10 MG PO TABS
10.0000 mg | ORAL_TABLET | Freq: Every day | ORAL | Status: DC
  Filled 2022-02-21: qty 1

## 2022-02-21 MED ORDER — POTASSIUM CHLORIDE 20 MEQ PO PACK
40.0000 meq | PACK | Freq: Once | ORAL | Status: DC
  Filled 2022-02-21: qty 2

## 2022-02-21 MED ORDER — FLUTICASONE PROPIONATE 50 MCG/ACT NA SUSP: 1.0000 | Freq: Every day | NASAL | Status: DC | PRN

## 2022-02-21 MED ORDER — ADULT MULTIVITAMIN W/MINERALS CH
1.0000 | ORAL_TABLET | Freq: Every day | ORAL | Status: DC
  Filled 2022-02-21: qty 1

## 2022-02-21 MED ORDER — PANTOPRAZOLE SODIUM 40 MG IV SOLR
40.0000 mg | Freq: Once | INTRAVENOUS | Status: AC
  Administered 2022-02-21: 40 mg via INTRAVENOUS
  Filled 2022-02-21: qty 10

## 2022-02-21 MED ORDER — HYDRALAZINE HCL 20 MG/ML IJ SOLN
5.0000 mg | INTRAMUSCULAR | Status: DC | PRN
  Administered 2022-02-22 (×2): 5 mg via INTRAVENOUS
  Filled 2022-02-21 (×2): qty 1

## 2022-02-21 MED ORDER — IOHEXOL 300 MG/ML  SOLN
80.0000 mL | Freq: Once | INTRAMUSCULAR | Status: AC | PRN
  Administered 2022-02-21: 100 mL via INTRAVENOUS

## 2022-02-21 MED ORDER — HYDROMORPHONE HCL 1 MG/ML IJ SOLN
0.5000 mg | Freq: Once | INTRAMUSCULAR | Status: AC
  Administered 2022-02-21: 0.5 mg via INTRAVENOUS
  Filled 2022-02-21: qty 0.5

## 2022-02-21 MED ORDER — OCUVITE-LUTEIN PO CAPS
1.0000 | ORAL_CAPSULE | Freq: Two times a day (BID) | ORAL | Status: DC
  Filled 2022-02-21: qty 1

## 2022-02-21 MED ORDER — PANTOPRAZOLE SODIUM 40 MG PO TBEC
40.0000 mg | DELAYED_RELEASE_TABLET | Freq: Every day | ORAL | Status: DC
  Filled 2022-02-21 (×2): qty 1

## 2022-02-21 MED ORDER — DULOXETINE HCL 30 MG PO CPEP
30.0000 mg | ORAL_CAPSULE | Freq: Every day | ORAL | Status: DC
  Filled 2022-02-21: qty 1

## 2022-02-21 MED ORDER — PIPERACILLIN-TAZOBACTAM 3.375 G IVPB
3.3750 g | Freq: Three times a day (TID) | INTRAVENOUS | Status: DC
  Administered 2022-02-22 (×2): 3.375 g via INTRAVENOUS
  Filled 2022-02-21 (×2): qty 50

## 2022-02-21 MED ORDER — ONDANSETRON HCL 4 MG/2ML IJ SOLN
4.0000 mg | Freq: Three times a day (TID) | INTRAMUSCULAR | Status: DC | PRN
  Administered 2022-02-21: 4 mg via INTRAVENOUS
  Filled 2022-02-21: qty 2

## 2022-02-21 MED ORDER — POTASSIUM CHLORIDE 10 MEQ/100ML IV SOLN
10.0000 meq | INTRAVENOUS | Status: AC
  Administered 2022-02-21 – 2022-02-22 (×4): 10 meq via INTRAVENOUS
  Filled 2022-02-21 (×3): qty 100

## 2022-02-21 MED ORDER — ACETAMINOPHEN 325 MG PO TABS: 650.0000 mg | ORAL_TABLET | Freq: Four times a day (QID) | ORAL | Status: DC | PRN

## 2022-02-21 MED ORDER — OXYCODONE-ACETAMINOPHEN 5-325 MG PO TABS: 1.0000 | ORAL_TABLET | ORAL | Status: DC | PRN

## 2022-02-21 MED ORDER — RISAQUAD PO CAPS
1.0000 | ORAL_CAPSULE | Freq: Two times a day (BID) | ORAL | Status: DC
  Filled 2022-02-21 (×2): qty 1

## 2022-02-21 MED ORDER — SODIUM CHLORIDE 0.9 % IV BOLUS
1000.0000 mL | Freq: Once | INTRAVENOUS | Status: AC
  Administered 2022-02-21: 1000 mL via INTRAVENOUS

## 2022-02-21 MED ORDER — LOSARTAN POTASSIUM 50 MG PO TABS
50.0000 mg | ORAL_TABLET | Freq: Every day | ORAL | Status: DC
  Filled 2022-02-21: qty 1

## 2022-02-21 MED ORDER — DM-GUAIFENESIN ER 30-600 MG PO TB12: 1.0000 | ORAL_TABLET | Freq: Two times a day (BID) | ORAL | Status: DC | PRN

## 2022-02-21 NOTE — H&P (Signed)
History and Physical    [name] [name] [id number] DOB: [birth year] DOA: 02/21/2022  Referring MD/NP/PA:   PCP: Dewayne Shorter, MD   Patient coming from:  The patient is coming from home.        Chief Complaint: diarrhea and abdominal pain  HPI: [name] [name] is a 89 y.o. 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  Patient states that she has intermittent diarrhea and abdominal pain for about 1 week.  The abdominal pain is located in the left side of her abdomen, moderate, aching, cramping-like pain, nonradiating.  No nausea and vomiting.  No fever or chills.  She noticed darker colored stool sometimes.  Patient does not have chest pain, cough, shortness breath.  No symptoms of UTI.   Data reviewed independently and ED Course: pt was found to have hemoglobin 9.9 (13.0 on 12/21/2020), WBC 17.4, negative PCR for COVID, potassium 3.4, GFR> 60.  Temperature normal, blood pressure 172/67, heart rate 84, RR 13, oxygen saturation 97% on room air.  CT scan of abdomen/pelvis that showed perforated sigmoid diverticulitis with abscess formation and dilated biliary duct.  Patient is admitted to PCU as inpatient.  Dr. Dahlia Byes of surgery is consulted.  CT abdomen/pelvis:     1. CT findings most consistent with complex perforated lower sigmoid colon diverticulitis with large complex intramural and pericolonic abscess. 2. Large amount of gas in the vagina. Could not exclude a rectovaginal fistula. 3. Intra and extrahepatic biliary dilatation new since prior CT scan. Possible obstructing distal common bile duct stone. Recommend correlation with liver function studies. ERCP or MRCP may be helpful for further evaluation.   EKG: I have personally  reviewed.  Sinus rhythm, QTc 460, nonspecific T wave change.   Review of Systems:   General: no fevers, chills, no body weight gain, has poor appetite, has fatigue HEENT: no blurry vision, hearing changes or sore throat Respiratory: no dyspnea, coughing, wheezing CV: no chest pain, no palpitations GI: Has diarrhea and abdominal pain.   GU: no dysuria, burning on urination, increased urinary frequency, hematuria  Ext: no leg edema Neuro: no unilateral weakness, numbness, or tingling, no vision change or hearing loss Skin: no rash, no skin tear. MSK: No muscle spasm, no deformity, no limitation of range of movement in spin Heme: No easy bruising.  Travel history: No recent long distant travel.   Allergy:  Allergies  Allergen Reactions   Nsaids Hives   Dextrans Hives   Fentanyl Itching   Lipitor [Atorvastatin] Other (See Comments)    Reaction: Muscle pain   Lovastatin    Mobic [Meloxicam] Other (See Comments)    Reaction: Mouth and tongue ulcers   Niacin And Related Itching   Other    Polysaccharide K Hives   Pravachol [Pravastatin Sodium] Other (See Comments)    Reaction: Muscle pain   Statins Other (See Comments)   Tolmetin Hives   Voltaren [Diclofenac Sodium] Other (See Comments)    Reaction: Mouth and tongue ulcers   Zetia [Ezetimibe] Other (See Comments)    Reaction: Leg pain   Codeine Nausea And Vomiting   Niacin Itching    Past Medical History:  Diagnosis Date   Actinic keratosis    Anemia    COPD (chronic obstructive pulmonary disease) (HCC)    Diverticulitis    GERD (gastroesophageal reflux disease)  Glaucoma    HLD (hyperlipidemia)    Hypertension    Iron deficiency    Rectal bleeding    Skin cancer    Nose, txted by Dr. Valere Dross at [facility]    Past Surgical History:  Procedure Laterality Date   ABDOMINAL HYSTERECTOMY     APPENDECTOMY     COLONOSCOPY WITH PROPOFOL N/A 04/14/2016   Procedure: COLONOSCOPY WITH PROPOFOL;  Surgeon: Lucilla Lame, MD;   Location: [facility] ENDOSCOPY;  Service: Endoscopy;  Laterality: N/A;   ENTEROSCOPY N/A 05/10/2016   Procedure: Push ENTEROSCOPY with pediatric colonoscope;  Surgeon: [name], MD;  Location: [facility] ENDOSCOPY;  Service: Endoscopy;  Laterality: N/A;   ENTEROSCOPY N/A 05/30/2016   Procedure: push ENTEROSCOPY;  Surgeon: [name], MD;  Location: [facility] ENDOSCOPY;  Service: Endoscopy;  Laterality: N/A;   ENTEROSCOPY N/A 07/05/2017   Procedure: ENTEROSCOPY;  Surgeon: [name], MD;  Location: [facility] ENDOSCOPY;  Service: Gastroenterology;  Laterality: N/A;   ENTEROSCOPY N/A 06/21/2018   Procedure: ENTEROSCOPY;  Surgeon: [name], MD;  Location: [facility] ENDOSCOPY;  Service: Gastroenterology;  Laterality: N/A;   ESOPHAGOGASTRODUODENOSCOPY (EGD) WITH PROPOFOL N/A 08/12/2015   Procedure: ESOPHAGOGASTRODUODENOSCOPY (EGD) WITH PROPOFOL;  Surgeon: Lollie Sails, MD;  Location: [facility] ENDOSCOPY;  Service: Endoscopy;  Laterality: N/A;   ESOPHAGOGASTRODUODENOSCOPY (EGD) WITH PROPOFOL N/A 04/03/2016   Procedure: ESOPHAGOGASTRODUODENOSCOPY (EGD) WITH PROPOFOL;  Surgeon: Lucilla Lame, MD;  Location: [facility] ENDOSCOPY;  Service: Endoscopy;  Laterality: N/A;   GIVENS CAPSULE STUDY N/A 04/28/2016   Procedure: GIVENS CAPSULE STUDY;  Surgeon: [name], MD;  Location: [facility] ENDOSCOPY;  Service: Endoscopy;  Laterality: N/A;   GIVENS CAPSULE STUDY N/A 06/21/2017   Procedure: GIVENS CAPSULE STUDY;  Surgeon: [name], MD;  Location: [facility] ENDOSCOPY;  Service: Gastroenterology;  Laterality: N/A;   JOINT REPLACEMENT      Social History:  reports that she quit smoking about 42 years ago. Her smoking use included cigarettes. She has a 40.00 pack-year smoking history. She has never used smokeless tobacco. She reports that she does not currently use alcohol after a past usage of about 1.0 standard drink of alcohol per week. She reports that she does not use drugs.  Family History:  Family History  Problem Relation Age of Onset   CAD Mother     CAD Father    CAD Brother      Prior to Admission medications   Medication Sig Start Date End Date Taking? Authorizing Provider  acetaminophen (TYLENOL) 500 MG tablet Take 500-1,000 mg by mouth every 6 (six) hours as needed for mild pain, moderate pain or fever.   Yes [provider]  aluminum-magnesium hydroxide 200-200 MG/5ML suspension Take 30 mLs by mouth every 4 (four) hours as needed (For mouth ulcers). Mix with Lidocaine viscous 2 %   Yes [provider]  bisacodyl (DULCOLAX) 5 MG EC tablet Take 1 tablet (5 mg total) by mouth daily as needed for moderate constipation. 11/29/21  Yes Emeterio Reeve, DO  Cyanocobalamin (B-12) 1000 MCG CAPS Take 2,000 Units by mouth daily.    Yes [provider]  DULoxetine (CYMBALTA) 30 MG capsule Take 1 capsule (30 mg total) by mouth daily. 12/08/21  Yes Lauree Chandler, NP  fluticasone (FLONASE) 50 MCG/ACT nasal spray Place 1 spray into both nostrils daily.   Yes [provider]  gabapentin (NEURONTIN) 300 MG capsule Take 1 capsule (300 mg total) by mouth 3 (three) times daily. 11/29/21  Yes Emeterio Reeve, DO  hydrochlorothiazide (HYDRODIURIL) 12.5 MG tablet Take  12.5 mg by mouth daily. 01/13/22  Yes [provider]  latanoprost (XALATAN) 0.005 % ophthalmic solution Place 1 drop into both eyes at bedtime.   Yes [provider]  lidocaine (XYLOCAINE) 2 % solution Use as directed in the mouth or throat. Mix with Maalox 30 mL and allow patient to apply to affected areas in mouth every 4 hours as needed for mouth ulcers   Yes [provider]  lidocaine-prilocaine (EMLA) cream Apply topically every 4 (four) hours as needed (skin pain). 11/29/21  Yes Emeterio Reeve, DO  loratadine (CLARITIN) 10 MG tablet Take 1 tablet (10 mg total) by mouth at bedtime. 01/15/22  Yes Medina-Vargas, Monina C, NP  losartan (COZAAR) 50 MG tablet Take 50 mg by mouth daily.   Yes [provider]   mometasone (ELOCON) 0.1 % lotion Apply 1 application topically daily as needed (skin irritation).   Yes [provider]  Multiple Vitamins-Minerals (PRESERVISION AREDS 2) CAPS Take 1 capsule by mouth 2 (two) times daily.   Yes [provider]  polyethylene glycol (MIRALAX / GLYCOLAX) 17 g packet Take 17 g by mouth daily.   Yes [provider]  PREVIDENT 5000 PLUS 1.1 % CREA dental cream Place 1 Application onto teeth daily. 02/09/22  Yes [provider]  RABEprazole (ACIPHEX) 20 MG tablet Take 20 mg by mouth 2 (two) times daily.   Yes [provider]  Saccharomyces boulardii (PROBIOTIC) 250 MG CAPS Take 1 capsule by mouth 2 (two) times daily.   Yes [provider]  timolol (TIMOPTIC) 0.5 % ophthalmic solution Place 1 drop into both eyes daily. 11/23/21  Yes [provider]  lactose free nutrition (BOOST) LIQD Take 237 mLs by mouth daily.    [provider]  methocarbamol (ROBAXIN) 500 MG tablet Take 0.5 tablets (250 mg total) by mouth 2 (two) times daily. 12/08/21   Medina-Vargas, Monina C, NP  omeprazole (PRILOSEC) 20 MG capsule Take 20 mg by mouth in the morning and at bedtime. Patient not taking: Reported on 02/21/2022    [provider]  oxyCODONE (OXYCONTIN) 10 mg 12 hr tablet Take 10 mg by mouth at bedtime. At bedtime for 1 week    [provider]  senna (SENOKOT) 8.6 MG tablet Take 2 tablets by mouth daily.    [provider]    Physical Exam: Vitals:   02/21/22 1800 02/21/22 1814 02/21/22 1830 02/21/22 1850  BP:    (!) 147/84  Pulse: 81   82  Resp: (!) 21     Temp:   98.1 F (36.7 C)   TempSrc:   Oral   SpO2: 97%   98%  Weight:  63.5 kg     General: Not in acute distress HEENT:       Eyes: PERRL, EOMI, no scleral icterus.       ENT: No discharge from the ears and nose, no pharynx injection, no tonsillar enlargement.        Neck: No JVD, no bruit, no mass felt. Heme: No neck lymph  node enlargement. Cardiac: S1/S2, RRR, No murmurs, No gallops or rubs. Respiratory: No rales, wheezing, rhonchi or rubs. GI: Soft, nondistended, has tenderness left side of abdomen, no rebound pain, no organomegaly, BS present. GU: No hematuria Ext: No pitting leg edema bilaterally. 1+DP/PT pulse bilaterally. Musculoskeletal: No joint deformities, No joint redness or warmth, no limitation of ROM in spin. Skin: No rashes.  Neuro: Alert, oriented X3, cranial nerves II-XII grossly intact, moves  all extremities normally.   Psych: Patient is not psychotic, no suicidal or hemocidal ideation.  Labs on Admission: I have personally reviewed following labs and imaging studies  CBC: Recent Labs  Lab 02/21/22 1312  WBC 17.4*  NEUTROABS 14.4*  HGB 9.9*  HCT 30.2*  MCV 81.6  PLT 0000000*   Basic Metabolic Panel: Recent Labs  Lab 02/21/22 1312  NA 131*  K 3.4*  CL 93*  CO2 27  GLUCOSE 101*  BUN 11  CREATININE 0.52  CALCIUM 8.8*   GFR: Estimated Creatinine Clearance: 40.9 mL/min (by C-G formula based on SCr of 0.52 mg/dL). Liver Function Tests: Recent Labs  Lab 02/21/22 1312  AST 14*  ALT 8  ALKPHOS 66  BILITOT 0.5  PROT 7.2  ALBUMIN 3.0*   Recent Labs  Lab 02/21/22 1312  LIPASE 30   No results for input(s): "AMMONIA" in the last 168 hours. Coagulation Profile: No results for input(s): "INR", "PROTIME" in the last 168 hours. Cardiac Enzymes: No results for input(s): "CKTOTAL", "CKMB", "CKMBINDEX", "TROPONINI" in the last 168 hours. BNP (last 3 results) No results for input(s): "PROBNP" in the last 8760 hours. HbA1C: No results for input(s): "HGBA1C" in the last 72 hours. CBG: No results for input(s): "GLUCAP" in the last 168 hours. Lipid Profile: No results for input(s): "CHOL", "HDL", "LDLCALC", "TRIG", "CHOLHDL", "LDLDIRECT" in the last 72 hours. Thyroid Function Tests: No results for input(s): "TSH", "T4TOTAL", "FREET4", "T3FREE", "THYROIDAB" in the last 72  hours. Anemia Panel: No results for input(s): "VITAMINB12", "FOLATE", "FERRITIN", "TIBC", "IRON", "RETICCTPCT" in the last 72 hours. Urine analysis:    Component Value Date/Time   COLORURINE STRAW (A) 02/21/2022 1544   APPEARANCEUR CLEAR (A) 02/21/2022 1544   LABSPEC 1.021 02/21/2022 1544   PHURINE 7.0 02/21/2022 1544   GLUCOSEU NEGATIVE 02/21/2022 1544   HGBUR NEGATIVE 02/21/2022 1544   BILIRUBINUR NEGATIVE 02/21/2022 1544   KETONESUR NEGATIVE 02/21/2022 1544   PROTEINUR NEGATIVE 02/21/2022 1544   NITRITE NEGATIVE 02/21/2022 1544   LEUKOCYTESUR NEGATIVE 02/21/2022 1544   Sepsis Labs: @LABRCNTIP$ (procalcitonin:4,lacticidven:4) ) Recent Results (from the past 240 hour(s))  Resp panel by RT-PCR (RSV, Flu A&B, Covid) Anterior Nasal Swab     Status: None   Collection Time: 02/21/22  1:12 PM   Specimen: Anterior Nasal Swab  Result Value Ref Range Status   SARS Coronavirus 2 by RT PCR NEGATIVE NEGATIVE Final    Comment: (NOTE) SARS-CoV-2 target nucleic acids are NOT DETECTED.  The SARS-CoV-2 RNA is generally detectable in upper respiratory specimens during the acute phase of infection. The lowest concentration of SARS-CoV-2 viral copies this assay can detect is 138 copies/mL. A negative result does not preclude SARS-Cov-2 infection and should not be used as the sole basis for treatment or other patient management decisions. A negative result may occur with  improper specimen collection/handling, submission of specimen other than nasopharyngeal swab, presence of viral mutation(s) within the areas targeted by this assay, and inadequate number of viral copies(<138 copies/mL). A negative result must be combined with clinical observations, patient history, and epidemiological information. The expected result is Negative.  Fact Sheet for Patients:  EntrepreneurPulse.com.au  Fact Sheet for Healthcare Providers:  IncredibleEmployment.be  This test  is no t yet approved or cleared by the [place] FDA and  has been authorized for detection and/or diagnosis of SARS-CoV-2 by FDA under an Emergency Use Authorization (EUA). This EUA will remain  in effect (meaning this test can be used) for the duration of the COVID-19  declaration under Section 564(b)(1) of the Act, 21 U.S.C.section 360bbb-3(b)(1), unless the authorization is terminated  or revoked sooner.       Influenza A by PCR NEGATIVE NEGATIVE Final   Influenza B by PCR NEGATIVE NEGATIVE Final    Comment: (NOTE) The Xpert Xpress SARS-CoV-2/FLU/RSV plus assay is intended as an aid in the diagnosis of influenza from Nasopharyngeal swab specimens and should not be used as a sole basis for treatment. Nasal washings and aspirates are unacceptable for Xpert Xpress SARS-CoV-2/FLU/RSV testing.  Fact Sheet for Patients: EntrepreneurPulse.com.au  Fact Sheet for Healthcare Providers: IncredibleEmployment.be  This test is not yet approved or cleared by the [place] FDA and has been authorized for detection and/or diagnosis of SARS-CoV-2 by FDA under an Emergency Use Authorization (EUA). This EUA will remain in effect (meaning this test can be used) for the duration of the COVID-19 declaration under Section 564(b)(1) of the Act, 21 U.S.C. section 360bbb-3(b)(1), unless the authorization is terminated or revoked.     Resp Syncytial Virus by PCR NEGATIVE NEGATIVE Final    Comment: (NOTE) Fact Sheet for Patients: EntrepreneurPulse.com.au  Fact Sheet for Healthcare Providers: IncredibleEmployment.be  This test is not yet approved or cleared by the [place] FDA and has been authorized for detection and/or diagnosis of SARS-CoV-2 by FDA under an Emergency Use Authorization (EUA). This EUA will remain in effect (meaning this test can be used) for the duration of the COVID-19 declaration under Section  564(b)(1) of the Act, 21 U.S.C. section 360bbb-3(b)(1), unless the authorization is terminated or revoked.  Performed at [facility], [address], [place], [place] [address]      Radiological Exams on Admission: CT Abdomen Pelvis W Contrast  Result Date: 02/21/2022 CLINICAL DATA:  Abdominal pain for 1 week. EXAM: CT ABDOMEN AND PELVIS WITH CONTRAST TECHNIQUE: Multidetector CT imaging of the abdomen and pelvis was performed using the standard protocol following bolus administration of intravenous contrast. RADIATION DOSE REDUCTION: This exam was performed according to the departmental dose-optimization program which includes automated exposure control, adjustment of the mA and/or kV according to patient size and/or use of iterative reconstruction technique. CONTRAST:  144m OMNIPAQUE IOHEXOL 300 MG/ML  SOLN COMPARISON:  Bilat in 2023 FINDINGS: Lower chest: Patchy E peripheral tree-in-bud type nodularity most notable in the lingula. Findings could be due to chronic inflammation or atypical infection such as MAC. No focal airspace consolidation or pleural effusion. The heart is normal in size. No pericardial effusion. Stable aortic and coronary artery calcifications and calcifications around the aortic valve. Hepatobiliary: Intrahepatic biliary dilatation new since prior CT scan. There is also dilatation of the common bile duct measuring up to 1 cm in the head of the pancreas. Could not exclude a distal common bile duct stone. I do not see any evidence of a pancreatic head mass or ampullary lesion. Recommend correlation with liver function studies. ERCP or MRCP may be helpful for further evaluation. No worrisome hepatic lesions. The gallbladder is mildly distended measuring up to 5 cm in transverse dimension. No wall thickening or pericholecystic fluid and no obvious gallstones. Pancreas: Moderate age related pancreatic atrophy but no mass, inflammation or ductal dilatation. Spleen: Normal  size.  No focal lesions. Adrenals/Urinary Tract: The adrenal glands are normal. No renal, ureteral or bladder calculi or mass. The bladder is partially obscured by artifact from right hip prosthesis. Stomach/Bowel: The stomach, duodenum and small bowel are unremarkable. No acute inflammatory process, mass lesions or obstructive findings. Severe sigmoid colon diverticulosis  and probable perforated diverticulitis involving the lower sigmoid colon. Suspect complex intramural abscess and perirectal abscess. The remainder of the colon is unremarkable. Vascular/Lymphatic: Advanced atherosclerotic calcification involving the aorta and branch vessels but no aneurysm or dissection. The major venous structures are patent. Small scattered mesenteric and retroperitoneal lymph nodes. Reproductive: Surgically absent. Other: Moderate gas in the vagina. Could not exclude a rectovaginal fistula. Musculoskeletal: Right total hip arthroplasty. Advanced degenerative changes involving the lumbar spine and left hip. Chronic T12 compression fracture. IMPRESSION: 1. CT findings most consistent with complex perforated lower sigmoid colon diverticulitis with large complex intramural and pericolonic abscess. 2. Large amount of gas in the vagina. Could not exclude a rectovaginal fistula. 3. Intra and extrahepatic biliary dilatation new since prior CT scan. Possible obstructing distal common bile duct stone. Recommend correlation with liver function studies. ERCP or MRCP may be helpful for further evaluation. Electronically Signed   By: Marijo Sanes M.D.   On: 02/21/2022 16:05      Assessment/Plan Principal Problem:   Perforation of sigmoid colon due to diverticulitis Active Problems:   Dilated bile duct   Iron deficiency anemia due to chronic blood loss   HTN (hypertension), benign   Chronic obstructive pulmonary disease (HCC)   Depression   Assessment and Plan:  Perforation of sigmoid colon due to diverticulitis: CT showed  complex perforated lower sigmoid colon diverticulitis with large complex intramural and pericolonic abscess. Pt has WBC 17.4. no fever, not meets criteria for sepsis.  -Admitted to PCU as inpatient -IV Zosyn (patient received 1 dose of Cipro and Flagyl in ED -Check C. Difficile -As needed morphine, Percocet -As needed Zofran -N.p.o. for -IV fluid: 1 L normal saline, then 75 cc/h -consulted Dr. Dahlia Byes of surgery  Dilated bile duct: CT showed intra and extrahepatic biliary dilatation new since prior CT scan. Concerning for obstructing distal common bile duct stone.  -f/u MRCP, if positive --> will need to consult GI.  Iron deficiency anemia due to chronic blood loss: Hgb stable -f/u by CBC  HTN (hypertension), benign -hold HCTZ -continue [place] -IV hydralazine as needed  Chronic obstructive pulmonary disease ([place]): Stable -Bronchodilators  Depression -Continue home medications       DVT ppx: SCD  Code Status: DNR per pt and her son-in law  Family Communication:   Yes, patient's son-in  law at bed side.    Disposition Plan:  Anticipate discharge back to previous environment  Consults called:  Dr. Dahlia Byes of surgery  Admission status and Level of care: Progressive:  as inpt       Dispo: The patient is from: Home              Anticipated d/c is to:  to be  determined              Anticipated d/c date is: 2 days              Patient currently is not medically stable to d/c.    Severity of Illness:  The appropriate patient status for this patient is INPATIENT. Inpatient status is judged to be reasonable and necessary in order to provide the required intensity of service to ensure the patient's safety. The patient's presenting symptoms, physical exam findings, and initial radiographic and laboratory data in the context of their chronic comorbidities is felt to place them at high risk for further clinical deterioration. Furthermore, it is not anticipated that the patient  will be medically stable for discharge from the hospital within  2 midnights of admission.   * I certify that at the point of admission it is my clinical judgment that the patient will require inpatient hospital care spanning beyond 2 midnights from the point of admission due to high intensity of service, high risk for further deterioration and high frequency of surveillance required.*       Date of Service 02/21/2022    Ivor Costa [facility]   If 7PM-7AM, please contact night-coverage [email or web address] 02/21/2022, 7:22 PM

## 2022-02-21 NOTE — ED Provider Notes (Signed)
[facility] Provider Note   Event Date/Time   First MD Initiated Contact with Patient 02/21/22 1303     (approximate) History  Diarrhea  HPI Krisanna Kratky is a 89 y.o. female with a stated past medical history of diverticulitis and status postchemotherapy/radiation for head/neck cancer in 11/2020 who presents for diarrhea and abdominal pain over the last week.  Patient endorses intermittent diarrhea without blood as well at this central and left lower quadrant abdominal pain that is cramping, nonradiating, and associated with the diarrhea.  Patient denies any recent travel or sick contacts.  Patient denies any subjective fevers. ROS: Patient currently denies any vision changes, tinnitus, difficulty speaking, facial droop, sore throat, chest pain, shortness of breath, nausea/vomiting, dysuria, or weakness/numbness/paresthesias in any extremity   Physical Exam  Triage Vital Signs: ED Triage Vitals  Enc Vitals Group     BP      Pulse      Resp      Temp      Temp src      SpO2      Weight      Height      Head Circumference      Peak Flow      Pain Score      Pain Loc      Pain Edu?      Excl. in [place]?    Most recent vital signs: Vitals:   02/22/22 1200 02/22/22 1400  BP: (!) 161/59 (!) 177/81  Pulse: 80 80  Resp:  20  Temp:  97.8 F (36.6 C)  SpO2: 95% 94%   General: Awake, oriented x4. CV:  Good peripheral perfusion.  Resp:  Normal effort.  Abd:  No distention.  Other:  Elderly Caucasian female laying in bed in no acute distress ED Results / Procedures / Treatments  Labs (all labs ordered are listed, but only abnormal results are displayed) Labs Reviewed  URINALYSIS, ROUTINE W REFLEX MICROSCOPIC - Abnormal; Notable for the following components:      Result Value   Color, Urine STRAW (*)    APPearance CLEAR (*)    All other components within normal limits  COMPREHENSIVE METABOLIC PANEL - Abnormal; Notable for the following  components:   Sodium 131 (*)    Potassium 3.4 (*)    Chloride 93 (*)    Glucose, Bld 101 (*)    Calcium 8.8 (*)    Albumin 3.0 (*)    AST 14 (*)    All other components within normal limits  CBC WITH DIFFERENTIAL/PLATELET - Abnormal; Notable for the following components:   WBC 17.4 (*)    RBC 3.70 (*)    Hemoglobin 9.9 (*)    HCT 30.2 (*)    RDW 15.9 (*)    Platelets 544 (*)    Neutro Abs 14.4 (*)    Monocytes Absolute 1.6 (*)    Abs Immature Granulocytes 0.10 (*)    All other components within normal limits  BASIC METABOLIC PANEL - Abnormal; Notable for the following components:   Sodium 131 (*)    Chloride 96 (*)    Glucose, Bld 100 (*)    Calcium 8.5 (*)    All other components within normal limits  CBC - Abnormal; Notable for the following components:   WBC 17.8 (*)    RBC 3.56 (*)    Hemoglobin 9.4 (*)    HCT 28.8 (*)    RDW 15.9 (*)  Platelets 498 (*)    All other components within normal limits  APTT - Abnormal; Notable for the following components:   aPTT 48 (*)    All other components within normal limits  RESP PANEL BY RT-PCR (RSV, FLU A&B, COVID)  RVPGX2  CULTURE, BLOOD (ROUTINE X 2)  CULTURE, BLOOD (ROUTINE X 2)  C DIFFICILE QUICK SCREEN W PCR REFLEX    LIPASE, BLOOD  MAGNESIUM  PROTIME-INR  CBG MONITORING, ED  TYPE AND SCREEN  TROPONIN I (HIGH SENSITIVITY)  TROPONIN I (HIGH SENSITIVITY)   EKG ED ECG REPORT I, Naaman Plummer, the attending physician, personally viewed and interpreted this ECG. Date: 02/21/2022 EKG Time: 1302 Rate: 77 Rhythm: normal sinus rhythm QRS Axis: normal Intervals: normal ST/T Wave abnormalities: normal Narrative Interpretation: no evidence of acute ischemia RADIOLOGY ED MD interpretation: Pending -Agree with radiology assessment Official radiology report(s): MR ABDOMEN MRCP W WO CONTAST  Result Date: 02/22/2022 CLINICAL DATA:  Right upper quadrant abdominal pain. Evaluate for biliary disease. EXAM: MRI ABDOMEN  WITHOUT AND WITH CONTRAST (INCLUDING MRCP) TECHNIQUE: Multiplanar multisequence MR imaging of the abdomen was performed both before and after the administration of intravenous contrast. Heavily T2-weighted images of the biliary and pancreatic ducts were obtained, and three-dimensional MRCP images were rendered by post processing. CONTRAST:  70m GADAVIST GADOBUTROL 1 MMOL/ML IV SOLN COMPARISON:  CT AP 02/21/2022 FINDINGS: Lower chest: No acute findings. Hepatobiliary: No enhancing liver lesion identified. Central left lobe of liver cyst measures 1.9 cm, image 16/6. There are several subcentimeter, nonenhancing T2 hyperintensities which although too small to reliably characterize likely represent small cysts. No gallstones identified. No significant gallbladder wall thickening or pericholecystic inflammation. Mild intrahepatic bile duct dilatation. Mild fusiform dilatation of the common bile duct measures up to 8 mm, image 16/2. No signs of choledocholithiasis. Pancreas: The main duct appears mildly increased in caliber measuring up to 3 mm at the level of the head of pancreas. No mass, inflammatory changes, or other parenchymal abnormality identified. Spleen:  Within normal limits in size and appearance. Adrenals/Urinary Tract: Normal adrenal glands. Small bilateral simple appearing kidney cysts. No follow-up imaging recommended. No hydronephrosis. Stomach/Bowel: No pathologic dilatation of the bowel loops. Inflammatory changes associated with sigmoid diverticulitis are suboptimally visualized on this exam. See dedicated CT abdomen and pelvis from earlier today. Vascular/Lymphatic: Aortic atherosclerosis. No aneurysm. The upper abdominal vascularity is patent. Aortocaval lymph node measures 1.1 cm, image 23/6. Other: Trace perihepatic ascites. Edema noted within the retroperitoneal fat bilaterally. Musculoskeletal: No suspicious bone lesions identified. IMPRESSION: 1. Mild intrahepatic bile duct dilatation with and  fusiform dilatation of the common bile duct. No signs of choledocholithiasis or obstructing mass. 2. Mild increase caliber of the main pancreatic duct measuring up to 3 mm at the level of the head of pancreas. No mass, inflammatory changes, or other parenchymal abnormality identified. 3. Trace perihepatic ascites. 4. Inflammatory changes associated with sigmoid diverticulitis are suboptimally visualized on this exam. See dedicated CT abdomen and pelvis from earlier today. Electronically Signed   By: TKerby MoorsM.D.   On: 02/22/2022 05:20   MR 3D Recon At Scanner  Result Date: 02/22/2022 CLINICAL DATA:  Right upper quadrant abdominal pain. Evaluate for biliary disease. EXAM: MRI ABDOMEN WITHOUT AND WITH CONTRAST (INCLUDING MRCP) TECHNIQUE: Multiplanar multisequence MR imaging of the abdomen was performed both before and after the administration of intravenous contrast. Heavily T2-weighted images of the biliary and pancreatic ducts were obtained, and three-dimensional MRCP images were rendered by post processing.  CONTRAST:  23m GADAVIST GADOBUTROL 1 MMOL/ML IV SOLN COMPARISON:  CT AP 02/21/2022 FINDINGS: Lower chest: No acute findings. Hepatobiliary: No enhancing liver lesion identified. Central left lobe of liver cyst measures 1.9 cm, image 16/6. There are several subcentimeter, nonenhancing T2 hyperintensities which although too small to reliably characterize likely represent small cysts. No gallstones identified. No significant gallbladder wall thickening or pericholecystic inflammation. Mild intrahepatic bile duct dilatation. Mild fusiform dilatation of the common bile duct measures up to 8 mm, image 16/2. No signs of choledocholithiasis. Pancreas: The main duct appears mildly increased in caliber measuring up to 3 mm at the level of the head of pancreas. No mass, inflammatory changes, or other parenchymal abnormality identified. Spleen:  Within normal limits in size and appearance. Adrenals/Urinary Tract:  Normal adrenal glands. Small bilateral simple appearing kidney cysts. No follow-up imaging recommended. No hydronephrosis. Stomach/Bowel: No pathologic dilatation of the bowel loops. Inflammatory changes associated with sigmoid diverticulitis are suboptimally visualized on this exam. See dedicated CT abdomen and pelvis from earlier today. Vascular/Lymphatic: Aortic atherosclerosis. No aneurysm. The upper abdominal vascularity is patent. Aortocaval lymph node measures 1.1 cm, image 23/6. Other: Trace perihepatic ascites. Edema noted within the retroperitoneal fat bilaterally. Musculoskeletal: No suspicious bone lesions identified. IMPRESSION: 1. Mild intrahepatic bile duct dilatation with and fusiform dilatation of the common bile duct. No signs of choledocholithiasis or obstructing mass. 2. Mild increase caliber of the main pancreatic duct measuring up to 3 mm at the level of the head of pancreas. No mass, inflammatory changes, or other parenchymal abnormality identified. 3. Trace perihepatic ascites. 4. Inflammatory changes associated with sigmoid diverticulitis are suboptimally visualized on this exam. See dedicated CT abdomen and pelvis from earlier today. Electronically Signed   By: TKerby MoorsM.D.   On: 02/22/2022 05:20   PROCEDURES: Critical Care performed: No Procedures MEDICATIONS ORDERED IN ED: Medications  albuterol (PROVENTIL) (2.5 MG/3ML) 0.083% nebulizer solution 2.5 mg (has no administration in time range)  dextromethorphan-guaiFENesin (MUCINEX DM) 30-600 MG per 12 hr tablet 1 tablet (has no administration in time range)  oxyCODONE-acetaminophen (PERCOCET/ROXICET) 5-325 MG per tablet 1 tablet (has no administration in time range)  ondansetron (ZOFRAN) injection 4 mg (4 mg Intravenous Given 02/21/22 1936)  hydrALAZINE (APRESOLINE) injection 5 mg (has no administration in time range)  potassium chloride (KLOR-CON) packet 40 mEq (40 mEq Oral Patient Refused/Not Given 02/21/22 1948)   DULoxetine (CYMBALTA) DR capsule 30 mg (30 mg Oral Patient Refused/Not Given 02/22/22 0909)  pantoprazole (PROTONIX) EC tablet 40 mg (40 mg Oral Patient Refused/Not Given 02/22/22 0909)  acidophilus (RISAQUAD) capsule 1 capsule (1 capsule Oral Patient Refused/Not Given 02/22/22 0908)  cyanocobalamin (VITAMIN B12) tablet 2,000 mcg (2,000 mcg Oral Patient Refused/Not Given 02/22/22 0909)  fluticasone (FLONASE) 50 MCG/ACT nasal spray 1 spray (has no administration in time range)  loratadine (CLARITIN) tablet 10 mg (10 mg Oral Not Given 02/21/22 2225)  latanoprost (XALATAN) 0.005 % ophthalmic solution 1 drop (1 drop Both Eyes Given 02/21/22 2228)  0.9 %  sodium chloride infusion ( Intravenous New Bag/Given 02/21/22 1940)  multivitamin with minerals tablet 1 tablet (1 tablet Oral Patient Refused/Not Given 02/22/22 0909)  morphine (PF) 2 MG/ML injection 2 mg (has no administration in time range)  acetaminophen (TYLENOL) tablet 650 mg (has no administration in time range)    Or  acetaminophen (TYLENOL) suppository 650 mg (has no administration in time range)  haloperidol (HALDOL) tablet 0.5 mg (has no administration in time range)    Or  haloperidol (  HALDOL) 2 MG/ML solution 0.5 mg (has no administration in time range)    Or  haloperidol lactate (HALDOL) injection 0.5 mg (has no administration in time range)  glycopyrrolate (ROBINUL) tablet 1 mg (has no administration in time range)    Or  glycopyrrolate (ROBINUL) injection 0.2 mg (has no administration in time range)    Or  glycopyrrolate (ROBINUL) injection 0.2 mg (has no administration in time range)  antiseptic oral rinse (BIOTENE) solution 15 mL (has no administration in time range)  polyvinyl alcohol (LIQUIFILM TEARS) 1.4 % ophthalmic solution 1 drop (has no administration in time range)  morphine (PF) 2 MG/ML injection 1 mg (1 mg Intravenous Given 02/22/22 1525)  sodium chloride 0.9 % bolus 1,000 mL (0 mLs Intravenous Stopped 02/21/22 1451)   ciprofloxacin (CIPRO) IVPB 400 mg (0 mg Intravenous Stopped 02/21/22 1740)  metroNIDAZOLE (FLAGYL) IVPB 500 mg (0 mg Intravenous Stopped 02/21/22 1640)  HYDROmorphone (DILAUDID) injection 0.5 mg (0.5 mg Intravenous Given 02/21/22 1545)  ondansetron (ZOFRAN) injection 4 mg (4 mg Intravenous Given 02/21/22 1545)  iohexol (OMNIPAQUE) 300 MG/ML solution 80 mL (100 mLs Intravenous Contrast Given 02/21/22 1530)  potassium chloride 10 mEq in 100 mL IVPB (0 mEq Intravenous Stopped 02/22/22 0221)  pantoprazole (PROTONIX) injection 40 mg (40 mg Intravenous Given 02/21/22 2040)  gadobutrol (GADAVIST) 1 MMOL/ML injection 6 mL (6 mLs Intravenous Contrast Given 02/21/22 2317)   IMPRESSION / MDM / ASSESSMENT AND PLAN / ED COURSE  I reviewed the triage vital signs and the nursing notes.                             The patient is on the cardiac monitor to evaluate for evidence of arrhythmia and/or significant heart rate changes. Patient's presentation is most consistent with acute presentation with potential threat to life or bodily function. 89 year old female presents for abdominal pain, diarrhea over the past week with concerns for possible diverticulitis.  Patient does show signs of leukocytosis to 17 1.  Located treat with antibiotics.  Patient CT scan pending at time of signout so be signed out to the oncoming provider.   FINAL CLINICAL IMPRESSION(S) / ED DIAGNOSES   Final diagnoses:  Perforation of sigmoid colon due to diverticulitis   Rx / DC Orders   ED Discharge Orders     None      Note:  This document was prepared using Dragon voice recognition software and may include unintentional dictation errors.   Naaman Plummer, MD 02/22/22 [phone]

## 2022-02-21 NOTE — Telephone Encounter (Signed)
NP called nurse Gregary Signs in PCP's office as requested, and left a message with receptionist with call back number

## 2022-02-21 NOTE — Consult Note (Signed)
Pharmacy Antibiotic Note  [name] [name] is a 89 y.o. female admitted on 02/21/2022 with diarrhea and abdominal pain x 1 week. Found to have perforated sigmoid diverticulitis. Received Ciprofloxacin and metronidazole x 1 dose in ED.   Pharmacy has been consulted for Zosyn dosing.  Plan: Initiate Zosyn 3.375g IV q8h (4 hour infusion) 12 hours following recent cipro/flagyl     Temp (24hrs), Avg:98.1 F (36.7 C), Min:98.1 F (36.7 C), Max:98.1 F (36.7 C)  Recent Labs  Lab 02/21/22 1312  WBC 17.4*  CREATININE 0.52    CrCl cannot be calculated (Unknown ideal weight.).    Allergies  Allergen Reactions   Nsaids Hives   Dextrans Hives   Fentanyl Itching   Lipitor [Atorvastatin] Other (See Comments)    Reaction: Muscle pain   Lovastatin    Mobic [Meloxicam] Other (See Comments)    Reaction: Mouth and tongue ulcers   Niacin And Related Itching   Other    Polysaccharide K Hives   Pravachol [Pravastatin Sodium] Other (See Comments)    Reaction: Muscle pain   Statins Other (See Comments)   Tolmetin Hives   Voltaren [Diclofenac Sodium] Other (See Comments)    Reaction: Mouth and tongue ulcers   Zetia [Ezetimibe] Other (See Comments)    Reaction: Leg pain   Codeine Nausea And Vomiting   Niacin Itching    Antimicrobials this admission: 2/13 Cipro x 1 2/13 Metronidazole x 1 2/24 Zosyn  Dose adjustments this admission:   Microbiology results: 2/13 BCx: sent 2/13: C. Diff: ordered   Thank you for allowing pharmacy to be a part of this patient's care.  Dorothe Pea, PharmD, BCPS Clinical Pharmacist   02/21/2022 5:59 PM

## 2022-02-21 NOTE — ED Notes (Signed)
Patient transported back from MRI. 

## 2022-02-21 NOTE — ED Provider Notes (Signed)
Perforated sigmoid diverticulitis, as well as biliary dilation concern for CBD stone, MRCP is ordered for further evaluation.  I consulted with Dr. Dahlia Byes of general surgery and told him about the patient's clinical exam and vital signs stability at this time, plan is for MRCP for further evaluation of above findings and admit to hospitalist service.  Patient stable no significant pain tenderness diffusely but no rigidity or guarding vital signs have been stable.  Antibiotics have been ordered by prior provider, admission.   Lucillie Garfinkel, MD 02/21/22 [phone]

## 2022-02-21 NOTE — ED Notes (Signed)
Patient assisted to bedside commode.

## 2022-02-21 NOTE — ED Notes (Signed)
Patient reports pain in abdomen and jaw rated 8/10

## 2022-02-21 NOTE — ED Notes (Signed)
Patient transported to MRI 

## 2022-02-21 NOTE — ED Triage Notes (Signed)
Patient states diarrhea and abdominal pain for a week

## 2022-02-22 ENCOUNTER — Encounter: Payer: Self-pay | Admitting: Internal Medicine

## 2022-02-22 DIAGNOSIS — K5720 Diverticulitis of large intestine with perforation and abscess without bleeding: Principal | ICD-10-CM

## 2022-02-22 LAB — CBC
HCT: 28.8 % — ABNORMAL LOW (ref 36.0–46.0)
Hemoglobin: 9.4 g/dL — ABNORMAL LOW (ref 12.0–15.0)
MCH: 26.4 pg (ref 26.0–34.0)
MCHC: 32.6 g/dL (ref 30.0–36.0)
MCV: 80.9 fL (ref 80.0–100.0)
Platelets: 498 10*3/uL — ABNORMAL HIGH (ref 150–400)
RBC: 3.56 MIL/uL — ABNORMAL LOW (ref 3.87–5.11)
RDW: 15.9 % — ABNORMAL HIGH (ref 11.5–15.5)
WBC: 17.8 10*3/uL — ABNORMAL HIGH (ref 4.0–10.5)
nRBC: 0 % (ref 0.0–0.2)

## 2022-02-22 LAB — BASIC METABOLIC PANEL
Anion gap: 7 (ref 5–15)
BUN: 9 mg/dL (ref 8–23)
CO2: 28 mmol/L (ref 22–32)
Calcium: 8.5 mg/dL — ABNORMAL LOW (ref 8.9–10.3)
Chloride: 96 mmol/L — ABNORMAL LOW (ref 98–111)
Creatinine, Ser: 0.55 mg/dL (ref 0.44–1.00)
GFR, Estimated: >60 mL/min (ref >60)
Glucose, Bld: 100 mg/dL — ABNORMAL HIGH (ref 70–99)
Potassium: 4.3 mmol/L (ref 3.5–5.1)
Sodium: 131 mmol/L — ABNORMAL LOW (ref 135–145)

## 2022-02-22 LAB — PROTIME-INR
INR: 1.2 (ref 0.8–1.2)
Prothrombin Time: 14.7 seconds (ref 11.4–15.2)

## 2022-02-22 LAB — MAGNESIUM: Magnesium: 1.9 mg/dL (ref 1.7–2.4)

## 2022-02-22 LAB — APTT: aPTT: 48 seconds — ABNORMAL HIGH (ref 24–36)

## 2022-02-22 LAB — CBG MONITORING, ED: Glucose-Capillary: 88 mg/dL (ref 70–99)

## 2022-02-22 MED ORDER — BIOTENE DRY MOUTH MT LIQD: 15.0000 mL | OROMUCOSAL | Status: DC | PRN

## 2022-02-22 MED ORDER — MORPHINE SULFATE (PF) 4 MG/ML IV SOLN
4.0000 mg | INTRAVENOUS | Status: DC | PRN
  Filled 2022-02-22: qty 1

## 2022-02-22 MED ORDER — MORPHINE SULFATE (PF) 2 MG/ML IV SOLN
2.0000 mg | Freq: Once | INTRAVENOUS | Status: DC
  Filled 2022-02-22 (×2): qty 1

## 2022-02-22 MED ORDER — GLYCOPYRROLATE 0.2 MG/ML IJ SOLN: 0.2000 mg | INTRAMUSCULAR | Status: DC | PRN

## 2022-02-22 MED ORDER — HALOPERIDOL 0.5 MG PO TABS: 0.5000 mg | ORAL_TABLET | ORAL | Status: DC | PRN

## 2022-02-22 MED ORDER — MORPHINE SULFATE (PF) 2 MG/ML IV SOLN
1.0000 mg | INTRAVENOUS | Status: DC | PRN
  Administered 2022-02-22 – 2022-02-23 (×5): 1 mg via INTRAVENOUS
  Filled 2022-02-22 (×5): qty 1

## 2022-02-22 MED ORDER — GLYCOPYRROLATE 1 MG PO TABS: 1.0000 mg | ORAL_TABLET | ORAL | Status: DC | PRN

## 2022-02-22 MED ORDER — ACETAMINOPHEN 650 MG RE SUPP: 650.0000 mg | Freq: Four times a day (QID) | RECTAL | Status: DC | PRN

## 2022-02-22 MED ORDER — HALOPERIDOL LACTATE 2 MG/ML PO CONC: 0.5000 mg | ORAL | Status: DC | PRN

## 2022-02-22 MED ORDER — ACETAMINOPHEN 325 MG PO TABS: 650.0000 mg | ORAL_TABLET | Freq: Four times a day (QID) | ORAL | Status: DC | PRN

## 2022-02-22 MED ORDER — HALOPERIDOL LACTATE 5 MG/ML IJ SOLN: 0.5000 mg | INTRAMUSCULAR | Status: DC | PRN

## 2022-02-22 MED ORDER — POLYVINYL ALCOHOL 1.4 % OP SOLN: 1.0000 [drp] | Freq: Four times a day (QID) | OPHTHALMIC | Status: DC | PRN

## 2022-02-22 NOTE — Progress Notes (Signed)
Progress Note   Patient: [name] [name] [id number] DOB: 1925-10-13 DOA: 02/21/2022     1 DOS: the patient was seen and examined on 02/22/2022   Brief hospital course: Fela Botkin is a 89 y.o. 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   Patient states that she has intermittent diarrhea and abdominal pain for about 1 week.  The abdominal pain is located in the left side of her abdomen, moderate, aching, cramping-like pain, nonradiating.  No nausea and vomiting.  No fever or chills.  She noticed darker colored stool sometimes.  Patient does not have chest pain, cough, shortness breath.  No symptoms of UTI.  02/14: Patient is seen and examined at the bedside and appears to be in severe pain.  Son-in-law is at the bedside and both are in agreement for comfort measures   Consults Palliative care Surgery   Assessment and Plan:  Perforation of sigmoid colon due to diverticulitis: CT showed complex perforated lower sigmoid colon diverticulitis with large complex intramural and pericolonic abscess. Pt has WBC 17.4. no fever, does not meets criteria for sepsis. Was started on IV Zosyn Seen by surgeon, noted to have complex diverticulitis with contained perforation and phlegmon.  Also noted to have air in the vagina suspicious for colovaginal fistula. Surgeon discussed with the patient about the need for emergent surgery for a Hartman's procedure which patient has declined and has opted to be kept comfortable and probably discharged on hospice. Patient placed on comfort measures at this time      Iron deficiency anemia due to chronic blood loss:  Hgb stable    HTN (hypertension), benign Hold all antihypertensive medications for now    Chronic obstructive pulmonary disease ([place]): Stable -Bronchodilators   Depression -Continue home medications   History of recurrent head and neck cancer Status post chemo and radiation therapy   Hyponatremia Most likely secondary to poor oral intake       Physical Exam: Vitals:   02/22/22 0800 02/22/22 1100 02/22/22 1200 02/22/22 1400  BP: (!) 160/63 (!) 158/66 (!) 161/59 (!) 177/81  Pulse: 81 83 80 80  Resp: 14 16  20  $ Temp: 97.8 F (36.6 C) 98 F (36.7 C)  97.8 F (36.6 C)  TempSrc:      SpO2: 94% 95% 95% 94%  Weight:       Physical Exam Vitals and nursing note reviewed.  Constitutional:      Appearance: Normal appearance.     Comments: Hard of hearing  HENT:     Head: Normocephalic.     Nose: Nose normal.     Mouth/Throat:     Mouth: Mucous membranes are dry.  Eyes:     Conjunctiva/sclera: Conjunctivae normal.  Cardiovascular:     Rate and Rhythm: Normal rate and regular rhythm.  Pulmonary:     Effort: Pulmonary effort is normal.     Breath sounds: Normal breath sounds.  Abdominal:     General: Bowel sounds are normal.     Palpations: Abdomen is soft.     Tenderness: There is abdominal tenderness.     Comments: Distended abdomen mostly in the periumbilical and lower abdominal area, diffusely tender  Musculoskeletal:        General: Normal range of motion.     Cervical back: Normal range of motion and  neck supple.  Skin:    General: Skin is warm and dry.  Neurological:     Mental Status: She is alert.     Motor: Weakness present.  Psychiatric:        Mood and Affect: Mood normal.        Behavior: Behavior normal.     Data Reviewed: Labs reviewed.  White count 7.8 compared to 17.4 on admission, sodium 131 There are no new results to review at this time.  Family Communication: Discussed treatment plan with patient's healthcare power of attorney Mr [name] at the bedside.  All questions and concerns have been addressed.  He verbalizes  understanding and agrees with placing the patient on comfort measures.  Disposition: Status is: Inpatient Remains inpatient appropriate because: Pain control.  Evaluation for inpatient hospice  Planned Discharge Destination: Inpatient hospice    Time spent: 35  minutes  Author: Collier Bullock, MD 02/22/2022 4:53 PM  For on call review [email or web address].

## 2022-02-22 NOTE — ED Notes (Signed)
Patient and Dr. Dahlia Byes discussed end of life care and palliative care. Patient voiced her wishes of Palliative care.

## 2022-02-22 NOTE — ED Notes (Signed)
Pat

## 2022-02-22 NOTE — ED Notes (Signed)
Dr. Dahlia Byes in room with patient to explain benefits and risks of having surgery.

## 2022-02-22 NOTE — Progress Notes (Signed)
[facility] ED [facility]) Hospice hospital liaison note  Received request from MD for family interest in hospice home.   Chart under review and hospice home eligibility is Pending at this time. Will plan to meet with/assess patient Thursday morning as unfortunately hospice home does not have a bed to offer today.   TOC is aware hospital liaison will follow up tomorrow or sooner if room becomes available.   Please do not hesitate to call with any hospice related questions or concerns.   Thank you for the opportunity to participate in this patient's care.  [name], Therapist, sports, [facility] Liaison  [phone]

## 2022-02-22 NOTE — ED Notes (Signed)
Patient requested I call son-in-law so he can visit. Son-in-law is on the way to visit.

## 2022-02-22 NOTE — ED Notes (Signed)
Report given to Mary, RN.

## 2022-02-22 NOTE — Consult Note (Signed)
Patient ID: [name] [name], female   DOB: 1925/06/13, 89 y.o.   MRN: [id number]  HPI [name] [name] is a 89 y.o. female in consultation at the request of Dr. 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  She has had a very rough time to last year or so.  Head and neck cancer has recurred and she has been on chemo with multiple side effects.  She does have chronic pain in the head and neck.  She is tired.  She had a prolonged hospital stay recently.  She now comes in with aminal pain and diarrhea.  Pain is severe located in the left lower quadrant sharp.  Nuys any fevers and chills.  She did have a CT scan that I have personally reviewed showing evidence of complex diverticulitis with contained perforation and phlegmon.  She also has air in the vagina suspicious for colovaginal fistula. BC is 17.8, hemoglobin of 9.4.  MP shows a normal creatinine and sodium of 131. Also had an MRCP that I personally reviewed showing no evidence of masses or choledocholithiasis.  HPI  Past Medical History:  Diagnosis Date   Actinic keratosis    Anemia    COPD (chronic obstructive pulmonary disease) (HCC)    Diverticulitis    GERD (gastroesophageal reflux disease)    Glaucoma    HLD (hyperlipidemia)    Hypertension    Iron deficiency    Rectal bleeding    Skin cancer    Nose, txted by Dr. Valere Dross at [facility]    Past Surgical History:  Procedure Laterality Date   ABDOMINAL HYSTERECTOMY     APPENDECTOMY     COLONOSCOPY WITH PROPOFOL N/A 04/14/2016   Procedure: COLONOSCOPY WITH PROPOFOL;  Surgeon: Lucilla Lame, MD;  Location: [facility] ENDOSCOPY;  Service: Endoscopy;  Laterality: N/A;   ENTEROSCOPY N/A 05/10/2016   Procedure: Push ENTEROSCOPY with pediatric colonoscope;  Surgeon: [name], MD;  Location: [facility] ENDOSCOPY;  Service: Endoscopy;  Laterality: N/A;   ENTEROSCOPY N/A 05/30/2016    Procedure: push ENTEROSCOPY;  Surgeon: [name], MD;  Location: [facility] ENDOSCOPY;  Service: Endoscopy;  Laterality: N/A;   ENTEROSCOPY N/A 07/05/2017   Procedure: ENTEROSCOPY;  Surgeon: [name], MD;  Location: [facility] ENDOSCOPY;  Service: Gastroenterology;  Laterality: N/A;   ENTEROSCOPY N/A 06/21/2018   Procedure: ENTEROSCOPY;  Surgeon: [name], MD;  Location: [facility] ENDOSCOPY;  Service: Gastroenterology;  Laterality: N/A;   ESOPHAGOGASTRODUODENOSCOPY (EGD) WITH PROPOFOL N/A 08/12/2015   Procedure: ESOPHAGOGASTRODUODENOSCOPY (EGD) WITH PROPOFOL;  Surgeon: Lollie Sails, MD;  Location: [facility] ENDOSCOPY;  Service: Endoscopy;  Laterality: N/A;   ESOPHAGOGASTRODUODENOSCOPY (EGD) WITH PROPOFOL N/A 04/03/2016   Procedure: ESOPHAGOGASTRODUODENOSCOPY (EGD) WITH PROPOFOL;  Surgeon: Lucilla Lame, MD;  Location: [facility] ENDOSCOPY;  Service: Endoscopy;  Laterality: N/A;   GIVENS CAPSULE STUDY N/A 04/28/2016   Procedure: GIVENS CAPSULE STUDY;  Surgeon: [name], MD;  Location: [facility] ENDOSCOPY;  Service: Endoscopy;  Laterality: N/A;   GIVENS CAPSULE STUDY N/A 06/21/2017   Procedure: GIVENS CAPSULE STUDY;  Surgeon: [name], MD;  Location: [facility] ENDOSCOPY;  Service: Gastroenterology;  Laterality: N/A;   JOINT REPLACEMENT      Family History  Problem Relation Age of Onset   CAD Mother    CAD Father    CAD Brother     Social History Social History   Tobacco Use   Smoking status: Former    Packs/day: 2.00  Years: 20.00    Total pack years: 40.00    Types: Cigarettes    Quit date: 04/22/1979    Years since quitting: 42.8   Smokeless tobacco: Never  Vaping Use   Vaping Use: Never used  Substance Use Topics   Alcohol use: Not Currently    Alcohol/week: 1.0 standard drink of alcohol    Types: 1 Glasses of wine per week   Drug use: No    Allergies  Allergen Reactions   Nsaids Hives   Dextrans Hives   Fentanyl Itching   Lipitor [Atorvastatin] Other (See Comments)    Reaction: Muscle pain    Lovastatin    Mobic [Meloxicam] Other (See Comments)    Reaction: Mouth and tongue ulcers   Niacin And Related Itching   Other    Polysaccharide K Hives   Pravachol [Pravastatin Sodium] Other (See Comments)    Reaction: Muscle pain   Statins Other (See Comments)   Tolmetin Hives   Voltaren [Diclofenac Sodium] Other (See Comments)    Reaction: Mouth and tongue ulcers   Zetia [Ezetimibe] Other (See Comments)    Reaction: Leg pain   Codeine Nausea And Vomiting   Niacin Itching    Current Facility-Administered Medications  Medication Dose Route Frequency Provider Last Rate Last Admin   0.9 %  sodium chloride infusion   Intravenous Continuous Ivor Costa, MD 75 mL/hr at 02/21/22 1940 New Bag at 02/21/22 1940   acetaminophen (TYLENOL) tablet 650 mg  650 mg Oral Q6H PRN Ivor Costa, MD       acidophilus (RISAQUAD) capsule 1 capsule  1 capsule Oral BID Ivor Costa, MD       albuterol (PROVENTIL) (2.5 MG/3ML) 0.083% nebulizer solution 2.5 mg  2.5 mg Inhalation Q4H PRN Ivor Costa, MD       cyanocobalamin (VITAMIN B12) tablet 2,000 mcg  2,000 mcg Oral Daily Ivor Costa, MD       dextromethorphan-guaiFENesin ([place] DM) 30-600 MG per 12 hr tablet 1 tablet  1 tablet Oral BID PRN Ivor Costa, MD       DULoxetine (CYMBALTA) DR capsule 30 mg  30 mg Oral Daily Ivor Costa, MD       fluticasone (FLONASE) 50 MCG/ACT nasal spray 1 spray  1 spray Each Nare Daily PRN Ivor Costa, MD       gabapentin (NEURONTIN) capsule 300 mg  300 mg Oral TID Ivor Costa, MD       hydrALAZINE (APRESOLINE) injection 5 mg  5 mg Intravenous Q2H PRN Ivor Costa, MD       latanoprost (XALATAN) 0.005 % ophthalmic solution 1 drop  1 drop Both Eyes QHS Ivor Costa, MD   1 drop at 02/21/22 2228   loratadine (CLARITIN) tablet 10 mg  10 mg Oral QHS Ivor Costa, MD       losartan (COZAAR) tablet 50 mg  50 mg Oral Daily Ivor Costa, MD       morphine (PF) 2 MG/ML injection 0.5 mg  0.5 mg Intravenous Q3H PRN Ivor Costa, MD   0.5 mg at 02/22/22  [id number]   morphine (PF) 2 MG/ML injection 2 mg  2 mg Intravenous Once Agbata, Tochukwu, MD       morphine (PF) 4 MG/ML injection 4 mg  4 mg Intravenous Q1H PRN Josslin Sanjuan F, MD       multivitamin with minerals tablet 1 tablet  1 tablet Oral Daily Darrick Penna, RPH       ondansetron [facility]) injection 4 mg  4 mg Intravenous Q8H PRN Ivor Costa, MD   4 mg at 02/21/22 1936   oxyCODONE-acetaminophen (PERCOCET/ROXICET) 5-325 MG per tablet 1 tablet  1 tablet Oral Q4H PRN Ivor Costa, MD       pantoprazole (PROTONIX) EC tablet 40 mg  40 mg Oral Daily Ivor Costa, MD       piperacillin-tazobactam (ZOSYN) IVPB 3.375 g  3.375 g Intravenous Q8H Dorothe Pea, RPH 12.5 mL/hr at 02/22/22 0825 3.375 g at 02/22/22 0825   potassium chloride (KLOR-CON) packet 40 mEq  40 mEq Oral Once Ivor Costa, MD       Current Outpatient Medications  Medication Sig Dispense Refill   acetaminophen (TYLENOL) 500 MG tablet Take 500-1,000 mg by mouth every 6 (six) hours as needed for mild pain, moderate pain or fever.     aluminum-magnesium hydroxide 200-200 MG/5ML suspension Take 30 mLs by mouth every 4 (four) hours as needed (For mouth ulcers). Mix with Lidocaine viscous 2 %     bisacodyl (DULCOLAX) 5 MG EC tablet Take 1 tablet (5 mg total) by mouth daily as needed for moderate constipation. 30 tablet 0   Cyanocobalamin (B-12) 1000 MCG CAPS Take 2,000 Units by mouth daily.      DULoxetine (CYMBALTA) 30 MG capsule Take 1 capsule (30 mg total) by mouth daily.  3   fluticasone (FLONASE) 50 MCG/ACT nasal spray Place 1 spray into both nostrils daily.     gabapentin (NEURONTIN) 300 MG capsule Take 1 capsule (300 mg total) by mouth 3 (three) times daily. 90 capsule 0   hydrochlorothiazide (HYDRODIURIL) 12.5 MG tablet Take 12.5 mg by mouth daily.     latanoprost (XALATAN) 0.005 % ophthalmic solution Place 1 drop into both eyes at bedtime.     lidocaine (XYLOCAINE) 2 % solution Use as directed in the mouth or throat. Mix with Maalox  30 mL and allow patient to apply to affected areas in mouth every 4 hours as needed for mouth ulcers     lidocaine-prilocaine (EMLA) cream Apply topically every 4 (four) hours as needed (skin pain). 30 g 0   loratadine (CLARITIN) 10 MG tablet Take 1 tablet (10 mg total) by mouth at bedtime. 30 tablet 11   losartan (COZAAR) 50 MG tablet Take 50 mg by mouth daily.     mometasone (ELOCON) 0.1 % lotion Apply 1 application topically daily as needed (skin irritation).     Multiple Vitamins-Minerals (PRESERVISION AREDS 2) CAPS Take 1 capsule by mouth 2 (two) times daily.     polyethylene glycol (MIRALAX / GLYCOLAX) 17 g packet Take 17 g by mouth daily.     PREVIDENT 5000 PLUS 1.1 % CREA dental cream Place 1 Application onto teeth daily.     RABEprazole (ACIPHEX) 20 MG tablet Take 20 mg by mouth 2 (two) times daily.     Saccharomyces boulardii (PROBIOTIC) 250 MG CAPS Take 1 capsule by mouth 2 (two) times daily.     timolol (TIMOPTIC) 0.5 % ophthalmic solution Place 1 drop into both eyes daily.     lactose free nutrition (BOOST) LIQD Take 237 mLs by mouth daily.     methocarbamol (ROBAXIN) 500 MG tablet Take 0.5 tablets (250 mg total) by mouth 2 (two) times daily. 30 tablet 5   omeprazole (PRILOSEC) 20 MG capsule Take 20 mg by mouth in the morning and at bedtime. (Patient not taking: Reported on 02/21/2022)     oxyCODONE (OXYCONTIN) 10 mg 12 hr tablet Take 10 mg by mouth at  bedtime. At bedtime for 1 week     senna (SENOKOT) 8.6 MG tablet Take 2 tablets by mouth daily.     Facility-Administered Medications Ordered in Other Encounters  Medication Dose Route Frequency Provider Last Rate Last Admin   0.9 %  sodium chloride infusion   Intravenous Continuous Sindy Guadeloupe, MD 0 mL/hr at 10/08/17 1518 New Bag at 06/21/18 [id number]     Review of Systems Full ROS  was asked and was negative except for the information on the HPI  Physical Exam Blood pressure (!) 160/63, pulse 81, temperature 97.8 F (36.6 C),  resp. rate 14, weight 63.5 kg, SpO2 94 %. CONSTITUTIONAL: SHe is malnourished, elderly and fragile. EYES: Pupils are equal, round, Sclera are non-icteric. EARS, NOSE, MOUTH AND THROAT: The oropharynx is clear.  Evidence of thickening and induration on the left buccal issue.  It is tender to palpation The oral mucosa is pink and moist.  SHe is very hard of hearing LYMPH NODES:  Lymph nodes in the neck are normal. RESPIRATORY:  Lungs are clear. There is normal respiratory effort, with equal breath sounds bilaterally, and without pathologic use of accessory muscles. CARDIOVASCULAR: Heart is regular without murmurs, gallops, or rubs. GI: The abdomen is soft, tenderness in the left lower quadrant with  peritonitis.  There is no hepatosplenomegaly. There are normal bowel sounds GU: Rectal deferred.   NEUROLOGIC: Motor and sensation is grossly normal. Cranial nerves are grossly intact. PSYCH:  Oriented to person, place and time. Affect is normal.  Data Reviewed  I have personally reviewed the patient's imaging, laboratory findings and medical records.    Assessment/Plan 89 year old female with recurrent head and neck cancer comes in with complex perforated diverticulitis.  She definitely has an acute abdomen.  I had an extensive discussion with her.  She is very competent she seems to be suffering for the last year mainly due to the head and neck cancer.  Discussed with her about options of proceeding emergently to the operating room and do a drastic Hartman's procedure.  N95 this is a major undertaking.  Other options is to do comfort measures and seek palliative care.  At this point the patient is very tired, she is miserable she states that she does not want to keep living like this and she rather have hospice.  I do think that is the right thing to do as the patient has been suffering for a while from the head and neck cancer.  I do think that doing a major surgical intervention will probably add to  her pain and suffering. I have discussed in detail the situation with the patient and also the hospitalist Dr. Francine Graven.  We will make sure that she is in comfort measures.  Will also involve palliative care.  At this point not much to offer from a surgical standpoint.  Please note that I spent greater than 75 minutes in this encounter including personally reviewing imaging studies, coordinating her care, placing orders, counseling the patient about end-of-life issues.  Caroleen Hamman, MD FACS General Surgeon 02/22/2022, 8:41 AM

## 2022-02-23 LAB — BASIC METABOLIC PANEL
Anion gap: 10 (ref 5–15)
BUN: 8 mg/dL (ref 8–23)
CO2: 25 mmol/L (ref 22–32)
Calcium: 8.5 mg/dL — ABNORMAL LOW (ref 8.9–10.3)
Chloride: 97 mmol/L — ABNORMAL LOW (ref 98–111)
Creatinine, Ser: 0.45 mg/dL (ref 0.44–1.00)
GFR, Estimated: >60 mL/min (ref >60)
Glucose, Bld: 94 mg/dL (ref 70–99)
Potassium: 2.9 mmol/L — ABNORMAL LOW (ref 3.5–5.1)
Sodium: 132 mmol/L — ABNORMAL LOW (ref 135–145)

## 2022-02-23 LAB — CBC
HCT: 32.4 % — ABNORMAL LOW (ref 36.0–46.0)
Hemoglobin: 10.6 g/dL — ABNORMAL LOW (ref 12.0–15.0)
MCH: 26.2 pg (ref 26.0–34.0)
MCHC: 32.7 g/dL (ref 30.0–36.0)
MCV: 80 fL (ref 80.0–100.0)
Platelets: 575 10*3/uL — ABNORMAL HIGH (ref 150–400)
RBC: 4.05 MIL/uL (ref 3.87–5.11)
RDW: 15.8 % — ABNORMAL HIGH (ref 11.5–15.5)
WBC: 17.5 10*3/uL — ABNORMAL HIGH (ref 4.0–10.5)
nRBC: 0 % (ref 0.0–0.2)

## 2022-02-23 MED ORDER — MORPHINE SULFATE (PF) 2 MG/ML IV SOLN
1.0000 mg | INTRAVENOUS | Status: DC | PRN
  Administered 2022-02-23 (×5): 1 mg via INTRAVENOUS
  Filled 2022-02-23 (×5): qty 1

## 2022-02-24 ENCOUNTER — Inpatient Hospital Stay: Payer: Medicare Other

## 2022-02-24 ENCOUNTER — Ambulatory Visit: Payer: Medicare Other | Admitting: Podiatry

## 2022-02-25 LAB — TYPE AND SCREEN
ABO/RH(D): O NEG
Antibody Screen: POSITIVE
Unit division: 0
Unit division: 0

## 2022-02-25 LAB — BPAM RBC
Blood Product Expiration Date: 202402292359
Blood Product Expiration Date: 202403022359
Unit Type and Rh: 9500
Unit Type and Rh: 9500

## 2022-02-26 LAB — CULTURE, BLOOD (ROUTINE X 2)
Culture: NO GROWTH
Culture: NO GROWTH
Special Requests: ADEQUATE

## 2022-03-10 NOTE — TOC Transition Note (Signed)
Transition of Care [facility]) - CM/SW Discharge Note   Patient Details  Name: [name] [name] MRN: [id number] Date of Birth: 1925-10-11  [facility] of [facility] CM/SW Contact:  Candie Chroman, LCSW Phone Number: 02/23/2022, 2:32 PM   Clinical Narrative:  Patient has orders to discharge to the [facility] today. RN will call report to [phone]. [facility] liaison will set up EMS transport and said they can accept her anytime after 6:30. No further concerns. CSW signing off.   Final next level of care: [place] Barriers to Discharge: Barriers Resolved   Patient Goals and CMS Choice      Discharge Placement                  Patient to be transferred to facility by: EMS   Patient and family notified of of transfer: 02/23/22  Discharge Plan and Services Additional resources added to the After Visit Summary for       Post Acute Care Choice: Hospice                               Social Determinants of Health (SDOH) Interventions SDOH Screenings   Food Insecurity: No Food Insecurity (02/22/2022)  Housing: Low Risk  (02/22/2022)  Transportation Needs: No Transportation Needs (02/22/2022)  Utilities: Not At Risk (02/22/2022)  Depression (PHQ2-9): Medium Risk (06/14/2020)  Tobacco Use: Medium Risk (02/22/2022)     Readmission Risk Interventions     No data to display

## 2022-03-10 NOTE — Discharge Summary (Signed)
Physician Discharge Summary   Patient: [name] [name] MRN: [id number] DOB: 1925/03/10  Admit date:     02/21/2022  Discharge date: 02/23/22  Discharge Physician: Oran Rein   PCP: Dewayne Shorter, MD   Recommendations at discharge:    Going to hospice home  Discharge Diagnoses: Principal Problem:   Perforation of sigmoid colon due to diverticulitis Active Problems:   Dilated bile duct   Iron deficiency anemia due to chronic blood loss   HTN (hypertension), benign   Chronic obstructive pulmonary disease ([place])   Depression  Resolved Problems:   * No resolved hospital problems. *  Hospital Course: [name] [name] is a 89 y.o. 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   Patient states that she has intermittent diarrhea and abdominal pain for about 1 week.  The abdominal pain is located in the left side of her abdomen, moderate, aching, cramping-like pain, nonradiating.  No nausea and vomiting.  No fever or chills.  She noticed darker colored stool sometimes.  Patient does not have chest pain, cough, shortness breath.  No symptoms of UTI.   02/14: Patient is seen and examined at the bedside and appears to be in severe pain.  Son-in-law is at the bedside and both are in agreement for comfort measures   Assessment and Plan: Perforation of sigmoid colon due to diverticulitis: CT showed complex perforated lower sigmoid colon diverticulitis with large complex intramural and pericolonic abscess. Pt has WBC 17.4. no fever, does not meets criteria for sepsis. Was started on IV Zosyn Seen by surgeon, noted to have complex diverticulitis with contained perforation and phlegmon.  Also noted to have air in the vagina suspicious for colovaginal  fistula. Surgeon discussed with the patient about the need for emergent surgery for a Hartman's procedure which patient has declined and has opted to be kept comfortable and probably discharged to hospice. Patient placed on comfort measures at this time   Iron deficiency anemia due to chronic blood loss:  Hgb stable   HTN (hypertension), benign Hold all antihypertensive medications for now   Chronic obstructive pulmonary disease ([place]): Stable -Bronchodilators   Depression -Continue home medications   History of recurrent head and neck cancer Status post chemo and radiation therapy   Hyponatremia Most likely secondary to poor oral intake     Pain control - [place] Controlled Substance Reporting System database was reviewed. and patient was instructed, not to drive, operate heavy machinery, perform activities at heights, swimming or participation in water activities or provide baby-sitting services while on Pain, Sleep and Anxiety Medications; until their outpatient Physician has advised to do so again. Also recommended to not to take more than prescribed Pain, Sleep and Anxiety Medications.  Consultant : Palliative care and Surgery.   Disposition: Hospice care Diet recommendation:  Discharge Diet Orders (From admission, onward)     Start     Ordered   02/23/22 0000  Diet - low sodium heart healthy        02/23/22 1353           Regular diet DISCHARGE MEDICATION: Allergies as of 02/23/2022       Reactions   Nsaids Hives   Dextrans Hives   Fentanyl Itching   Lipitor [atorvastatin] Other (See Comments)   Reaction: Muscle pain   Lovastatin  Mobic [meloxicam] Other (See Comments)   Reaction: Mouth and tongue ulcers   Niacin And Related Itching   Other    Polysaccharide K Hives   Pravachol [pravastatin Sodium] Other (See Comments)   Reaction: Muscle pain   Statins Other (See Comments)   Tolmetin Hives   Voltaren [diclofenac Sodium] Other (See Comments)    Reaction: Mouth and tongue ulcers   Zetia [ezetimibe] Other (See Comments)   Reaction: Leg pain   Codeine Nausea And Vomiting   Niacin Itching        Medication List     STOP taking these medications    methocarbamol 500 MG tablet Commonly known as: ROBAXIN   oxyCODONE 10 mg 12 hr tablet Commonly known as: OXYCONTIN   senna 8.6 MG tablet Commonly known as: SENOKOT       TAKE these medications    acetaminophen 500 MG tablet Commonly known as: TYLENOL Take 500-1,000 mg by mouth every 6 (six) hours as needed for mild pain, moderate pain or fever.   aluminum-magnesium hydroxide 200-200 MG/5ML suspension Take 30 mLs by mouth every 4 (four) hours as needed (For mouth ulcers). Mix with Lidocaine viscous 2 %   B-12 1000 MCG Caps Take 2,000 Units by mouth daily.   bisacodyl 5 MG EC tablet Commonly known as: DULCOLAX Take 1 tablet (5 mg total) by mouth daily as needed for moderate constipation.   DULoxetine 30 MG capsule Commonly known as: Cymbalta Take 1 capsule (30 mg total) by mouth daily.   fluticasone 50 MCG/ACT nasal spray Commonly known as: FLONASE Place 1 spray into both nostrils daily.   gabapentin 300 MG capsule Commonly known as: NEURONTIN Take 1 capsule (300 mg total) by mouth 3 (three) times daily.   hydrochlorothiazide 12.5 MG tablet Commonly known as: HYDRODIURIL Take 12.5 mg by mouth daily.   lactose free nutrition Liqd Take 237 mLs by mouth daily.   latanoprost 0.005 % ophthalmic solution Commonly known as: XALATAN Place 1 drop into both eyes at bedtime.   lidocaine 2 % solution Commonly known as: XYLOCAINE Use as directed in the mouth or throat. Mix with Maalox 30 mL and allow patient to apply to affected areas in mouth every 4 hours as needed for mouth ulcers   lidocaine-prilocaine cream Commonly known as: EMLA Apply topically every 4 (four) hours as needed (skin pain).   loratadine 10 MG tablet Commonly known as: CLARITIN Take 1  tablet (10 mg total) by mouth at bedtime.   losartan 50 MG tablet Commonly known as: COZAAR Take 50 mg by mouth daily.   mometasone 0.1 % lotion Commonly known as: ELOCON Apply 1 application topically daily as needed (skin irritation).   omeprazole 20 MG capsule Commonly known as: PRILOSEC Take 20 mg by mouth in the morning and at bedtime.   polyethylene glycol 17 g packet Commonly known as: MIRALAX / GLYCOLAX Take 17 g by mouth daily.   PreserVision AREDS 2 Caps Take 1 capsule by mouth 2 (two) times daily.   PreviDent 5000 Plus 1.1 % Crea dental cream Generic drug: sodium fluoride Place 1 Application onto teeth daily.   Probiotic 250 MG Caps Take 1 capsule by mouth 2 (two) times daily.   RABEprazole 20 MG tablet Commonly known as: ACIPHEX Take 20 mg by mouth 2 (two) times daily.   timolol 0.5 % ophthalmic solution Commonly known as: TIMOPTIC Place 1 drop into both eyes daily.        Discharge Exam: [employer]  02/21/22 1814  Weight: 63.5 kg     Condition at discharge: Persistent condition hospice d/c  The results of significant diagnostics from this hospitalization (including imaging, microbiology, ancillary and laboratory) are listed below for reference.   Imaging Studies: MR ABDOMEN MRCP W WO CONTAST  Result Date: 02/22/2022 CLINICAL DATA:  Right upper quadrant abdominal pain. Evaluate for biliary disease. EXAM: MRI ABDOMEN WITHOUT AND WITH CONTRAST (INCLUDING MRCP) TECHNIQUE: Multiplanar multisequence MR imaging of the abdomen was performed both before and after the administration of intravenous contrast. Heavily T2-weighted images of the biliary and pancreatic ducts were obtained, and three-dimensional MRCP images were rendered by post processing. CONTRAST:  31m GADAVIST GADOBUTROL 1 MMOL/ML IV SOLN COMPARISON:  CT AP 02/21/2022 FINDINGS: Lower chest: No acute findings. Hepatobiliary: No enhancing liver lesion identified. Central left lobe of liver cyst  measures 1.9 cm, image 16/6. There are several subcentimeter, nonenhancing T2 hyperintensities which although too small to reliably characterize likely represent small cysts. No gallstones identified. No significant gallbladder wall thickening or pericholecystic inflammation. Mild intrahepatic bile duct dilatation. Mild fusiform dilatation of the common bile duct measures up to 8 mm, image 16/2. No signs of choledocholithiasis. Pancreas: The main duct appears mildly increased in caliber measuring up to 3 mm at the level of the head of pancreas. No mass, inflammatory changes, or other parenchymal abnormality identified. Spleen:  Within normal limits in size and appearance. Adrenals/Urinary Tract: Normal adrenal glands. Small bilateral simple appearing kidney cysts. No follow-up imaging recommended. No hydronephrosis. Stomach/Bowel: No pathologic dilatation of the bowel loops. Inflammatory changes associated with sigmoid diverticulitis are suboptimally visualized on this exam. See dedicated CT abdomen and pelvis from earlier today. Vascular/Lymphatic: Aortic atherosclerosis. No aneurysm. The upper abdominal vascularity is patent. Aortocaval lymph node measures 1.1 cm, image 23/6. Other: Trace perihepatic ascites. Edema noted within the retroperitoneal fat bilaterally. Musculoskeletal: No suspicious bone lesions identified. IMPRESSION: 1. Mild intrahepatic bile duct dilatation with and fusiform dilatation of the common bile duct. No signs of choledocholithiasis or obstructing mass. 2. Mild increase caliber of the main pancreatic duct measuring up to 3 mm at the level of the head of pancreas. No mass, inflammatory changes, or other parenchymal abnormality identified. 3. Trace perihepatic ascites. 4. Inflammatory changes associated with sigmoid diverticulitis are suboptimally visualized on this exam. See dedicated CT abdomen and pelvis from earlier today. Electronically Signed   By: TKerby MoorsM.D.   On: 02/22/2022  05:20   MR 3D Recon At Scanner  Result Date: 02/22/2022 CLINICAL DATA:  Right upper quadrant abdominal pain. Evaluate for biliary disease. EXAM: MRI ABDOMEN WITHOUT AND WITH CONTRAST (INCLUDING MRCP) TECHNIQUE: Multiplanar multisequence MR imaging of the abdomen was performed both before and after the administration of intravenous contrast. Heavily T2-weighted images of the biliary and pancreatic ducts were obtained, and three-dimensional MRCP images were rendered by post processing. CONTRAST:  620mGADAVIST GADOBUTROL 1 MMOL/ML IV SOLN COMPARISON:  CT AP 02/21/2022 FINDINGS: Lower chest: No acute findings. Hepatobiliary: No enhancing liver lesion identified. Central left lobe of liver cyst measures 1.9 cm, image 16/6. There are several subcentimeter, nonenhancing T2 hyperintensities which although too small to reliably characterize likely represent small cysts. No gallstones identified. No significant gallbladder wall thickening or pericholecystic inflammation. Mild intrahepatic bile duct dilatation. Mild fusiform dilatation of the common bile duct measures up to 8 mm, image 16/2. No signs of choledocholithiasis. Pancreas: The main duct appears mildly increased in caliber measuring up to 3 mm at the level of the head  of pancreas. No mass, inflammatory changes, or other parenchymal abnormality identified. Spleen:  Within normal limits in size and appearance. Adrenals/Urinary Tract: Normal adrenal glands. Small bilateral simple appearing kidney cysts. No follow-up imaging recommended. No hydronephrosis. Stomach/Bowel: No pathologic dilatation of the bowel loops. Inflammatory changes associated with sigmoid diverticulitis are suboptimally visualized on this exam. See dedicated CT abdomen and pelvis from earlier today. Vascular/Lymphatic: Aortic atherosclerosis. No aneurysm. The upper abdominal vascularity is patent. Aortocaval lymph node measures 1.1 cm, image 23/6. Other: Trace perihepatic ascites. Edema noted  within the retroperitoneal fat bilaterally. Musculoskeletal: No suspicious bone lesions identified. IMPRESSION: 1. Mild intrahepatic bile duct dilatation with and fusiform dilatation of the common bile duct. No signs of choledocholithiasis or obstructing mass. 2. Mild increase caliber of the main pancreatic duct measuring up to 3 mm at the level of the head of pancreas. No mass, inflammatory changes, or other parenchymal abnormality identified. 3. Trace perihepatic ascites. 4. Inflammatory changes associated with sigmoid diverticulitis are suboptimally visualized on this exam. See dedicated CT abdomen and pelvis from earlier today. Electronically Signed   By: Kerby Moors M.D.   On: 02/22/2022 05:20   CT Abdomen Pelvis W Contrast  Result Date: 02/21/2022 CLINICAL DATA:  Abdominal pain for 1 week. EXAM: CT ABDOMEN AND PELVIS WITH CONTRAST TECHNIQUE: Multidetector CT imaging of the abdomen and pelvis was performed using the standard protocol following bolus administration of intravenous contrast. RADIATION DOSE REDUCTION: This exam was performed according to the departmental dose-optimization program which includes automated exposure control, adjustment of the mA and/or kV according to patient size and/or use of iterative reconstruction technique. CONTRAST:  154m OMNIPAQUE IOHEXOL 300 MG/ML  SOLN COMPARISON:  Bilat in 2023 FINDINGS: Lower chest: Patchy E peripheral tree-in-bud type nodularity most notable in the lingula. Findings could be due to chronic inflammation or atypical infection such as MAC. No focal airspace consolidation or pleural effusion. The heart is normal in size. No pericardial effusion. Stable aortic and coronary artery calcifications and calcifications around the aortic valve. Hepatobiliary: Intrahepatic biliary dilatation new since prior CT scan. There is also dilatation of the common bile duct measuring up to 1 cm in the head of the pancreas. Could not exclude a distal common bile duct  stone. I do not see any evidence of a pancreatic head mass or ampullary lesion. Recommend correlation with liver function studies. ERCP or MRCP may be helpful for further evaluation. No worrisome hepatic lesions. The gallbladder is mildly distended measuring up to 5 cm in transverse dimension. No wall thickening or pericholecystic fluid and no obvious gallstones. Pancreas: Moderate age related pancreatic atrophy but no mass, inflammation or ductal dilatation. Spleen: Normal size.  No focal lesions. Adrenals/Urinary Tract: The adrenal glands are normal. No renal, ureteral or bladder calculi or mass. The bladder is partially obscured by artifact from right hip prosthesis. Stomach/Bowel: The stomach, duodenum and small bowel are unremarkable. No acute inflammatory process, mass lesions or obstructive findings. Severe sigmoid colon diverticulosis and probable perforated diverticulitis involving the lower sigmoid colon. Suspect complex intramural abscess and perirectal abscess. The remainder of the colon is unremarkable. Vascular/Lymphatic: Advanced atherosclerotic calcification involving the aorta and branch vessels but no aneurysm or dissection. The major venous structures are patent. Small scattered mesenteric and retroperitoneal lymph nodes. Reproductive: Surgically absent. Other: Moderate gas in the vagina. Could not exclude a rectovaginal fistula. Musculoskeletal: Right total hip arthroplasty. Advanced degenerative changes involving the lumbar spine and left hip. Chronic T12 compression fracture. IMPRESSION: 1. CT findings most  consistent with complex perforated lower sigmoid colon diverticulitis with large complex intramural and pericolonic abscess. 2. Large amount of gas in the vagina. Could not exclude a rectovaginal fistula. 3. Intra and extrahepatic biliary dilatation new since prior CT scan. Possible obstructing distal common bile duct stone. Recommend correlation with liver function studies. ERCP or MRCP may  be helpful for further evaluation. Electronically Signed   By: Marijo Sanes M.D.   On: 02/21/2022 16:05    Microbiology: Results for orders placed or performed during the hospital encounter of 02/21/22  Resp panel by RT-PCR (RSV, Flu A&B, Covid) Anterior Nasal Swab     Status: None   Collection Time: 02/21/22  1:12 PM   Specimen: Anterior Nasal Swab  Result Value Ref Range Status   SARS Coronavirus 2 by RT PCR NEGATIVE NEGATIVE Final    Comment: (NOTE) SARS-CoV-2 target nucleic acids are NOT DETECTED.  The SARS-CoV-2 RNA is generally detectable in upper respiratory specimens during the acute phase of infection. The lowest concentration of SARS-CoV-2 viral copies this assay can detect is 138 copies/mL. A negative result does not preclude SARS-Cov-2 infection and should not be used as the sole basis for treatment or other patient management decisions. A negative result may occur with  improper specimen collection/handling, submission of specimen other than nasopharyngeal swab, presence of viral mutation(s) within the areas targeted by this assay, and inadequate number of viral copies(<138 copies/mL). A negative result must be combined with clinical observations, patient history, and epidemiological information. The expected result is Negative.  Fact Sheet for Patients:  EntrepreneurPulse.com.au  Fact Sheet for Healthcare Providers:  IncredibleEmployment.be  This test is no t yet approved or cleared by the [place] FDA and  has been authorized for detection and/or diagnosis of SARS-CoV-2 by FDA under an Emergency Use Authorization (EUA). This EUA will remain  in effect (meaning this test can be used) for the duration of the COVID-19 declaration under Section 564(b)(1) of the Act, 21 U.S.C.section 360bbb-3(b)(1), unless the authorization is terminated  or revoked sooner.       Influenza A by PCR NEGATIVE NEGATIVE Final   Influenza B by  PCR NEGATIVE NEGATIVE Final    Comment: (NOTE) The Xpert Xpress SARS-CoV-2/FLU/RSV plus assay is intended as an aid in the diagnosis of influenza from Nasopharyngeal swab specimens and should not be used as a sole basis for treatment. Nasal washings and aspirates are unacceptable for Xpert Xpress SARS-CoV-2/FLU/RSV testing.  Fact Sheet for Patients: EntrepreneurPulse.com.au  Fact Sheet for Healthcare Providers: IncredibleEmployment.be  This test is not yet approved or cleared by the [place] FDA and has been authorized for detection and/or diagnosis of SARS-CoV-2 by FDA under an Emergency Use Authorization (EUA). This EUA will remain in effect (meaning this test can be used) for the duration of the COVID-19 declaration under Section 564(b)(1) of the Act, 21 U.S.C. section 360bbb-3(b)(1), unless the authorization is terminated or revoked.     Resp Syncytial Virus by PCR NEGATIVE NEGATIVE Final    Comment: (NOTE) Fact Sheet for Patients: EntrepreneurPulse.com.au  Fact Sheet for Healthcare Providers: IncredibleEmployment.be  This test is not yet approved or cleared by the [place] FDA and has been authorized for detection and/or diagnosis of SARS-CoV-2 by FDA under an Emergency Use Authorization (EUA). This EUA will remain in effect (meaning this test can be used) for the duration of the COVID-19 declaration under Section 564(b)(1) of the Act, 21 U.S.C. section 360bbb-3(b)(1), unless the authorization is terminated or revoked.  Performed  at [facility], [address] [place], [place] [address]   Culture, blood (Routine X 2) w Reflex to ID Panel     Status: None (Preliminary result)   Collection Time: 02/21/22  5:58 PM   Specimen: BLOOD  Result Value Ref Range Status   Specimen Description BLOOD RIGHT ANTECUBITAL  Final   Special Requests   Final    BOTTLES DRAWN AEROBIC AND  ANAEROBIC Blood Culture results may not be optimal due to an excessive volume of blood received in culture bottles   Culture   Final    NO GROWTH 2 DAYS Performed at [facility], [address] [place], [place] [address]    Report Status PENDING  Incomplete  Culture, blood (Routine X 2) w Reflex to ID Panel     Status: None (Preliminary result)   Collection Time: 02/21/22  5:58 PM   Specimen: BLOOD  Result Value Ref Range Status   Specimen Description BLOOD BLOOD RIGHT FOREARM  Final   Special Requests   Final    BOTTLES DRAWN AEROBIC AND ANAEROBIC Blood Culture adequate volume   Culture   Final    NO GROWTH 2 DAYS Performed at [facility], [address] [place], [place] [address]    Report Status PENDING  Incomplete    Labs: CBC: Recent Labs  Lab 02/21/22 1312 02/22/22 0426 02/23/22 0931  WBC 17.4* 17.8* 17.5*  NEUTROABS 14.4*  --   --   HGB 9.9* 9.4* 10.6*  HCT 30.2* 28.8* 32.4*  MCV 81.6 80.9 80.0  PLT 544* 498* [id number]*   Basic Metabolic Panel: Recent Labs  Lab 02/21/22 1312 02/21/22 1543 02/22/22 0426 02/23/22 0931  NA 131*  --  131* 132*  K 3.4*  --  4.3 2.9*  CL 93*  --  96* 97*  CO2 27  --  28 25  GLUCOSE 101*  --  100* 94  BUN 11  --  9 8  CREATININE 0.52  --  0.55 0.45  CALCIUM 8.8*  --  8.5* 8.5*  MG  --  1.9  --   --    Liver Function Tests: Recent Labs  Lab 02/21/22 1312  AST 14*  ALT 8  ALKPHOS 66  BILITOT 0.5  PROT 7.2  ALBUMIN 3.0*   CBG: Recent Labs  Lab 02/22/22 0723  GLUCAP 88    Discharge time spent: greater than 30 minutes.  Signed: Oran Rein, MD [facility] 02/23/2022

## 2022-03-10 NOTE — Progress Notes (Signed)
[facility]) Hospice hospital liaison note   Referral received for Hospice Home. Met with patient and family to discuss services and hospice philosophy.    Hospice home is able to accept patient this afternoon after 6:30 and transport has been called. .    RN staff, you may call report at any time to [phone], room is assigned when report is called.  Please leave IV intact.    Please send completed DNR with patient.   Updated attending and [facility] manager via [place]. Thank you for the opportunity to participate in this patient's care [name], BSN, RN Hospice nurse liaison [phone]

## 2022-03-10 NOTE — Plan of Care (Signed)
Consult noted for [place]. Upon chart review, it appears goals are set as patient has been evaluated by [facility], and patient is waiting for hospice facility placement. Given patient's diagnoses and acute on chronic pain, this would be a very reasonable plan. Would recommend titrating PRN IV pain medication as needed for symptom management. PMT will sign off as goals are set. Please reconsult if needs arise.

## 2022-03-10 NOTE — TOC Initial Note (Signed)
Transition of Care [facility]) - Initial/Assessment Note    Patient Details  Name: [name] [name] MRN: [id number] Date of Birth: 10/29/1925  Transition of [facility]) CM/SW Contact:    Candie Chroman, LCSW Phone Number: 02/23/2022, 1:46 PM  Clinical Narrative:   CSW met with patient. No supports at bedside. CSW introduced role and inquired about interest in local hospice home. Patient confirmed. Per [facility] liaison, son-in-law is signing consents at 2:00 and they will plan to admit today. No further concerns. CSW encouraged patient to contact CSW as needed. CSW will continue to follow patient for support and facilitate discharge to the hospice home today.               Expected Discharge Plan: Hospice Medical Facility Barriers to Discharge: Other (must enter comment) (Family signing consents at 2:00)   Patient Goals and CMS Choice            Expected Discharge Plan and Services     Post Acute Care Choice: Hospice Living arrangements for the past 2 months: Single Family Home                                      Prior Living Arrangements/Services Living arrangements for the past 2 months: Single Family Home   Patient language and need for interpreter reviewed:: Yes        Need for Family Participation in Patient Care: Yes (Comment) Care giver support system in place?: Yes (comment)   Criminal Activity/Legal Involvement Pertinent to Current Situation/Hospitalization: No - Comment as needed  Activities of Daily Living Home Assistive Devices/Equipment: Walker (specify type) ADL Screening (condition at time of admission) Patient's cognitive ability adequate to safely complete daily activities?: Yes Is the patient deaf or have difficulty hearing?: No Does the patient have difficulty seeing, even when wearing glasses/contacts?: No Does the patient have difficulty concentrating, remembering, or making decisions?: No Patient able to express need for assistance with  ADLs?: Yes Does the patient have difficulty dressing or bathing?: No Independently performs ADLs?: Yes (appropriate for developmental age) Does the patient have difficulty walking or climbing stairs?: No Weakness of Legs: None Weakness of Arms/Hands: None  Permission Sought/Granted Permission sought to share information with : Facility Art therapist granted to share information with : Yes, Verbal Permission Granted     Permission granted to share info w AGENCY: [facility]        Emotional Assessment Appearance:: Appears stated age Attitude/Demeanor/Rapport: Engaged, Gracious Affect (typically observed): Accepting, Appropriate, Calm, Pleasant Orientation: : Oriented to Self, Oriented to Place, Oriented to  Time, Oriented to Situation Alcohol / Substance Use: Not Applicable Psych Involvement: No (comment)  Admission diagnosis:  Perforated bowel ([place]) [K63.1] Perforation of sigmoid colon due to diverticulitis [K57.20] Patient Active Problem List   Diagnosis Date Noted   Perforated bowel ([place]) 02/21/2022   Perforation of sigmoid colon due to diverticulitis 02/21/2022   Dilated bile duct 02/21/2022   Depression 02/21/2022   Palliative care patient 12/14/2021   Bilateral lower leg cellulitis 11/24/2021   Macular rash 11/16/2021   Cancer associated pain 10/06/2021   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   Hyponatremia 11/05/2019   Generalized weakness 11/05/2019   COPD with acute exacerbation ([place]) 11/04/2019   Use of cane as ambulatory aid 09/20/2017   Fibromyositis 06/12/2017   Pain 06/12/2017   Healthcare maintenance 01/17/2017  AVM (arteriovenous malformation) of small bowel, acquired with hemorrhage 06/02/2016   Anemia 05/30/2016   Angiodysplasia of intestinal tract    Blood in stool    Benign neoplasm of ascending colon    UGIB (upper gastrointestinal bleed)    GIB (gastrointestinal bleeding) 04/12/2016   Melena     Healthcare-associated pertussis 10/10/2015   Healthcare-associated pneumonia 10/10/2015   GI bleed 08/10/2015   Iron deficiency anemia due to chronic blood loss 08/10/2015   HTN (hypertension), benign 08/10/2015   HLD (hyperlipidemia) 08/10/2015   GERD (gastroesophageal reflux disease) 08/10/2015   Chronic obstructive pulmonary disease ([place]) 08/10/2015   Glaucoma suspect 12/26/2012   Fatigue 02/12/2012   H/O total hip arthroplasty 01/25/2012   Total knee replacement status 01/25/2012   Arthritis of knee 01/18/2012   ARMD (age related macular degeneration) 11/21/2011   Foot drop 11/15/2011   Hallux valgus 11/15/2011   Hammer toe, acquired 11/15/2011   Recurrent major depressive disorder, in partial remission ([place]) 07/10/2011   Degenerative disc disease 04/11/2011   Fibromyalgia 02/10/2011   Diverticulosis of colon 07/18/2010   Gastric ulcer 07/18/2010   Hereditary and idiopathic peripheral neuropathy 07/18/2010   Meniere's disease 07/18/2010   Osteoarthritis, generalized 07/18/2010   Hemorrhoids 07/18/2010   PCP:  Dewayne Shorter, MD Pharmacy:   [place], [place] - [place] [address] [place] [address] Phone: [phone] Fax: [phone]     Social Determinants of Health (SDOH) Social History: SDOH Screenings   Food Insecurity: No Food Insecurity (02/22/2022)  Housing: Low Risk  (02/22/2022)  Transportation Needs: No Transportation Needs (02/22/2022)  Utilities: Not At Risk (02/22/2022)  Depression (PHQ2-9): Medium Risk (06/14/2020)  Tobacco Use: Medium Risk (02/22/2022)   SDOH Interventions:     Readmission Risk Interventions     No data to display

## 2022-03-10 DEATH — deceased

## 2022-04-24 ENCOUNTER — Other Ambulatory Visit: Payer: Medicare Other

## 2022-06-26 ENCOUNTER — Ambulatory Visit: Payer: Medicare Other | Admitting: Oncology

## 2022-06-26 ENCOUNTER — Other Ambulatory Visit: Payer: Medicare Other

## 2023-01-11 NOTE — Telephone Encounter (Signed)
 Error
